# Patient Record
Sex: Female | Born: 1955 | Race: White | Hispanic: No | Marital: Single | Smoking: Never smoker
Health system: Southern US, Community
[De-identification: ages and names within clinical notes are randomized; demographics above are authoritative.]

## PROBLEM LIST (undated history)

## (undated) DIAGNOSIS — R519 Headache, unspecified: Secondary | ICD-10-CM

## (undated) DIAGNOSIS — I959 Hypotension, unspecified: Secondary | ICD-10-CM

## (undated) DIAGNOSIS — F419 Anxiety disorder, unspecified: Secondary | ICD-10-CM

## (undated) DIAGNOSIS — G8929 Other chronic pain: Secondary | ICD-10-CM

## (undated) DIAGNOSIS — K529 Noninfective gastroenteritis and colitis, unspecified: Secondary | ICD-10-CM

## (undated) DIAGNOSIS — R45851 Suicidal ideations: Secondary | ICD-10-CM

## (undated) DIAGNOSIS — R51 Headache: Secondary | ICD-10-CM

## (undated) DIAGNOSIS — G4700 Insomnia, unspecified: Secondary | ICD-10-CM

## (undated) DIAGNOSIS — M1990 Unspecified osteoarthritis, unspecified site: Secondary | ICD-10-CM

## (undated) HISTORY — DX: Noninfective gastroenteritis and colitis, unspecified: K52.9

## (undated) HISTORY — DX: Hypotension, unspecified: I95.9

## (undated) HISTORY — DX: Suicidal ideations: R45.851

## (undated) HISTORY — PX: CARPAL TUNNEL RELEASE: SHX101

## (undated) HISTORY — PX: MOUTH SURGERY: SHX715

## (undated) HISTORY — PX: LAPAROSCOPIC OOPHERECTOMY: SHX6507

## (undated) HISTORY — DX: Insomnia, unspecified: G47.00

## (undated) HISTORY — PX: BACK SURGERY: SHX140

---

## 1999-03-09 ENCOUNTER — Other Ambulatory Visit: Admission: RE | Admit: 1999-03-09 | Discharge: 1999-03-09 | Payer: Self-pay | Admitting: Gynecology

## 1999-03-25 ENCOUNTER — Other Ambulatory Visit: Admission: RE | Admit: 1999-03-25 | Discharge: 1999-03-25 | Payer: Self-pay | Admitting: Gynecology

## 1999-06-03 ENCOUNTER — Other Ambulatory Visit: Admission: RE | Admit: 1999-06-03 | Discharge: 1999-06-03 | Payer: Self-pay | Admitting: Gynecology

## 1999-07-15 ENCOUNTER — Encounter: Payer: Self-pay | Admitting: Emergency Medicine

## 1999-07-15 ENCOUNTER — Emergency Department (HOSPITAL_COMMUNITY): Admission: EM | Admit: 1999-07-15 | Discharge: 1999-07-15 | Payer: Self-pay | Admitting: Emergency Medicine

## 1999-07-16 ENCOUNTER — Encounter: Payer: Self-pay | Admitting: Emergency Medicine

## 1999-07-21 ENCOUNTER — Other Ambulatory Visit: Admission: RE | Admit: 1999-07-21 | Discharge: 1999-07-22 | Payer: Self-pay | Admitting: Gynecology

## 1999-07-22 ENCOUNTER — Encounter (INDEPENDENT_AMBULATORY_CARE_PROVIDER_SITE_OTHER): Payer: Self-pay

## 2000-05-17 ENCOUNTER — Other Ambulatory Visit: Admission: RE | Admit: 2000-05-17 | Discharge: 2000-05-17 | Payer: Self-pay | Admitting: Gynecology

## 2000-05-18 ENCOUNTER — Encounter (INDEPENDENT_AMBULATORY_CARE_PROVIDER_SITE_OTHER): Payer: Self-pay | Admitting: Specialist

## 2000-05-18 ENCOUNTER — Other Ambulatory Visit: Admission: RE | Admit: 2000-05-18 | Discharge: 2000-05-18 | Payer: Self-pay | Admitting: Gynecology

## 2001-07-31 ENCOUNTER — Encounter: Admission: RE | Admit: 2001-07-31 | Discharge: 2001-07-31 | Payer: Self-pay | Admitting: Orthopedic Surgery

## 2001-07-31 ENCOUNTER — Encounter: Payer: Self-pay | Admitting: Orthopedic Surgery

## 2002-01-18 ENCOUNTER — Encounter: Payer: Self-pay | Admitting: Neurosurgery

## 2002-01-18 ENCOUNTER — Inpatient Hospital Stay (HOSPITAL_COMMUNITY): Admission: RE | Admit: 2002-01-18 | Discharge: 2002-01-23 | Payer: Self-pay | Admitting: Neurosurgery

## 2002-01-21 ENCOUNTER — Encounter: Payer: Self-pay | Admitting: Neurosurgery

## 2002-02-25 ENCOUNTER — Encounter: Payer: Self-pay | Admitting: Neurosurgery

## 2002-02-25 ENCOUNTER — Ambulatory Visit (HOSPITAL_COMMUNITY): Admission: RE | Admit: 2002-02-25 | Discharge: 2002-02-25 | Payer: Self-pay | Admitting: Neurosurgery

## 2002-05-07 ENCOUNTER — Encounter: Payer: Self-pay | Admitting: Neurosurgery

## 2002-05-07 ENCOUNTER — Encounter: Admission: RE | Admit: 2002-05-07 | Discharge: 2002-05-07 | Payer: Self-pay | Admitting: Neurosurgery

## 2002-11-29 ENCOUNTER — Ambulatory Visit (HOSPITAL_BASED_OUTPATIENT_CLINIC_OR_DEPARTMENT_OTHER): Admission: RE | Admit: 2002-11-29 | Discharge: 2002-11-29 | Payer: Self-pay | Admitting: Orthopedic Surgery

## 2006-07-30 ENCOUNTER — Emergency Department (HOSPITAL_COMMUNITY): Admission: EM | Admit: 2006-07-30 | Discharge: 2006-07-31 | Payer: Self-pay | Admitting: Emergency Medicine

## 2006-12-31 ENCOUNTER — Emergency Department (HOSPITAL_COMMUNITY): Admission: EM | Admit: 2006-12-31 | Discharge: 2006-12-31 | Payer: Self-pay | Admitting: Family Medicine

## 2009-01-11 ENCOUNTER — Emergency Department (HOSPITAL_COMMUNITY): Admission: EM | Admit: 2009-01-11 | Discharge: 2009-01-11 | Payer: Self-pay | Admitting: Emergency Medicine

## 2009-02-11 ENCOUNTER — Ambulatory Visit (HOSPITAL_COMMUNITY): Admission: RE | Admit: 2009-02-11 | Discharge: 2009-02-11 | Payer: Self-pay | Admitting: Orthopedic Surgery

## 2009-06-10 ENCOUNTER — Ambulatory Visit (HOSPITAL_COMMUNITY): Admission: RE | Admit: 2009-06-10 | Discharge: 2009-06-10 | Payer: Self-pay | Admitting: Orthopedic Surgery

## 2011-01-16 ENCOUNTER — Encounter: Payer: Self-pay | Admitting: Rheumatology

## 2011-01-16 ENCOUNTER — Encounter: Payer: Self-pay | Admitting: Orthopedic Surgery

## 2011-01-16 ENCOUNTER — Encounter: Payer: Self-pay | Admitting: Specialist

## 2011-05-13 NOTE — Op Note (Signed)
[name], Jamie [name]                            ACCOUNT NO.:  [id number]   MEDICAL RECORD NO.:  [id number]                   PATIENT TYPE:  AMB   LOCATION:  DSC                                  FACILITY:  [facility]   PHYSICIAN:  Katy Fitch. Naaman Plummer., M.D.          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   POSTOPERATIVE DIAGNOSES:  Entrapment neuropathy median nerve and right  carpal tunnel.   OPERATION/PROCEDURE:  Release of right transverse carpal ligament.   SURGEON:  Katy Fitch. Sypher, M.D.   ASSISTANT:  Jonni Sanger, P.A.   ANESTHESIA:  General by LMA.   ANESTHESIOLOGIST:  Kaylyn Layer. Michelle Piper, M.D.   INDICATIONS:  [name] is a 55 year old woman who was referred for  evaluation and management of entrapped neuropathy of the right median nerve  at wrist level.  Clinical examination suggested carpal tunnel syndrome and  electrodiagnostic studies confirmed carpal tunnel syndrome.  After informed  consent, she was brought to the operating room at this time for release of  her right transverse carpal ligament.   DESCRIPTION OF PROCEDURE:  [name] was brought to the operating room and  placed in the supine position on the operating table.  Following induction  of general anesthesia, the right arm was prepped with Betadine soap and  solution and sterilely draped.  Following exsanguination of the limb with Esmarch bandage, arterial  tourniquet was inflated to 220 mmHg.  Procedure commenced with a short  incision in line with the ring finger and the palm.  Subcutaneous tissues  were carefully divided within the palmar fascia.  This was split  longitudinally until we reached the transverse carpal space and the median  nerve.  The branches were followed back to the median nerve proper which was  gently  isolated from the transverse carpal ligament.  The ligament was then  released with scissors along the cylinder border, extending the distal  forearm.  This widely opened the carpal canal.  No masses or other  predicaments were noted.  Bleeding points were electrocauterized along the  margins of the released ligament followed by repair of the skin with  intradermal 3-0 Prolene suture.  Compressive dressing was applied and volar  plaster splint was applied with fingers in 5 degrees of dorsiflexion.   DISPOSITION:  For after care, [name] is given a prescription for Percocet  5 mg one or two tablets q.4-6h. p.r.n. pain, 20 capsules without refill.  She will return to the office for followup in one week.  Katy Fitch Naaman Plummer., M.D.    RVS/MEDQ  D:  11/29/2002  T:  11/30/2002  Job:  [id number]

## 2011-05-13 NOTE — Discharge Summary (Signed)
[place]. [facility]  Patient:    Jamie [name], Jamie [name] Visit Number: [id number] MRN: [id number]          Service Type: Attending:  Julio Sicks, M.D. Dictated by:   Julio Sicks, M.D. Adm. Date:  01/18/02 Disc. 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  OPERATIONS AND TREATMENTS:  L5-S1 decompression and fusion surgery with instrumentation.  HISTORY OF PRESENT ILLNESS:  Mrs. Rehmann is a 55 year old female with a history of chronic back and right lower extremity pain, paresthesia and weakness, failing conservative management.  MRI scan demonstrates advanced degenerative disc disease with a chronic right sided L5-S1 calcified disc herniation.  We have discussed options for management and the patient decided to proceed with decompression and fusion surgery in hopes of alleviating some of her symptoms.  HOSPITAL COURSE:  The patient was taken to the operating room and underwent an uncomplicated L5-S1 decompression and fusion surgery was performed. Postoperatively, the patient awakened with a great deal of back pain which was to be expected.  Her lower extremity function was intact.  The patients back pain persisted.  X-rays obtained in the hospital demonstrated good position of the instrumentation and bone grafts and no evidence of complication. She was gradually mobilized with time.  Eventually the patients pain subsided to the point where she could be discharged home.  CONDITION ON DISCHARGE:  Stable.  DISCHARGE MEDICATIONS:  Percocet, OxyContin and Valium. Dictated by:   Julio Sicks, M.D. Attending:  Julio Sicks, M.D. DD:  06/12/02 TD:  06/14/02 Job: [id number]

## 2011-05-13 NOTE — Op Note (Signed)
   [name], Jamie [name]                            ACCOUNT NO.:  [id number]   MEDICAL RECORD NO.:  [id number]                   PATIENT TYPE:  AMB   LOCATION:  DSC                                  FACILITY:  [facility]   PHYSICIAN:  Katy Fitch. Naaman Plummer., M.D.          DATE OF BIRTH:  May 24, 1956   DATE OF PROCEDURE:  11/29/2002  DATE OF DISCHARGE:                                 OPERATIVE REPORT   No dictation.                                               Katy Fitch Naaman Plummer., M.D.    RVS/MEDQ  D:  11/29/2002  T:  11/30/2002  Job:  [id number]

## 2011-05-13 NOTE — Op Note (Signed)
[place]. [facility]  Patient:    Jamie [name], Jamie [name] Visit Number: [id number] MRN: [id number]          Service Type: SUR Location: 3000 [id number] Attending Physician:  Donn Pierini Dictated by:   Julio Sicks, M.D. Proc. 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  PROCEDURES:  L5-S1 laminectomy with foraminotomies, followed by bilateral L5-S1 microdiskectomies.  Posterior lumbar interbody fusion utilizing Tangent wedges and local autograft.  Posterolateral fusion utilizing pedicle screw instrumentation and local autograft.  SURGEON:  Julio Sicks, M.D.  ASSISTANT:  Reinaldo Meeker, M.D.  ANESTHESIA:  General endotracheal.  INDICATIONS:  Ms. Gregg is a 55 year old female with history of chronic back and right lower extremity pain, paresthesias and weaknesses with a right-sided S1 radiculopathy, which has failed all efforts of conservative management. MRI scan demonstrated evidence of severe degenerative disk disease at the L5-S1 level with evidence of a chronic right-sided L5-S1 disk herniation causing compression of the right-sided S1 nerve root.  We have discussed options available for management including both operative and nonoperative care.  The patient has decided to proceed with decompression and fusion surgery in hopes of alleviating some of her symptoms.  DESCRIPTION OF PROCEDURE:  The patient was taken to the operating room, placed on table in supine position.  After adequate level of anesthesia achieved, patient was positioned prone onto a Wilson frame, and appropriately padded. The patients lumbar region was prepped and draped sterilely.  A  #10-blade was used to make a linear skin incision overlying the L4, L5, and S1 levels.  This was carried down sharply in the midline.  Subperiosteal dissection was then performed bilaterally exposing the lamina and facet joints of L4, L5 and S1, as well as the transverse processes of L5 and the sacral ala bilaterally. Deep self-retaining retractor was placed.  Intraoperative fluoroscopy was used and the level was confirmed.  A decompressive laminectomy at L5 and S1 was then performed using Leksell rongeurs, Kerrison rongeurs and the high-speed drill to remove all elements of the lamina of L5, the inferior facets of L5, and partially some of the superior facets of S1.  All bone was cleaned and using later autografting.  The superior rim of the S1 lamina was also removed. The ligamentum flavum was then elevated and resected in piecemeal fashion. The underlying thecal sac and exiting L5 and S1 nerve roots were identified and widely decompressed bilaterally.  The epidural venous plexus was coagulated and cut.  Starting first on the patients right side, microdissection was used to mobilize the right-sided S1 nerve root and underlying disk herniation.  The thecal sac and the S1 nerve root were mobilized towards the midline.  Disk herniation was readily apparent.  It was then incised with a 15-blade.  A wide disk space clean-out was then achieved using pituitary rongeurs, upward-angled pituitary rongeurs and Epstein curets. The procedure was then repeated on the contralateral side.  The disk space was then sequentially dilated up to 9 mm.  With the distractor in place in the patients left side, attention was placed to the right side.  The disk space was then  reamed with a Tangent reamer and then cut with an 8 mm Tangent chisel.  An 8 x 26 mm Tangent wedge was then impacted into place and recessed approximately 2 mm in the posterior cortical margin.  Retractor and distractor were removed.  There  was no evidence of injury to thecal sac or nerve roots. The procedure was then repeated on the patients left side.  Once again with the nerve roots protected, the disk space was then reamed and then cut with an 8 mm chisel.  The interspace was then further prepared by curettage and morcellized autograft was packed into the interspace.  A second Tangent wedge was then impacted into place.  During impaction, the posterior 3 mm of the wedge fractured off.  The bone was cleaned down to a smooth surface.  The bone itself was quite solid.  It was decided to leave this bone graft in place. Final images revealed good position of the bone grafts at the proper operative level.  The pedicles were then isolated at L5 and S1 using surface landmarks and fluoroscopic guidance.  Overlying bone on top of the pedicles were removed using the high-speed drill.  Each pedicle was then probed using a pedicle awe. Each pedicle awl tract was then found to be solidly within bone using a blunt probe.  Each pedicle awl tract was then tapped with a 5.25 mm screw tap.  Each screw tap hole was found to be solidly within bone.  At L5, 6.75 x 45 mm spiral ______ screws were then used bilaterally.  At S1, 6.75 x 40 mm screw was used in the patients left side.  At S1 on the right side, attempts to place a 35 mm screw on the right side were met with screw strippage secondary to abutment to the anterior cortex of the sacrum.  A 7.75 x 30 mm screw was then redirected into the hole and securely embedded into the bone.  The transverse processes of L5 and the sacral ala were then decorticated using the high-speed drill.  Morcellized autograft was packed posterolaterally for later fusion.  A short segment of titanium rods were then contoured and placed through the screw heads at L4 and L5.  Locking caps were placed onto the screw heads.  Locking caps were secured at the S1 level.  The construct was then placed under gentle  compression.  The locking screws were then secured at the L5 level bilaterally.  Final images revealed good position of the bone grafts  and hardware at the operative level with normal alignment of the spine.  The wound was irrigated one final time with antibiotic solution.  Gelfoam was placed topically on thecal sac for hemostasis, which was found to be good.  A medium Hemovac drain was left in the epidural space.  The wound was then closed in layers with Vicryl sutures.  Steri-Strips and a sterile dressing were applied.  There were no apparent complications.  The patient tolerated the procedure well and she returned to recovery room postoperatively. Dictated by:   Julio Sicks, M.D. Attending Physician:  Donn Pierini DD:  01/18/02 TD:  01/19/02 Job: [id number]

## 2012-01-20 ENCOUNTER — Ambulatory Visit (INDEPENDENT_AMBULATORY_CARE_PROVIDER_SITE_OTHER): Payer: BC Managed Care – PPO

## 2012-01-20 DIAGNOSIS — M79609 Pain in unspecified limb: Secondary | ICD-10-CM

## 2012-01-20 DIAGNOSIS — S8000XA Contusion of unspecified knee, initial encounter: Secondary | ICD-10-CM

## 2012-01-20 DIAGNOSIS — S40019A Contusion of unspecified shoulder, initial encounter: Secondary | ICD-10-CM

## 2012-06-20 ENCOUNTER — Other Ambulatory Visit: Payer: Self-pay | Admitting: Family Medicine

## 2012-06-20 DIAGNOSIS — Z1231 Encounter for screening mammogram for malignant neoplasm of breast: Secondary | ICD-10-CM

## 2012-07-05 ENCOUNTER — Ambulatory Visit: Payer: Self-pay

## 2012-07-09 ENCOUNTER — Ambulatory Visit: Payer: Self-pay

## 2012-07-12 ENCOUNTER — Ambulatory Visit: Payer: Self-pay

## 2012-07-25 ENCOUNTER — Ambulatory Visit: Payer: Self-pay

## 2016-05-08 ENCOUNTER — Emergency Department (HOSPITAL_COMMUNITY): Payer: Medicare Other

## 2016-05-08 ENCOUNTER — Encounter (HOSPITAL_COMMUNITY): Payer: Self-pay | Admitting: Emergency Medicine

## 2016-05-08 ENCOUNTER — Ambulatory Visit (HOSPITAL_COMMUNITY)
Admission: EM | Admit: 2016-05-08 | Discharge: 2016-05-08 | Disposition: A | Discharge status: Other Acute Inpatient Hospital | Payer: Medicare Other | Source: Home / Self Care | Attending: Family Medicine | Admitting: Family Medicine

## 2016-05-08 ENCOUNTER — Inpatient Hospital Stay (HOSPITAL_COMMUNITY)
Admission: EM | Admit: 2016-05-08 | Discharge: 2016-05-10 | DRG: 871 | Disposition: A | Discharge status: Home / Self Care | Payer: Medicare Other | Attending: Internal Medicine | Admitting: Internal Medicine

## 2016-05-08 ENCOUNTER — Encounter (HOSPITAL_COMMUNITY): Payer: Self-pay | Admitting: *Deleted

## 2016-05-08 DIAGNOSIS — I959 Hypotension, unspecified: Secondary | ICD-10-CM

## 2016-05-08 DIAGNOSIS — A09 Infectious gastroenteritis and colitis, unspecified: Secondary | ICD-10-CM | POA: Diagnosis present

## 2016-05-08 DIAGNOSIS — A419 Sepsis, unspecified organism: Secondary | ICD-10-CM | POA: Diagnosis not present

## 2016-05-08 DIAGNOSIS — R1031 Right lower quadrant pain: Secondary | ICD-10-CM | POA: Diagnosis present

## 2016-05-08 DIAGNOSIS — E861 Hypovolemia: Secondary | ICD-10-CM | POA: Diagnosis present

## 2016-05-08 DIAGNOSIS — D751 Secondary polycythemia: Secondary | ICD-10-CM | POA: Diagnosis present

## 2016-05-08 DIAGNOSIS — K529 Noninfective gastroenteritis and colitis, unspecified: Secondary | ICD-10-CM

## 2016-05-08 DIAGNOSIS — R7989 Other specified abnormal findings of blood chemistry: Secondary | ICD-10-CM | POA: Diagnosis present

## 2016-05-08 DIAGNOSIS — I9589 Other hypotension: Secondary | ICD-10-CM | POA: Diagnosis present

## 2016-05-08 DIAGNOSIS — R112 Nausea with vomiting, unspecified: Secondary | ICD-10-CM | POA: Diagnosis present

## 2016-05-08 DIAGNOSIS — R002 Palpitations: Secondary | ICD-10-CM | POA: Diagnosis not present

## 2016-05-08 DIAGNOSIS — N170 Acute kidney failure with tubular necrosis: Secondary | ICD-10-CM | POA: Diagnosis present

## 2016-05-08 DIAGNOSIS — F419 Anxiety disorder, unspecified: Secondary | ICD-10-CM | POA: Diagnosis not present

## 2016-05-08 DIAGNOSIS — R197 Diarrhea, unspecified: Secondary | ICD-10-CM

## 2016-05-08 DIAGNOSIS — R1013 Epigastric pain: Secondary | ICD-10-CM

## 2016-05-08 DIAGNOSIS — E872 Acidosis: Secondary | ICD-10-CM | POA: Diagnosis present

## 2016-05-08 DIAGNOSIS — Z79899 Other long term (current) drug therapy: Secondary | ICD-10-CM

## 2016-05-08 DIAGNOSIS — Z886 Allergy status to analgesic agent status: Secondary | ICD-10-CM

## 2016-05-08 DIAGNOSIS — N179 Acute kidney failure, unspecified: Secondary | ICD-10-CM | POA: Diagnosis present

## 2016-05-08 DIAGNOSIS — Z881 Allergy status to other antibiotic agents status: Secondary | ICD-10-CM

## 2016-05-08 DIAGNOSIS — R109 Unspecified abdominal pain: Secondary | ICD-10-CM

## 2016-05-08 DIAGNOSIS — E876 Hypokalemia: Secondary | ICD-10-CM | POA: Diagnosis present

## 2016-05-08 DIAGNOSIS — G43909 Migraine, unspecified, not intractable, without status migrainosus: Secondary | ICD-10-CM | POA: Diagnosis present

## 2016-05-08 DIAGNOSIS — K8020 Calculus of gallbladder without cholecystitis without obstruction: Secondary | ICD-10-CM | POA: Diagnosis present

## 2016-05-08 HISTORY — DX: Anxiety disorder, unspecified: F41.9

## 2016-05-08 HISTORY — DX: Headache: R51

## 2016-05-08 HISTORY — DX: Unspecified osteoarthritis, unspecified site: M19.90

## 2016-05-08 HISTORY — DX: Headache, unspecified: R51.9

## 2016-05-08 LAB — CBC
HCT: 45.3 % (ref 36.0–46.0)
Hemoglobin: 15.4 g/dL — ABNORMAL HIGH (ref 12.0–15.0)
MCH: 31.9 pg (ref 26.0–34.0)
MCHC: 34 g/dL (ref 30.0–36.0)
MCV: 93.8 fL (ref 78.0–100.0)
Platelets: 231 10*3/uL (ref 150–400)
RBC: 4.83 MIL/uL (ref 3.87–5.11)
RDW: 12.8 % (ref 11.5–15.5)
WBC: 10 10*3/uL (ref 4.0–10.5)

## 2016-05-08 LAB — COMPREHENSIVE METABOLIC PANEL
ALT: 28 U/L (ref 14–54)
AST: 33 U/L (ref 15–41)
Albumin: 4.1 g/dL (ref 3.5–5.0)
Alkaline Phosphatase: 69 U/L (ref 38–126)
Anion gap: 11 (ref 5–15)
BUN: 25 mg/dL — ABNORMAL HIGH (ref 6–20)
CO2: 22 mmol/L (ref 22–32)
Calcium: 8.8 mg/dL — ABNORMAL LOW (ref 8.9–10.3)
Chloride: 108 mmol/L (ref 101–111)
Creatinine, Ser: 1.28 mg/dL — ABNORMAL HIGH (ref 0.44–1.00)
GFR calc Af Amer: 51 mL/min — ABNORMAL LOW (ref >60)
GFR calc non Af Amer: 44 mL/min — ABNORMAL LOW (ref >60)
Glucose, Bld: 107 mg/dL — ABNORMAL HIGH (ref 65–99)
Potassium: 3.5 mmol/L (ref 3.5–5.1)
Sodium: 141 mmol/L (ref 135–145)
Total Bilirubin: 1.1 mg/dL (ref 0.3–1.2)
Total Protein: 7.2 g/dL (ref 6.5–8.1)

## 2016-05-08 LAB — I-STAT CG4 LACTIC ACID, ED
Lactic Acid, Venous: 3.23 mmol/L (ref 0.5–2.0)
Lactic Acid, Venous: 1.37 mmol/L (ref 0.5–2.0)

## 2016-05-08 LAB — LIPASE, BLOOD: Lipase: 15 U/L (ref 11–51)

## 2016-05-08 MED ORDER — HYDROMORPHONE HCL 1 MG/ML IJ SOLN
1.0000 mg | INTRAMUSCULAR | Status: DC | PRN
  Administered 2016-05-09 (×3): 1 mg via INTRAVENOUS
  Filled 2016-05-08 (×3): qty 1

## 2016-05-08 MED ORDER — ENOXAPARIN SODIUM 40 MG/0.4ML ~~LOC~~ SOLN
40.0000 mg | Freq: Every day | SUBCUTANEOUS | Status: DC
  Administered 2016-05-09 (×2): 40 mg via SUBCUTANEOUS
  Filled 2016-05-08: qty 0.4

## 2016-05-08 MED ORDER — POTASSIUM CHLORIDE IN NACL 20-0.9 MEQ/L-% IV SOLN
INTRAVENOUS | Status: DC
  Administered 2016-05-09: 01:00:00 via INTRAVENOUS
  Filled 2016-05-08 (×3): qty 1000

## 2016-05-08 MED ORDER — SODIUM CHLORIDE 0.9 % IV BOLUS (SEPSIS)
1000.0000 mL | Freq: Once | INTRAVENOUS | Status: AC
  Administered 2016-05-08: 1000 mL via INTRAVENOUS

## 2016-05-08 MED ORDER — FENTANYL CITRATE (PF) 100 MCG/2ML IJ SOLN
50.0000 ug | Freq: Once | INTRAMUSCULAR | Status: AC
  Administered 2016-05-08: 50 ug via INTRAVENOUS
  Filled 2016-05-08: qty 2

## 2016-05-08 MED ORDER — ALPRAZOLAM 1 MG PO TABS
1.0000 mg | ORAL_TABLET | Freq: Four times a day (QID) | ORAL | Status: DC | PRN
  Administered 2016-05-09: 1 mg via ORAL
  Filled 2016-05-08: qty 1

## 2016-05-08 MED ORDER — ADULT MULTIVITAMIN W/MINERALS CH
1.0000 | ORAL_TABLET | Freq: Every day | ORAL | Status: DC
  Administered 2016-05-09: 1 via ORAL
  Filled 2016-05-08 (×2): qty 1

## 2016-05-08 MED ORDER — SACCHAROMYCES BOULARDII 250 MG PO CAPS
250.0000 mg | ORAL_CAPSULE | Freq: Two times a day (BID) | ORAL | Status: DC
  Administered 2016-05-09 – 2016-05-10 (×4): 250 mg via ORAL
  Filled 2016-05-08 (×4): qty 1

## 2016-05-08 MED ORDER — ACETAMINOPHEN 325 MG PO TABS
650.0000 mg | ORAL_TABLET | Freq: Four times a day (QID) | ORAL | Status: DC | PRN
  Filled 2016-05-08: qty 2

## 2016-05-08 MED ORDER — METOCLOPRAMIDE HCL 10 MG PO TABS: 10.0000 mg | ORAL_TABLET | Freq: Four times a day (QID) | ORAL | Status: DC | PRN

## 2016-05-08 MED ORDER — SODIUM CHLORIDE 0.9% FLUSH: 3.0000 mL | Freq: Two times a day (BID) | INTRAVENOUS | Status: DC

## 2016-05-08 MED ORDER — IOPAMIDOL (ISOVUE-300) INJECTION 61%
100.0000 mL | Freq: Once | INTRAVENOUS | Status: AC | PRN
  Administered 2016-05-08: 80 mL via INTRAVENOUS

## 2016-05-08 MED ORDER — ONDANSETRON HCL 4 MG/2ML IJ SOLN
4.0000 mg | Freq: Four times a day (QID) | INTRAMUSCULAR | Status: DC | PRN
  Administered 2016-05-09 – 2016-05-10 (×3): 4 mg via INTRAVENOUS
  Filled 2016-05-08 (×3): qty 2

## 2016-05-08 MED ORDER — DIPHENHYDRAMINE HCL 50 MG/ML IJ SOLN
12.5000 mg | Freq: Once | INTRAMUSCULAR | Status: AC
  Administered 2016-05-08: 12.5 mg via INTRAVENOUS
  Filled 2016-05-08: qty 1

## 2016-05-08 MED ORDER — BUTALBITAL-APAP-CAFFEINE 50-325-40 MG PO TABS
2.0000 | ORAL_TABLET | ORAL | Status: DC | PRN
  Administered 2016-05-09 – 2016-05-10 (×3): 2 via ORAL
  Filled 2016-05-08 (×4): qty 2

## 2016-05-08 MED ORDER — BUTALBITAL-APAP-CAFFEINE 50-325-40 MG PO TABS
2.0000 | ORAL_TABLET | Freq: Once | ORAL | Status: AC
  Administered 2016-05-08: 2 via ORAL
  Filled 2016-05-08: qty 2

## 2016-05-08 MED ORDER — SODIUM CHLORIDE 0.9 % IV SOLN: Freq: Once | INTRAVENOUS | Status: DC

## 2016-05-08 MED ORDER — PIPERACILLIN-TAZOBACTAM 3.375 G IVPB
3.3750 g | Freq: Once | INTRAVENOUS | Status: AC
  Administered 2016-05-08: 3.375 g via INTRAVENOUS
  Filled 2016-05-08: qty 50

## 2016-05-08 MED ORDER — ONDANSETRON HCL 4 MG/2ML IJ SOLN
4.0000 mg | Freq: Once | INTRAMUSCULAR | Status: AC
  Administered 2016-05-08: 4 mg via INTRAVENOUS
  Filled 2016-05-08: qty 2

## 2016-05-08 MED ORDER — ONDANSETRON HCL 4 MG PO TABS
4.0000 mg | ORAL_TABLET | Freq: Four times a day (QID) | ORAL | Status: DC | PRN
  Administered 2016-05-09: 4 mg via ORAL
  Filled 2016-05-08: qty 1

## 2016-05-08 MED ORDER — HYDROCODONE-ACETAMINOPHEN 5-325 MG PO TABS
1.0000 | ORAL_TABLET | ORAL | Status: DC | PRN
  Administered 2016-05-10: 1 via ORAL
  Filled 2016-05-08: qty 1

## 2016-05-08 MED ORDER — PIPERACILLIN-TAZOBACTAM 3.375 G IVPB
3.3750 g | Freq: Three times a day (TID) | INTRAVENOUS | Status: DC
  Administered 2016-05-09 (×2): 3.375 g via INTRAVENOUS
  Filled 2016-05-08 (×2): qty 50

## 2016-05-08 MED ORDER — ACETAMINOPHEN 650 MG RE SUPP: 650.0000 mg | Freq: Four times a day (QID) | RECTAL | Status: DC | PRN

## 2016-05-08 MED ORDER — METOCLOPRAMIDE HCL 5 MG/ML IJ SOLN
10.0000 mg | Freq: Once | INTRAMUSCULAR | Status: AC
  Administered 2016-05-08: 10 mg via INTRAVENOUS
  Filled 2016-05-08: qty 2

## 2016-05-08 NOTE — ED Notes (Signed)
MD at bedside. 

## 2016-05-08 NOTE — ED Notes (Signed)
Provider at bedside

## 2016-05-08 NOTE — Discharge Instructions (Signed)
Hypotension As your heart beats, it forces blood through your body. This force is called blood pressure. If you have hypotension, you have low blood pressure. When your blood pressure is too low, you may not get enough blood to your brain. You may feel weak, feel lightheaded, have a fast heartbeat, or even pass out (faint). HOME CARE  Drink enough fluids to keep your pee (urine) clear or pale yellow.  Take all medicines as told by your doctor.  Get up slowly after sitting or lying down.  Wear support stockings as told by your doctor.  Maintain a healthy diet by including foods such as fruits, vegetables, nuts, whole grains, and lean meats. GET HELP IF:  You are throwing up (vomiting) or have watery poop (diarrhea).  You have a fever for more than 2-3 days.  You feel more thirsty than usual.  You feel weak and tired. GET HELP RIGHT AWAY IF:   You pass out (faint).  You have chest pain or a fast or irregular heartbeat.  You lose feeling in part of your body.  You cannot move your arms or legs.  You have trouble speaking.  You get sweaty or feel lightheaded. MAKE SURE YOU:   Understand these instructions.  Will watch your condition.  Will get help right away if you are not doing well or get worse.   This information is not intended to replace advice given to you by your health care provider. Make sure you discuss any questions you have with your health care provider.   Document Released: 03/08/2010 Document Revised: 08/14/2013 Document Reviewed: 06/14/2013 Elsevier Interactive Patient Education 2016 [employer]  

## 2016-05-08 NOTE — ED Notes (Addendum)
Pt. Made aware for the need of urine. 

## 2016-05-08 NOTE — ED Notes (Signed)
Unable to complete EKG prior to transfer

## 2016-05-08 NOTE — ED Provider Notes (Signed)
CSN: [id number]     Arrival date & time 05/08/16  1721 History   First MD Initiated Contact with Patient 05/08/16 1735     Chief Complaint  Patient presents with  . N/V/D      The history is provided by the patient and a relative. No language interpreter was used.   Jamie [name] is a 60 y.o. female who presents to the Emergency Department complaining of nausea, vomiting, diarrhea.  Yesterday she developed nausea, vomiting, diarrhea. She reports innumerable episodes of vomiting and diarrhea with watery stools. Denies any hematochezia, hematemesis, melena. She does have associated headache today as well as upper abdominal pain. She had a temperature of 99.2 at home. She has a history of nervous stomach and does easily get vomiting and diarrhea. No recent antibiotic use. She has been having trouble with feeling as if she is going to pass out today as well as ringing in her ears and cramping of her legs. Symptoms are severe, constant, worsening.  Past Medical History  Diagnosis Date  . Arthritis   . Headache   . Anxiety    History reviewed. No pertinent past surgical history. Family History  Problem Relation Age of Onset  . Colon cancer Neg Hx    Social History  Substance Use Topics  . Smoking status: Never Smoker   . Smokeless tobacco: None  . Alcohol Use: No   OB History    No data available     Review of Systems  All other systems reviewed and are negative.     Allergies  Azithromycin and Nsaids  Home Medications   Prior to Admission medications   Medication Sig Start Date End Date Taking? Authorizing Provider  ALPRAZolam Prudy Feeler) 1 MG tablet Take 1 mg by mouth 4 (four) times daily as needed for anxiety or sleep. An additional 2mg  at bedtime for sleep   Yes Historical Provider, MD  Ascorbic Acid (VITAMIN C PO) Take 1 capsule by mouth daily.   Yes Historical Provider, MD  butalbital-acetaminophen-caffeine (FIORICET, ESGIC) 50-325-40 MG tablet Take 2 tablets by mouth  every 4 (four) hours as needed for migraine.  05/04/16  Yes Historical Provider, MD  Calcium Carb-Cholecalciferol (CALCIUM + D3 PO) Take 1 capsule by mouth daily.   Yes Historical Provider, MD  Cyanocobalamin (VITAMIN B 12 PO) Take 1 capsule by mouth daily.   Yes Historical Provider, MD  metoCLOPramide (REGLAN) 10 MG tablet Take 10 mg by mouth every 6 (six) hours as needed for nausea or vomiting.   Yes Historical Provider, MD  Multiple Vitamin (MULTIVITAMIN) tablet Take 1 tablet by mouth daily.   Yes Historical Provider, MD  saccharomyces boulardii (FLORASTOR) 250 MG capsule Take 250 mg by mouth 2 (two) times daily.   Yes Historical Provider, MD   BP 121/52 mmHg  Pulse 56  Temp(Src) 98.7 F (37.1 C) (Oral)  Resp 18  Ht 5\' 4"  (1.626 m)  Wt 141 lb (63.957 kg)  BMI 24.19 kg/m2  SpO2 98% Physical Exam  Constitutional: She is oriented to person, place, and time. She appears well-developed and well-nourished.  HENT:  Head: Normocephalic and atraumatic.  Cardiovascular: Normal rate and regular rhythm.   No murmur heard. Pulmonary/Chest: Effort normal and breath sounds normal. No respiratory distress.  Abdominal: Soft. There is no rebound and no guarding.  Mild to moderate diffuse abdominal tenderness, greatest over the left lower quadrant  Musculoskeletal: She exhibits no edema or tenderness.  Neurological: She is alert and oriented to person,  place, and time.  Skin: Skin is warm and dry.  Psychiatric: She has a normal mood and affect. Her behavior is normal.  Nursing note and vitals reviewed.   ED Course  Procedures (including critical care time) Labs Review Labs Reviewed  COMPREHENSIVE METABOLIC PANEL - Abnormal; Notable for the following:    Glucose, Bld 107 (*)    BUN 25 (*)    Creatinine, Ser 1.28 (*)    Calcium 8.8 (*)    GFR calc non Af Amer 44 (*)    GFR calc Af Amer 51 (*)    All other components within normal limits  CBC - Abnormal; Notable for the following:     Hemoglobin 15.4 (*)    All other components within normal limits  URINALYSIS, ROUTINE W REFLEX MICROSCOPIC (NOT AT [facility]) - Abnormal; Notable for the following:    Specific Gravity, Urine 1.036 (*)    Hgb urine dipstick TRACE (*)    All other components within normal limits  APTT - Abnormal; Notable for the following:    aPTT 38 (*)    All other components within normal limits  CBC WITH DIFFERENTIAL/PLATELET - Abnormal; Notable for the following:    Lymphs Abs 0.6 (*)    All other components within normal limits  I-STAT CG4 LACTIC ACID, ED - Abnormal; Notable for the following:    Lactic Acid, Venous 3.23 (*)    All other components within normal limits  CULTURE, BLOOD (ROUTINE X 2)  CULTURE, BLOOD (ROUTINE X 2)  URINE CULTURE  GASTROINTESTINAL PANEL BY PCR, STOOL (REPLACES STOOL CULTURE)  LIPASE, BLOOD  LACTIC ACID, PLASMA  PROCALCITONIN  PROTIME-INR  URINE MICROSCOPIC-ADD ON  LACTIC ACID, PLASMA  BASIC METABOLIC PANEL  I-STAT CG4 LACTIC ACID, ED    Imaging Review Ct Abdomen Pelvis W Contrast  05/08/2016  CLINICAL DATA:  60 year old female with right lower quadrant abdominal pain EXAM: CT ABDOMEN AND PELVIS WITH CONTRAST TECHNIQUE: Multidetector CT imaging of the abdomen and pelvis was performed using the standard protocol following bolus administration of intravenous contrast. CONTRAST:  73mL ISOVUE-300 IOPAMIDOL (ISOVUE-300) INJECTION 61% COMPARISON:  Abdominal radiograph dated 07/30/2006 FINDINGS: There bibasilar linear atelectasis/scarring. No intra-abdominal free air or free fluid. The hepatomegaly measuring 18 cm in midclavicular length. Mild dilatation of the intrahepatic biliary tree. Small amount of layering stone noted within the gallbladder. The gallbladder is mildly distended. No pericholecystic fluid. Ultrasound may provide better evaluation of the gallbladder if clinically indicated. The pancreas, spleen, adrenal glands, kidneys, visualized ureters, and urinary bladder  appear unremarkable. The uterus is grossly unremarkable. Linear metallic density in the right hemipelvis lateral to the uterus may be related to prior tubal occlusive device or bowel resection. Surgical metallic density noted in the left hemipelvis. There is no evidence of bowel obstruction or active inflammation. The appendix is not visualized with certainty. No inflammatory changes identified in the right lower quadrant. The abdominal aorta and IVC appear unremarkable. No portal venous gas identified. There is no adenopathy. There is mild diffuse subcutaneous soft tissue stranding. There is a small fat containing umbilical hernia. There is osteopenia with degenerative changes of the spine. L5-S1 posterior fusion hardware and laminectomy changes. No acute fracture. IMPRESSION: No evidence of bowel obstruction or active inflammation. No CT evidence to suggest acute appendicitis. Hepatomegaly. Cholelithiasis. Ultrasound may provide better evaluation of the gallbladder. Electronically Signed   By: Elgie Collard M.D.   On: 05/08/2016 19:59   Dg Chest Port 1 View  05/08/2016  CLINICAL  DATA:  Fever, nausea, vomiting, and diarrhea starting last night. Dizziness, palpitations, cramping, abdominal discomfort, and headache. Nonsmoker. EXAM: PORTABLE CHEST 1 VIEW COMPARISON:  01/11/2009 FINDINGS: Shallow inspiration with elevation the left hemidiaphragm. Linear atelectasis in the lung bases. No focal airspace disease or consolidation in the lungs. No blunting of costophrenic angles. No pneumothorax. Normal heart size and pulmonary vascularity. Tortuous aorta. IMPRESSION: Shallow inspiration with linear atelectasis in the lung bases. Electronically Signed   By: Burman Nieves M.D.   On: 05/08/2016 23:05   US Abdomen Limited Ruq  05/08/2016  CLINICAL DATA:  60 year old female with right upper quadrant abdominal pain EXAM: US ABDOMEN LIMITED - RIGHT UPPER QUADRANT COMPARISON:  CT dated 05/08/2016 FINDINGS:  Gallbladder: Multiple small stones noted within the gallbladder. There is no gallbladder wall thickening or pericholecystic fluid. Common bile duct: Diameter: 5 mm proximally up to 9 mm in the midportion of the CBD. Liver: No focal lesion identified. Within normal limits in parenchymal echogenicity. Mild intrahepatic biliary ductal dilatation. IMPRESSION: Cholelithiasis without sonographic evidence of acute cholecystitis. Electronically Signed   By: Elgie Collard M.D.   On: 05/08/2016 23:13   I have personally reviewed and evaluated these images and lab results as part of my medical decision-making.   EKG Interpretation None      MDM   Final diagnoses:  Abdominal pain    Patient here for evaluation of vomiting and diarrhea. She was significantly hypotensive with pressures of 55/35 prior to ED arrival. She has significant left lower quadrant tenderness on examination but CT scan is negative for diverticulitis. During her emergency department stay the patient did develop a fever to 101.4. She was treated for possible diverticulitis with zosyn given her lactic acidosis and hypotension. Her lactate did clear during her ED stay and her blood pressure remained improved. Admit to the medicine service for further observation for possible sepsis.    Tilden Fossa, MD 05/09/16 [phone]

## 2016-05-08 NOTE — ED Notes (Signed)
EMS here to transfer patient, per request [facility] is going to [facility]. [facility], jewellery given report.

## 2016-05-08 NOTE — H&P (Signed)
History and Physical    Jamie [name] UXL:[id number] DOB: 07-22-1956 DOA: 05/08/2016  PCP: Pamelia Hoit, MD   Patient coming from: Home  Chief Complaint: Nausea, vomiting, diarrhea  HPI: Jamie [name] is a 60 y.o. female with medical history significant for arthritis, migraines, and anxiety who presents to the ED with nausea, vomiting, and diarrhea of one-day duration. Patient reports chronic GI upset for which she takes Reglan at home, but was in her usual state of fairly good health until last night when she developed crampy central abdominal pain with nausea, nonbloody vomiting, and watery diarrhea. Patient denies any recent long distance travel or sick contacts. She denied any fevers, chills, dysuria, or flank pain. She reports lightheadedness and palpitations associated with this illness. She took Imodium yesterday with some improvement in symptoms. There is been no recent change in her medications or diet. She went to an urgent care this evening for evaluation and was found to have blood pressure 55/35. She was bolused with 1 L of normal saline and SBP rose to 70. She was transported to [facility] ED for further evaluation.  ED Course: Upon arrival to the ED, patient is found to be afebrile initially, later spiking a temperature to 38.6 C. Blood pressure was in the 90/60 range on arrival with remaining vitals stable. Lactic acid was 3.23 initially, improving to 1.37 after a 30 cc/kg normal saline bolus. CMP features a serum creatinine 1.28 with no prior for comparison. CBC is notable for a hemoglobin of 15.4. Chest x-ray demonstrates atelectasis in the bases with low inspiratory volume. CT of the abdomen was obtained and negative for SBO, inflammatory process, or acute appendicitis. Cholelithiasis was noted on the CT and further evaluated with abdominal ultrasound which does not feature any evidence of acute cholecystitis. Blood cultures were obtained and empiric treatment was  started with Zosyn. Patient developed a migraine headache while in the ED, relating that the symptoms are consistent with her chronic migraines. Symptomatic management was provided with fentanyl, Reglan, Benadryl, and Fioricet. Blood pressure improved with the fluid bolus and the patient remained hemodynamically stable in the emergency department. She'll be admitted for ongoing evaluation and management of acute infectious nausea, vomiting, and diarrhea.  Review of Systems:  All other systems reviewed and apart from HPI, are negative.  Past Medical History  Diagnosis Date  . Arthritis   . Headache   . Anxiety     History reviewed. No pertinent past surgical history.   reports that she has never smoked. She does not have any smokeless tobacco history on file. She reports that she does not drink alcohol. Her drug history is not on file.  Allergies  Allergen Reactions  . Azithromycin Nausea And Vomiting  . Nsaids Other (See Comments)    Stomach problems    Family History  Problem Relation Age of Onset  . Colon cancer Neg Hx      Prior to Admission medications   Medication Sig Start Date End Date Taking? Authorizing Provider  ALPRAZolam Prudy Feeler) 1 MG tablet Take 1 mg by mouth 4 (four) times daily as needed for anxiety or sleep. An additional 2mg  at bedtime for sleep   Yes Historical Provider, MD  Ascorbic Acid (VITAMIN C PO) Take 1 capsule by mouth daily.   Yes Historical Provider, MD  butalbital-acetaminophen-caffeine (FIORICET, ESGIC) 50-325-40 MG tablet Take 2 tablets by mouth every 4 (four) hours as needed for migraine.  05/04/16  Yes Historical Provider, MD  Calcium Carb-Cholecalciferol (CALCIUM +  D3 PO) Take 1 capsule by mouth daily.   Yes Historical Provider, MD  Cyanocobalamin (VITAMIN B 12 PO) Take 1 capsule by mouth daily.   Yes Historical Provider, MD  metoCLOPramide (REGLAN) 10 MG tablet Take 10 mg by mouth every 6 (six) hours as needed for nausea or vomiting.   Yes  Historical Provider, MD  Multiple Vitamin (MULTIVITAMIN) tablet Take 1 tablet by mouth daily.   Yes Historical Provider, MD  saccharomyces boulardii (FLORASTOR) 250 MG capsule Take 250 mg by mouth 2 (two) times daily.   Yes Historical Provider, MD    Physical Exam: Filed Vitals:   05/08/16 1739 05/08/16 1830 05/08/16 2113 05/08/16 2137  BP: 92/63 108/56 110/50   Pulse: 86 93 92   Temp: 98.3 F (36.8 C)  101.4 F (38.6 C)   TempSrc: Oral  Oral   Resp: 13 14 14    Height:    5\' 5"  (1.651 m)  Weight:    61.236 kg (135 lb)  SpO2: 98% 96% 95%       Constitutional: NAD, calm, in obvious discomfort  Eyes: PERTLA, lids and conjunctivae normal ENMT: Mucous membranes are dry. Posterior pharynx clear of any exudate or lesions.   Neck: normal, supple, no masses, no thyromegaly Respiratory: clear to auscultation bilaterally, no wheezing, no crackles. Normal respiratory effort. No accessory muscle use.  Cardiovascular: S1 & S2 heard, regular rate and rhythm, no significant murmurs / rubs / gallops. No extremity edema. 2+ pedal pulses.   Abdomen: No distension, tender in epigastrum, no masses palpated. Bowel sounds normal.  Musculoskeletal: no clubbing / cyanosis. No joint deformity upper and lower extremities. Normal muscle tone.  Skin: no significant rashes, lesions, ulcers. Warm, dry, well-perfused. Neurologic: CN 2-12 grossly intact. Sensation intact, DTR normal. Strength 5/5 in all 4 limbs.  Psychiatric: Normal judgment and insight. Alert and oriented x 3. Normal mood and affect.     Labs on Admission: I have personally reviewed following labs and imaging studies  CBC:  Recent Labs Lab 05/08/16 1743  WBC 10.0  HGB 15.4*  HCT 45.3  MCV 93.8  PLT 231   Basic Metabolic Panel:  Recent Labs Lab 05/08/16 1743  NA 141  K 3.5  CL 108  CO2 22  GLUCOSE 107*  BUN 25*  CREATININE 1.28*  CALCIUM 8.8*   GFR: Estimated Creatinine Clearance: 41.5 mL/min (by C-G formula based on  Cr of 1.28). Liver Function Tests:  Recent Labs Lab 05/08/16 1743  AST 33  ALT 28  ALKPHOS 69  BILITOT 1.1  PROT 7.2  ALBUMIN 4.1    Recent Labs Lab 05/08/16 1743  LIPASE 15   No results for input(s): AMMONIA in the last 168 hours. Coagulation Profile: No results for input(s): INR, PROTIME in the last 168 hours. Cardiac Enzymes: No results for input(s): CKTOTAL, CKMB, CKMBINDEX, TROPONINI in the last 168 hours. BNP (last 3 results) No results for input(s): PROBNP in the last 8760 hours. HbA1C: No results for input(s): HGBA1C in the last 72 hours. CBG: No results for input(s): GLUCAP in the last 168 hours. Lipid Profile: No results for input(s): CHOL, HDL, LDLCALC, TRIG, CHOLHDL, LDLDIRECT in the last 72 hours. Thyroid Function Tests: No results for input(s): TSH, T4TOTAL, FREET4, T3FREE, THYROIDAB in the last 72 hours. Anemia Panel: No results for input(s): VITAMINB12, FOLATE, FERRITIN, TIBC, IRON, RETICCTPCT in the last 72 hours. Urine analysis: No results found for: COLORURINE, APPEARANCEUR, LABSPEC, PHURINE, GLUCOSEU, HGBUR, BILIRUBINUR, KETONESUR, PROTEINUR, UROBILINOGEN, NITRITE, LEUKOCYTESUR Sepsis  Labs: (procalcitonin:4,lacticidven:4) )No results found for this or any previous visit (from the past 240 hour(s)).   Radiological Exams on Admission: Ct Abdomen Pelvis W Contrast  05/08/2016  CLINICAL DATA:  60 year old female with right lower quadrant abdominal pain EXAM: CT ABDOMEN AND PELVIS WITH CONTRAST TECHNIQUE: Multidetector CT imaging of the abdomen and pelvis was performed using the standard protocol following bolus administration of intravenous contrast. CONTRAST:  80mL ISOVUE-300 IOPAMIDOL (ISOVUE-300) INJECTION 61% COMPARISON:  Abdominal radiograph dated 07/30/2006 FINDINGS: There bibasilar linear atelectasis/scarring. No intra-abdominal free air or free fluid. The hepatomegaly measuring 18 cm in midclavicular length. Mild dilatation of the  intrahepatic biliary tree. Small amount of layering stone noted within the gallbladder. The gallbladder is mildly distended. No pericholecystic fluid. Ultrasound may provide better evaluation of the gallbladder if clinically indicated. The pancreas, spleen, adrenal glands, kidneys, visualized ureters, and urinary bladder appear unremarkable. The uterus is grossly unremarkable. Linear metallic density in the right hemipelvis lateral to the uterus may be related to prior tubal occlusive device or bowel resection. Surgical metallic density noted in the left hemipelvis. There is no evidence of bowel obstruction or active inflammation. The appendix is not visualized with certainty. No inflammatory changes identified in the right lower quadrant. The abdominal aorta and IVC appear unremarkable. No portal venous gas identified. There is no adenopathy. There is mild diffuse subcutaneous soft tissue stranding. There is a small fat containing umbilical hernia. There is osteopenia with degenerative changes of the spine. L5-S1 posterior fusion hardware and laminectomy changes. No acute fracture. IMPRESSION: No evidence of bowel obstruction or active inflammation. No CT evidence to suggest acute appendicitis. Hepatomegaly. Cholelithiasis. Ultrasound may provide better evaluation of the gallbladder. Electronically Signed   By: Elgie Collard M.D.   On: 05/08/2016 19:59   Dg Chest Port 1 View  05/08/2016  CLINICAL DATA:  Fever, nausea, vomiting, and diarrhea starting last night. Dizziness, palpitations, cramping, abdominal discomfort, and headache. Nonsmoker. EXAM: PORTABLE CHEST 1 VIEW COMPARISON:  01/11/2009 FINDINGS: Shallow inspiration with elevation the left hemidiaphragm. Linear atelectasis in the lung bases. No focal airspace disease or consolidation in the lungs. No blunting of costophrenic angles. No pneumothorax. Normal heart size and pulmonary vascularity. Tortuous aorta. IMPRESSION: Shallow inspiration with linear  atelectasis in the lung bases. Electronically Signed   By: Burman Nieves M.D.   On: 05/08/2016 23:05    EKG:  Not performed, will obtain as appropriate  Assessment/Plan  1. Nausea, vomiting, & diarrhea, suspected infectious  - Etiology remains uncertain despite evaluation with CT and [place]  - Fever and elevated procalcitonin are suggestive of infectious process  - Empiric treatment with Zosyn initiated in ED, will continue for now given elevated procalcitonin - Blood cultures incubating  - Check GI pathogen panel  - Infection-control with enteric precautions  - Replacing fluid and electrolyte losses  - Symptom-management with Reglan, Zofran, Compazine prn   2. Kidney disease of uncertain chronicity  - SCr is 1.28 on admission, BUN 25, no prior for comparison  - Prerenal azotemia or ATN certainly possible given hypotension prior to IVF  - Anticipate improvement with fluid resuscitation   - Avoid nephrotoxins where possible  - Repeat chem panel in am  - No structural abnormality on CT  3. Hypotension  - BP 55/35 in urgent care just prior to arrival  - Secondary to hypovolemia in setting of vomiting and diarrhea; infection contributing  - Improved with 30 cc/kg NS bolus in ED and has remained stable  4. Anxiety  - Stable  - Continue home-dose Xanax prn    5. Migraine - Developed one in ED; reports sxs as typical of her migraine in quality and severity - No focal neurologic deficit on exam  - Treat with home-dose Fioricet  6. Polycythemia - Hgb 15.4 on admission, likely d/t hemoconcentration  - Anticipate normalization with fluid resuscitation    DVT prophylaxis: sq Lovenox  Code Status: Full  Family Communication: Daughter at bedside  Disposition Plan: Observe on telemetry   Consults called: None  Admission status: Observation    Briscoe Deutscher, MD [facility] Pager [phone]  If 7PM-7AM, please contact night-coverage [email or web address] Password  [facility]  05/08/2016, 11:09 PM

## 2016-05-08 NOTE — ED Notes (Signed)
Patient reports nausea, vomiting, and diarrhea that started last night accompanied with dizziness, palpitations, cramping, abdominal discomfort and headache. On arrival patient very weak with decreased vision. Daughter states on the way to UC patient could tell it was light outside but could not tell her what color the car was in front of her. Patient BP per machine in 50s, manual BP reading 70. MD Eniola notified of patients complaints and BP. IV established.

## 2016-05-08 NOTE — ED Notes (Signed)
Per EMS-was picked up at [facility] for low BP-states N/V/D since yesterday-states she took Imodium and it helped-last time she vomited was 0700

## 2016-05-08 NOTE — ED Notes (Signed)
Patient to be transferred to [facility] ED, patient and family verbalized understanding

## 2016-05-08 NOTE — ED Notes (Signed)
PT REFUSED IN & OUT CATH FOR URINE COLLECTION. DR. Madilyn Hook MADE AWARE.

## 2016-05-08 NOTE — ED Provider Notes (Signed)
CSN: [id number]     Arrival date & time 05/08/16  1600 History   First MD Initiated Contact with Patient 05/08/16 1633     Chief Complaint  Patient presents with  . Emesis  . Diarrhea  . Dizziness  . Headache   (Consider location/radiation/quality/duration/timing/severity/associated sxs/prior Treatment) Patient is a 60 y.o. female presenting with dizziness and diarrhea. The history is provided by the patient. No language interpreter was used.  Dizziness Quality:  Lightheadedness and imbalance Severity:  Moderate Timing:  Intermittent Progression:  Unchanged Chronicity:  New Context: standing up   Context: not when bending over, not with bowel movement, not with eye movement, not with inactivity and not with loss of consciousness   Associated symptoms: diarrhea, headaches, nausea, palpitations and vomiting   Associated symptoms: no blood in stool, no chest pain and no shortness of breath   Diarrhea Quality:  Watery Severity:  Moderate Duration:  24 hours Timing:  Constant Progression:  Worsening (She has had 6 watery bowel movement since yesterday. Last BM this morning) Relieved by:  Nothing Worsened by:  Nothing tried Associated symptoms: abdominal pain, fever, headaches and vomiting   Associated symptoms: no diaphoresis   Associated symptoms comment:  She started vomiting yesterday and she stated she vomited all night long. She is more lethargic in the last few hours Risk factors: no recent antibiotic use, no sick contacts, no suspicious food intake and no travel to endemic areas     Past Medical History  Diagnosis Date  . Arthritis    History reviewed. No pertinent past surgical history. History reviewed. No pertinent family history. Social History  Substance Use Topics  . Smoking status: Never Smoker   . Smokeless tobacco: None  . Alcohol Use: None   OB History    No data available     Review of Systems  Constitutional: Positive for fever. Negative for  diaphoresis.  Respiratory: Negative.  Negative for shortness of breath.   Cardiovascular: Positive for palpitations. Negative for chest pain.  Gastrointestinal: Positive for nausea, vomiting, abdominal pain and diarrhea. Negative for blood in stool.  Genitourinary: Negative.   Neurological: Positive for dizziness and headaches.  All other systems reviewed and are negative.   Allergies  Review of patient's allergies indicates no known allergies.  Home Medications   Prior to Admission medications   Not on File   Meds Ordered and Administered this Visit   Medications  sodium chloride 0.9 % bolus 1,000 mL (1,000 mLs Intravenous Given 05/08/16 1627)    BP 70/30 mmHg  Pulse 75  Temp(Src) 98.9 F (37.2 C) (Oral)  Resp 12  SpO2 96% No data found.   Physical Exam  Constitutional: She is oriented to person, place, and time. She appears well-developed.  Moderately dehydrated  Cardiovascular: Normal rate, regular rhythm and normal heart sounds.   No murmur heard. Pulmonary/Chest: Effort normal and breath sounds normal. No respiratory distress. She has no wheezes.  Abdominal: Soft. Bowel sounds are normal. She exhibits no distension and no mass. There is tenderness. There is no rebound and no guarding.  ++ left lower quadrant tenderness  Neurological: She is oriented to person, place, and time.  Lethargic but arouseble.  Skin: She is not diaphoretic. No erythema. There is pallor.  Nursing note and vitals reviewed.   ED Course  Procedures (including critical care time)  Labs Review Labs Reviewed - No data to display  Imaging Review No results found.   Visual Acuity Review  Right Eye Distance:  Left Eye Distance:   Bilateral Distance:    Right Eye Near:   Left Eye Near:    Bilateral Near:      Filed Vitals:   05/08/16 1618 05/08/16 1628 05/08/16 1644  BP: 55/35 70/30 64/37   Pulse: 75    Temp: 98.9 F (37.2 C)    TempSrc: Oral    Resp: 12    SpO2: 96%         MDM  No diagnosis found. Hypotension, unspecified hypotension type  Gastroenteritis  Palpitation   Hypotension likely due to dehydration from diarrhea and vomiting.  1 L bolus normal saline given and BP was rechecked about 30 min later with no improvement.  Patient endorsed palpitation with no chest pain. Cardiac exam normal. Likely intermittent palpitation. EKG ordered but was unable to get it done before EMS arrived. She will get this done at the ED. As discussed with patient, I will recommended hospital observation and management. Patient agreed to go to the ED for further management.     , MD 05/08/16 [phone]

## 2016-05-09 DIAGNOSIS — R7989 Other specified abnormal findings of blood chemistry: Secondary | ICD-10-CM | POA: Diagnosis present

## 2016-05-09 DIAGNOSIS — F419 Anxiety disorder, unspecified: Secondary | ICD-10-CM | POA: Diagnosis present

## 2016-05-09 DIAGNOSIS — N179 Acute kidney failure, unspecified: Secondary | ICD-10-CM

## 2016-05-09 DIAGNOSIS — R197 Diarrhea, unspecified: Secondary | ICD-10-CM

## 2016-05-09 DIAGNOSIS — E861 Hypovolemia: Secondary | ICD-10-CM | POA: Diagnosis present

## 2016-05-09 DIAGNOSIS — Z886 Allergy status to analgesic agent status: Secondary | ICD-10-CM | POA: Diagnosis not present

## 2016-05-09 DIAGNOSIS — I9589 Other hypotension: Secondary | ICD-10-CM | POA: Diagnosis present

## 2016-05-09 DIAGNOSIS — A419 Sepsis, unspecified organism: Principal | ICD-10-CM

## 2016-05-09 DIAGNOSIS — R1031 Right lower quadrant pain: Secondary | ICD-10-CM | POA: Diagnosis present

## 2016-05-09 DIAGNOSIS — G43001 Migraine without aura, not intractable, with status migrainosus: Secondary | ICD-10-CM | POA: Diagnosis not present

## 2016-05-09 DIAGNOSIS — N170 Acute kidney failure with tubular necrosis: Secondary | ICD-10-CM | POA: Diagnosis present

## 2016-05-09 DIAGNOSIS — Z79899 Other long term (current) drug therapy: Secondary | ICD-10-CM | POA: Diagnosis not present

## 2016-05-09 DIAGNOSIS — K8020 Calculus of gallbladder without cholecystitis without obstruction: Secondary | ICD-10-CM | POA: Diagnosis present

## 2016-05-09 DIAGNOSIS — R112 Nausea with vomiting, unspecified: Secondary | ICD-10-CM

## 2016-05-09 DIAGNOSIS — E876 Hypokalemia: Secondary | ICD-10-CM | POA: Diagnosis present

## 2016-05-09 DIAGNOSIS — D751 Secondary polycythemia: Secondary | ICD-10-CM

## 2016-05-09 DIAGNOSIS — R109 Unspecified abdominal pain: Secondary | ICD-10-CM | POA: Insufficient documentation

## 2016-05-09 DIAGNOSIS — A09 Infectious gastroenteritis and colitis, unspecified: Secondary | ICD-10-CM | POA: Diagnosis present

## 2016-05-09 DIAGNOSIS — Z881 Allergy status to other antibiotic agents status: Secondary | ICD-10-CM | POA: Diagnosis not present

## 2016-05-09 DIAGNOSIS — E872 Acidosis: Secondary | ICD-10-CM | POA: Diagnosis present

## 2016-05-09 DIAGNOSIS — G43909 Migraine, unspecified, not intractable, without status migrainosus: Secondary | ICD-10-CM | POA: Diagnosis present

## 2016-05-09 LAB — LACTIC ACID, PLASMA
Lactic Acid, Venous: 1.6 mmol/L (ref 0.5–2.0)
Lactic Acid, Venous: 1.6 mmol/L (ref 0.5–2.0)

## 2016-05-09 LAB — GASTROINTESTINAL PANEL BY PCR, STOOL (REPLACES STOOL CULTURE)
Adenovirus F40/41: NOT DETECTED
Astrovirus: NOT DETECTED
Campylobacter species: NOT DETECTED
Cryptosporidium: NOT DETECTED
Cyclospora cayetanensis: NOT DETECTED
E. coli O157: NOT DETECTED
Entamoeba histolytica: NOT DETECTED
Enteroaggregative E coli (EAEC): NOT DETECTED
Enteropathogenic E coli (EPEC): NOT DETECTED
Enterotoxigenic E coli (ETEC): NOT DETECTED
Giardia lamblia: NOT DETECTED
Norovirus GI/GII: DETECTED — AB
Plesimonas shigelloides: NOT DETECTED
Rotavirus A: NOT DETECTED
Salmonella species: NOT DETECTED
Sapovirus (I, II, IV, and V): NOT DETECTED
Shiga like toxin producing E coli (STEC): NOT DETECTED
Shigella/Enteroinvasive E coli (EIEC): NOT DETECTED
Vibrio cholerae: NOT DETECTED
Vibrio species: NOT DETECTED
Yersinia enterocolitica: NOT DETECTED

## 2016-05-09 LAB — PROTIME-INR
INR: 1.07 (ref 0.00–1.49)
Prothrombin Time: 14.1 seconds (ref 11.6–15.2)

## 2016-05-09 LAB — BASIC METABOLIC PANEL
Anion gap: 8 (ref 5–15)
BUN: 20 mg/dL (ref 6–20)
CO2: 23 mmol/L (ref 22–32)
Calcium: 8.4 mg/dL — ABNORMAL LOW (ref 8.9–10.3)
Chloride: 111 mmol/L (ref 101–111)
Creatinine, Ser: 0.79 mg/dL (ref 0.44–1.00)
GFR calc Af Amer: >60 mL/min (ref >60)
GFR calc non Af Amer: >60 mL/min (ref >60)
Glucose, Bld: 96 mg/dL (ref 65–99)
Potassium: 3.1 mmol/L — ABNORMAL LOW (ref 3.5–5.1)
Sodium: 142 mmol/L (ref 135–145)

## 2016-05-09 LAB — URINALYSIS, ROUTINE W REFLEX MICROSCOPIC
Bilirubin Urine: NEGATIVE
Glucose, UA: NEGATIVE mg/dL
Ketones, ur: NEGATIVE mg/dL
Leukocytes, UA: NEGATIVE
Nitrite: NEGATIVE
Protein, ur: NEGATIVE mg/dL
Specific Gravity, Urine: 1.036 — ABNORMAL HIGH (ref 1.005–1.030)
pH: 5 (ref 5.0–8.0)

## 2016-05-09 LAB — CBC WITH DIFFERENTIAL/PLATELET
Basophils Absolute: 0 10*3/uL (ref 0.0–0.1)
Basophils Relative: 0 %
Eosinophils Absolute: 0 10*3/uL (ref 0.0–0.7)
Eosinophils Relative: 0 %
HCT: 40 % (ref 36.0–46.0)
Hemoglobin: 13.6 g/dL (ref 12.0–15.0)
Lymphocytes Relative: 7 %
Lymphs Abs: 0.6 10*3/uL — ABNORMAL LOW (ref 0.7–4.0)
MCH: 31.5 pg (ref 26.0–34.0)
MCHC: 34 g/dL (ref 30.0–36.0)
MCV: 92.6 fL (ref 78.0–100.0)
Monocytes Absolute: 0.4 10*3/uL (ref 0.1–1.0)
Monocytes Relative: 5 %
Neutro Abs: 7.7 10*3/uL (ref 1.7–7.7)
Neutrophils Relative %: 88 %
Platelets: 200 10*3/uL (ref 150–400)
RBC: 4.32 MIL/uL (ref 3.87–5.11)
RDW: 12.8 % (ref 11.5–15.5)
WBC: 8.7 10*3/uL (ref 4.0–10.5)

## 2016-05-09 LAB — GLUCOSE, CAPILLARY
Glucose-Capillary: 94 mg/dL (ref 65–99)
Glucose-Capillary: 100 mg/dL — ABNORMAL HIGH (ref 65–99)

## 2016-05-09 LAB — URINE MICROSCOPIC-ADD ON
Bacteria, UA: NONE SEEN
Squamous Epithelial / LPF: NONE SEEN
WBC, UA: NONE SEEN WBC/hpf (ref 0–5)

## 2016-05-09 LAB — PROCALCITONIN: Procalcitonin: 1.21 ng/mL

## 2016-05-09 LAB — APTT: aPTT: 38 seconds — ABNORMAL HIGH (ref 24–37)

## 2016-05-09 MED ORDER — POTASSIUM CHLORIDE CRYS ER 20 MEQ PO TBCR
60.0000 meq | EXTENDED_RELEASE_TABLET | Freq: Once | ORAL | Status: AC
  Administered 2016-05-09: 60 meq via ORAL
  Filled 2016-05-09: qty 3

## 2016-05-09 MED ORDER — SODIUM CHLORIDE 0.9 % IV BOLUS (SEPSIS)
1000.0000 mL | Freq: Once | INTRAVENOUS | Status: AC
  Administered 2016-05-09: 1000 mL via INTRAVENOUS

## 2016-05-09 MED ORDER — POTASSIUM CHLORIDE IN NACL 20-0.9 MEQ/L-% IV SOLN
INTRAVENOUS | Status: DC
  Administered 2016-05-09: 125 mL via INTRAVENOUS
  Administered 2016-05-09: 100 mL via INTRAVENOUS
  Administered 2016-05-10: 05:00:00 via INTRAVENOUS
  Filled 2016-05-09 (×4): qty 1000

## 2016-05-09 MED ORDER — POTASSIUM CHLORIDE IN NACL 20-0.9 MEQ/L-% IV SOLN: INTRAVENOUS | Status: DC

## 2016-05-09 MED ORDER — HYDROCORTISONE 2.5 % RE CREA
TOPICAL_CREAM | Freq: Three times a day (TID) | RECTAL | Status: DC
  Filled 2016-05-09: qty 28.35

## 2016-05-09 NOTE — Progress Notes (Signed)
Patient c/o abdominal cramping and diarrhea, which is reason for admission. Pt asked if she would be able to take imodium. Education provided regarding current treatment plan and effect imodium would have. Pt made aware results of GI panel have not been confirmed and therefore would not be able to give imodium at this time. Pt feels that current antibiotic course may have attributed to symptom diarrhea and refused medication(zosyn) to be administered. Pt made aware that she has already received a dose, she is not agreeable to current poc. Pt request to await GI panel result before continuing Zosyn. Will update provider.

## 2016-05-09 NOTE — Care Management Obs Status (Signed)
MEDICARE OBSERVATION STATUS NOTIFICATION   Patient Details  Name: Jamie [name] MRN: [id number] Date of Birth: 04-05-1956   Medicare Observation Status Notification Given:  Yes Pt in Isolation, form explained and copy given to pt.     Geni Bers, RN 05/09/2016, 4:27 PM

## 2016-05-09 NOTE — Progress Notes (Signed)
Patient BP this morning is 80/44, 80/50 if manually checked.  N.P informed and ordered NS 1L bolus. Will continue to monitor.

## 2016-05-09 NOTE — Progress Notes (Signed)
PROGRESS NOTE  Jamie [name]  PQZ:[id number] DOB: May 19, 1956 DOA: 05/08/2016 PCP: Pamelia Hoit, MD Outpatient Specialists:  Subjective: Complaining about nausea and now started to have the diarrhea again. GI panel pending  Brief Narrative:  60 year old female with history of arthritis and chronic GI issues, came in with nausea, vomiting and watery diarrhea.  Assessment & Plan:   Principal Problem:   Sepsis, unspecified organism (HCC) Active Problems:   Nausea vomiting and diarrhea   Migraine   AKI (acute kidney injury) (HCC)   Relative polycythemia   Anxiety   Sepsis (HCC)   Abdominal pain   Nausea, vomiting, & diarrhea, suspected infectious  - Etiology remains uncertain despite evaluation with CT and [place]  - Fever and elevated procalcitonin are suggestive of infectious process  - Empiric treatment with Zosyn initiated in ED, will continue for now given elevated procalcitonin - Blood cultures incubating  - Check GI pathogen panel  - Infection-control with enteric precautions  - Replacing fluid and electrolyte losses  - Symptom-management with Reglan, Zofran, Compazine prn   Kidney disease of uncertain chronicity  - SCr is 1.28 on admission, BUN 25, no prior for comparison  - Prerenal azotemia or ATN certainly possible given hypotension prior to IVF  - This is resolved overnight after IV fluid hydration, continue IV fluid.  Hypotension  - BP 55/35 in urgent care just prior to arrival  - Secondary to hypovolemia in setting of vomiting and diarrhea; infection contributing  - Aggressively hydrated with IV fluids.  Anxiety  - Stable  - Continue home-dose Xanax prn   Migraine - Developed one in ED; reports sxs as typical of her migraine in quality and severity - No focal neurologic deficit on exam  - Treat with home-dose Fioricet  Polycythemia - Hgb 15.4 on admission, likely d/t hemoconcentration  - Anticipate normalization with fluid  resuscitation   Hypokalemia - Replaced with oral and parenteral supplement, check BMP in a.m.   DVT prophylaxis: None Code Status: Full Code Family Communication: Plan discussed with the patient Disposition Plan: Observation Diet: Diet regular Room service appropriate?: Yes; Fluid consistency:: Thin  Consultants:   None  Procedures:   None  Antimicrobials:   None   Objective: Filed Vitals:   05/09/16 0531 05/09/16 0532 05/09/16 0700 05/09/16 0841  BP: 80/43 80/50 87/65  96/58  Pulse: 50     Temp: 98.1 F (36.7 C)     TempSrc: Oral     Resp: 16     Height:      Weight: 63.957 kg (141 lb)     SpO2: 95%       Intake/Output Summary (Last 24 hours) at 05/09/16 1127 Last data filed at 05/09/16 1019  Gross per 24 hour  Intake 698.33 ml  Output      1 ml  Net 697.33 ml   Filed Weights   05/08/16 2137 05/09/16 0012 05/09/16 0531  Weight: 61.236 kg (135 lb) 63.957 kg (141 lb) 63.957 kg (141 lb)    Examination: General exam: Appears calm and comfortable  Respiratory system: Clear to auscultation. Respiratory effort normal. Cardiovascular system: S1 & S2 heard, RRR. No JVD, murmurs, rubs, gallops or clicks. No pedal edema. Gastrointestinal system: Abdomen is nondistended, soft and nontender. No organomegaly or masses felt. Normal bowel sounds heard. Central nervous system: Alert and oriented. No focal neurological deficits. Extremities: Symmetric 5 x 5 power. Skin: No rashes, lesions or ulcers Psychiatry: Judgement and insight appear normal. Mood & affect appropriate.   Data  Reviewed: I have personally reviewed following labs and imaging studies  CBC:  Recent Labs Lab 05/08/16 1743 05/09/16 0203  WBC 10.0 8.7  NEUTROABS  --  7.7  HGB 15.4* 13.6  HCT 45.3 40.0  MCV 93.8 92.6  PLT 231 200   Basic Metabolic Panel:  Recent Labs Lab 05/08/16 1743 05/09/16 0203  NA 141 142  K 3.5 3.1*  CL 108 111  CO2 22 23  GLUCOSE 107* 96  BUN 25* 20  CREATININE  1.28* 0.79  CALCIUM 8.8* 8.4*   GFR: Estimated Creatinine Clearance: 63.8 mL/min (by C-G formula based on Cr of 0.79). Liver Function Tests:  Recent Labs Lab 05/08/16 1743  AST 33  ALT 28  ALKPHOS 69  BILITOT 1.1  PROT 7.2  ALBUMIN 4.1    Recent Labs Lab 05/08/16 1743  LIPASE 15   No results for input(s): AMMONIA in the last 168 hours. Coagulation Profile:  Recent Labs Lab 05/08/16 2353  INR 1.07   Cardiac Enzymes: No results for input(s): CKTOTAL, CKMB, CKMBINDEX, TROPONINI in the last 168 hours. BNP (last 3 results) No results for input(s): PROBNP in the last 8760 hours. HbA1C: No results for input(s): HGBA1C in the last 72 hours. CBG:  Recent Labs Lab 05/09/16 0818  GLUCAP 94   Lipid Profile: No results for input(s): CHOL, HDL, LDLCALC, TRIG, CHOLHDL, LDLDIRECT in the last 72 hours. Thyroid Function Tests: No results for input(s): TSH, T4TOTAL, FREET4, T3FREE, THYROIDAB in the last 72 hours. Anemia Panel: No results for input(s): VITAMINB12, FOLATE, FERRITIN, TIBC, IRON, RETICCTPCT in the last 72 hours. Urine analysis:    Component Value Date/Time   COLORURINE YELLOW 05/09/2016 0047   APPEARANCEUR CLEAR 05/09/2016 0047   LABSPEC 1.036* 05/09/2016 0047   PHURINE 5.0 05/09/2016 0047   GLUCOSEU NEGATIVE 05/09/2016 0047   HGBUR TRACE* 05/09/2016 0047   BILIRUBINUR NEGATIVE 05/09/2016 0047   KETONESUR NEGATIVE 05/09/2016 0047   PROTEINUR NEGATIVE 05/09/2016 0047   NITRITE NEGATIVE 05/09/2016 0047   LEUKOCYTESUR NEGATIVE 05/09/2016 0047   Sepsis Labs: @LABRCNTIP (procalcitonin:4,lacticidven:4)  )No results found for this or any previous visit (from the past 240 hour(s)).   Invalid input(s): PROCALCITONIN, LACTICACIDVEN   Radiology Studies: Ct Abdomen Pelvis W Contrast  05/08/2016  CLINICAL DATA:  61 year old female with right lower quadrant abdominal pain EXAM: CT ABDOMEN AND PELVIS WITH CONTRAST TECHNIQUE: Multidetector CT imaging of the abdomen  and pelvis was performed using the standard protocol following bolus administration of intravenous contrast. CONTRAST:  59mL ISOVUE-300 IOPAMIDOL (ISOVUE-300) INJECTION 61% COMPARISON:  Abdominal radiograph dated 07/30/2006 FINDINGS: There bibasilar linear atelectasis/scarring. No intra-abdominal free air or free fluid. The hepatomegaly measuring 18 cm in midclavicular length. Mild dilatation of the intrahepatic biliary tree. Small amount of layering stone noted within the gallbladder. The gallbladder is mildly distended. No pericholecystic fluid. Ultrasound may provide better evaluation of the gallbladder if clinically indicated. The pancreas, spleen, adrenal glands, kidneys, visualized ureters, and urinary bladder appear unremarkable. The uterus is grossly unremarkable. Linear metallic density in the right hemipelvis lateral to the uterus may be related to prior tubal occlusive device or bowel resection. Surgical metallic density noted in the left hemipelvis. There is no evidence of bowel obstruction or active inflammation. The appendix is not visualized with certainty. No inflammatory changes identified in the right lower quadrant. The abdominal aorta and IVC appear unremarkable. No portal venous gas identified. There is no adenopathy. There is mild diffuse subcutaneous soft tissue stranding. There is a small fat containing umbilical  hernia. There is osteopenia with degenerative changes of the spine. L5-S1 posterior fusion hardware and laminectomy changes. No acute fracture. IMPRESSION: No evidence of bowel obstruction or active inflammation. No CT evidence to suggest acute appendicitis. Hepatomegaly. Cholelithiasis. Ultrasound may provide better evaluation of the gallbladder. Electronically Signed   By: Elgie Collard M.D.   On: 05/08/2016 19:59   Dg Chest Port 1 View  05/08/2016  CLINICAL DATA:  Fever, nausea, vomiting, and diarrhea starting last night. Dizziness, palpitations, cramping, abdominal  discomfort, and headache. Nonsmoker. EXAM: PORTABLE CHEST 1 VIEW COMPARISON:  01/11/2009 FINDINGS: Shallow inspiration with elevation the left hemidiaphragm. Linear atelectasis in the lung bases. No focal airspace disease or consolidation in the lungs. No blunting of costophrenic angles. No pneumothorax. Normal heart size and pulmonary vascularity. Tortuous aorta. IMPRESSION: Shallow inspiration with linear atelectasis in the lung bases. Electronically Signed   By: Burman Nieves M.D.   On: 05/08/2016 23:05   US Abdomen Limited Ruq  05/08/2016  CLINICAL DATA:  60 year old female with right upper quadrant abdominal pain EXAM: US ABDOMEN LIMITED - RIGHT UPPER QUADRANT COMPARISON:  CT dated 05/08/2016 FINDINGS: Gallbladder: Multiple small stones noted within the gallbladder. There is no gallbladder wall thickening or pericholecystic fluid. Common bile duct: Diameter: 5 mm proximally up to 9 mm in the midportion of the CBD. Liver: No focal lesion identified. Within normal limits in parenchymal echogenicity. Mild intrahepatic biliary ductal dilatation. IMPRESSION: Cholelithiasis without sonographic evidence of acute cholecystitis. Electronically Signed   By: Elgie Collard M.D.   On: 05/08/2016 23:13        Scheduled Meds: . enoxaparin (LOVENOX) injection  40 mg Subcutaneous QHS  . multivitamin with minerals  1 tablet Oral Daily  . piperacillin-tazobactam (ZOSYN)  IV  3.375 g Intravenous Q8H  . saccharomyces boulardii  250 mg Oral BID  . sodium chloride flush  3 mL Intravenous Q12H   Continuous Infusions: . 0.9 % NaCl with KCl 20 mEq / L 125 mL (05/09/16 0835)        Time spent: 35 minutes    Jamie [name] A, MD [facility] Pager [phone]  If 7PM-7AM, please contact night-coverage [email or web address] Password [facility] 05/09/2016, 11:27 AM

## 2016-05-09 NOTE — Progress Notes (Signed)
Pharmacy Antibiotic Note  Jamie [name] is a 59 y.o. female admitted on 05/08/2016 with NVD and possible intra-abdominal infection.  Pharmacy has been consulted for zosyn dosing.  Plan: Zosyn 3.375g IV q8h (4 hour infusion).  Height: 5\' 4"  (162.6 cm) Weight: 141 lb (63.957 kg) IBW/kg (Calculated) : 54.7  Temp (24hrs), Avg:99.4 F (37.4 C), Min:98.3 F (36.8 C), Max:101.4 F (38.6 C)   Recent Labs Lab 05/08/16 1743 05/08/16 1753 05/08/16 2124 05/08/16 2352 05/09/16 0203  WBC 10.0  --   --   --  8.7  CREATININE 1.28*  --   --   --  0.79  LATICACIDVEN  --  3.23* 1.37 1.6 1.6    Estimated Creatinine Clearance: 63.8 mL/min (by C-G formula based on Cr of 0.79).    Allergies  Allergen Reactions  . Azithromycin Nausea And Vomiting  . Nsaids Other (See Comments)    Stomach problems    Antimicrobials this admission: Zosyn 5/14 >>    Dose adjustments this admission: -  Microbiology results: Pending  Thank you for allowing pharmacy to be a part of this patient's care.  6/14 [place] 05/09/2016 4:38 AM

## 2016-05-09 NOTE — Plan of Care (Signed)
Problem: Education: Goal: Knowledge of [facility] Education information/materials will improve Outcome: Not Progressing Patient was provided education on suspected diagnosis and plan of care. Patient is not agreeable to plan at this time. Provider made aware

## 2016-05-10 LAB — GLUCOSE, CAPILLARY: Glucose-Capillary: 110 mg/dL — ABNORMAL HIGH (ref 65–99)

## 2016-05-10 LAB — BASIC METABOLIC PANEL
Anion gap: 3 — ABNORMAL LOW (ref 5–15)
BUN: 10 mg/dL (ref 6–20)
CO2: 21 mmol/L — ABNORMAL LOW (ref 22–32)
Calcium: 8.8 mg/dL — ABNORMAL LOW (ref 8.9–10.3)
Chloride: 115 mmol/L — ABNORMAL HIGH (ref 101–111)
Creatinine, Ser: 0.42 mg/dL — ABNORMAL LOW (ref 0.44–1.00)
GFR calc Af Amer: >60 mL/min (ref >60)
GFR calc non Af Amer: >60 mL/min (ref >60)
Glucose, Bld: 127 mg/dL — ABNORMAL HIGH (ref 65–99)
Potassium: 4.1 mmol/L (ref 3.5–5.1)
Sodium: 139 mmol/L (ref 135–145)

## 2016-05-10 LAB — CBC
HCT: 33.9 % — ABNORMAL LOW (ref 36.0–46.0)
Hemoglobin: 11.9 g/dL — ABNORMAL LOW (ref 12.0–15.0)
MCH: 31.9 pg (ref 26.0–34.0)
MCHC: 35.1 g/dL (ref 30.0–36.0)
MCV: 90.9 fL (ref 78.0–100.0)
Platelets: 160 10*3/uL (ref 150–400)
RBC: 3.73 MIL/uL — ABNORMAL LOW (ref 3.87–5.11)
RDW: 12.8 % (ref 11.5–15.5)
WBC: 5.6 10*3/uL (ref 4.0–10.5)

## 2016-05-10 LAB — URINE CULTURE: Culture: NO GROWTH

## 2016-05-10 MED ORDER — IBUPROFEN 200 MG PO TABS: 600.0000 mg | ORAL_TABLET | Freq: Once | ORAL | Status: DC

## 2016-05-10 NOTE — Discharge Summary (Signed)
Physician Discharge Summary  Jamie [name] UKG:[id number] DOB: Nov 21, 1956 DOA: 05/08/2016  PCP: Pamelia Hoit, MD  Admit date: 05/08/2016 Discharge date: 05/10/2016  Time spent: 40 minutes  Recommendations for Outpatient Follow-up:  1. Follow up with PCP in 1 week   Discharge Diagnoses:  Principal Problem:   Sepsis, unspecified organism [facility]) Active Problems:   Nausea vomiting and diarrhea   Migraine   AKI (acute kidney injury) (HCC)   Relative polycythemia   Anxiety   Sepsis (HCC)   Abdominal pain   Discharge Condition: Stable  Diet recommendation: Heart healthy  Filed Weights   05/09/16 0012 05/09/16 0531 05/10/16 0520  Weight: 63.957 kg (141 lb) 63.957 kg (141 lb) 64.275 kg (141 lb 11.2 oz)    History of present illness:  Jamie [name] is a 60 y.o. female with medical history significant for arthritis, migraines, and anxiety who presents to the ED with nausea, vomiting, and diarrhea of one-day duration. Patient reports chronic GI upset for which she takes Reglan at home, but was in her usual state of fairly good health until last night when she developed crampy central abdominal pain with nausea, nonbloody vomiting, and watery diarrhea. Patient denies any recent long distance travel or sick contacts. She denied any fevers, chills, dysuria, or flank pain. She reports lightheadedness and palpitations associated with this illness. She took Imodium yesterday with some improvement in symptoms. There is been no recent change in her medications or diet. She went to an urgent care this evening for evaluation and was found to have blood pressure 55/35. She was bolused with 1 L of normal saline and SBP rose to 70. She was transported [facility] for further evaluation.  ED Course: Upon arrival to the ED, patient is found to be afebrile initially, later spiking a temperature to 38.6 C. Blood pressure was in the 90/60 range on arrival with remaining vitals stable. Lactic  acid was 3.23 initially, improving to 1.37 after a 30 cc/kg normal saline bolus. CMP features a serum creatinine 1.28 with no prior for comparison. CBC is notable for a hemoglobin of 15.4. Chest x-ray demonstrates atelectasis in the bases with low inspiratory volume. CT of the abdomen was obtained and negative for SBO, inflammatory process, or acute appendicitis. Cholelithiasis was noted on the CT and further evaluated with abdominal ultrasound which does not feature any evidence of acute cholecystitis. Blood cultures were obtained and empiric treatment was started with Zosyn. Patient developed a migraine headache while in the ED, relating that the symptoms are consistent with her chronic migraines. Symptomatic management was provided with fentanyl, Reglan, Benadryl, and Fioricet. Blood pressure improved with the fluid bolus and the patient remained hemodynamically stable in the emergency department. She'll be admitted for ongoing evaluation and management of acute infectious nausea, vomiting, and diarrhea.  Hospital Course:    Nausea, vomiting, & diarrhea, secondary to no oral virus gastroenteritis  - Etiology remains uncertain despite evaluation with CT and [place]  - Fever and elevated procalcitonin are suggestive of infectious process  - Initially started on Zosyn, GI panel came back positive for virus. - On discharge recommended OTC probiotic, nausea and vomiting resolved.  Acute kidney injury - SCr is 1.28 on admission, BUN 25, no prior for comparison  - Prerenal azotemia or ATN certainly possible given hypotension prior to IVF  - This is resolved overnight after IV fluid hydration, continue IV fluid.  Hypotension  - BP 55/35 in urgent care just prior to arrival  -  Secondary to hypovolemia in setting of vomiting and diarrhea; infection contributing  - This is resolved with aggressive hydration with IV fluids..  Anxiety  - Stable  - Continue home-dose Xanax prn   Migraine -  Developed one in ED; reports sxs as typical of her migraine in quality and severity - No focal neurologic deficit on exam  - Treat with home-dose Fioricet  Polycythemia - Hgb 15.4 on admission, likely d/t hemoconcentration  -Normalized with IV fluids resuscitation.  Hypokalemia - Replaced with oral and parenteral supplement, check BMP in a.m.  Procedures:  None  Consultations:  None  Discharge Exam: Filed Vitals:   05/09/16 2058 05/10/16 0520  BP: 164/78 125/73  Pulse: 51 38  Temp: 99.3 F (37.4 C) 97.6 F (36.4 C)  Resp: 12 16  General: Alert and awake, oriented x3, not in any acute distress. HEENT: anicteric sclera, pupils reactive to light and accommodation, EOMI CVS: S1-S2 clear, no murmur rubs or gallops Chest: clear to auscultation bilaterally, no wheezing, rales or rhonchi Abdomen: soft nontender, nondistended, normal bowel sounds, no organomegaly Extremities: no cyanosis, clubbing or edema noted bilaterally Neuro: Cranial nerves II-XII intact, no focal neurological deficits  Discharge Instructions   Discharge Instructions    Diet - low sodium heart healthy    Complete by:  As directed      Increase activity slowly    Complete by:  As directed           Current Discharge Medication List    CONTINUE these medications which have NOT CHANGED   Details  ALPRAZolam (XANAX) 1 MG tablet Take 1 mg by mouth 4 (four) times daily as needed for anxiety or sleep. An additional 2mg  at bedtime for sleep    Ascorbic Acid (VITAMIN C PO) Take 1 capsule by mouth daily.    butalbital-acetaminophen-caffeine (FIORICET, ESGIC) 50-325-40 MG tablet Take 2 tablets by mouth every 4 (four) hours as needed for migraine.     Calcium Carb-Cholecalciferol (CALCIUM + D3 PO) Take 1 capsule by mouth daily.    Cyanocobalamin (VITAMIN B 12 PO) Take 1 capsule by mouth daily.    metoCLOPramide (REGLAN) 10 MG tablet Take 10 mg by mouth every 6 (six) hours as needed for nausea or  vomiting.    Multiple Vitamin (MULTIVITAMIN) tablet Take 1 tablet by mouth daily.    saccharomyces boulardii (FLORASTOR) 250 MG capsule Take 250 mg by mouth 2 (two) times daily.       Allergies  Allergen Reactions  . Azithromycin Nausea And Vomiting  . Nsaids Other (See Comments)    Stomach problems   Follow-up Information    Follow up with , MD In 1 week.   Specialty:  Family Medicine   Contact information:   [address] [place] [phone]        The results of significant diagnostics from this hospitalization (including imaging, microbiology, ancillary and laboratory) are listed below for reference.    Significant Diagnostic Studies: Ct Abdomen Pelvis W Contrast  05/08/2016  CLINICAL DATA:  60 year old female with right lower quadrant abdominal pain EXAM: CT ABDOMEN AND PELVIS WITH CONTRAST TECHNIQUE: Multidetector CT imaging of the abdomen and pelvis was performed using the standard protocol following bolus administration of intravenous contrast. CONTRAST:  15mL ISOVUE-300 IOPAMIDOL (ISOVUE-300) INJECTION 61% COMPARISON:  Abdominal radiograph dated 07/30/2006 FINDINGS: There bibasilar linear atelectasis/scarring. No intra-abdominal free air or free fluid. The hepatomegaly measuring 18 cm in midclavicular length. Mild dilatation of the intrahepatic  biliary tree. Small amount of layering stone noted within the gallbladder. The gallbladder is mildly distended. No pericholecystic fluid. Ultrasound may provide better evaluation of the gallbladder if clinically indicated. The pancreas, spleen, adrenal glands, kidneys, visualized ureters, and urinary bladder appear unremarkable. The uterus is grossly unremarkable. Linear metallic density in the right hemipelvis lateral to the uterus may be related to prior tubal occlusive device or bowel resection. Surgical metallic density noted in the left hemipelvis. There is no evidence of bowel obstruction or  active inflammation. The appendix is not visualized with certainty. No inflammatory changes identified in the right lower quadrant. The abdominal aorta and IVC appear unremarkable. No portal venous gas identified. There is no adenopathy. There is mild diffuse subcutaneous soft tissue stranding. There is a small fat containing umbilical hernia. There is osteopenia with degenerative changes of the spine. L5-S1 posterior fusion hardware and laminectomy changes. No acute fracture. IMPRESSION: No evidence of bowel obstruction or active inflammation. No CT evidence to suggest acute appendicitis. Hepatomegaly. Cholelithiasis. Ultrasound may provide better evaluation of the gallbladder. Electronically Signed   By: Elgie Collard M.D.   On: 05/08/2016 19:59   Dg Chest Port 1 View  05/08/2016  CLINICAL DATA:  Fever, nausea, vomiting, and diarrhea starting last night. Dizziness, palpitations, cramping, abdominal discomfort, and headache. Nonsmoker. EXAM: PORTABLE CHEST 1 VIEW COMPARISON:  01/11/2009 FINDINGS: Shallow inspiration with elevation the left hemidiaphragm. Linear atelectasis in the lung bases. No focal airspace disease or consolidation in the lungs. No blunting of costophrenic angles. No pneumothorax. Normal heart size and pulmonary vascularity. Tortuous aorta. IMPRESSION: Shallow inspiration with linear atelectasis in the lung bases. Electronically Signed   By: Burman Nieves M.D.   On: 05/08/2016 23:05   US Abdomen Limited Ruq  05/08/2016  CLINICAL DATA:  60 year old female with right upper quadrant abdominal pain EXAM: US ABDOMEN LIMITED - RIGHT UPPER QUADRANT COMPARISON:  CT dated 05/08/2016 FINDINGS: Gallbladder: Multiple small stones noted within the gallbladder. There is no gallbladder wall thickening or pericholecystic fluid. Common bile duct: Diameter: 5 mm proximally up to 9 mm in the midportion of the CBD. Liver: No focal lesion identified. Within normal limits in parenchymal echogenicity. Mild  intrahepatic biliary ductal dilatation. IMPRESSION: Cholelithiasis without sonographic evidence of acute cholecystitis. Electronically Signed   By: Elgie Collard M.D.   On: 05/08/2016 23:13    Microbiology: Recent Results (from the past 240 hour(s))  Urine culture     Status: None   Collection Time: 05/09/16 12:47 AM  Result Value Ref Range Status   Specimen Description URINE, CLEAN CATCH  Final   Special Requests NONE  Final   Culture NO GROWTH Performed at [facility]   Final   Report Status 05/10/2016 FINAL  Final  Gastrointestinal Panel by PCR , Stool     Status: Abnormal   Collection Time: 05/09/16 10:19 AM  Result Value Ref Range Status   Campylobacter species NOT DETECTED NOT DETECTED Final   Plesimonas shigelloides NOT DETECTED NOT DETECTED Final   Salmonella species NOT DETECTED NOT DETECTED Final   Yersinia enterocolitica NOT DETECTED NOT DETECTED Final   Vibrio species NOT DETECTED NOT DETECTED Final   Vibrio cholerae NOT DETECTED NOT DETECTED Final   Enteroaggregative E coli (EAEC) NOT DETECTED NOT DETECTED Final   Enteropathogenic E coli (EPEC) NOT DETECTED NOT DETECTED Final   Enterotoxigenic E coli (ETEC) NOT DETECTED NOT DETECTED Final   Shiga like toxin producing E coli (STEC) NOT DETECTED NOT DETECTED Final  E. coli O157 NOT DETECTED NOT DETECTED Final   Shigella/Enteroinvasive E coli (EIEC) NOT DETECTED NOT DETECTED Final   Cryptosporidium NOT DETECTED NOT DETECTED Final   Cyclospora cayetanensis NOT DETECTED NOT DETECTED Final   Entamoeba histolytica NOT DETECTED NOT DETECTED Final   Giardia lamblia NOT DETECTED NOT DETECTED Final   Adenovirus F40/41 NOT DETECTED NOT DETECTED Final   Astrovirus NOT DETECTED NOT DETECTED Final   Norovirus GI/GII DETECTED (A) NOT DETECTED Final    Comment: CRITICAL RESULT CALLED TO, READ BACK BY AND VERIFIED WITH: NATASHA BROOKS ([facility]) AT 1718 05/09/16 KL[facility]. CRITICAL RESULT CALLED TO, READ BACK BY AND VERIFIED  WITH: A MELTON AT 1725 ON 05.15.2017 BY NBROOKS    Rotavirus A NOT DETECTED NOT DETECTED Final   Sapovirus (I, II, IV, and V) NOT DETECTED NOT DETECTED Final     Labs: Basic Metabolic Panel:  Recent Labs Lab 05/08/16 1743 05/09/16 0203 05/10/16 0438  NA 141 142 139  K 3.5 3.1* 4.1  CL 108 111 115*  CO2 22 23 21*  GLUCOSE 107* 96 127*  BUN 25* 20 10  CREATININE 1.28* 0.79 0.42*  CALCIUM 8.8* 8.4* 8.8*   Liver Function Tests:  Recent Labs Lab 05/08/16 1743  AST 33  ALT 28  ALKPHOS 69  BILITOT 1.1  PROT 7.2  ALBUMIN 4.1    Recent Labs Lab 05/08/16 1743  LIPASE 15   No results for input(s): AMMONIA in the last 168 hours. CBC:  Recent Labs Lab 05/08/16 1743 05/09/16 0203 05/10/16 0438  WBC 10.0 8.7 5.6  NEUTROABS  --  7.7  --   HGB 15.4* 13.6 11.9*  HCT 45.3 40.0 33.9*  MCV 93.8 92.6 90.9  PLT 231 200 160   Cardiac Enzymes: No results for input(s): CKTOTAL, CKMB, CKMBINDEX, TROPONINI in the last 168 hours. BNP: BNP (last 3 results) No results for input(s): BNP in the last 8760 hours.  ProBNP (last 3 results) No results for input(s): PROBNP in the last 8760 hours.  CBG:  Recent Labs Lab 05/09/16 0818 05/09/16 2053 05/10/16 0724  GLUCAP 94 100* 110*       Signed:  Mazelle Huebert A MD.  [facility] 05/10/2016, 10:36 AM

## 2016-05-14 LAB — CULTURE, BLOOD (ROUTINE X 2)
Culture: NO GROWTH
Culture: NO GROWTH

## 2016-08-03 ENCOUNTER — Encounter: Payer: Self-pay | Admitting: Internal Medicine

## 2016-09-20 ENCOUNTER — Emergency Department (HOSPITAL_COMMUNITY)
Admission: EM | Admit: 2016-09-20 | Discharge: 2016-09-21 | Disposition: A | Discharge status: Other Acute Inpatient Hospital | Payer: No Typology Code available for payment source | Attending: Emergency Medicine | Admitting: Emergency Medicine

## 2016-09-20 ENCOUNTER — Encounter (HOSPITAL_COMMUNITY): Payer: Self-pay

## 2016-09-20 DIAGNOSIS — Z79891 Long term (current) use of opiate analgesic: Secondary | ICD-10-CM | POA: Diagnosis not present

## 2016-09-20 DIAGNOSIS — G4700 Insomnia, unspecified: Secondary | ICD-10-CM | POA: Insufficient documentation

## 2016-09-20 DIAGNOSIS — F411 Generalized anxiety disorder: Secondary | ICD-10-CM | POA: Insufficient documentation

## 2016-09-20 DIAGNOSIS — F4323 Adjustment disorder with mixed anxiety and depressed mood: Secondary | ICD-10-CM | POA: Diagnosis not present

## 2016-09-20 DIAGNOSIS — Z046 Encounter for general psychiatric examination, requested by authority: Secondary | ICD-10-CM | POA: Diagnosis present

## 2016-09-20 DIAGNOSIS — Z79899 Other long term (current) drug therapy: Secondary | ICD-10-CM | POA: Insufficient documentation

## 2016-09-20 DIAGNOSIS — R45851 Suicidal ideations: Secondary | ICD-10-CM

## 2016-09-20 NOTE — ED Notes (Signed)
Patient requesting to use telephone to call daughter. [employer] states that patient and daughter became verbally aggressive on the phone earlier. At this time, I do not feel it is appropriate for patient to contact daughter. Explained this to patient and patient states understanding.
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
## 2016-09-20 NOTE — ED Triage Notes (Signed)
Patient via [facility] with IVC papers taken out by patients daughter. Daughter feels patient is a harm to herself. Patient states "she accidentally wrote a letter and sent it to daughter"  Per [facility] letter appeared to be a farewell letter. Patient states she has a lot of external stressors and appears anxious at this time.

## 2016-09-20 NOTE — ED Notes (Signed)
Pt's daughter is requesting MD to contact her regarding additional information about pt Jamie [name] [phone]

## 2016-09-20 NOTE — ED Notes (Signed)
Patient provided a copy of our [facility] Guidelines outlining our phone call policy, and visitor guideline

## 2016-09-21 LAB — CBC WITH DIFFERENTIAL/PLATELET
Basophils Absolute: 0.1 10*3/uL (ref 0.0–0.1)
Basophils Relative: 1 %
Eosinophils Absolute: 0.2 10*3/uL (ref 0.0–0.7)
Eosinophils Relative: 2 %
HCT: 42.5 % (ref 36.0–46.0)
Hemoglobin: 14.7 g/dL (ref 12.0–15.0)
Lymphocytes Relative: 29 %
Lymphs Abs: 3.2 10*3/uL (ref 0.7–4.0)
MCH: 32.5 pg (ref 26.0–34.0)
MCHC: 34.6 g/dL (ref 30.0–36.0)
MCV: 93.8 fL (ref 78.0–100.0)
Monocytes Absolute: 1.1 10*3/uL — ABNORMAL HIGH (ref 0.1–1.0)
Monocytes Relative: 9 %
Neutro Abs: 6.5 10*3/uL (ref 1.7–7.7)
Neutrophils Relative %: 59 %
Platelets: 402 10*3/uL — ABNORMAL HIGH (ref 150–400)
RBC: 4.53 MIL/uL (ref 3.87–5.11)
RDW: 12.9 % (ref 11.5–15.5)
WBC: 11.1 10*3/uL — ABNORMAL HIGH (ref 4.0–10.5)

## 2016-09-21 LAB — COMPREHENSIVE METABOLIC PANEL
ALT: 20 U/L (ref 14–54)
AST: 22 U/L (ref 15–41)
Albumin: 4.9 g/dL (ref 3.5–5.0)
Alkaline Phosphatase: 90 U/L (ref 38–126)
Anion gap: 11 (ref 5–15)
BUN: 5 mg/dL — ABNORMAL LOW (ref 6–20)
CO2: 23 mmol/L (ref 22–32)
Calcium: 10.3 mg/dL (ref 8.9–10.3)
Chloride: 101 mmol/L (ref 101–111)
Creatinine, Ser: 0.58 mg/dL (ref 0.44–1.00)
GFR calc Af Amer: >60 mL/min (ref >60)
GFR calc non Af Amer: >60 mL/min (ref >60)
Glucose, Bld: 118 mg/dL — ABNORMAL HIGH (ref 65–99)
Potassium: 3.4 mmol/L — ABNORMAL LOW (ref 3.5–5.1)
Sodium: 135 mmol/L (ref 135–145)
Total Bilirubin: 0.6 mg/dL (ref 0.3–1.2)
Total Protein: 7.8 g/dL (ref 6.5–8.1)

## 2016-09-21 LAB — URINALYSIS, ROUTINE W REFLEX MICROSCOPIC
Bilirubin Urine: NEGATIVE
Glucose, UA: NEGATIVE mg/dL
Hgb urine dipstick: NEGATIVE
Ketones, ur: 15 mg/dL — AB
Leukocytes, UA: NEGATIVE
Nitrite: NEGATIVE
Protein, ur: NEGATIVE mg/dL
Specific Gravity, Urine: 1.01 (ref 1.005–1.030)
pH: 6 (ref 5.0–8.0)

## 2016-09-21 LAB — RAPID URINE DRUG SCREEN, HOSP PERFORMED
Amphetamines: NOT DETECTED
Barbiturates: POSITIVE — AB
Benzodiazepines: POSITIVE — AB
Cocaine: NOT DETECTED
Opiates: POSITIVE — AB
Tetrahydrocannabinol: POSITIVE — AB

## 2016-09-21 LAB — SALICYLATE LEVEL: Salicylate Lvl: <4 mg/dL (ref 2.8–30.0)

## 2016-09-21 LAB — ACETAMINOPHEN LEVEL: Acetaminophen (Tylenol), Serum: <10 ug/mL — ABNORMAL LOW (ref 10–30)

## 2016-09-21 LAB — ETHANOL: Alcohol, Ethyl (B): <5 mg/dL (ref <5)

## 2016-09-21 MED ORDER — ALPRAZOLAM 0.5 MG PO TABS: 1.0000 mg | ORAL_TABLET | Freq: Three times a day (TID) | ORAL | Status: DC | PRN

## 2016-09-21 MED ORDER — LORAZEPAM 1 MG PO TABS: 1.0000 mg | ORAL_TABLET | Freq: Three times a day (TID) | ORAL | Status: DC | PRN

## 2016-09-21 MED ORDER — ATENOLOL 25 MG PO TABS
50.0000 mg | ORAL_TABLET | Freq: Every day | ORAL | Status: DC
  Filled 2016-09-21: qty 2

## 2016-09-21 MED ORDER — ALPRAZOLAM 0.5 MG PO TABS: 2.0000 mg | ORAL_TABLET | Freq: Every evening | ORAL | Status: DC | PRN

## 2016-09-21 MED ORDER — ALUM & MAG HYDROXIDE-SIMETH 200-200-20 MG/5ML PO SUSP: 30.0000 mL | ORAL | Status: DC | PRN

## 2016-09-21 MED ORDER — ZOLPIDEM TARTRATE 5 MG PO TABS: 10.0000 mg | ORAL_TABLET | Freq: Every evening | ORAL | Status: DC | PRN

## 2016-09-21 MED ORDER — ACETAMINOPHEN 325 MG PO TABS: 650.0000 mg | ORAL_TABLET | ORAL | Status: DC | PRN

## 2016-09-21 MED ORDER — NICOTINE 21 MG/24HR TD PT24
21.0000 mg | MEDICATED_PATCH | Freq: Every day | TRANSDERMAL | Status: DC
  Filled 2016-09-21: qty 1

## 2016-09-21 MED ORDER — ONDANSETRON HCL 4 MG PO TABS: 4.0000 mg | ORAL_TABLET | Freq: Three times a day (TID) | ORAL | Status: DC | PRN

## 2016-09-21 NOTE — ED Notes (Signed)
Patient ambulatory to restroom with cane to collect urine sample.

## 2016-09-21 NOTE — ED Notes (Signed)
Sealed valuables bag placed into pt belonging bag and given to transporting officer.

## 2016-09-21 NOTE — ED Notes (Signed)
TTS at this time. 

## 2016-09-21 NOTE — ED Notes (Signed)
Patient sitting up on side of bed at this time. Offered to reposition patient to a more comfortable position. Patient declined, warm blanket given and socks given. Patient declined to use them [employer] at bedside and sitter at bedside

## 2016-09-21 NOTE — ED Notes (Signed)
Pt cooperative at this time. °

## 2016-09-21 NOTE — Progress Notes (Signed)
Spoke with Misty Stanley at [facility]- pt accepted for admission by Dr. Shawnie Dapper Report #: [phone]. Can arrive anytime per [facility].  Ilean Skill, MSW, LCSW Clinical Social Work, Disposition  09/21/2016 [phone]

## 2016-09-21 NOTE — ED Notes (Signed)
[employer] examiner arrived to transport pt and determined it would need to be county.  They notified appropriate agency.  Awaiting new transport.

## 2016-09-21 NOTE — ED Notes (Signed)
Report given to [facility] at [facility].

## 2016-09-21 NOTE — ED Notes (Signed)
Patient continues to refuse to lay back in bed in a position of comfort. Patient sitting on side of bed

## 2016-09-21 NOTE — ED Notes (Signed)
Notified by Chelsea Aus RN at [facility] that pt has been accepted.  [facility] notified.

## 2016-09-21 NOTE — ED Notes (Signed)
Pt's valuables obtained from security and left in sealed envelope.
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
## 2016-09-21 NOTE — ED Notes (Addendum)
error 

## 2016-09-21 NOTE — ED Notes (Signed)
Patient states she gets her medications filled at [facility]. Patient unable to provide proper verification of medication. Contacted [facility] to verify medication list:  Paroxetine 20mg  po daily Letrozol 2.5mg  po daily Resedronate Sodium 35mg  1poq7days  EDP notified of patients medications

## 2016-10-18 ENCOUNTER — Ambulatory Visit: Payer: Medicare Other | Admitting: Internal Medicine

## 2017-01-03 ENCOUNTER — Ambulatory Visit: Payer: Medicare Other | Admitting: Internal Medicine

## 2019-12-13 ENCOUNTER — Encounter: Payer: Self-pay | Admitting: Family Medicine

## 2020-07-04 ENCOUNTER — Other Ambulatory Visit: Payer: Self-pay

## 2020-07-04 ENCOUNTER — Emergency Department (HOSPITAL_COMMUNITY): Payer: Medicare HMO

## 2020-07-04 ENCOUNTER — Emergency Department (HOSPITAL_COMMUNITY)
Admission: EM | Admit: 2020-07-04 | Discharge: 2020-07-04 | Disposition: A | Discharge status: Home / Self Care | Payer: Medicare HMO | Attending: Emergency Medicine | Admitting: Emergency Medicine

## 2020-07-04 DIAGNOSIS — Y999 Unspecified external cause status: Secondary | ICD-10-CM | POA: Insufficient documentation

## 2020-07-04 DIAGNOSIS — S5012XA Contusion of left forearm, initial encounter: Secondary | ICD-10-CM | POA: Diagnosis not present

## 2020-07-04 DIAGNOSIS — T40694A Poisoning by other narcotics, undetermined, initial encounter: Secondary | ICD-10-CM | POA: Diagnosis not present

## 2020-07-04 DIAGNOSIS — Z79899 Other long term (current) drug therapy: Secondary | ICD-10-CM | POA: Insufficient documentation

## 2020-07-04 DIAGNOSIS — Y939 Activity, unspecified: Secondary | ICD-10-CM | POA: Diagnosis not present

## 2020-07-04 DIAGNOSIS — G478 Other sleep disorders: Secondary | ICD-10-CM | POA: Diagnosis not present

## 2020-07-04 DIAGNOSIS — W1839XA Other fall on same level, initial encounter: Secondary | ICD-10-CM | POA: Diagnosis not present

## 2020-07-04 DIAGNOSIS — R110 Nausea: Secondary | ICD-10-CM | POA: Insufficient documentation

## 2020-07-04 DIAGNOSIS — R238 Other skin changes: Secondary | ICD-10-CM | POA: Insufficient documentation

## 2020-07-04 DIAGNOSIS — S62102A Fracture of unspecified carpal bone, left wrist, initial encounter for closed fracture: Secondary | ICD-10-CM

## 2020-07-04 DIAGNOSIS — S59912A Unspecified injury of left forearm, initial encounter: Secondary | ICD-10-CM | POA: Diagnosis present

## 2020-07-04 DIAGNOSIS — T50904A Poisoning by unspecified drugs, medicaments and biological substances, undetermined, initial encounter: Secondary | ICD-10-CM

## 2020-07-04 DIAGNOSIS — S52612A Displaced fracture of left ulna styloid process, initial encounter for closed fracture: Secondary | ICD-10-CM | POA: Insufficient documentation

## 2020-07-04 DIAGNOSIS — Y929 Unspecified place or not applicable: Secondary | ICD-10-CM | POA: Insufficient documentation

## 2020-07-04 LAB — RAPID URINE DRUG SCREEN, HOSP PERFORMED
Amphetamines: NOT DETECTED
Barbiturates: POSITIVE — AB
Benzodiazepines: POSITIVE — AB
Cocaine: NOT DETECTED
Opiates: NOT DETECTED
Tetrahydrocannabinol: NOT DETECTED

## 2020-07-04 LAB — CBC WITH DIFFERENTIAL/PLATELET
Abs Immature Granulocytes: 0.04 10*3/uL (ref 0.00–0.07)
Basophils Absolute: 0 10*3/uL (ref 0.0–0.1)
Basophils Relative: 1 %
Eosinophils Absolute: 0.3 10*3/uL (ref 0.0–0.5)
Eosinophils Relative: 5 %
HCT: 31 % — ABNORMAL LOW (ref 36.0–46.0)
Hemoglobin: 10.3 g/dL — ABNORMAL LOW (ref 12.0–15.0)
Immature Granulocytes: 1 %
Lymphocytes Relative: 23 %
Lymphs Abs: 1.5 10*3/uL (ref 0.7–4.0)
MCH: 32.5 pg (ref 26.0–34.0)
MCHC: 33.2 g/dL (ref 30.0–36.0)
MCV: 97.8 fL (ref 80.0–100.0)
Monocytes Absolute: 0.5 10*3/uL (ref 0.1–1.0)
Monocytes Relative: 7 %
Neutro Abs: 4.2 10*3/uL (ref 1.7–7.7)
Neutrophils Relative %: 63 %
Platelets: 318 10*3/uL (ref 150–400)
RBC: 3.17 MIL/uL — ABNORMAL LOW (ref 3.87–5.11)
RDW: 12.9 % (ref 11.5–15.5)
WBC: 6.6 10*3/uL (ref 4.0–10.5)
nRBC: 0 % (ref 0.0–0.2)

## 2020-07-04 LAB — COMPREHENSIVE METABOLIC PANEL
ALT: 11 U/L (ref 0–44)
AST: 18 U/L (ref 15–41)
Albumin: 3 g/dL — ABNORMAL LOW (ref 3.5–5.0)
Alkaline Phosphatase: 62 U/L (ref 38–126)
Anion gap: 9 (ref 5–15)
BUN: 11 mg/dL (ref 8–23)
CO2: 26 mmol/L (ref 22–32)
Calcium: 8.9 mg/dL (ref 8.9–10.3)
Chloride: 97 mmol/L — ABNORMAL LOW (ref 98–111)
Creatinine, Ser: 0.61 mg/dL (ref 0.44–1.00)
GFR calc Af Amer: >60 mL/min (ref >60)
GFR calc non Af Amer: >60 mL/min (ref >60)
Glucose, Bld: 94 mg/dL (ref 70–99)
Potassium: 3.3 mmol/L — ABNORMAL LOW (ref 3.5–5.1)
Sodium: 132 mmol/L — ABNORMAL LOW (ref 135–145)
Total Bilirubin: 0.4 mg/dL (ref 0.3–1.2)
Total Protein: 5.6 g/dL — ABNORMAL LOW (ref 6.5–8.1)

## 2020-07-04 LAB — URINALYSIS, ROUTINE W REFLEX MICROSCOPIC
Bilirubin Urine: NEGATIVE
Glucose, UA: NEGATIVE mg/dL
Hgb urine dipstick: NEGATIVE
Ketones, ur: NEGATIVE mg/dL
Leukocytes,Ua: NEGATIVE
Nitrite: NEGATIVE
Protein, ur: NEGATIVE mg/dL
Specific Gravity, Urine: 1.011 (ref 1.005–1.030)
pH: 5 (ref 5.0–8.0)

## 2020-07-04 LAB — ETHANOL: Alcohol, Ethyl (B): <10 mg/dL (ref <10)

## 2020-07-04 MED ORDER — HYDROCODONE-ACETAMINOPHEN 10-325 MG PO TABS: 1.0000 | ORAL_TABLET | ORAL | 0 refills | Status: DC | PRN

## 2020-07-04 MED ORDER — IOHEXOL 9 MG/ML PO SOLN
ORAL | Status: AC
  Filled 2020-07-04: qty 1000

## 2020-07-04 MED ORDER — HYDROCODONE-ACETAMINOPHEN 10-325 MG PO TABS: 1.0000 | ORAL_TABLET | ORAL | 0 refills | Status: AC | PRN

## 2020-07-04 MED ORDER — FENTANYL CITRATE (PF) 100 MCG/2ML IJ SOLN
12.5000 ug | Freq: Once | INTRAMUSCULAR | Status: AC
  Administered 2020-07-04: 12.5 ug via INTRAVENOUS
  Filled 2020-07-04: qty 2

## 2020-07-04 NOTE — Progress Notes (Signed)
Orthopedic Tech Progress Note Patient Details:  Jamie [name] 1956/02/14 [id number]  Ortho Devices Type of Ortho Device: Velcro wrist forearm splint Ortho Device/Splint Location: left Ortho Device/Splint Interventions: Application   Post Interventions Patient Tolerated: Well Instructions Provided: Care of device   Saul Fordyce 07/04/2020, 6:30 PM

## 2020-07-04 NOTE — Discharge Instructions (Addendum)
As discussed, it is very portly follow-up with your physician within the coming days to discuss your medication regimen.  Specifically discuss your sedatives, pain medication and sleep aids.  In addition, please follow-up with your orthopedic physician for ongoing management of your ulnar styloid or wrist fracture.  Please stay well-hydrated, monitor your condition carefully, and do not hesitate to return here if you develop new, or concerning changes in your condition.
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
## 2020-07-04 NOTE — ED Notes (Signed)
Labeled urine specimen and culture sent to lab. ENMiles 

## 2020-07-04 NOTE — ED Notes (Signed)
EDP Lockwood at bedside 

## 2020-07-04 NOTE — ED Notes (Signed)
Pt visualized walking in hallway with staggered gait, walking towards ambulance bay doors.  Pt instructed to return to room.  Pt began demanding that she leave.  Pt convinced to return to room so that she can be evaluated.  Pt continued to insist that she wants to leave, she only "took the sleep aids because I havent slept in a week."  EDP Jeraldine Loots made aware.

## 2020-07-05 ENCOUNTER — Encounter (HOSPITAL_COMMUNITY): Payer: Self-pay | Admitting: Emergency Medicine

## 2020-07-16 ENCOUNTER — Ambulatory Visit: Payer: Medicare HMO | Admitting: Family Medicine

## 2020-07-30 ENCOUNTER — Ambulatory Visit: Payer: Medicare HMO | Admitting: Family Medicine

## 2020-08-06 ENCOUNTER — Ambulatory Visit: Payer: Medicare HMO | Admitting: Family Medicine

## 2020-10-02 ENCOUNTER — Encounter (HOSPITAL_BASED_OUTPATIENT_CLINIC_OR_DEPARTMENT_OTHER): Payer: Medicare HMO | Attending: Internal Medicine | Admitting: Internal Medicine

## 2020-10-02 DIAGNOSIS — W19XXXD Unspecified fall, subsequent encounter: Secondary | ICD-10-CM | POA: Insufficient documentation

## 2020-10-02 DIAGNOSIS — L97211 Non-pressure chronic ulcer of right calf limited to breakdown of skin: Secondary | ICD-10-CM | POA: Diagnosis not present

## 2020-10-02 DIAGNOSIS — S80811D Abrasion, right lower leg, subsequent encounter: Secondary | ICD-10-CM | POA: Insufficient documentation

## 2020-10-06 NOTE — Progress Notes (Signed)
KERINGTON, HILDEBRANT ([id number]) Visit Report for 10/02/2020 Allergy List Details Patient Name: Date of Service: Jamie [name], Jamie [name] 10/02/2020 1:15 PM Medical Record Number: [id number] Patient Account Number: [id number] Date of Birth/Sex: Treating RN: Dec 15, 1956 (64 y.o. Female) Yevonne Pax Primary Care Larrie Fraizer: Barbie Banner Other Clinician: Referring Rosalita Carey: Treating Alisha Bacus/Extender: Worthy Rancher Weeks in Treatment: 0 Allergies Active Allergies latex Allergy Notes Electronic Signature(s) Signed: 10/06/2020 5:49:00 PM By: Yevonne Pax RN Entered By: Yevonne Pax on 10/02/2020 13:52:22 -------------------------------------------------------------------------------- Arrival Information Details Patient Name: Date of Service: Jamie [name] 10/02/2020 1:15 PM Medical Record Number: [id number] Patient Account Number: [id number] Date of Birth/Sex: Treating RN: 1956-08-04 (64 y.o. Female) Yevonne Pax Primary Care Rakwon Letourneau: Barbie Banner Other Clinician: Referring Bodi Palmeri: Treating Dajanique Robley/Extender: Sharlene Dory in Treatment: 0 Visit Information Patient Arrived: Danella Maiers Time: 13:41 Accompanied By: self Transfer Assistance: None Patient Identification Verified: Yes Secondary Verification Process Completed: Yes Patient Requires Transmission-Based Precautions: No Patient Has Alerts: No Electronic Signature(s) Signed: 10/06/2020 5:49:00 PM By: Yevonne Pax RN Entered By: Yevonne Pax on 10/02/2020 13:47:47 -------------------------------------------------------------------------------- Clinic Level of Care Assessment Details Patient Name: Date of Service: Jamie [name], Jamie [name] 10/02/2020 1:15 PM Medical Record Number: [id number] Patient Account Number: [id number] Date of Birth/Sex: Treating RN: 1956/08/20 (64 y.o. Female) Cherylin Mylar Primary Care Amaury Kuzel: Barbie Banner Other Clinician: Referring Napolean Sia: Treating  Von Inscoe/Extender: Worthy Rancher Weeks in Treatment: 0 Clinic Level of Care Assessment Items TOOL 2 Quantity Score X- 1 0 Use when only an EandM is performed on the INITIAL visit ASSESSMENTS - Nursing Assessment / Reassessment X- 1 20 General Physical Exam (combine w/ comprehensive assessment (listed just below) when performed on new pt. evals) X- 1 25 Comprehensive Assessment (HX, ROS, Risk Assessments, Wounds Hx, etc.) ASSESSMENTS - Wound and Skin A ssessment / Reassessment X - Simple Wound Assessment / Reassessment - one wound 1 5  - 0 Complex Wound Assessment / Reassessment - multiple wounds  - 0 Dermatologic / Skin Assessment (not related to wound area) ASSESSMENTS - Ostomy and/or Continence Assessment and Care  - 0 Incontinence Assessment and Management  - 0 Ostomy Care Assessment and Management (repouching, etc.) PROCESS - Coordination of Care X - Simple Patient / Family Education for ongoing care 1 15  - 0 Complex (extensive) Patient / Family Education for ongoing care X- 1 10 Staff obtains Chiropractor, Records, T Results / Process Orders est  - 0 Staff telephones HHA, Nursing Homes / Clarify orders / etc  - 0 Routine Transfer to another Facility (non-emergent condition)  - 0 Routine Hospital Admission (non-emergent condition)  - 0 New Admissions / Manufacturing engineer / Ordering NPWT Apligraf, etc. ,  - 0 Emergency Hospital Admission (emergent condition) X- 1 10 Simple Discharge Coordination  - 0 Complex (extensive) Discharge Coordination PROCESS - Special Needs  - 0 Pediatric / Minor Patient Management  - 0 Isolation Patient Management  - 0 Hearing / Language / Visual special needs  - 0 Assessment of Community assistance (transportation, D/C planning, etc.)  - 0 Additional assistance / Altered mentation  - 0 Support Surface(s) Assessment (bed, cushion, seat, etc.) INTERVENTIONS - Wound Cleansing /  Measurement X- 1 5 Wound Imaging (photographs - any number of wounds)  - 0 Wound Tracing (instead of photographs) X- 1 5 Simple Wound Measurement - one wound  - 0 Complex Wound Measurement - multiple wounds X- 1 5 Simple Wound Cleansing -  one wound []  - 0 Complex Wound Cleansing - multiple wounds INTERVENTIONS - Wound Dressings X - Small Wound Dressing one or multiple wounds 1 10 []  - 0 Medium Wound Dressing one or multiple wounds []  - 0 Large Wound Dressing one or multiple wounds []  - 0 Application of Medications - injection INTERVENTIONS - Miscellaneous []  - 0 External ear exam []  - 0 Specimen Collection (cultures, biopsies, blood, body fluids, etc.) []  - 0 Specimen(s) / Culture(s) sent or taken to Lab for analysis []  - 0 Patient Transfer (multiple staff / Lift / Similar devices) []  - 0 Simple Staple / Suture removal (25 or less) []  - 0 Complex Staple / Suture removal (26 or more) []  - 0 Hypo / Hyperglycemic Management (close monitor of Blood Glucose) X- 1 15 Ankle / Brachial Index (ABI) - do not check if billed separately Has the patient been seen at the hospital within the last three years: Yes Total Score: 125 Level Of Care: New/Established - Level 4 Electronic Signature(s) Signed: 10/02/2020 5:35:06 PM By: Entered By: on 10/02/2020 15:01:36 -------------------------------------------------------------------------------- Encounter Discharge Information Details Patient Name: Date of Service: . 10/02/2020 1:15 PM Medical Record Number: Patient Account Number: Michiel Sites Date of Birth/Sex: Treating RN: 11/23/1956 (64 y.o. Female) Primary Care Jeniyah Menor: 12/02/2020 Other Clinician: Referring Marinda Tyer: Treating Shammond Arave/Extender: Cherylin Mylar in Treatment: 0 Encounter Discharge Information Items Discharge Condition: Stable Ambulatory Status:  Cane Discharge Destination: Home Transportation: Private Auto Accompanied By: self Schedule Follow-up Appointment: Yes Clinical Summary of Care: Electronic Signature(s) Signed: 10/02/2020 5:50:34 PM By: 12/02/2020 Entered By: Jamie [name] on 10/02/2020 15:41:54 -------------------------------------------------------------------------------- Lower Extremity Assessment Details Patient Name: Date of Service: Jamie [name], Jamie [name] 10/02/2020 1:15 PM Medical Record Number: 09/11/1956 Patient Account Number: [id number] Date of Birth/Sex: Treating RN: 07/10/1956 (64 y.o. Female) Sharlene Dory Primary Care Dayshon Roback: 12/02/2020 Other Clinician: Referring Braeden Dolinski: Treating Laqueta Bonaventura/Extender: Shawn Stall Weeks in Treatment: 0 Edema Assessment Assessed: [Left: No] [Right: No] E[Left: dema] [Right: :] Calf Left: Right: Point of Measurement: 38 cm From Medial Instep 32 cm Ankle Left: Right: Point of Measurement: 11 cm From Medial Instep 22 cm Vascular Assessment Blood Pressure: Brachial: [Right:112] Ankle: [Right:Dorsalis Pedis: 118 1.05] Electronic Signature(s) Signed: 10/06/2020 5:49:00 PM By: 12/02/2020 RN Entered By: Jamie [name] on 10/02/2020 14:07:06 -------------------------------------------------------------------------------- Multi Wound Chart Details Patient Name: Date of Service: [id number]. 10/02/2020 1:15 PM Medical Record Number: 09/11/1956 Patient Account Number: [id number] Date of Birth/Sex: Treating RN: [birth year] (64 y.o. Female) Worthy Rancher Primary Care Peter Daquila: 12/06/2020 Other Clinician: Referring Shali Vesey: Treating Doyel Mulkern/Extender: Yevonne Pax Weeks in Treatment: 0 Vital Signs Height(in): 63 Pulse(bpm): 78 Weight(lbs): 115 Blood Pressure(mmHg): 112/80 Body Mass Index(BMI): 20 Temperature(F): 97.5 Respiratory Rate(breaths/min): 16 Photos: [1:No Photos Right Lower Leg] [N/A:N/A N/A] Wound  Location: [1:Trauma] [N/A:N/A] Wounding Event: [1:Trauma, Other] [N/A:N/A] Primary Etiology: [1:Raynauds, Rheumatoid Arthritis,] [N/A:N/A] Comorbid History: [1:Osteoarthritis 08/25/2020] [N/A:N/A] Date Acquired: [1:0] [N/A:N/A] Weeks of Treatment: [1:Open] [N/A:N/A] Wound Status: [1:3.5x1x0.1] [N/A:N/A] Measurements L x W x D (cm) [1:2.749] [N/A:N/A] A (cm) : rea [1:0.275] [N/A:N/A] Volume (cm) : [1:Full Thickness Without Exposed] [N/A:N/A] Classification: [1:Support Structures Medium] [N/A:N/A] Exudate Amount: [1:Serosanguineous] [N/A:N/A] Exudate Type: [1:red, brown] [N/A:N/A] Exudate Color: [1:Medium (34-66%)] [N/A:N/A] Granulation Amount: [1:Pink, Pale] [N/A:N/A] Granulation Quality: [1:Medium (34-66%)] [N/A:N/A] Necrotic Amount: [1:Fat Layer (Subcutaneous Tissue): Yes N/A] Exposed Structures: [1:Fascia: No Tendon: No  Muscle: No Joint: No Bone: No None] [N/A:N/A] Epithelialization: Treatment Notes Electronic Signature(s) Signed: 10/02/2020 5:35:06 PM By: Cherylin Mylar Signed: 10/05/2020 5:03:49 PM By: Baltazar Najjar MD Entered By: Baltazar Najjar on 10/02/2020 15:14:54 -------------------------------------------------------------------------------- Multi-Disciplinary Care Plan Details Patient Name: Date of Service: Jamie [name] 10/02/2020 1:15 PM Medical Record Number: [id number] Patient Account Number: [id number] Date of Birth/Sex: Treating RN: Nov 17, 1956 (64 y.o. Female) Cherylin Mylar Primary Care Jasline Buskirk: Barbie Banner Other Clinician: Referring Yasmina Chico: Treating Ezella Kell/Extender: Worthy Rancher Weeks in Treatment: 0 Active Inactive Orientation to the Wound Care Program Nursing Diagnoses: Knowledge deficit related to the wound healing center program Goals: Patient/caregiver will verbalize understanding of the Wound Healing Center Program Date Initiated: 10/02/2020 Target Resolution Date: 10/30/2020 Goal Status:  Active Interventions: Provide education on orientation to the wound center Notes: Wound/Skin Impairment Nursing Diagnoses: Impaired tissue integrity Goals: Ulcer/skin breakdown will have a volume reduction of 30% by week 4 Date Initiated: 10/02/2020 Target Resolution Date: 10/30/2020 Goal Status: Active Interventions: Provide education on ulcer and skin care Notes: Electronic Signature(s) Signed: 10/02/2020 5:35:06 PM By: Cherylin Mylar Entered By: Cherylin Mylar on 10/02/2020 14:54:01 -------------------------------------------------------------------------------- Pain Assessment Details Patient Name: Date of Service: Jamie [name], Jamie [name] 10/02/2020 1:15 PM Medical Record Number: [id number] Patient Account Number: [id number] Date of Birth/Sex: Treating RN: 01-Sep-1956 (64 y.o. Female) Yevonne Pax Primary Care Lindey Renzulli: Barbie Banner Other Clinician: Referring Jaquavion Mccannon: Treating Mykiah Schmuck/Extender: Worthy Rancher Weeks in Treatment: 0 Active Problems Location of Pain Severity and Description of Pain Patient Has Paino No Site Locations Pain Management and Medication Current Pain Management: Electronic Signature(s) Signed: 10/06/2020 5:49:00 PM By: Yevonne Pax RN Entered By: Yevonne Pax on 10/02/2020 14:21:50 -------------------------------------------------------------------------------- Patient/Caregiver Education Details Patient Name: Date of Service: Jamie [name] 10/8/2021andnbsp1:15 PM Medical Record Number: [id number] Patient Account Number: [id number] Date of Birth/Gender: Treating RN: Jul 21, 1956 (64 y.o. Female) Cherylin Mylar Primary Care Physician: Barbie Banner Other Clinician: Referring Physician: Treating Physician/Extender: Sharlene Dory in Treatment: 0 Education Assessment Education Provided To: Patient Education Topics Provided Welcome [facility]: o Handouts: Welcome T [facility] o Methods: Explain/Verbal Responses: State content correctly Wound/Skin Impairment: Handouts: Caring for Your Ulcer Methods: Explain/Verbal Responses: State content correctly Electronic Signature(s) Signed: 10/02/2020 5:35:06 PM By: Cherylin Mylar Entered By: Cherylin Mylar on 10/02/2020 14:55:06 -------------------------------------------------------------------------------- Wound Assessment Details Patient Name: Date of Service: Jamie [name] 10/02/2020 1:15 PM Medical Record Number: [id number] Patient Account Number: [id number] Date of Birth/Sex: Treating RN: 01/31/1956 (64 y.o. Female) Epps, Carrie Primary Care Kaytelyn Glore: Barbie Banner Other Clinician: Referring Birdie Fetty: Treating Lane Eland/Extender: Worthy Rancher Weeks in Treatment: 0 Wound Status Wound Number: 1 Primary Etiology: Trauma, Other Wound Location: Right Lower Leg Wound Status: Open Wounding Event: Trauma Comorbid History: Raynauds, Rheumatoid Arthritis, Osteoarthritis Date Acquired: 08/25/2020 Weeks Of Treatment: 0 Clustered Wound: No Photos Photo Uploaded By: Benjaman Kindler on 10/05/2020 11:25:10 Wound Measurements Length: (cm) 3.5 Width: (cm) 1 Depth: (cm) 0.1 Area: (cm) 2.749 Volume: (cm) 0.275 % Reduction in Area: % Reduction in Volume: Epithelialization: None Tunneling: No Undermining: No Wound Description Classification: Full Thickness Without Exposed Support Structu Exudate Amount: Medium Exudate Type: Serosanguineous Exudate Color: red, brown res Foul Odor After Cleansing: No Slough/Fibrino Yes Wound Bed Granulation Amount: Medium (34-66%) Exposed Structure Granulation Quality: Pink, Pale Fascia Exposed: No Necrotic Amount: Medium (34-66%) Fat Layer (Subcutaneous Tissue) Exposed: Yes Necrotic Quality: Adherent  Slough Tendon Exposed: No Muscle Exposed: No Joint Exposed: No Bone Exposed: No Treatment Notes Wound #1 (Right Lower Leg) 1. Cleanse  With Wound Cleanser 2. Periwound Care Skin Prep 3. Primary Dressing Applied Collegen AG Hydrogel or K-Y Jelly 4. Secondary Dressing Foam Border Dressing 5. Secured With Self Adhesive Bandage Notes explained the dressings, frequency of change, and when to return to wound center. patient in agreement. Electronic Signature(s) Signed: 10/06/2020 5:49:00 PM By: Yevonne Pax RN Entered By: Yevonne Pax on 10/02/2020 14:21:36 -------------------------------------------------------------------------------- Vitals Details Patient Name: Date of Service: Jamie [name]. 10/02/2020 1:15 PM Medical Record Number: [id number] Patient Account Number: [id number] Date of Birth/Sex: Treating RN: September 16, 1956 (64 y.o. Female) Epps, Carrie Primary Care Lillyrose Reitan: Barbie Banner Other Clinician: Referring Robertt Buda: Treating Orlandis Sanden/Extender: Worthy Rancher Weeks in Treatment: 0 Vital Signs Time Taken: 13:47 Temperature (F): 97.5 Height (in): 63 Pulse (bpm): 78 Source: Stated Respiratory Rate (breaths/min): 16 Weight (lbs): 115 Blood Pressure (mmHg): 112/80 Source: Stated Reference Range: 80 - 120 mg / dl Body Mass Index (BMI): 20.4 Electronic Signature(s) Signed: 10/06/2020 5:49:00 PM By: Yevonne Pax RN Entered By: Yevonne Pax on 10/02/2020 13:48:21

## 2020-10-06 NOTE — Progress Notes (Signed)
Jamie, [name] ([id number]) Visit Report for 10/02/2020 Chief Complaint Document Details Patient Name: Date of Service: Jamie, [name] 10/02/2020 1:15 PM Medical Record Number: [id number] Patient Account Number: [id number] Date of Birth/Sex: Treating RN: 22-Mar-1956 (64 y.o. Female) Cherylin Mylar Primary Care Provider: Barbie Banner Other Clinician: Referring Provider: Treating Provider/Extender: Worthy Rancher Weeks in Treatment: 0 Information Obtained from: Patient Chief Complaint 10/02/2020; patient comes in with an abrasion injury on the right lower lateral calf Electronic Signature(s) Signed: 10/05/2020 5:03:49 PM By: Baltazar Najjar MD Entered By: Baltazar Najjar on 10/02/2020 15:15:23 -------------------------------------------------------------------------------- HPI Details Patient Name: Date of Service: Jamie [name]. 10/02/2020 1:15 PM Medical Record Number: [id number] Patient Account Number: [id number] Date of Birth/Sex: Treating RN: 08/19/1956 (64 y.o. Female) Cherylin Mylar Primary Care Provider: Barbie Banner Other Clinician: Referring Provider: Treating Provider/Extender: Worthy Rancher Weeks in Treatment: 0 History of Present Illness HPI Description: ADMISSION 10/02/2020 This is a patient who suffered a fall about a month ago with a sizable what sounds like skin tear on the right lateral lower calf. Initially she was using Neosporin. She eventually saw her primary physician who gave her a course of Bactrim and she has been using Betadine on the wound. She actually has had a fair amount of good healing in the open area here looks as though it is gone down by perhaps 75%. Past medical history irritable bowel syndrome depression fibromyalgia arthritis with Raynaud's phenomenon. ABI in our clinic was 1.05 on the right Electronic Signature(s) Signed: 10/05/2020 5:03:49 PM By: Baltazar Najjar MD Entered By: Baltazar Najjar on  10/02/2020 15:18:28 -------------------------------------------------------------------------------- Physical Exam Details Patient Name: Date of Service: Jamie [name]. 10/02/2020 1:15 PM Medical Record Number: [id number] Patient Account Number: [id number] Date of Birth/Sex: Treating RN: [birth year] (64 y.o. Female) Cherylin Mylar Primary Care Provider: Barbie Banner Other Clinician: Referring Provider: Treating Provider/Extender: Worthy Rancher Weeks in Treatment: 0 Constitutional Sitting or standing Blood Pressure is within target range for patient.. Pulse regular and within target range for patient.Marland Kitchen Respirations regular, non-labored and within target range.. Temperature is normal and within the target range for the patient.Marland Kitchen Appears in no distress. Respiratory work of breathing is normal. Cardiovascular Pedal pulses are palpable. Psychiatric appears at normal baseline. Notes Wound exam; right lateral calf what is left of this is an oval-shaped wound with more length and width. Even under illumination the surface of it looks healthy no debridement is required. There is no surrounding cellulitis. She does have some edema in the lower leg but I do not think this was severe enough to require compression. Electronic Signature(s) Signed: 10/05/2020 5:03:49 PM By: Baltazar Najjar MD Entered By: Baltazar Najjar on 10/02/2020 15:19:40 -------------------------------------------------------------------------------- Physician Orders Details Patient Name: Date of Service: Jamie [name]. 10/02/2020 1:15 PM Medical Record Number: [id number] Patient Account Number: [id number] Date of Birth/Sex: Treating RN: November 15, 1956 (64 y.o. Female) Cherylin Mylar Primary Care Provider: Barbie Banner Other Clinician: Referring Provider: Treating Provider/Extender: Worthy Rancher Weeks in Treatment: 0 Verbal / Phone Orders: No Diagnosis Coding Follow-up  Appointments Return Appointment in 1 week. Dressing Change Frequency Wound #1 Right Lower Leg Change Dressing every other day. Wound Cleansing May shower and wash wound with soap and water. - when dressing is changed. DO NOT GET DRESSING WET Primary Wound Dressing Wound #1 Right Lower Leg Silver Collagen - moisten with hydrogel Secondary Dressing Wound #1 Right Lower  Leg Foam Border Electronic Signature(s) Signed: 10/02/2020 5:35:06 PM By: Cherylin Mylar Signed: 10/05/2020 5:03:49 PM By: Baltazar Najjar MD Entered By: Cherylin Mylar on 10/02/2020 14:57:58 -------------------------------------------------------------------------------- Problem List Details Patient Name: Date of Service: Jamie [name] 10/02/2020 1:15 PM Medical Record Number: [id number] Patient Account Number: [id number] Date of Birth/Sex: Treating RN: April 15, 1956 (64 y.o. Female) Cherylin Mylar Primary Care Provider: Barbie Banner Other Clinician: Referring Provider: Treating Provider/Extender: Worthy Rancher Weeks in Treatment: 0 Active Problems ICD-10 Encounter Code Description Active Date MDM Diagnosis S80.811D Abrasion, right lower leg, subsequent encounter 10/02/2020 No Yes L97.211 Non-pressure chronic ulcer of right calf limited to breakdown of skin 10/02/2020 No Yes Inactive Problems Resolved Problems Electronic Signature(s) Signed: 10/05/2020 5:03:49 PM By: Baltazar Najjar MD Entered By: Baltazar Najjar on 10/02/2020 15:14:46 -------------------------------------------------------------------------------- Progress Note Details Patient Name: Date of Service: Jamie [name]. 10/02/2020 1:15 PM Medical Record Number: [id number] Patient Account Number: [id number] Date of Birth/Sex: Treating RN: [birth year] (64 y.o. Female) Cherylin Mylar Primary Care Provider: Barbie Banner Other Clinician: Referring Provider: Treating Provider/Extender: Worthy Rancher Weeks in Treatment: 0 Subjective Chief Complaint Information obtained from Patient 10/02/2020; patient comes in with an abrasion injury on the right lower lateral calf History of Present Illness (HPI) ADMISSION 10/02/2020 This is a patient who suffered a fall about a month ago with a sizable what sounds like skin tear on the right lateral lower calf. Initially she was using Neosporin. She eventually saw her primary physician who gave her a course of Bactrim and she has been using Betadine on the wound. She actually has had a fair amount of good healing in the open area here looks as though it is gone down by perhaps 75%. Past medical history irritable bowel syndrome depression fibromyalgia arthritis with Raynaud's phenomenon. ABI in our clinic was 1.05 on the right Patient History Information obtained from Patient. Allergies latex Family History Diabetes - Maternal Grandparents, Heart Disease - Mother, Stroke - Maternal Grandparents, Thyroid Problems - Maternal Grandparents,Mother, No family history of Cancer, Hereditary Spherocytosis, Hypertension, Kidney Disease, Lung Disease, Seizures, Tuberculosis. Social History Former smoker, Marital Status - Divorced, Alcohol Use - Never, Drug Use - No History, Caffeine Use - Rarely. Medical History Eyes Denies history of Cataracts, Glaucoma, Optic Neuritis Ear/Nose/Mouth/Throat Denies history of Chronic sinus problems/congestion, Middle ear problems Hematologic/Lymphatic Denies history of Anemia, Hemophilia, Human Immunodeficiency Virus, Lymphedema, Sickle Cell Disease Respiratory Denies history of Aspiration, Asthma, Chronic Obstructive Pulmonary Disease (COPD), Pneumothorax, Sleep Apnea, Tuberculosis Cardiovascular Denies history of Angina, Arrhythmia, Congestive Heart Failure, Coronary Artery Disease, Deep Vein Thrombosis, Hypertension, Hypotension, Myocardial Infarction, Peripheral Arterial Disease, Peripheral Venous Disease, Phlebitis,  Vasculitis Gastrointestinal Denies history of Cirrhosis , Colitis, Crohnoos, Hepatitis A, Hepatitis B, Hepatitis C Endocrine Denies history of Type I Diabetes, Type II Diabetes Genitourinary Denies history of End Stage Renal Disease Immunological Patient has history of Raynaudoos Denies history of Lupus Erythematosus, Scleroderma Integumentary (Skin) Denies history of History of Burn Musculoskeletal Patient has history of Rheumatoid Arthritis, Osteoarthritis Denies history of Gout Neurologic Denies history of Dementia, Neuropathy, Quadriplegia, Paraplegia, Seizure Disorder Oncologic Denies history of Received Chemotherapy, Received Radiation Psychiatric Denies history of Anorexia/bulimia, Confinement Anxiety Review of Systems (ROS) Constitutional Symptoms (General Health) Denies complaints or symptoms of Fatigue, Fever, Chills, Marked Weight Change. Eyes Denies complaints or symptoms of Dry Eyes, Vision Changes, Glasses / Contacts. Ear/Nose/Mouth/Throat Denies complaints or symptoms of Chronic sinus problems or rhinitis. Respiratory Denies complaints or symptoms  of Chronic or frequent coughs, Shortness of Breath. Cardiovascular Denies complaints or symptoms of Chest pain. Gastrointestinal Denies complaints or symptoms of Frequent diarrhea, Nausea, Vomiting. Endocrine Denies complaints or symptoms of Heat/cold intolerance. Genitourinary Denies complaints or symptoms of Frequent urination. Integumentary (Skin) Complains or has symptoms of Wounds. Musculoskeletal Denies complaints or symptoms of Muscle Pain, Muscle Weakness. Neurologic Denies complaints or symptoms of Numbness/parasthesias. Psychiatric Complains or has symptoms of Claustrophobia. Denies complaints or symptoms of Suicidal. Objective Constitutional Sitting or standing Blood Pressure is within target range for patient.. Pulse regular and within target range for patient.Marland Kitchen Respirations regular, non-labored  and within target range.. Temperature is normal and within the target range for the patient.Marland Kitchen Appears in no distress. Vitals Time Taken: 1:47 PM, Height: 63 in, Source: Stated, Weight: 115 lbs, Source: Stated, BMI: 20.4, Temperature: 97.5 F, Pulse: 78 bpm, Respiratory Rate: 16 breaths/min, Blood Pressure: 112/80 mmHg. Respiratory work of breathing is normal. Cardiovascular Pedal pulses are palpable. Psychiatric appears at normal baseline. General Notes: Wound exam; right lateral calf what is left of this is an oval-shaped wound with more length and width. Even under illumination the surface of it looks healthy no debridement is required. There is no surrounding cellulitis. She does have some edema in the lower leg but I do not think this was severe enough to require compression. Integumentary (Hair, Skin) Wound #1 status is Open. Original cause of wound was Trauma. The wound is located on the Right Lower Leg. The wound measures 3.5cm length x 1cm width x 0.1cm depth; 2.749cm^2 area and 0.275cm^3 volume. There is Fat Layer (Subcutaneous Tissue) exposed. There is no tunneling or undermining noted. There is a medium amount of serosanguineous drainage noted. There is medium (34-66%) pink, pale granulation within the wound bed. There is a medium (34-66%) amount of necrotic tissue within the wound bed including Adherent Slough. Assessment Active Problems ICD-10 Abrasion, right lower leg, subsequent encounter Non-pressure chronic ulcer of right calf limited to breakdown of skin Plan Follow-up Appointments: Return Appointment in 1 week. Dressing Change Frequency: Wound #1 Right Lower Leg: Change Dressing every other day. Wound Cleansing: May shower and wash wound with soap and water. - when dressing is changed. DO NOT GET DRESSING WET Primary Wound Dressing: Wound #1 Right Lower Leg: Silver Collagen - moisten with hydrogel Secondary Dressing: Wound #1 Right Lower Leg: Foam Border 1. We  will use silver collagen moistened with saline or K-Y jelly. 2. Border foam dressing. 3. Assess for improvement next week may need to consider compression if this does not progress towards healing Electronic Signature(s) Signed: 10/05/2020 5:03:49 PM By: Baltazar Najjar MD Entered By: Baltazar Najjar on 10/02/2020 15:20:22 -------------------------------------------------------------------------------- HxROS Details Patient Name: Date of Service: Jamie [name]. 10/02/2020 1:15 PM Medical Record Number: [id number] Patient Account Number: [id number] Date of Birth/Sex: Treating RN: [birth year] (64 y.o. Female) Yevonne Pax Primary Care Provider: Barbie Banner Other Clinician: Referring Provider: Treating Provider/Extender: Worthy Rancher Weeks in Treatment: 0 Information Obtained From Patient Constitutional Symptoms (General Health) Complaints and Symptoms: Negative for: Fatigue; Fever; Chills; Marked Weight Change Eyes Complaints and Symptoms: Negative for: Dry Eyes; Vision Changes; Glasses / Contacts Medical History: Negative for: Cataracts; Glaucoma; Optic Neuritis Ear/Nose/Mouth/Throat Complaints and Symptoms: Negative for: Chronic sinus problems or rhinitis Medical History: Negative for: Chronic sinus problems/congestion; Middle ear problems Respiratory Complaints and Symptoms: Negative for: Chronic or frequent coughs; Shortness of Breath Medical History: Negative for: Aspiration; Asthma; Chronic Obstructive Pulmonary Disease (COPD); Pneumothorax;  Sleep Apnea; Tuberculosis Cardiovascular Complaints and Symptoms: Negative for: Chest pain Medical History: Negative for: Angina; Arrhythmia; Congestive Heart Failure; Coronary Artery Disease; Deep Vein Thrombosis; Hypertension; Hypotension; Myocardial Infarction; Peripheral Arterial Disease; Peripheral Venous Disease; Phlebitis; Vasculitis Gastrointestinal Complaints and Symptoms: Negative for: Frequent  diarrhea; Nausea; Vomiting Medical History: Negative for: Cirrhosis ; Colitis; Crohns; Hepatitis A; Hepatitis B; Hepatitis C Endocrine Complaints and Symptoms: Negative for: Heat/cold intolerance Medical History: Negative for: Type I Diabetes; Type II Diabetes Genitourinary Complaints and Symptoms: Negative for: Frequent urination Medical History: Negative for: End Stage Renal Disease Integumentary (Skin) Complaints and Symptoms: Positive for: Wounds Medical History: Negative for: History of Burn Musculoskeletal Complaints and Symptoms: Negative for: Muscle Pain; Muscle Weakness Medical History: Positive for: Rheumatoid Arthritis; Osteoarthritis Negative for: Gout Neurologic Complaints and Symptoms: Negative for: Numbness/parasthesias Medical History: Negative for: Dementia; Neuropathy; Quadriplegia; Paraplegia; Seizure Disorder Psychiatric Complaints and Symptoms: Positive for: Claustrophobia Negative for: Suicidal Medical History: Negative for: Anorexia/bulimia; Confinement Anxiety Hematologic/Lymphatic Medical History: Negative for: Anemia; Hemophilia; Human Immunodeficiency Virus; Lymphedema; Sickle Cell Disease Immunological Medical History: Positive for: Raynauds Negative for: Lupus Erythematosus; Scleroderma Oncologic Medical History: Negative for: Received Chemotherapy; Received Radiation Immunizations Pneumococcal Vaccine: Received Pneumococcal Vaccination: No Implantable Devices No devices added Family and Social History Cancer: No; Diabetes: Yes - Maternal Grandparents; Heart Disease: Yes - Mother; Hereditary Spherocytosis: No; Hypertension: No; Kidney Disease: No; Lung Disease: No; Seizures: No; Stroke: Yes - Maternal Grandparents; Thyroid Problems: Yes - Maternal Grandparents,Mother; Tuberculosis: No; Former smoker; Marital Status - Divorced; Alcohol Use: Never; Drug Use: No History; Caffeine Use: Rarely; Financial Concerns: No; Food, Clothing or  Shelter Needs: No; Support System Lacking: No; Transportation Concerns: No Electronic Signature(s) Signed: 10/05/2020 5:03:49 PM By: Baltazar Najjar MD Signed: 10/06/2020 5:49:00 PM By: Yevonne Pax RN Entered By: Yevonne Pax on 10/02/2020 13:58:14 -------------------------------------------------------------------------------- SuperBill Details Patient Name: Date of Service: Jamie [name] 10/02/2020 Medical Record Number: [id number] Patient Account Number: [id number] Date of Birth/Sex: Treating RN: 08-25-1956 (64 y.o. Female) Cherylin Mylar Primary Care Provider: Barbie Banner Other Clinician: Referring Provider: Treating Provider/Extender: Worthy Rancher Weeks in Treatment: 0 Diagnosis Coding ICD-10 Codes Code Description [phone] Abrasion, right lower leg, subsequent encounter L97.211 Non-pressure chronic ulcer of right calf limited to breakdown of skin Facility Procedures Physician Procedures : CPT4 Code Description Modifier [id number] [phone] - WC PHYS LEVEL 2 - NEW PT ICD-10 Diagnosis Description S80.811D Abrasion, right lower leg, subsequent encounter L97.211 Non-pressure chronic ulcer of right calf limited to breakdown of skin Quantity: 1 Electronic Signature(s) Signed: 10/05/2020 5:03:49 PM By: Baltazar Najjar MD Entered By: Baltazar Najjar on 10/02/2020 15:20:44

## 2020-10-06 NOTE — Progress Notes (Signed)
Jamie, [name] ([id number]) Visit Report for 10/02/2020 Abuse/Suicide Risk Screen Details Patient Name: Date of Service: Jamie [name], Jamie [name] 10/02/2020 1:15 PM Medical Record Number: [id number] Patient Account Number: [id number] Date of Birth/Sex: Treating RN: 1956/08/10 (64 y.o. Female) Yevonne Pax Primary Care Langford Carias: Barbie Banner Other Clinician: Referring Treylan Mcclintock: Treating Sereen Schaff/Extender: Worthy Rancher Weeks in Treatment: 0 Abuse/Suicide Risk Screen Items Answer ABUSE RISK SCREEN: Has anyone close to you tried to hurt or harm you recentlyo No Do you feel uncomfortable with anyone in your familyo No Has anyone forced you do things that you didnt want to doo No Electronic Signature(s) Signed: 10/06/2020 5:49:00 PM By: Yevonne Pax RN Entered By: Yevonne Pax on 10/02/2020 13:58:31 -------------------------------------------------------------------------------- Activities of Daily Living Details Patient Name: Date of Service: Jamie, [name] 10/02/2020 1:15 PM Medical Record Number: [id number] Patient Account Number: [id number] Date of Birth/Sex: Treating RN: 1956-07-03 (64 y.o. Female) Yevonne Pax Primary Care Reniyah Gootee: Barbie Banner Other Clinician: Referring Iyonnah Ferrante: Treating Kashara Blocher/Extender: Worthy Rancher Weeks in Treatment: 0 Activities of Daily Living Items Answer Activities of Daily Living (Please select one for each item) Drive Automobile Completely Able T Medications ake Completely Able Use T elephone Completely Able Care for Appearance Completely Able Use T oilet Completely Able Bath / Shower Completely Able Dress Self Completely Able Feed Self Completely Able Walk Completely Able Get In / Out Bed Completely Able Housework Completely Able Prepare Meals Completely Able Handle Money Completely Able Shop for Self Completely Able Electronic Signature(s) Signed: 10/06/2020 5:49:00 PM By: Yevonne Pax RN Entered By:  Yevonne Pax on 10/02/2020 13:58:59 -------------------------------------------------------------------------------- Education Screening Details Patient Name: Date of Service: Jamie [name]. 10/02/2020 1:15 PM Medical Record Number: [id number] Patient Account Number: [id number] Date of Birth/Sex: Treating RN: 1956/01/10 (64 y.o. Female) Yevonne Pax Primary Care Alegra Rost: Barbie Banner Other Clinician: Referring Johnny Gorter: Treating Hadleigh Felber/Extender: Sharlene Dory in Treatment: 0 Primary Learner Assessed: Patient Learning Preferences/Education Level/Primary Language Learning Preference: Explanation Highest Education Level: College or Above Preferred Language: English Cognitive Barrier Language Barrier: No Translator Needed: No Memory Deficit: No Emotional Barrier: No Cultural/Religious Beliefs Affecting Medical Care: No Physical Barrier Impaired Vision: No Impaired Hearing: No Decreased Hand dexterity: No Knowledge/Comprehension Knowledge Level: Medium Comprehension Level: High Ability to understand written instructions: High Ability to understand verbal instructions: High Motivation Anxiety Level: Anxious Cooperation: Cooperative Education Importance: Acknowledges Need Interest in Health Problems: Asks Questions Perception: Coherent Willingness to Engage in Self-Management High Activities: Readiness to Engage in Self-Management High Activities: Electronic Signature(s) Signed: 10/06/2020 5:49:00 PM By: Yevonne Pax RN Entered By: Yevonne Pax on 10/02/2020 13:59:22 -------------------------------------------------------------------------------- Fall Risk Assessment Details Patient Name: Date of Service: Jamie [name]. 10/02/2020 1:15 PM Medical Record Number: [id number] Patient Account Number: [id number] Date of Birth/Sex: Treating RN: [birth year] (64 y.o. Female) Epps, Carrie Primary Care Kemal Amores: Barbie Banner Other  Clinician: Referring Jadynn Epping: Treating Raven Harmes/Extender: Worthy Rancher Weeks in Treatment: 0 Fall Risk Assessment Items Have you had 2 or more falls in the last 12 monthso 0 Yes Have you had any fall that resulted in injury in the last 12 monthso 0 Yes FALLS RISK SCREEN History of falling - immediate or within 3 months 25 Yes Secondary diagnosis (Do you have 2 or more medical diagnoseso) 0 No Ambulatory aid None/bed rest/wheelchair/nurse 0 No Crutches/cane/walker 0 No Furniture 0 No Intravenous therapy Access/Saline/Heparin Lock 0 No Gait/Transferring Normal/ bed rest/  wheelchair 0 No Weak (short steps with or without shuffle, stooped but able to lift head while walking, may seek 0 No support from furniture) Impaired (short steps with shuffle, may have difficulty arising from chair, head down, impaired 0 No balance) Mental Status Oriented to own ability 0 No Electronic Signature(s) Signed: 10/06/2020 5:49:00 PM By: Yevonne Pax RN Entered By: Yevonne Pax on 10/02/2020 13:59:34 -------------------------------------------------------------------------------- Foot Assessment Details Patient Name: Date of Service: Jamie [name] 10/02/2020 1:15 PM Medical Record Number: [id number] Patient Account Number: [id number] Date of Birth/Sex: Treating RN: [birth year] (64 y.o. Female) Yevonne Pax Primary Care Tanasha Menees: Barbie Banner Other Clinician: Referring Zeyad Delaguila: Treating Patina Spanier/Extender: Worthy Rancher Weeks in Treatment: 0 Foot Assessment Items Site Locations + = Sensation present, - = Sensation absent, C = Callus, U = Ulcer R = Redness, W = Warmth, M = Maceration, PU = Pre-ulcerative lesion F = Fissure, S = Swelling, D = Dryness Assessment Right: Left: Other Deformity: No No Prior Foot Ulcer: No No Prior Amputation: No No Charcot Joint: No No Ambulatory Status: Ambulatory Without Help Gait: Steady Electronic Signature(s) Signed:  10/06/2020 5:49:00 PM By: Yevonne Pax RN Entered By: Yevonne Pax on 10/02/2020 14:01:39 -------------------------------------------------------------------------------- Nutrition Risk Screening Details Patient Name: Date of Service: Jamie, [name] 10/02/2020 1:15 PM Medical Record Number: [id number] Patient Account Number: [id number] Date of Birth/Sex: Treating RN: 11/16/1956 (64 y.o. Female) Epps, Carrie Primary Care Miyana Mordecai: Barbie Banner Other Clinician: Referring Rudolf Blizard: Treating Stonewall Doss/Extender: Worthy Rancher Weeks in Treatment: 0 Height (in): 63 Weight (lbs): 115 Body Mass Index (BMI): 20.4 Nutrition Risk Screening Items Score Screening NUTRITION RISK SCREEN: I have an illness or condition that made me change the kind and/or amount of food I eat 0 No I eat fewer than two meals per day 0 No I eat few fruits and vegetables, or milk products 0 No I have three or more drinks of beer, liquor or wine almost every day 0 No I have tooth or mouth problems that make it hard for me to eat 0 No I don't always have enough money to buy the food I need 0 No I eat alone most of the time 0 No I take three or more different prescribed or over-the-counter drugs a day 1 Yes Without wanting to, I have lost or gained 10 pounds in the last six months 0 No I am not always physically able to shop, cook and/or feed myself 2 Yes Nutrition Protocols Good Risk Protocol Moderate Risk Protocol High Risk Proctocol Risk Level: Moderate Risk Score: 3 Electronic Signature(s) Signed: 10/06/2020 5:49:00 PM By: Yevonne Pax RN Entered By: Yevonne Pax on 10/02/2020 14:00:07

## 2020-10-09 ENCOUNTER — Encounter (HOSPITAL_BASED_OUTPATIENT_CLINIC_OR_DEPARTMENT_OTHER): Payer: Medicare HMO | Admitting: Internal Medicine

## 2020-10-13 ENCOUNTER — Other Ambulatory Visit: Payer: Self-pay | Admitting: Family Medicine

## 2020-10-13 DIAGNOSIS — M8580 Other specified disorders of bone density and structure, unspecified site: Secondary | ICD-10-CM

## 2020-10-20 ENCOUNTER — Encounter (HOSPITAL_BASED_OUTPATIENT_CLINIC_OR_DEPARTMENT_OTHER): Payer: Medicare HMO | Admitting: Internal Medicine

## 2020-10-27 ENCOUNTER — Encounter (HOSPITAL_BASED_OUTPATIENT_CLINIC_OR_DEPARTMENT_OTHER): Payer: Medicare HMO | Admitting: Internal Medicine

## 2021-01-28 ENCOUNTER — Other Ambulatory Visit: Payer: Medicare HMO

## 2021-05-02 ENCOUNTER — Other Ambulatory Visit: Payer: Self-pay

## 2021-05-02 ENCOUNTER — Encounter (HOSPITAL_BASED_OUTPATIENT_CLINIC_OR_DEPARTMENT_OTHER): Payer: Self-pay

## 2021-05-02 ENCOUNTER — Emergency Department (HOSPITAL_BASED_OUTPATIENT_CLINIC_OR_DEPARTMENT_OTHER): Payer: Medicare HMO | Admitting: Radiology

## 2021-05-02 ENCOUNTER — Emergency Department (HOSPITAL_BASED_OUTPATIENT_CLINIC_OR_DEPARTMENT_OTHER)
Admission: EM | Admit: 2021-05-02 | Discharge: 2021-05-02 | Disposition: A | Discharge status: Home / Self Care | Payer: Medicare HMO | Attending: Emergency Medicine | Admitting: Emergency Medicine

## 2021-05-02 ENCOUNTER — Emergency Department (HOSPITAL_BASED_OUTPATIENT_CLINIC_OR_DEPARTMENT_OTHER): Payer: Medicare HMO

## 2021-05-02 DIAGNOSIS — M25562 Pain in left knee: Secondary | ICD-10-CM | POA: Insufficient documentation

## 2021-05-02 DIAGNOSIS — F1010 Alcohol abuse, uncomplicated: Secondary | ICD-10-CM | POA: Diagnosis not present

## 2021-05-02 DIAGNOSIS — R531 Weakness: Secondary | ICD-10-CM | POA: Insufficient documentation

## 2021-05-02 DIAGNOSIS — W19XXXA Unspecified fall, initial encounter: Secondary | ICD-10-CM | POA: Diagnosis not present

## 2021-05-02 DIAGNOSIS — M25561 Pain in right knee: Secondary | ICD-10-CM | POA: Diagnosis not present

## 2021-05-02 DIAGNOSIS — S59911A Unspecified injury of right forearm, initial encounter: Secondary | ICD-10-CM | POA: Diagnosis present

## 2021-05-02 DIAGNOSIS — Z79899 Other long term (current) drug therapy: Secondary | ICD-10-CM | POA: Insufficient documentation

## 2021-05-02 DIAGNOSIS — S51811A Laceration without foreign body of right forearm, initial encounter: Secondary | ICD-10-CM | POA: Diagnosis not present

## 2021-05-02 DIAGNOSIS — S0083XA Contusion of other part of head, initial encounter: Secondary | ICD-10-CM | POA: Diagnosis not present

## 2021-05-02 DIAGNOSIS — Y905 Blood alcohol level of 100-119 mg/100 ml: Secondary | ICD-10-CM | POA: Diagnosis not present

## 2021-05-02 DIAGNOSIS — F1320 Sedative, hypnotic or anxiolytic dependence, uncomplicated: Secondary | ICD-10-CM | POA: Insufficient documentation

## 2021-05-02 HISTORY — DX: Other chronic pain: G89.29

## 2021-05-02 LAB — CBC WITH DIFFERENTIAL/PLATELET
Abs Immature Granulocytes: 0.04 10*3/uL (ref 0.00–0.07)
Basophils Absolute: 0.1 10*3/uL (ref 0.0–0.1)
Basophils Relative: 1 %
Eosinophils Absolute: 0.1 10*3/uL (ref 0.0–0.5)
Eosinophils Relative: 1 %
HCT: 40.6 % (ref 36.0–46.0)
Hemoglobin: 13.2 g/dL (ref 12.0–15.0)
Immature Granulocytes: 0 %
Lymphocytes Relative: 24 %
Lymphs Abs: 2.2 10*3/uL (ref 0.7–4.0)
MCH: 30.5 pg (ref 26.0–34.0)
MCHC: 32.5 g/dL (ref 30.0–36.0)
MCV: 93.8 fL (ref 80.0–100.0)
Monocytes Absolute: 0.8 10*3/uL (ref 0.1–1.0)
Monocytes Relative: 8 %
Neutro Abs: 6.1 10*3/uL (ref 1.7–7.7)
Neutrophils Relative %: 66 %
Platelets: 395 10*3/uL (ref 150–400)
RBC: 4.33 MIL/uL (ref 3.87–5.11)
RDW: 14.1 % (ref 11.5–15.5)
WBC: 9.2 10*3/uL (ref 4.0–10.5)
nRBC: 0 % (ref 0.0–0.2)

## 2021-05-02 LAB — URINALYSIS, ROUTINE W REFLEX MICROSCOPIC
Bilirubin Urine: NEGATIVE
Glucose, UA: NEGATIVE mg/dL
Ketones, ur: 15 mg/dL — AB
Leukocytes,Ua: NEGATIVE
Nitrite: NEGATIVE
Specific Gravity, Urine: 1.012 (ref 1.005–1.030)
pH: 5 (ref 5.0–8.0)

## 2021-05-02 LAB — SALICYLATE LEVEL: Salicylate Lvl: <7 mg/dL — ABNORMAL LOW (ref 7.0–30.0)

## 2021-05-02 LAB — COMPREHENSIVE METABOLIC PANEL
ALT: 39 U/L (ref 0–44)
AST: 117 U/L — ABNORMAL HIGH (ref 15–41)
Albumin: 3.9 g/dL (ref 3.5–5.0)
Alkaline Phosphatase: 97 U/L (ref 38–126)
Anion gap: 16 — ABNORMAL HIGH (ref 5–15)
BUN: 9 mg/dL (ref 8–23)
CO2: 22 mmol/L (ref 22–32)
Calcium: 9.4 mg/dL (ref 8.9–10.3)
Chloride: 101 mmol/L (ref 98–111)
Creatinine, Ser: 0.72 mg/dL (ref 0.44–1.00)
GFR, Estimated: >60 mL/min (ref >60)
Glucose, Bld: 59 mg/dL — ABNORMAL LOW (ref 70–99)
Potassium: 3.9 mmol/L (ref 3.5–5.1)
Sodium: 139 mmol/L (ref 135–145)
Total Bilirubin: 0.3 mg/dL (ref 0.3–1.2)
Total Protein: 7.1 g/dL (ref 6.5–8.1)

## 2021-05-02 LAB — RAPID URINE DRUG SCREEN, HOSP PERFORMED
Amphetamines: NOT DETECTED
Barbiturates: POSITIVE — AB
Benzodiazepines: POSITIVE — AB
Cocaine: NOT DETECTED
Opiates: NOT DETECTED
Tetrahydrocannabinol: NOT DETECTED

## 2021-05-02 LAB — TROPONIN I (HIGH SENSITIVITY)
Troponin I (High Sensitivity): 23 ng/L — ABNORMAL HIGH (ref <18)
Troponin I (High Sensitivity): 17 ng/L (ref <18)

## 2021-05-02 LAB — CBG MONITORING, ED
Glucose-Capillary: 57 mg/dL — ABNORMAL LOW (ref 70–99)
Glucose-Capillary: 99 mg/dL (ref 70–99)

## 2021-05-02 LAB — MAGNESIUM: Magnesium: 1.6 mg/dL — ABNORMAL LOW (ref 1.7–2.4)

## 2021-05-02 LAB — ETHANOL: Alcohol, Ethyl (B): 108 mg/dL — ABNORMAL HIGH (ref <10)

## 2021-05-02 LAB — ACETAMINOPHEN LEVEL: Acetaminophen (Tylenol), Serum: 11 ug/mL (ref 10–30)

## 2021-05-02 MED ORDER — DEXTROSE 50 % IV SOLN: 1.0000 | Freq: Once | INTRAVENOUS | Status: DC

## 2021-05-02 MED ORDER — TETANUS-DIPHTH-ACELL PERTUSSIS 5-2.5-18.5 LF-MCG/0.5 IM SUSY
0.5000 mL | PREFILLED_SYRINGE | Freq: Once | INTRAMUSCULAR | Status: DC
  Filled 2021-05-02: qty 0.5

## 2021-05-02 MED ORDER — SODIUM CHLORIDE 0.9 % IV BOLUS
500.0000 mL | Freq: Once | INTRAVENOUS | Status: AC
  Administered 2021-05-02: 500 mL via INTRAVENOUS

## 2021-05-02 MED ORDER — DEXTROSE 50 % IV SOLN
INTRAVENOUS | Status: AC
  Filled 2021-05-02: qty 50

## 2021-05-02 NOTE — ED Provider Notes (Signed)
Care of the patient assumed at the change of shift. Patient with history of alcohol and benzo use has had several falls recently. She was brought to the ED today by daughter due to her poor personal hygiene and living conditions as well as for evaluation of recent falls.   Physical Exam  BP (!) 146/73   Pulse 70   Temp 97.8 F (36.6 C) (Oral)   Resp 17   Ht 5\' 6"  (1.676 m)   Wt 56.2 kg   SpO2 100%   BMI 20.01 kg/m   Physical Exam  ED Course/Procedures   Clinical Course as of 05/02/21 1800  Sun May 02, 2021  1609 Patient's labs reviewed. CBC is unremarkable. CMP with mild hypoglycemia, will give D50 and feed her. Initial Trop is mildly elevated of unclear significance. EtOH is elevated, ASA/APAP are neg. Magnesium is borderline low. UA neg for infection. UDS is positive for benzos and barbs.  [CS]  1610 CT head, C-spine and face are neg for acute injury.  [CS]  1611 Xrays are neg for signs of acute injury.  [CS]  1713 Repeat CBG hasn't crossed into Epic but it is improved to 99.  [CS]  1756 Repeat Trop is neg. Patient denies any chest pain or other ACS symptoms. Reviewed labs and imaging results with the patient and daughter at bedside. Patient is adamant she is ready to go home. Does not want any other help, offered outpatient substance abuse resources but she has indicated she has not intention of availing her self of those resources. She was encouraged to discuss her alcohol use with her pain management doctors at [facility].  [CS]    Clinical Course User Index [CS] [facility], MD    Procedures  MDM         Pollyann Savoy, MD 05/02/21 1800

## 2021-05-02 NOTE — ED Notes (Signed)
Repeat cbg at 1653 was 99.   pts has skin tears to right posterior forearm, cleaned and covered with mepilex. Reviewed skin care with patient and her daughter.

## 2021-05-02 NOTE — ED Provider Notes (Signed)
[facility] EMERGENCY DEPT Provider Note   CSN: [id number] Arrival date & time: 05/02/21  1332     History Chief Complaint  Patient presents with  . Fall    Jamie [name] is a 65 y.o. female.  Jamie [name] presents for ED evaluation of frequent and recurrent falls.  She is accompanied by her daughter who also provides the history.  Today's ED visit was prompted by the daughter finding her mother naked aside from a bathrobe in the house.  Patient was on the floor.  She has fallen multiple times over the past few weeks and even months.  Yesterday, police were called because the patient had a verbal altercation with her mother.  This was over the phone, and the patient's mother states that the patient was suicidal.  Therefore, police were called.  Patient denies that she was in fact suicidal, and she was very upset that the police were there because she has been involuntarily committed in the past.  She did not answer the door, and they had to kick the garage door in.  At this point, the patient's daughter was called, and the patient's daughter arrived to investigate the situation.  The patient went back home to [place], and she came back today.  She found her mother not doing well, and her mother had not had anything to eat in the past 2 days.  She was concerned about possible alcohol use because the patient smelled as if she had been drinking.  The patient is very resistant to obtaining any help with her medical problems and often refuses to seek medical treatment after falling or other injuries.  She states that she does not want anybody to come into the home.  While she has a walker, she also cannot use it because her home is so full of clutter.  Again, she denies any help with this issue despite family members being willing to help her clear her home.  The history is provided by the patient.  Fall This is a chronic problem. Episode onset: daily for weeks. The problem occurs  daily. The problem has been gradually worsening. Pertinent negatives include no chest pain, no abdominal pain, no headaches and no shortness of breath. The symptoms are aggravated by walking. Nothing relieves the symptoms. Treatments tried: cane. The treatment provided no relief.       Past Medical History:  Diagnosis Date  . Anxiety   . Arthritis   . Chronic pain   . Gastroenteritis   . Headache   . Hypotension   . Insomnia   . Suicidal ideation     Patient Active Problem List   Diagnosis Date Noted  . Abdominal pain   . Nausea vomiting and diarrhea 05/08/2016  . Sepsis, unspecified organism (HCC) 05/08/2016  . Migraine 05/08/2016  . AKI (acute kidney injury) (HCC) 05/08/2016  . 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 Anxiety 05/08/2016  . Sepsis (HCC) 05/08/2016    Past Surgical History:  Procedure Laterality Date  . BACK SURGERY    . CARPAL TUNNEL RELEASE    . LAPAROSCOPIC OOPHERECTOMY    . MOUTH SURGERY       OB History   No obstetric history on file.     Family History  Problem Relation Age of Onset  . Colon cancer Neg Hx     Social History   Tobacco Use  . Smoking status: Never Smoker  . Smokeless tobacco: Never Used  Vaping Use  . Vaping Use:  Never used  Substance Use Topics  . Alcohol use: Not Currently  . Drug use: Never    Home Medications Prior to Admission medications   Medication Sig Start Date End Date Taking? Authorizing Provider  ALPRAZolam Prudy Feeler) 1 MG tablet Take 1-2 mg by mouth 4 (four) times daily as needed for anxiety or sleep. 1mg  4 times daily as needed for anxiety and an additional 2mg  at bedtime for sleep.    [provider]  Ascorbic Acid (VITAMIN C PO) Take 1 capsule by mouth daily.    [provider]  b complex vitamins tablet Take 5 tablets by mouth daily.    [provider]  butalbital-acetaminophen-caffeine (FIORICET, ESGIC) 50-325-40 MG tablet Take 2 tablets by mouth every 4 (four) hours as needed  for migraine.  05/04/16   [provider]  Calcium Carb-Cholecalciferol (CALCIUM + D3 PO) Take 1 capsule by mouth daily.    [provider]  cetirizine (ZYRTEC) 10 MG tablet Take 10 mg by mouth daily.    [provider]  metoCLOPramide (REGLAN) 10 MG tablet Take 10 mg by mouth every 6 (six) hours as needed for nausea or vomiting.    [provider]  Multiple Vitamin (MULTIVITAMIN) tablet Take 1 tablet by mouth daily.    [provider]  oxyCODONE (OXYCONTIN) 20 mg 12 hr tablet Take 20 mg by mouth every 12 (twelve) hours as needed (pain).     [provider]  oxymetazoline (AFRIN) 0.05 % nasal spray Place 2 sprays into both nostrils daily as needed for congestion.    [provider]  polyvinyl alcohol (LIQUIFILM TEARS) 1.4 % ophthalmic solution Place 1-2 drops into both eyes as needed for dry eyes.    [provider]  Probiotic Product (PROBIOTIC-10 PO) Take 1 tablet by mouth daily.    [provider]  tizanidine (ZANAFLEX) 2 MG capsule Take 2.5 mg by mouth 3 (three) times daily.    [provider]  zaleplon (SONATA) 10 MG capsule 1 capsule at bedtime. 06/30/16   [provider]    Allergies    Amoxicillin-pot clavulanate, Azithromycin, Bupropion, Nsaids, Prednisone, and Vilazodone  Review of Systems   Review of Systems  Constitutional: Positive for unexpected weight change. Negative for chills and fever.       Weight loss  HENT: Negative for ear pain and sore throat.   Eyes: Negative for pain and visual disturbance.  Respiratory: Negative for cough and shortness of breath.   Cardiovascular: Negative for chest pain and palpitations.  Gastrointestinal: Negative for abdominal pain, nausea and vomiting.  Genitourinary: Negative for dysuria and hematuria.  Musculoskeletal: Negative for arthralgias and back pain.  Skin: Negative for color change and rash.  Neurological: Negative for seizures, syncope  and headaches.  Psychiatric/Behavioral: Negative for suicidal ideas.  All other systems reviewed and are negative.   Physical Exam Updated Vital Signs BP (!) 166/86 (BP Location: Right Arm)   Pulse 68   Temp 97.8 F (36.6 C) (Oral)   Resp 20   Ht 5\' 6"  (1.676 m)   Wt 56.2 kg   SpO2 100%   BMI 20.01 kg/m   Physical Exam Constitutional:      Appearance: She is ill-appearing.     Comments: listless  HENT:     Head:     Comments: Bruising at the tip of the chin    Mouth/Throat:     Mouth: Mucous membranes are dry.     Comments: Petechiae at  the posterior aspect of the pharynx Poor dentition Musculoskeletal:     Cervical back: No tenderness.     Comments: The patient has multiple musculoskeletal injuries.  She has bruising on the dorsum of the right forearm associated with some shallow skin tears.  She has extensive bruising at the lateral thigh on the right side.  She also has diffuse tenderness to palpation about both knees and some limited range of motion.  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  Spine is normal in alignment, and there is no vertebral tenderness.  Skin:    Findings: Bruising present.     Comments: Tented, dry skin  Neurological:     General: No focal deficit present.     Cranial Nerves: No cranial nerve deficit.     Sensory: No sensory deficit.     Motor: Weakness (diffuse; requires support to walk) present.  Psychiatric:     Comments: The patient does not appear to be intoxicated.  She is alert and oriented.  She appears to understand her circumstances, but she is not interested in help.  She lacks insight into her condition.  However, I do think she has the capacity to make her medical decisions.     ED Results / Procedures / Treatments   Labs (all labs ordered are listed, but only abnormal results are displayed) Labs Reviewed  COMPREHENSIVE METABOLIC PANEL - Abnormal; Notable for the following components:       Result Value   Glucose, Bld 59 (*)    AST 117 (*)    Anion gap 16 (*)    All other components within normal limits  ETHANOL - Abnormal; Notable for the following components:   Alcohol, Ethyl (B) 108 (*)    All other components within normal limits  SALICYLATE LEVEL - Abnormal; Notable for the following components:   Salicylate Lvl <7.0 (*)    All other components within normal limits  MAGNESIUM - Abnormal; Notable for the following components:   Magnesium 1.6 (*)    All other components within normal limits  TROPONIN I (HIGH SENSITIVITY) - Abnormal; Notable for the following components:   Troponin I (High Sensitivity) 23 (*)    All other components within normal limits  ACETAMINOPHEN LEVEL  CBC WITH DIFFERENTIAL/PLATELET  URINALYSIS, ROUTINE W REFLEX MICROSCOPIC  RAPID URINE DRUG SCREEN, HOSP PERFORMED    EKG None  Radiology No results found.  Procedures Procedures   Medications Ordered in ED Medications  sodium chloride 0.9 % bolus 500 mL (has no administration in time range)  Tdap (BOOSTRIX) injection 0.5 mL (has no administration in time range)    ED Course  I have reviewed the triage vital signs and the nursing notes.  Pertinent labs & imaging results that were available during my care of the patient were reviewed by me and considered in my medical decision making (see chart for details).    MDM Rules/Calculators/A&P                          MIANI ALBACH presents with her daughter.  She has had frequent falls, and she appears to be displaying a lack of self-care that is potentially affecting her health.  The patient and her daughter and I talked.  I told him that we would evaluate for acute abnormality such as trauma from her frequent falls, metabolic abnormalities, evidence of infection, and/or evidence of intoxication or toxicologic problem.  I did tell the patient that if we were happy to offer services such as home health referrals and transition of care at  discharge.  However, the patient was equivocal about accepting such help and support.  She did understand that if she goes back home into the same environment without any changes she is at risk for worsened injury or even death.  I also told her that she should be transparent with her prescribing physician that she also uses alcohol in addition to taking her benzodiazepines.  Certainly this combination is putting her at risk for falling.  However, it does seem that she has the capacity to make her health decisions, and unfortunately she is very resistant to making lifestyle changes that would keep her safer. Final Clinical Impression(s) / ED Diagnoses Final diagnoses:  Alcohol abuse  Benzodiazepine dependence (HCC)  Fall, initial encounter    Rx / DC Orders ED Discharge Orders    None       Koleen Distance, MD 05/02/21 1539

## 2021-05-02 NOTE — ED Notes (Signed)
Patient transported to CT 

## 2021-05-02 NOTE — ED Notes (Signed)
Pt dc home. Pt refused tdap.

## 2021-05-02 NOTE — ED Triage Notes (Signed)
Pt BIB family with reports of weakness, frequent falls, and ETOH use. Family states pt has not been taking care of her self. Pt is A/Ox4 on arrival.

## 2021-05-19 ENCOUNTER — Other Ambulatory Visit: Payer: Self-pay

## 2021-05-19 ENCOUNTER — Emergency Department (HOSPITAL_COMMUNITY): Payer: Medicare HMO

## 2021-05-19 ENCOUNTER — Emergency Department (HOSPITAL_COMMUNITY)
Admission: EM | Admit: 2021-05-19 | Discharge: 2021-05-19 | Discharge status: Against Medical Advice | Payer: Medicare HMO | Attending: Emergency Medicine | Admitting: Emergency Medicine

## 2021-05-19 ENCOUNTER — Encounter (HOSPITAL_COMMUNITY): Payer: Self-pay | Admitting: Emergency Medicine

## 2021-05-19 DIAGNOSIS — R531 Weakness: Secondary | ICD-10-CM | POA: Diagnosis present

## 2021-05-19 DIAGNOSIS — Z20822 Contact with and (suspected) exposure to covid-19: Secondary | ICD-10-CM | POA: Insufficient documentation

## 2021-05-19 DIAGNOSIS — R4182 Altered mental status, unspecified: Secondary | ICD-10-CM | POA: Diagnosis not present

## 2021-05-19 DIAGNOSIS — E876 Hypokalemia: Secondary | ICD-10-CM | POA: Insufficient documentation

## 2021-05-19 DIAGNOSIS — W1839XA Other fall on same level, initial encounter: Secondary | ICD-10-CM | POA: Insufficient documentation

## 2021-05-19 DIAGNOSIS — Z5321 Procedure and treatment not carried out due to patient leaving prior to being seen by health care provider: Secondary | ICD-10-CM | POA: Diagnosis not present

## 2021-05-19 LAB — CBC
HCT: 46.6 % — ABNORMAL HIGH (ref 36.0–46.0)
Hemoglobin: 14.7 g/dL (ref 12.0–15.0)
MCH: 31.4 pg (ref 26.0–34.0)
MCHC: 31.5 g/dL (ref 30.0–36.0)
MCV: 99.6 fL (ref 80.0–100.0)
Platelets: 257 10*3/uL (ref 150–400)
RBC: 4.68 MIL/uL (ref 3.87–5.11)
RDW: 14.7 % (ref 11.5–15.5)
WBC: 6.8 10*3/uL (ref 4.0–10.5)
nRBC: 0 % (ref 0.0–0.2)

## 2021-05-19 LAB — CK: Total CK: 50 U/L (ref 38–234)

## 2021-05-19 LAB — ETHANOL: Alcohol, Ethyl (B): <10 mg/dL (ref <10)

## 2021-05-19 LAB — BASIC METABOLIC PANEL
Anion gap: 9 (ref 5–15)
BUN: 13 mg/dL (ref 8–23)
CO2: 25 mmol/L (ref 22–32)
Calcium: 8.8 mg/dL — ABNORMAL LOW (ref 8.9–10.3)
Chloride: 108 mmol/L (ref 98–111)
Creatinine, Ser: 0.74 mg/dL (ref 0.44–1.00)
GFR, Estimated: >60 mL/min (ref >60)
Glucose, Bld: 90 mg/dL (ref 70–99)
Potassium: 2.7 mmol/L — CL (ref 3.5–5.1)
Sodium: 142 mmol/L (ref 135–145)

## 2021-05-19 LAB — HEPATIC FUNCTION PANEL
ALT: 12 U/L (ref 0–44)
AST: 16 U/L (ref 15–41)
Albumin: 3.3 g/dL — ABNORMAL LOW (ref 3.5–5.0)
Alkaline Phosphatase: 77 U/L (ref 38–126)
Bilirubin, Direct: 0.1 mg/dL (ref 0.0–0.2)
Indirect Bilirubin: 0.3 mg/dL (ref 0.3–0.9)
Total Bilirubin: 0.4 mg/dL (ref 0.3–1.2)
Total Protein: 6.3 g/dL — ABNORMAL LOW (ref 6.5–8.1)

## 2021-05-19 LAB — CBG MONITORING, ED: Glucose-Capillary: 79 mg/dL (ref 70–99)

## 2021-05-19 LAB — RESP PANEL BY RT-PCR (FLU A&B, COVID) ARPGX2
Influenza A by PCR: NEGATIVE
Influenza B by PCR: NEGATIVE
SARS Coronavirus 2 by RT PCR: NEGATIVE

## 2021-05-19 LAB — AMMONIA: Ammonia: 10 umol/L (ref 9–35)

## 2021-05-19 MED ORDER — SODIUM CHLORIDE 0.9 % IV SOLN
1000.0000 mL | INTRAVENOUS | Status: DC
  Administered 2021-05-19: 1000 mL via INTRAVENOUS

## 2021-05-19 MED ORDER — POTASSIUM CHLORIDE ER 10 MEQ PO TBCR: 10.0000 meq | EXTENDED_RELEASE_TABLET | Freq: Two times a day (BID) | ORAL | 0 refills | Status: DC

## 2021-05-19 MED ORDER — SODIUM CHLORIDE 0.9 % IV BOLUS (SEPSIS)
1000.0000 mL | Freq: Once | INTRAVENOUS | Status: AC
  Administered 2021-05-19: 1000 mL via INTRAVENOUS

## 2021-05-19 MED ORDER — POTASSIUM CHLORIDE 10 MEQ/100ML IV SOLN: 10.0000 meq | INTRAVENOUS | Status: DC

## 2021-05-19 NOTE — ED Notes (Signed)
Helped NT JW clean pt had stool on cloths and bed

## 2021-05-19 NOTE — ED Notes (Signed)
Patient transported to CT 

## 2021-05-19 NOTE — Discharge Instructions (Addendum)
Please follow-up with your doctor to be rechecked.  Return to the ED if you change your mind about being evaluated further

## 2021-05-19 NOTE — ED Triage Notes (Signed)
Per EMS-pt from home, lives alone, chronic ETOH, SA with opioids and xanax. Pt found on couch by daughter today, weak, alert and oriented, sleepy. Pt reports quick drinking x 2 weeks ago cold [place]. Last spoke with daughter x 1 week ago per EMS.

## 2021-05-19 NOTE — ED Notes (Signed)
Pt wheeled out to car.  

## 2021-05-19 NOTE — ED Provider Notes (Signed)
[facility] EMERGENCY DEPARTMENT Provider Note   CSN: [id number] Arrival date & time: 05/19/21  1403     History Chief Complaint  Patient presents with  . Weakness    Jamie [name] is a 65 y.o. female.  HPI   Pt was found by family this morning on the floor.  She had fallen and had been lying in her own feces on the sofa.  She had a similar episode recently and was found to be using alcohol and benzodiazepines.  Pt is on chronic benzodiazepines and pain medications.  Family has contacted social services in the past but was deemed competent.  Patient also had an inpatient treatment in the past but that was not very helpful.  Pt  had an episode where she got lost drove to another town and was pulled over by police for reckless driving.  The patient was allowed to stay overnight in hotel and the next morning she was back to normal.  Patient told her family that she stopped drinking 2 weeks ago.  This morning however she was extremely restless.  Noted previously she was lying in her feces.  She was too weak to get up.  She has been very somnolent here in the ED.  Patient is sleeping and snoring and will start to wake up when I ask her questions but will not back off.  She is able to tell me that she is in the hospital.  She does not know the year.  She is not really able to provide any other details  Past Medical History:  Diagnosis Date  . Anxiety   . Arthritis   . Chronic pain   . Gastroenteritis   . Headache   . Hypotension   . Insomnia   . Suicidal ideation     Patient Active Problem List   Diagnosis Date Noted  . Abdominal pain   . Nausea vomiting and diarrhea 05/08/2016  . Sepsis, unspecified organism (HCC) 05/08/2016  . Migraine 05/08/2016  . AKI (acute kidney injury) (HCC) 05/08/2016  . 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 Anxiety 05/08/2016  . Sepsis (HCC) 05/08/2016    Past Surgical History:  Procedure Laterality Date  . BACK SURGERY    . CARPAL TUNNEL RELEASE    .  LAPAROSCOPIC OOPHERECTOMY    . MOUTH SURGERY       OB History   No obstetric history on file.     Family History  Problem Relation Age of Onset  . Colon cancer Neg Hx     Social History   Tobacco Use  . Smoking status: Never Smoker  . Smokeless tobacco: Never Used  Vaping Use  . Vaping Use: Never used  Substance Use Topics  . Alcohol use: Not Currently  . Drug use: Never    Home Medications Prior to Admission medications   Medication Sig Start Date End Date Taking? Authorizing Provider  ALPRAZolam Prudy Feeler) 1 MG tablet Take 1-2 mg by mouth 4 (four) times daily as needed for anxiety or sleep.  4 times daily as needed for anxiety and an additional  at bedtime for sleep.   Yes [provider]  Ascorbic Acid (VITAMIN C PO) Take 1 capsule by mouth daily.   Yes [provider]  butalbital-acetaminophen-caffeine (FIORICET, ESGIC) 50-325-40 MG tablet Take 2 tablets by mouth every 4 (four) hours as needed for migraine.  05/04/16  Yes [provider]  Calcium Carb-Cholecalciferol (CALCIUM + D3 PO) Take 1 capsule by mouth daily.  Yes [provider]  cetirizine (ZYRTEC) 10 MG tablet Take 10 mg by mouth daily.   Yes [provider]  dicyclomine (BENTYL) 20 MG tablet Take 20 mg by mouth 4 (four) times daily as needed. 04/15/21  Yes [provider]  metoCLOPramide (REGLAN) 10 MG tablet Take 10 mg by mouth every 6 (six) hours as needed for nausea or vomiting.   Yes [provider]  oxyCODONE (OXYCONTIN) 20 mg 12 hr tablet Take 20 mg by mouth every 12 (twelve) hours as needed (pain).   Yes [provider]  oxymetazoline (AFRIN) 0.05 % nasal spray Place 2 sprays into both nostrils daily as needed for congestion.   Yes [provider]  polyvinyl alcohol (LIQUIFILM TEARS) 1.4 % ophthalmic solution Place 1-2 drops into both eyes as needed for dry eyes.   Yes [provider]  potassium chloride (KLOR-CON)  10 MEQ tablet Take 1 tablet (10 mEq total) by mouth 2 (two) times daily for 7 days. 05/19/21 05/26/21 Yes Linwood Dibbles, MD  tizanidine (ZANAFLEX) 6 MG capsule Take 6 mg by mouth 3 (three) times daily. 05/14/21  Yes [provider]  b complex vitamins tablet Take 5 tablets by mouth daily. Patient not taking: Reported on 05/19/2021    [provider]  Multiple Vitamin (MULTIVITAMIN) tablet Take 1 tablet by mouth daily. Patient not taking: Reported on 05/19/2021    [provider]  nortriptyline (PAMELOR) 10 MG capsule TAKE 1 CAPSULE BY MOUTH EVERYDAY AT BEDTIME Patient not taking: Reported on 05/19/2021 04/10/21   [provider]  Probiotic Product (PROBIOTIC-10 PO) Take 1 tablet by mouth daily.    [provider]  tizanidine (ZANAFLEX) 2 MG capsule Take 2.5 mg by mouth 3 (three) times daily. Patient not taking: Reported on 05/19/2021    [provider]  zaleplon (SONATA) 10 MG capsule 1 capsule at bedtime. Patient not taking: Reported on 05/19/2021 06/30/16   [provider]    Allergies    Amoxicillin-pot clavulanate, Azithromycin, Bupropion, Nsaids, Prednisone, and Vilazodone  Review of Systems   Review of Systems  Unable to perform ROS: Mental status change  All other systems reviewed and are negative.   Physical Exam Updated Vital Signs BP (!) 162/78   Pulse 76   Temp 97.8 F (36.6 C)   Resp 18   Ht 1.651 m (5\' 5" )   Wt 54.4 kg   SpO2 99%   BMI 19.97 kg/m   Physical Exam Vitals and nursing note reviewed.  Constitutional:      General: She is not in acute distress.    Appearance: She is well-developed.  HENT:     Head: Normocephalic and atraumatic.     Right Ear: External ear normal.     Left Ear: External ear normal.  Eyes:     General: No scleral icterus.       Right eye: No discharge.        Left eye: No discharge.     Conjunctiva/sclera: Conjunctivae normal.  Neck:     Trachea: No tracheal deviation.   Cardiovascular:     Rate and Rhythm: Normal rate and regular rhythm.  Pulmonary:     Effort: Pulmonary effort is normal. No respiratory distress.     Breath sounds: Normal breath sounds. No stridor. No wheezing or rales.  Abdominal:     General: Bowel sounds are normal. There is no distension.     Palpations: Abdomen is soft.     Tenderness:  There is no abdominal tenderness. There is no guarding or rebound.  Musculoskeletal:        General: No tenderness.     Cervical back: Neck supple.  Skin:    General: Skin is warm and dry.     Findings: No rash.  Neurological:     Mental Status: She is alert.     GCS: GCS eye subscore is 3. GCS verbal subscore is 4. GCS motor subscore is 6.     Cranial Nerves: No cranial nerve deficit (no facial droop, extraocular movements intact, no slurred speech).     Sensory: No sensory deficit.     Motor: Weakness (generalized) present. No abnormal muscle tone or seizure activity.     ED Results / Procedures / Treatments   Labs (all labs ordered are listed, but only abnormal results are displayed) Labs Reviewed  BASIC METABOLIC PANEL - Abnormal; Notable for the following components:      Result Value   Potassium 2.7 (*)    Calcium 8.8 (*)    All other components within normal limits  CBC - Abnormal; Notable for the following components:   HCT 46.6 (*)    All other components within normal limits  HEPATIC FUNCTION PANEL - Abnormal; Notable for the following components:   Total Protein 6.3 (*)    Albumin 3.3 (*)    All other components within normal limits  RESP PANEL BY RT-PCR (FLU A&B, COVID) ARPGX2  AMMONIA  ETHANOL  CK  URINALYSIS, ROUTINE W REFLEX MICROSCOPIC  RAPID URINE DRUG SCREEN, HOSP PERFORMED  MAGNESIUM  CBG MONITORING, ED    EKG EKG Interpretation  Date/Time:  Wednesday May 19 2021 14:56:24 EDT Ventricular Rate:  68 PR Interval:  166 QRS Duration: 71 QT Interval:  465 QTC Calculation: 495 R Axis:   47 Text  Interpretation: Sinus rhythm Anterior infarct, old Confirmed by Norman Clay ([id number]) on 05/19/2021 3:04:02 PM   Radiology CT Head Wo Contrast  Result Date: 05/19/2021 CLINICAL DATA:  Altered mental status. EXAM: CT HEAD WITHOUT CONTRAST TECHNIQUE: Contiguous axial images were obtained from the base of the skull through the vertex without intravenous contrast. COMPARISON:  May 02, 2021. FINDINGS: Brain: No evidence of acute infarction, hemorrhage, hydrocephalus, extra-axial collection or mass lesion/mass effect. Vascular: No hyperdense vessel or unexpected calcification. Skull: Normal. Negative for fracture or focal lesion. Sinuses/Orbits: No acute finding. Other: None. IMPRESSION: No acute intracranial abnormality seen. Electronically Signed   By: Lupita Raider M.D.   On: 05/19/2021 16:18   CT Cervical Spine Wo Contrast  Result Date: 05/19/2021 CLINICAL DATA:  Neck trauma. EXAM: CT CERVICAL SPINE WITHOUT CONTRAST TECHNIQUE: Multidetector CT imaging of the cervical spine was performed without intravenous contrast. Multiplanar CT image reconstructions were also generated. COMPARISON:  May 02, 2021. FINDINGS: Alignment: Mild grade 1 anterolisthesis of C3-4 is noted secondary to posterior facet joint hypertrophy. Minimal grade 1 retrolisthesis of C5-6 is noted secondary to moderate degenerative disc disease. Skull base and vertebrae: No acute fracture. No primary bone lesion or focal pathologic process. Soft tissues and spinal canal: No prevertebral fluid or swelling. No visible canal hematoma. Disc levels: Severe degenerative disc disease is noted at C3-4. Moderate degenerative disc disease is noted at C5-6 and C6-7. Upper chest: Negative. Other: Degenerative changes are seen involving the posterior facet joints bilaterally. IMPRESSION: Multilevel degenerative disc disease. No acute abnormality seen in the cervical spine. Electronically Signed   By: Lupita Raider M.D.   On: 05/19/2021 16:41  DG Chest  Portable 1 View  Result Date: 05/19/2021 CLINICAL DATA:  Weakness. EXAM: PORTABLE CHEST 1 VIEW COMPARISON:  May 08, 2016. FINDINGS: The heart size and mediastinal contours are within normal limits. No pneumothorax or pleural effusion is noted. Stable left lingular subsegmental atelectasis or scarring is noted. Right lung is clear. The visualized skeletal structures are unremarkable. IMPRESSION: Stable left lingular subsegmental atelectasis or scarring. Electronically Signed   By: Lupita Raider M.D.   On: 05/19/2021 16:00    Procedures Procedures   Medications Ordered in ED Medications  sodium chloride 0.9 % bolus 1,000 mL (0 mLs Intravenous Stopped 05/19/21 1736)    Followed by  0.9 %  sodium chloride infusion (0 mLs Intravenous Stopped 05/19/21 1934)  potassium chloride 10 mEq in 100 mL IVPB (10 mEq Intravenous Not Given 05/19/21 1945)    ED Course  I have reviewed the triage vital signs and the nursing notes.  Pertinent labs & imaging results that were available during my care of the patient were reviewed by me and considered in my medical decision making (see chart for details).  Clinical Course as of 05/19/21 1955  Wed May 19, 2021  1634 Chest x-ray without acute findings [JK]  1635 Head CT without acute findings [JK]  1749 C-spine CT without acute findings [JK]  1936 Pt is now walking around the E [JK]  1953 Patient is now awake and alert.  She was able to walk to the bathroom.  She is adamant about leaving. [JK]    Clinical Course User Index [JK] Linwood Dibbles, MD   MDM Rules/Calculators/A&P                          Patient was brought to the emergency room after she was found lying in stool by her daughter.  Patient does have history of chronic benzodiazepine use.  She also has a history of alcohol use.  Patient was initially very somnolent.  Her ED work-up overall is reassuring however.  Head CT and C-spine CT did not show any acute findings.  Her laboratory test do not show  any signs of anemia or dehydration.  Patient does have hypokalemia but this would not account for her mental status change earlier.  Patient is much more awake and alert now.  She is adamant about leaving.  She has been able to walk.  I suspect that the patient's symptoms could be related to her medications.  She does get prescribed benzodiazepines.  She was given 150, 1 mg alprazolam tablets on May 20.  Patient is here with her daughter.  She denies take any extra medications.  I suggested the daughter might want to check the pill bottle to make sure.  I recommended further evaluation in the ED but the patient is adamant about leaving.  Daughter understands and will take her home.  Have stressed importance of following up with her primary care doctor to be rechecked and to use caution with her medications. Final Clinical Impression(s) / ED Diagnoses Final diagnoses:  Altered mental status, unspecified altered mental status type  Hypokalemia    Rx / DC Orders ED Discharge Orders         Ordered    potassium chloride (KLOR-CON) 10 MEQ tablet  2 times daily        05/19/21 1952           Linwood Dibbles, MD 05/19/21 [phone]

## 2021-05-19 NOTE — ED Notes (Signed)
Pt dressed in disposable scrubs. IV removed per pt request . Pt wanting to leave. Pt refuses to use her cane at home and instead uses furniture.

## 2022-03-26 ENCOUNTER — Emergency Department (HOSPITAL_BASED_OUTPATIENT_CLINIC_OR_DEPARTMENT_OTHER): Payer: Medicare HMO

## 2022-03-26 ENCOUNTER — Other Ambulatory Visit: Payer: Self-pay

## 2022-03-26 ENCOUNTER — Emergency Department (HOSPITAL_BASED_OUTPATIENT_CLINIC_OR_DEPARTMENT_OTHER)
Admission: EM | Admit: 2022-03-26 | Discharge: 2022-03-26 | Disposition: A | Discharge status: Home / Self Care | Payer: Medicare HMO | Attending: Emergency Medicine | Admitting: Emergency Medicine

## 2022-03-26 ENCOUNTER — Encounter (HOSPITAL_BASED_OUTPATIENT_CLINIC_OR_DEPARTMENT_OTHER): Payer: Self-pay | Admitting: *Deleted

## 2022-03-26 DIAGNOSIS — W19XXXA Unspecified fall, initial encounter: Secondary | ICD-10-CM | POA: Diagnosis not present

## 2022-03-26 DIAGNOSIS — S59911A Unspecified injury of right forearm, initial encounter: Secondary | ICD-10-CM | POA: Diagnosis present

## 2022-03-26 DIAGNOSIS — S52501A Unspecified fracture of the lower end of right radius, initial encounter for closed fracture: Secondary | ICD-10-CM | POA: Insufficient documentation

## 2022-03-26 DIAGNOSIS — R519 Headache, unspecified: Secondary | ICD-10-CM | POA: Insufficient documentation

## 2022-03-26 LAB — CBC WITH DIFFERENTIAL/PLATELET
Abs Immature Granulocytes: 0.02 10*3/uL (ref 0.00–0.07)
Basophils Absolute: 0.1 10*3/uL (ref 0.0–0.1)
Basophils Relative: 2 %
Eosinophils Absolute: 0.2 10*3/uL (ref 0.0–0.5)
Eosinophils Relative: 3 %
HCT: 40.3 % (ref 36.0–46.0)
Hemoglobin: 12.7 g/dL (ref 12.0–15.0)
Immature Granulocytes: 0 %
Lymphocytes Relative: 23 %
Lymphs Abs: 1.5 10*3/uL (ref 0.7–4.0)
MCH: 29.3 pg (ref 26.0–34.0)
MCHC: 31.5 g/dL (ref 30.0–36.0)
MCV: 93.1 fL (ref 80.0–100.0)
Monocytes Absolute: 0.3 10*3/uL (ref 0.1–1.0)
Monocytes Relative: 5 %
Neutro Abs: 4.3 10*3/uL (ref 1.7–7.7)
Neutrophils Relative %: 67 %
Platelets: 512 10*3/uL — ABNORMAL HIGH (ref 150–400)
RBC: 4.33 MIL/uL (ref 3.87–5.11)
RDW: 14 % (ref 11.5–15.5)
WBC: 6.4 10*3/uL (ref 4.0–10.5)
nRBC: 0 % (ref 0.0–0.2)

## 2022-03-26 LAB — URINALYSIS, ROUTINE W REFLEX MICROSCOPIC
Cellular Cast, UA: 1
Glucose, UA: NEGATIVE mg/dL
Hgb urine dipstick: NEGATIVE
Ketones, ur: >80 mg/dL — AB
Nitrite: NEGATIVE
Protein, ur: 30 mg/dL — AB
Specific Gravity, Urine: 1.021 (ref 1.005–1.030)
pH: 5.5 (ref 5.0–8.0)

## 2022-03-26 LAB — BASIC METABOLIC PANEL
Anion gap: 26 — ABNORMAL HIGH (ref 5–15)
BUN: 15 mg/dL (ref 8–23)
CO2: 13 mmol/L — ABNORMAL LOW (ref 22–32)
Calcium: 10.4 mg/dL — ABNORMAL HIGH (ref 8.9–10.3)
Chloride: 99 mmol/L (ref 98–111)
Creatinine, Ser: 0.97 mg/dL (ref 0.44–1.00)
GFR, Estimated: >60 mL/min (ref >60)
Glucose, Bld: 54 mg/dL — ABNORMAL LOW (ref 70–99)
Potassium: 4.3 mmol/L (ref 3.5–5.1)
Sodium: 138 mmol/L (ref 135–145)

## 2022-03-26 LAB — MAGNESIUM: Magnesium: 1.9 mg/dL (ref 1.7–2.4)

## 2022-03-26 MED ORDER — OXYCODONE HCL ER 20 MG PO T12A: 20.0000 mg | EXTENDED_RELEASE_TABLET | Freq: Two times a day (BID) | ORAL | Status: DC

## 2022-03-26 MED ORDER — OXYCODONE HCL ER 20 MG PO T12A: 20.0000 mg | EXTENDED_RELEASE_TABLET | Freq: Once | ORAL | Status: DC

## 2022-03-26 MED ORDER — MORPHINE SULFATE (PF) 4 MG/ML IV SOLN
4.0000 mg | Freq: Once | INTRAVENOUS | Status: AC
  Administered 2022-03-26: 4 mg via INTRAVENOUS
  Filled 2022-03-26: qty 1

## 2022-03-26 MED ORDER — LIDOCAINE HCL (PF) 1 % IJ SOLN
10.0000 mL | Freq: Once | INTRAMUSCULAR | Status: AC
  Administered 2022-03-26: 10 mL
  Filled 2022-03-26: qty 10

## 2022-03-26 MED ORDER — MECLIZINE HCL 25 MG PO TABS
25.0000 mg | ORAL_TABLET | Freq: Once | ORAL | Status: AC
  Administered 2022-03-26: 25 mg via ORAL
  Filled 2022-03-26: qty 1

## 2022-03-26 MED ORDER — ONDANSETRON HCL 4 MG/2ML IJ SOLN
4.0000 mg | Freq: Once | INTRAMUSCULAR | Status: AC
  Administered 2022-03-26: 4 mg via INTRAVENOUS
  Filled 2022-03-26: qty 2

## 2022-03-26 NOTE — ED Provider Notes (Signed)
?[facility] EMERGENCY DEPT ?Provider Note ? ? ?CSN: [id number] ?Arrival date & time: 03/26/22  1728 ? ?  ? ?History ? ?Chief Complaint  ?Patient presents with  ? Fall  ? ? ?MALEE GRAYS is a 66 y.o. female. ? ?Patient presents ER complaint of falls.  She is fallen several times in the last 10 days.  She uses a cane and a walker at times but intermittently.  Complaining of right forearm pain and headache.  Denies any vomiting or diarrhea.  There is have a history of chronic alcohol use.  Daughter states that they have tried to call social services and try to have the patient go to an assisted living facility but has been refusing additional health thus far. ? ? ?  ? ?Home Medications ?Prior to Admission medications   ?Medication Sig Start Date End Date Taking? Authorizing Provider  ?ALPRAZolam (XANAX) 1 MG tablet Take 1-2 mg by mouth 4 (four) times daily as needed for anxiety or sleep.  4 times daily as needed for anxiety and an additional  at bedtime for sleep.    [provider]  ?Ascorbic Acid (VITAMIN C PO) Take 1 capsule by mouth daily.    [provider]  ?b complex vitamins tablet Take 5 tablets by mouth daily. ?Patient not taking: Reported on 05/19/2021    [provider]  ?butalbital-acetaminophen-caffeine (FIORICET, ESGIC) 50-325-40 MG tablet Take 2 tablets by mouth every 4 (four) hours as needed for migraine.  05/04/16   [provider]  ?Calcium Carb-Cholecalciferol (CALCIUM + D3 PO) Take 1 capsule by mouth daily.    [provider]  ?cetirizine (ZYRTEC) 10 MG tablet Take 10 mg by mouth daily.    [provider]  ?dicyclomine (BENTYL) 20 MG tablet Take 20 mg by mouth 4 (four) times daily as needed. 04/15/21   [provider]  ?metoCLOPramide (REGLAN) 10 MG tablet Take 10 mg by mouth every 6 (six) hours as needed for nausea or vomiting.    [provider]  ?Multiple Vitamin (MULTIVITAMIN) tablet Take 1 tablet by mouth  daily. ?Patient not taking: Reported on 05/19/2021    [provider]  ?nortriptyline (PAMELOR) 10 MG capsule TAKE 1 CAPSULE BY MOUTH EVERYDAY AT BEDTIME ?Patient not taking: Reported on 05/19/2021 04/10/21   [provider]  ?oxyCODONE (OXYCONTIN) 20 mg 12 hr tablet Take 20 mg by mouth every 12 (twelve) hours as needed (pain).    [provider]  ?oxymetazoline (AFRIN) 0.05 % nasal spray Place 2 sprays into both nostrils daily as needed for congestion.    [provider]  ?polyvinyl alcohol (LIQUIFILM TEARS) 1.4 % ophthalmic solution Place 1-2 drops into both eyes as needed for dry eyes.    [provider]  ?potassium chloride (KLOR-CON) 10 MEQ tablet Take 1 tablet (10 mEq total) by mouth 2 (two) times daily for 7 days. 05/19/21 05/26/21  Linwood Dibbles, MD  ?Probiotic Product (PROBIOTIC-10 PO) Take 1 tablet by mouth daily.    [provider]  ?tizanidine (ZANAFLEX) 2 MG capsule Take 2.5 mg by mouth 3 (three) times daily. ?Patient not taking: Reported on 05/19/2021    [provider]  ?tizanidine (ZANAFLEX) 6 MG capsule Take 6 mg by mouth 3 (three) times daily. 05/14/21   [provider]  ?zaleplon (SONATA) 10 MG capsule 1 capsule at bedtime. ?Patient not taking: Reported on 05/19/2021 06/30/16   [provider]  ?   ? ?Allergies    ?Amoxicillin-pot clavulanate,  Azithromycin, Bupropion, Nsaids, Prednisone, and Vilazodone   ? ?Review of Systems   ?Review of Systems  ?Constitutional:  Negative for fever.  ?HENT:  Negative for ear pain.   ?Eyes:  Negative for pain.  ?Respiratory:  Negative for cough.   ?Cardiovascular:  Negative for chest pain.  ?Gastrointestinal:  Negative for abdominal pain.  ?Genitourinary:  Negative for flank pain.  ?Musculoskeletal:  Negative for back pain.  ?Skin:  Negative for rash.  ?Neurological:  Negative for headaches.  ? ?Physical Exam ?Updated Vital Signs ?BP (!) 162/93 (BP Location: Right Arm)   Pulse 77   Temp 98.7 ?F  (37.1 ?C) (Oral)   Resp 16   Wt 54.4 kg   SpO2 100%   BMI 19.97 kg/m?  ?Physical Exam ?Constitutional:   ?   General: She is not in acute distress. ?   Appearance: Normal appearance.  ?HENT:  ?   Head: Normocephalic.  ?   Nose: Nose normal.  ?Eyes:  ?   Extraocular Movements: Extraocular movements intact.  ?Cardiovascular:  ?   Rate and Rhythm: Normal rate.  ?Pulmonary:  ?   Effort: Pulmonary effort is normal.  ?Musculoskeletal:  ?   Cervical back: Normal range of motion.  ?   Comments: Deformity noted of the right wrist.  Otherwise ranging bilateral hips and ankles and knees without significant pain no deformity in the lower extremities noted.  ?Neurological:  ?   General: No focal deficit present.  ?   Mental Status: She is alert and oriented to person, place, and time. Mental status is at baseline.  ?   Cranial Nerves: No cranial nerve deficit.  ?   Motor: No weakness.  ? ? ?ED Results / Procedures / Treatments   ?Labs ?(all labs ordered are listed, but only abnormal results are displayed) ?Labs Reviewed  ?CBC WITH DIFFERENTIAL/PLATELET - Abnormal; Notable for the following components:  ?    Result Value  ? Platelets 512 (*)   ? All other components within normal limits  ?BASIC METABOLIC PANEL - Abnormal; Notable for the following components:  ? CO2 13 (*)   ? Glucose, Bld 54 (*)   ? Calcium 10.4 (*)   ? Anion gap 26 (*)   ? All other components within normal limits  ?URINALYSIS, ROUTINE W REFLEX MICROSCOPIC - Abnormal; Notable for the following components:  ? Bilirubin Urine SMALL (*)   ? Ketones, ur >80 (*)   ? Protein, ur 30 (*)   ? Leukocytes,Ua TRACE (*)   ? All other components within normal limits  ?MAGNESIUM  ? ? ?EKG ?None ? ?Radiology ?DG Forearm Right ? ?Result Date: 03/26/2022 ?CLINICAL DATA:  Pain after a fall last Thursday. EXAM: RIGHT FOREARM - 2 VIEW COMPARISON:  05/02/2021 FINDINGS: Acute comminuted fractures of the distal right radius with extension to the articular surface. Fractures involve  the radiocarpal and radioulnar joints. Dorsal angulation and impaction of the distal fracture fragments. Mildly displaced fracture of the ulnar styloid process. Calcification in the triangular fibrocartilage. Degenerative changes in the carpus. Diffuse soft tissue swelling about the wrist. IMPRESSION: Acute fractures of the distal right radius and ulna with dorsal angulation of the distal fracture fragments. Soft tissue swelling. Electronically Signed   By: Burman Nieves M.D.   On: 03/26/2022 19:07  ? ?DG Wrist 2 Views Right ? ?Result Date: 03/26/2022 ?CLINICAL DATA:  Post reduction. EXAM: RIGHT WRIST - 2 VIEW COMPARISON:  Right forearm x-ray 03/26/2022. FINDINGS: New overlying cast obscures fine  bony detail. Distal radius fracture is in anatomic alignment. No dislocation. There is soft tissue swelling surrounding the wrist. IMPRESSION: 1. Distal radius fracture is in anatomic alignment. Electronically Signed   By: Darliss CheneyAmy  Guttmann M.D.   On: 03/26/2022 21:12  ? ?DG Ankle Complete Right ? ?Result Date: 03/26/2022 ?CLINICAL DATA:  Fall EXAM: RIGHT ANKLE - COMPLETE 3+ VIEW COMPARISON:  None. FINDINGS: No acute bony abnormality. Specifically, no fracture, subluxation, or dislocation. Soft tissues unremarkable. IMPRESSION: No acute bony abnormality. Electronically Signed   By: Charlett NoseKevin  Dover M.D.   On: 03/26/2022 19:17  ? ?CT Head Wo Contrast ? ?Result Date: 03/26/2022 ?CLINICAL DATA:  Unsteady gait. Frequent falls over the last 1.5 weeks. Injuries last Thursday. EXAM: CT HEAD WITHOUT CONTRAST TECHNIQUE: Contiguous axial images were obtained from the base of the skull through the vertex without intravenous contrast. RADIATION DOSE REDUCTION: This exam was performed according to the departmental dose-optimization program which includes automated exposure control, adjustment of the mA and/or kV according to patient size and/or use of iterative reconstruction technique. COMPARISON:  05/19/2021 FINDINGS: Brain: Diffuse cerebral  atrophy. Ventricular dilatation consistent with central atrophy. Low-attenuation changes in the deep white matter consistent with small vessel ischemia. No abnormal extra-axial fluid collections. No mass effect o

## 2022-03-26 NOTE — ED Triage Notes (Addendum)
Pt has been having multiple falls since last Thursday (9 days ago).  Pt states that she has had vertigo causing her to loose her balance. Pt has been eating and drinking very little. ?

## 2022-03-26 NOTE — ED Notes (Signed)
Patient transported to X-ray 

## 2022-03-26 NOTE — ED Notes (Signed)
Patient transported to CT 

## 2022-03-26 NOTE — Discharge Instructions (Signed)
Follow-up with orthopedics by calling the number provided on Monday morning. ? ?You also need to follow-up with your primary care doctor this week.  You may need additional help and resources due to your frequency of falls. ?

## 2022-04-06 ENCOUNTER — Emergency Department (HOSPITAL_COMMUNITY)
Admission: EM | Admit: 2022-04-06 | Discharge: 2022-04-11 | Disposition: A | Discharge status: Home / Self Care | Payer: Medicare HMO | Attending: Emergency Medicine | Admitting: Emergency Medicine

## 2022-04-06 ENCOUNTER — Emergency Department (HOSPITAL_COMMUNITY): Payer: Medicare HMO

## 2022-04-06 ENCOUNTER — Encounter (HOSPITAL_COMMUNITY): Payer: Self-pay | Admitting: Emergency Medicine

## 2022-04-06 ENCOUNTER — Other Ambulatory Visit: Payer: Self-pay

## 2022-04-06 DIAGNOSIS — R531 Weakness: Secondary | ICD-10-CM | POA: Insufficient documentation

## 2022-04-06 DIAGNOSIS — F1120 Opioid dependence, uncomplicated: Secondary | ICD-10-CM | POA: Diagnosis present

## 2022-04-06 DIAGNOSIS — Z79899 Other long term (current) drug therapy: Secondary | ICD-10-CM | POA: Insufficient documentation

## 2022-04-06 DIAGNOSIS — F19180 Other psychoactive substance abuse with psychoactive substance-induced anxiety disorder: Secondary | ICD-10-CM | POA: Insufficient documentation

## 2022-04-06 DIAGNOSIS — R296 Repeated falls: Secondary | ICD-10-CM | POA: Insufficient documentation

## 2022-04-06 DIAGNOSIS — F1010 Alcohol abuse, uncomplicated: Secondary | ICD-10-CM

## 2022-04-06 DIAGNOSIS — M6281 Muscle weakness (generalized): Secondary | ICD-10-CM | POA: Diagnosis not present

## 2022-04-06 DIAGNOSIS — E876 Hypokalemia: Secondary | ICD-10-CM | POA: Insufficient documentation

## 2022-04-06 DIAGNOSIS — F0390 Unspecified dementia without behavioral disturbance: Secondary | ICD-10-CM | POA: Insufficient documentation

## 2022-04-06 DIAGNOSIS — F10120 Alcohol abuse with intoxication, uncomplicated: Secondary | ICD-10-CM | POA: Insufficient documentation

## 2022-04-06 DIAGNOSIS — F10920 Alcohol use, unspecified with intoxication, uncomplicated: Secondary | ICD-10-CM | POA: Insufficient documentation

## 2022-04-06 DIAGNOSIS — F1320 Sedative, hypnotic or anxiolytic dependence, uncomplicated: Secondary | ICD-10-CM | POA: Diagnosis present

## 2022-04-06 LAB — URINALYSIS, ROUTINE W REFLEX MICROSCOPIC
Bacteria, UA: NONE SEEN
Glucose, UA: NEGATIVE mg/dL
Hgb urine dipstick: NEGATIVE
Ketones, ur: 20 mg/dL — AB
Leukocytes,Ua: NEGATIVE
Nitrite: NEGATIVE
Protein, ur: 100 mg/dL — AB
Specific Gravity, Urine: 1.03 (ref 1.005–1.030)
pH: 5 (ref 5.0–8.0)

## 2022-04-06 LAB — COMPREHENSIVE METABOLIC PANEL
ALT: 18 U/L (ref 0–44)
AST: 55 U/L — ABNORMAL HIGH (ref 15–41)
Albumin: 3.3 g/dL — ABNORMAL LOW (ref 3.5–5.0)
Alkaline Phosphatase: 427 U/L — ABNORMAL HIGH (ref 38–126)
Anion gap: 11 (ref 5–15)
BUN: 15 mg/dL (ref 8–23)
CO2: 26 mmol/L (ref 22–32)
Calcium: 9.4 mg/dL (ref 8.9–10.3)
Chloride: 94 mmol/L — ABNORMAL LOW (ref 98–111)
Creatinine, Ser: 0.68 mg/dL (ref 0.44–1.00)
GFR, Estimated: >60 mL/min (ref >60)
Glucose, Bld: 93 mg/dL (ref 70–99)
Potassium: 2.5 mmol/L — CL (ref 3.5–5.1)
Sodium: 131 mmol/L — ABNORMAL LOW (ref 135–145)
Total Bilirubin: 1 mg/dL (ref 0.3–1.2)
Total Protein: 6.5 g/dL (ref 6.5–8.1)

## 2022-04-06 LAB — CBC
HCT: 41.7 % (ref 36.0–46.0)
Hemoglobin: 13.9 g/dL (ref 12.0–15.0)
MCH: 29.9 pg (ref 26.0–34.0)
MCHC: 33.3 g/dL (ref 30.0–36.0)
MCV: 89.7 fL (ref 80.0–100.0)
Platelets: 382 10*3/uL (ref 150–400)
RBC: 4.65 MIL/uL (ref 3.87–5.11)
RDW: 13.2 % (ref 11.5–15.5)
WBC: 8.4 10*3/uL (ref 4.0–10.5)
nRBC: 0 % (ref 0.0–0.2)

## 2022-04-06 LAB — CK: Total CK: 19 U/L — ABNORMAL LOW (ref 38–234)

## 2022-04-06 LAB — RAPID URINE DRUG SCREEN, HOSP PERFORMED
Amphetamines: POSITIVE — AB
Barbiturates: NOT DETECTED
Benzodiazepines: NOT DETECTED
Cocaine: NOT DETECTED
Opiates: NOT DETECTED
Tetrahydrocannabinol: NOT DETECTED

## 2022-04-06 LAB — MAGNESIUM: Magnesium: 1.8 mg/dL (ref 1.7–2.4)

## 2022-04-06 MED ORDER — ALUM & MAG HYDROXIDE-SIMETH 200-200-20 MG/5ML PO SUSP
30.0000 mL | Freq: Once | ORAL | Status: AC
Start: 2022-04-06 — End: 2022-04-07
  Administered 2022-04-07: 30 mL via ORAL
  Filled 2022-04-06: qty 30

## 2022-04-06 MED ORDER — SODIUM CHLORIDE 0.9 % IV BOLUS
1000.0000 mL | Freq: Once | INTRAVENOUS | Status: AC
  Administered 2022-04-06: 1000 mL via INTRAVENOUS

## 2022-04-06 MED ORDER — POTASSIUM CHLORIDE 10 MEQ/100ML IV SOLN
10.0000 meq | INTRAVENOUS | Status: AC
  Administered 2022-04-06 – 2022-04-07 (×4): 10 meq via INTRAVENOUS
  Filled 2022-04-06 (×3): qty 100

## 2022-04-06 MED ORDER — POTASSIUM CHLORIDE CRYS ER 10 MEQ PO TBCR
10.0000 meq | EXTENDED_RELEASE_TABLET | Freq: Two times a day (BID) | ORAL | Status: DC
  Administered 2022-04-06: 10 meq via ORAL
  Filled 2022-04-06: qty 1

## 2022-04-06 MED ORDER — TRAZODONE HCL 50 MG PO TABS
100.0000 mg | ORAL_TABLET | Freq: Every day | ORAL | Status: DC
  Administered 2022-04-06 – 2022-04-10 (×5): 100 mg via ORAL
  Filled 2022-04-06 (×5): qty 2

## 2022-04-06 MED ORDER — ACETAMINOPHEN 325 MG PO TABS
650.0000 mg | ORAL_TABLET | Freq: Four times a day (QID) | ORAL | Status: DC | PRN
  Administered 2022-04-08 – 2022-04-09 (×2): 650 mg via ORAL
  Filled 2022-04-06 (×3): qty 2

## 2022-04-06 MED ORDER — POTASSIUM CHLORIDE 10 MEQ/100ML IV SOLN
10.0000 meq | Freq: Once | INTRAVENOUS | Status: AC
  Administered 2022-04-06: 10 meq via INTRAVENOUS
  Filled 2022-04-06: qty 100

## 2022-04-06 MED ORDER — OXYCODONE HCL ER 10 MG PO T12A
10.0000 mg | EXTENDED_RELEASE_TABLET | Freq: Four times a day (QID) | ORAL | Status: DC | PRN
  Administered 2022-04-06 – 2022-04-08 (×6): 10 mg via ORAL
  Filled 2022-04-06 (×6): qty 1

## 2022-04-06 MED ORDER — PANTOPRAZOLE SODIUM 40 MG PO TBEC
40.0000 mg | DELAYED_RELEASE_TABLET | Freq: Every day | ORAL | Status: DC
Start: 2022-04-06 — End: 2022-04-12
  Administered 2022-04-06 – 2022-04-11 (×6): 40 mg via ORAL
  Filled 2022-04-06 (×6): qty 1

## 2022-04-06 MED ORDER — POTASSIUM CHLORIDE CRYS ER 20 MEQ PO TBCR
40.0000 meq | EXTENDED_RELEASE_TABLET | ORAL | Status: AC
  Administered 2022-04-06 (×2): 40 meq via ORAL
  Filled 2022-04-06 (×2): qty 2

## 2022-04-06 MED ORDER — OXYCODONE-ACETAMINOPHEN 5-325 MG PO TABS
1.0000 | ORAL_TABLET | Freq: Once | ORAL | Status: AC
  Administered 2022-04-06: 1 via ORAL
  Filled 2022-04-06: qty 1

## 2022-04-06 MED ORDER — POTASSIUM CHLORIDE IN NACL 40-0.9 MEQ/L-% IV SOLN
INTRAVENOUS | Status: AC
  Filled 2022-04-06: qty 1000

## 2022-04-06 MED ORDER — TRAZODONE HCL 50 MG PO TABS: 50.0000 mg | ORAL_TABLET | Freq: Every day | ORAL | Status: DC

## 2022-04-06 NOTE — ED Triage Notes (Signed)
IVC'd by daughter. Cathren Harsh [phone].  ? ?

## 2022-04-06 NOTE — Consult Note (Signed)
?                                                                                           ? ? ? ? Patient Demographics:  ? ? ?Jamie [name], is a 66 y.o. female  MRN: [id number]   DOB - 04-Nov-1956 ? ?Admit Date - 04/06/2022 ? ?Outpatient Primary MD for the patient is Jamie Sacramento, MD ? ? Assessment & Plan:  ? ?Assessment and Plan: ? ?1)Recurrent Falls/Generalized weakness--- patient has been reluctant to accept therapy including ALF placement ?-Daughter worried about getting pt back home from ED due to steps into the House ?-Patient ambulated with assist of 1 in the ED here today ?-Await official PT/OT eval ?-May need to go home with home health versus placement to ALF or SNF ?--Her house is clustered with excrement , furniture and other objects around the house making it difficult for her to get around  with a walker ?-CK is not elevated ?--CT head without acute findings ?-Chest x-ray without acute findings ?-Right forearm x-ray with unchanged recent distal radius fracture no changes from x-ray on 03/26/2022 ?-X-rays of the knee and ankle from 03/26/2022 without acute findings ? ?2)Alcoholic hepatitis--- -AST 55 ALT 18 albumin is 3.3 alk phos is 427 and T. bili is 1.0 ?AST to ALT ratio is consistent with alcoholic hepatitis ?-Unfortunately patient has been a long-term drinker ?-She is Not interested in rehab ? ?3)Polysubstance abuse/anxiety disorder /alcohol abuse ---  ?--Denies suicidal or homicidal ideation or plan ?-Patient admits that she has been making "bad choices" ?-UDS with amphetamine patient denies amphetamine use (UDS negative for benzos and opiates --patient admits that she ran out of on benzos and opiates because he used more than prescribed ?-Patient is not interested in drug rehab programs or alcohol rehab ? ?4) hypokalemia/Hyponatremia/Hypochloremia---Potassium is 2.5 sodium is 131, chloride 94--denies vomiting or diarrhea  however admits to poor oral intake ?-BUN/creatinine 15 and 0.68, bicarb is 26 and anion gap is 11 ?-Replace electrolytes check serum magnesium ? ?5) social/ethics-Patient presents today under IVC because she refused to come to the ED for evaluation.  Daughter called Education officer, museum and got IVC- ?-In ED patient is coherent, appropriate , AAO x 4, cooperative and polite ?- ?--Denies suicidal or homicidal ideation or plan ?-Patient admits that she has been making "bad choices" ?--- Frankly IVC can be discontinued-patient appears not to meet further criteria for IVC ?-Defer to EDP ? ?6)Distal right radius fracture----Right forearm x-ray with unchanged recent distal radius fracture no changes from x-ray on 03/26/2022 ?-Keep splint in place and follow-up with orthopedics as outpatient ? ?Disposition--- possible discharge home from ED versus placement to ALF/SNF pending PT OT eval ?-Daughter worried about getting pt back home from ED due to steps into the House ? ?Dispo: The patient is from: Home ?           ? ? ?With History of - ?Reviewed by me ? ?Past Medical History:  ?Diagnosis Date  ? Anxiety   ? Arthritis   ? Chronic pain   ? Gastroenteritis   ? Headache   ? Hypotension   ?  Insomnia   ? Suicidal ideation   ?   ? ?Past Surgical History:  ?Procedure Laterality Date  ? BACK SURGERY    ? CARPAL TUNNEL RELEASE    ? LAPAROSCOPIC OOPHERECTOMY    ? MOUTH SURGERY    ? ? ?Chief Complaint  ?Patient presents with  ? Medical Clearance  ?  ? ? HPI:  ? ? Jamie [name]  is a 66 y.o. female with past medical history relevant for arthritis, migraine headaches, chronic pain syndrome with prior back surgery, chronically on opiates, anxiety and insomnia chronically on benzodiazepine, alcohol abuse history of noncompliance presents to the ED brought in by daughter [name] on IVC ?-Patient was evaluated at [facility] on 03/26/2022 and found to have distal radius fracture of the right upper extremity as well as ankle  sprain- ?-As per daughter patient has been falling at home, concerns for ongoing alcohol use, patient used up to 1 month supply of opiates and benzos in less than 2 weeks--- patient admits to taking excessive amount of medications and running out prior to refill date ?- ?-Additional history obtained from patient's daughter [name] at bedside ?-Unfortunately patient's pattern of behavior is not new--- on November 22, 2016 patient's daughter got patient admitted to behavioral health unit on IVC status due to patient taking more medications than prescribed and threatening possible suicidal ideation at the time ?-On May 02, 2021 patient was evaluated in the ED due to recurrent falls not taking care of herself--- at that time patient refused further help including placement of ALF ?-Patient was again evaluated on May 19, 2021--she again refused further help ?-Patient's daughter was able to get social worker involved--however pt continues to refuse Help and Refused placement to ALF ?-Patient presents today under IVC because she refused to come to the ED for evaluation.  Daughter called Education officer, museum and got IVC- ?-In ED patient is coherent, appropriate , AAO x 4, cooperative and polite ?-Denies suicidal or homicidal ideation or plan ?-Patient admits that she has been making "bad choices" ?-Her house is clustered with excrement , furniture and other objects around the house making it difficult for her to get around  with a walker ?- ?In the ED patient ambulated with assist of 1 ?-UA not suggestive of UTI ?-CT head without acute findings ?-Chest x-ray without acute findings ?-Right forearm x-ray with unchanged recent distal radius fracture no changes from x-ray on 03/26/2022 ?-CBC WNL ?-CK is not elevated ?-UDS with amphetamine patient denies amphetamine use (UDS negative for benzos and opiates --patient admits that she ran out of on benzos and opiates because he used more than prescribed ?-Potassium is 2.5 sodium  is 131, chloride 94--denies vomiting or diarrhea however admits to poor oral intake ?-BUN/creatinine 15 and 0.68, bicarb is 26 and anion gap is 11 ?-AST 55 ALT 18 albumin is 3.3 alk phos is 427 and T. bili is 1.0 ? ? Review of systems:  ?  ?In addition to the HPI above,  ? ?A full Review of  Systems was done, all other systems reviewed are negative except as noted above in HPI , . ? ? ? Social History:  ?Reviewed by me ? ?  ?Social History  ? ?Tobacco Use  ? Smoking status: Never  ? Smokeless tobacco: Never  ?Substance Use Topics  ? Alcohol use: Not Currently  ? ? ? Family History :  ?Reviewed by me ?  ?Family History  ?Problem Relation Age of Onset  ?  Colon cancer Neg Hx   ? ? Home Medications:  ? ?Prior to Admission medications   ?Medication Sig Start Date End Date Taking? Authorizing Provider  ?ALPRAZolam (XANAX) 1 MG tablet Take 1-2 mg by mouth 4 (four) times daily as needed for anxiety or sleep. $RemoveB'1mg'WEvGNUdL$  4 times daily as needed for anxiety and an additional $RemoveBefor'2mg'gDWbphEAfJbn$  at bedtime for sleep.    [provider]  ?Ascorbic Acid (VITAMIN C PO) Take 1 capsule by mouth daily.    [provider]  ?b complex vitamins tablet Take 5 tablets by mouth daily. ?Patient not taking: Reported on 05/19/2021    [provider]  ?butalbital-acetaminophen-caffeine (FIORICET, ESGIC) 50-325-40 MG tablet Take 2 tablets by mouth every 4 (four) hours as needed for migraine.  05/04/16   [provider]  ?Calcium Carb-Cholecalciferol (CALCIUM + D3 PO) Take 1 capsule by mouth daily.    [provider]  ?cetirizine (ZYRTEC) 10 MG tablet Take 10 mg by mouth daily.    [provider]  ?dicyclomine (BENTYL) 20 MG tablet Take 20 mg by mouth 4 (four) times daily as needed. 04/15/21   [provider]  ?metoCLOPramide (REGLAN) 10 MG tablet Take 10 mg by mouth every 6 (six) hours as needed for nausea or vomiting.    [provider]  ?Multiple Vitamin (MULTIVITAMIN) tablet Take 1 tablet by  mouth daily. ?Patient not taking: Reported on 05/19/2021    [provider]  ?nortriptyline (PAMELOR) 10 MG capsule TAKE 1 CAPSULE BY MOUTH EVERYDAY AT BEDTIME ?Patient not taking: Reported on 05/19/2021 4/16/2

## 2022-04-06 NOTE — ED Notes (Signed)
Patient given 2 pillows and 2 warm blankets at this time.  ?

## 2022-04-06 NOTE — ED Notes (Signed)
Patient transported to CT 

## 2022-04-06 NOTE — ED Notes (Signed)
Per IVC paperwork, patient has recently been falling a lot and hurt her wrist last week and patient has not been able to stand on her own and has been refusing to eat. Patient incontinent of urine on arrival and daughter is concerned for patient safety due to patient living in a hoarder situation. Hx of anxiety & depression ?

## 2022-04-06 NOTE — ED Provider Notes (Signed)
?[facility] ?Provider Note ? ? ?CSN: [id number] ?Arrival date & time: 04/06/22  1439 ? ?  ? ?History ? ?Chief Complaint  ?Patient presents with  ? Medical Clearance  ? ? ?Jamie [name] is a 66 y.o. female. ? ?Patient presented to the emergency room under IVC by daughter because she has not been taking care of herself.  She is being falling at home and broke her wrist and sprained her ankle a number days ago and still has the cast on.  She has not followed up with orthopedics.  She was found living in her own feces.  Patient is oriented to person place and time ? ? ?Weakness ?Severity:  Moderate ?Onset quality:  Gradual ?Timing:  Constant ?Progression:  Waxing and waning ?Chronicity:  Chronic ?Context: alcohol use   ?Relieved by:  Nothing ?Worsened by:  Nothing ?Ineffective treatments:  None tried ?Associated symptoms: no abdominal pain, no chest pain, no cough, no diarrhea, no frequency, no headaches and no seizures   ? ?  ? ?Home Medications ?Prior to Admission medications   ?Medication Sig Start Date End Date Taking? Authorizing Provider  ?ALPRAZolam (XANAX) 1 MG tablet Take 1-2 mg by mouth 4 (four) times daily as needed for anxiety or sleep. 1mg  4 times daily as needed for anxiety and an additional 2mg  at bedtime for sleep.    [provider]  ?Ascorbic Acid (VITAMIN C PO) Take 1 capsule by mouth daily.    [provider]  ?b complex vitamins tablet Take 5 tablets by mouth daily. ?Patient not taking: Reported on 05/19/2021    [provider]  ?butalbital-acetaminophen-caffeine (FIORICET, ESGIC) 50-325-40 MG tablet Take 2 tablets by mouth every 4 (four) hours as needed for migraine.  05/04/16   [provider]  ?Calcium Carb-Cholecalciferol (CALCIUM + D3 PO) Take 1 capsule by mouth daily.    [provider]  ?cetirizine (ZYRTEC) 10 MG tablet Take 10 mg by mouth daily.    [provider]  ?dicyclomine (BENTYL) 20 MG tablet Take 20 mg by mouth 4  (four) times daily as needed. 04/15/21   [provider]  ?metoCLOPramide (REGLAN) 10 MG tablet Take 10 mg by mouth every 6 (six) hours as needed for nausea or vomiting.    [provider]  ?Multiple Vitamin (MULTIVITAMIN) tablet Take 1 tablet by mouth daily. ?Patient not taking: Reported on 05/19/2021    [provider]  ?nortriptyline (PAMELOR) 10 MG capsule TAKE 1 CAPSULE BY MOUTH EVERYDAY AT BEDTIME ?Patient not taking: Reported on 05/19/2021 04/10/21   [provider]  ?oxyCODONE (OXYCONTIN) 20 mg 12 hr tablet Take 20 mg by mouth every 12 (twelve) hours as needed (pain).    [provider]  ?oxymetazoline (AFRIN) 0.05 % nasal spray Place 2 sprays into both nostrils daily as needed for congestion.    [provider]  ?polyvinyl alcohol (LIQUIFILM TEARS) 1.4 % ophthalmic solution Place 1-2 drops into both eyes as needed for dry eyes.    [provider]  ?potassium chloride (KLOR-CON) 10 MEQ tablet Take 1 tablet (10 mEq total) by mouth 2 (two) times daily for 7 days. 05/19/21 05/26/21  Dorie Rank, MD  ?Probiotic Product (PROBIOTIC-10 PO) Take 1 tablet by mouth daily.    [provider]  ?tizanidine (ZANAFLEX) 2 MG capsule Take 2.5 mg by mouth 3 (three) times daily. ?Patient not taking: Reported on 05/19/2021    [provider]  ?tizanidine (ZANAFLEX) 6 MG capsule Take 6  mg by mouth 3 (three) times daily. 05/14/21   [provider]  ?zaleplon (SONATA) 10 MG capsule 1 capsule at bedtime. ?Patient not taking: Reported on 05/19/2021 06/30/16   [provider]  ?   ? ?Allergies    ?Amoxicillin-pot clavulanate, Azithromycin, Bupropion, Nsaids, Prednisone, and Vilazodone   ? ?Review of Systems   ?Review of Systems  ?Constitutional:  Negative for appetite change and fatigue.  ?HENT:  Negative for congestion, ear discharge and sinus pressure.   ?Eyes:  Negative for discharge.  ?Respiratory:  Negative for cough.   ?Cardiovascular:   Negative for chest pain.  ?Gastrointestinal:  Negative for abdominal pain and diarrhea.  ?Genitourinary:  Negative for frequency and hematuria.  ?Musculoskeletal:  Negative for back pain.  ?Skin:  Negative for rash.  ?Neurological:  Positive for weakness. Negative for seizures and headaches.  ?Psychiatric/Behavioral:  Negative for hallucinations.   ? ?Physical Exam ?Updated Vital Signs ?BP (!) 149/62   Pulse 79   Temp 98.7 ?F (37.1 ?C) (Oral)   Resp 20   Ht 5\' 5"  (1.651 m)   Wt 63.5 kg   SpO2 96%   BMI 23.30 kg/m?  ?Physical Exam ?Constitutional:   ?   Appearance: She is well-developed.  ?HENT:  ?   Head: Normocephalic.  ?   Nose: Nose normal.  ?Eyes:  ?   General: No scleral icterus. ?   Conjunctiva/sclera: Conjunctivae normal.  ?Neck:  ?   Thyroid: No thyromegaly.  ?Cardiovascular:  ?   Rate and Rhythm: Normal rate and regular rhythm.  ?   Heart sounds: No murmur heard. ?  No friction rub. No gallop.  ?Pulmonary:  ?   Breath sounds: No stridor. No wheezing or rales.  ?Chest:  ?   Chest wall: No tenderness.  ?Abdominal:  ?   General: There is no distension.  ?   Tenderness: There is no abdominal tenderness. There is no rebound.  ?Musculoskeletal:     ?   General: Normal range of motion.  ?   Cervical back: Neck supple.  ?   Comments: Mild unsteady while walking  ?Lymphadenopathy:  ?   Cervical: No cervical adenopathy.  ?Skin: ?   Findings: No erythema or rash.  ?Neurological:  ?   Mental Status: She is alert and oriented to person, place, and time.  ?   Motor: No abnormal muscle tone.  ?   Coordination: Coordination normal.  ?Psychiatric:     ?   Behavior: Behavior normal.  ? ? ?ED Results / Procedures / Treatments   ?Labs ?(all labs ordered are listed, but only abnormal results are displayed) ?Labs Reviewed  ?COMPREHENSIVE METABOLIC PANEL - Abnormal; Notable for the following components:  ?    Result Value  ? Sodium 131 (*)   ? Potassium 2.5 (*)   ? Chloride 94 (*)   ? Albumin 3.3 (*)   ? AST 55 (*)   ?  Alkaline Phosphatase 427 (*)   ? All other components within normal limits  ?RAPID URINE DRUG SCREEN, HOSP PERFORMED - Abnormal; Notable for the following components:  ? Amphetamines POSITIVE (*)   ? All other components within normal limits  ?URINALYSIS, ROUTINE W REFLEX MICROSCOPIC - Abnormal; Notable for the following components:  ? Color, Urine AMBER (*)   ? Bilirubin Urine SMALL (*)   ? Ketones, ur 20 (*)   ? Protein, ur 100 (*)   ? All other components within normal limits  ?CK - Abnormal; Notable  for the following components:  ? Total CK 19 (*)   ? All other components within normal limits  ?CBC  ?MAGNESIUM  ? ? ?EKG ?None ? ?Radiology ?DG Chest 1 View ? ?Result Date: 04/06/2022 ?CLINICAL DATA:  Failure to thrive. EXAM: CHEST  1 VIEW COMPARISON:  Chest x-ray dated May 19, 2021. FINDINGS: The heart size and mediastinal contours are within normal limits. Normal pulmonary vascularity. Unchanged mild scarring in both lower lobes. No focal consolidation, pleural effusion, or pneumothorax. No acute osseous abnormality. IMPRESSION: 1. No active disease. Electronically Signed   By: Titus Dubin M.D.   On: 04/06/2022 16:04  ? ?DG Forearm Right ? ?Result Date: 04/06/2022 ?CLINICAL DATA:  Recent fall. EXAM: RIGHT FOREARM - 2 VIEW COMPARISON:  Right wrist and forearm x-rays dated March 26, 2022. FINDINGS: Acute mildly impacted distal radial metaphyseal fracture with minimal dorsal angulation, similar to prior study. No new fracture. No dislocation. 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 The elbow is grossly unremarkable. Osteopenia. IMPRESSION: 1. Unchanged recent distal radius fracture. Electronically Signed   By: Titus Dubin M.D.   On: 04/06/2022 16:02  ? ?CT Head Wo Contrast ? ?Result Date: 04/06/2022 ?CLINICAL DATA:  Dizziness EXAM: CT HEAD WITHOUT CONTRAST TECHNIQUE: Contiguous axial images were obtained from the base of the skull through the vertex without intravenous  contrast. RADIATION DOSE REDUCTION: This exam was performed according to the departmental dose-optimization program which includes automated exposure control, adjustment of the mA and/or kV according to patient size and/or

## 2022-04-07 LAB — BASIC METABOLIC PANEL
Anion gap: 5 (ref 5–15)
BUN: 10 mg/dL (ref 8–23)
CO2: 24 mmol/L (ref 22–32)
Calcium: 9 mg/dL (ref 8.9–10.3)
Chloride: 105 mmol/L (ref 98–111)
Creatinine, Ser: 0.55 mg/dL (ref 0.44–1.00)
GFR, Estimated: >60 mL/min (ref >60)
Glucose, Bld: 94 mg/dL (ref 70–99)
Potassium: 5.1 mmol/L (ref 3.5–5.1)
Sodium: 134 mmol/L — ABNORMAL LOW (ref 135–145)

## 2022-04-07 MED ORDER — POTASSIUM CHLORIDE 10 MEQ/100ML IV SOLN
INTRAVENOUS | Status: AC
Start: 2022-04-07 — End: 2022-04-07
  Administered 2022-04-07: 10 meq via INTRAVENOUS
  Filled 2022-04-07: qty 100

## 2022-04-07 MED ORDER — BUTALBITAL-APAP-CAFFEINE 50-325-40 MG PO TABS
1.0000 | ORAL_TABLET | ORAL | Status: DC | PRN
  Administered 2022-04-07 (×2): 1 via ORAL
  Administered 2022-04-08 – 2022-04-09 (×5): 2 via ORAL
  Administered 2022-04-10: 1 via ORAL
  Administered 2022-04-10 (×2): 2 via ORAL
  Administered 2022-04-10: 1 via ORAL
  Administered 2022-04-11: 2 via ORAL
  Filled 2022-04-07: qty 2
  Filled 2022-04-07: qty 1
  Filled 2022-04-07 (×2): qty 2
  Filled 2022-04-07: qty 1
  Filled 2022-04-07: qty 2
  Filled 2022-04-07: qty 1
  Filled 2022-04-07 (×3): qty 2
  Filled 2022-04-07: qty 1
  Filled 2022-04-07: qty 2

## 2022-04-07 MED ORDER — BUTALBITAL-APAP-CAFFEINE 50-325-40 MG PO TABS
1.0000 | ORAL_TABLET | ORAL | Status: DC | PRN
  Administered 2022-04-07: 1 via ORAL
  Filled 2022-04-07: qty 1

## 2022-04-07 NOTE — Evaluation (Signed)
Physical Therapy Evaluation ?Patient Details ?Name: Jamie [name] ?MRN: [id number] ?DOB: [birth year] ?Today's Date: 04/07/2022 ? ?History of Present Illness ? Patient presents ER complaint of falls.  She is fallen several times in the last 10 days.  She uses a cane and a walker at times but intermittently.  Complaining of right forearm pain and headache.  Denies any vomiting or diarrhea.  There is have a history of chronic alcohol use.  Daughter states that they have tried to call social services and try to have the patient go to an assisted living facility but has been refusing additional health thus far. (per MD) ?  ?Clinical Impression ? Patient demonstrates slow labored movement for sitting up at bedside and unable to use LUE due to casted and increased pain with movement, able to stand at bedside gripping RW with left hand and limited to a few steps forward/backwards, side stepping due to c/o fatigue and generalized weakness.  Patient put back to bed with Mod assist to reposition.  Patient will benefit from continued skilled physical therapy in hospital and recommended venue below to increase strength, balance, endurance for safe ADLs and gait.  ?   ?   ? ?Recommendations for follow up therapy are one component of a multi-disciplinary discharge planning process, led by the attending physician.  Recommendations may be updated based on patient status, additional functional criteria and insurance authorization. ? ?Follow Up Recommendations Skilled nursing-short term rehab (<3 hours/day) ? ?  ?Assistance Recommended at Discharge Intermittent Supervision/Assistance  ?Patient can return home with the following ? A lot of help with walking and/or transfers;A lot of help with bathing/dressing/bathroom;Assistance with cooking/housework;Help with stairs or ramp for entrance ? ?  ?Equipment Recommendations None recommended by PT  ?Recommendations for Other Services ?    ?  ?Functional Status Assessment Patient has had a recent  decline in their functional status and demonstrates the ability to make significant improvements in function in a reasonable and predictable amount of time.  ? ?  ?Precautions / Restrictions Precautions ?Precautions: Fall ?Precaution Comments: R wrist to elbow splint ?Restrictions ?Weight Bearing Restrictions: No  ? ?  ? ?Mobility ? Bed Mobility ?Overal bed mobility: Needs Assistance ?Bed Mobility: Supine to Sit, Sit to Supine ?  ?  ?Supine to sit: Min assist ?Sit to supine: Min assist ?  ?General bed mobility comments: as per OT notes ?  ? ?Transfers ?Overall transfer level: Needs assistance ?Equipment used: Rolling walker (2 wheels) (RW used to side only with L UE.) ?Transfers: Sit to/from Stand, Bed to chair/wheelchair/BSC ?Sit to Stand: Mod assist ?  ?Step pivot transfers: Mod assist ?  ?  ?  ?General transfer comment: as per OT notes ?  ? ?Ambulation/Gait ?Ambulation/Gait assistance: Mod assist, Max assist ?Gait Distance (Feet): 8 Feet ?Assistive device: Rolling walker (2 wheels), 1 person hand held assist ?Gait Pattern/deviations: Decreased step length - right, Decreased step length - left, Decreased stride length ?Gait velocity: slow ?  ?  ?General Gait Details: limited to a few slow labored side steps and steps forward/backwards using left hand to hold onto RW due to fatigue and poor standing balance ? ?Stairs ?  ?  ?  ?  ?  ? ?Wheelchair Mobility ?  ? ?Modified Rankin (Stroke Patients Only) ?  ? ?  ? ?Balance Overall balance assessment: Needs assistance ?Sitting-balance support: Feet supported, No upper extremity supported ?Sitting balance-Leahy Scale: Fair ?Sitting balance - Comments: seated at EOB ?  ?Standing balance support: During  functional activity, Single extremity supported ?Standing balance-Leahy Scale: Poor ?Standing balance comment: fair/poor holding onto RW with left hand ?  ?  ?  ?  ?  ?  ?  ?  ?  ?  ?  ?   ? ? ? ?Pertinent Vitals/Pain Pain Assessment ?Pain Assessment: Faces ?Faces Pain Scale:  Hurts even more ?Pain Location: R arm, back, R leg, head ?Pain Descriptors / Indicators: Grimacing, Guarding ?Pain Intervention(s): Limited activity within patient's tolerance, Monitored during session, Repositioned  ? ? ?Home Living Family/patient expects to be discharged to:: Private residence ?Living Arrangements: Alone ?Available Help at Discharge: Family;Available PRN/intermittently ?Type of Home: House ?Home Access: Stairs to enter ?Entrance Stairs-Rails: None ?Entrance Stairs-Number of Steps: 4 ?  ?Home Layout: One level ?Home Equipment: Conservation officer, nature (2 wheels);Cane - quad;Grab bars - tub/shower;Grab bars - toilet ?   ?  ?Prior Function Prior Level of Function : Needs assist ?  ?  ?  ?Physical Assist : Mobility (physical);ADLs (physical) ?Mobility (physical): Transfers;Gait ?ADLs (physical): Dressing;Bathing;Toileting;IADLs ?Mobility Comments: Pt reports household ambulation with quad cane but recently pt has been in bed much of the time. ?ADLs Comments: Assisted by daughter for bathing, dressing and toileting. Pt reports more set up assist but also later reported need for physical assist to don socks. Assisted by family for IADL's. ?  ? ? ?Hand Dominance  ? Dominant Hand: Right ? ?  ?Extremity/Trunk Assessment  ? Upper Extremity Assessment ?Upper Extremity Assessment: Defer to OT evaluation ?RUE Deficits / Details: Pt reporting pain in R arm limiting shoulder motion to trace amounts. Cast/splint on R wrist to elbow. ?RUE Coordination: decreased fine motor;decreased gross motor ?LUE Deficits / Details: 3+/5 grossly; generally weak ?LUE Coordination: WNL ?  ? ?Lower Extremity Assessment ?Lower Extremity Assessment: Generalized weakness ?  ? ?Cervical / Trunk Assessment ?Cervical / Trunk Assessment: Normal  ?Communication  ? Communication: No difficulties  ?Cognition Arousal/Alertness: Awake/alert ?Behavior During Therapy: [facility] for tasks assessed/performed ?Overall Cognitive Status: Within Functional Limits  for tasks assessed ?  ?  ?  ?  ?  ?  ?  ?  ?  ?  ?  ?  ?  ?  ?  ?  ?  ?  ?  ? ?  ?General Comments   ? ?  ?Exercises    ? ?Assessment/Plan  ?  ?PT Assessment Patient needs continued PT services  ?PT Problem List Decreased strength;Decreased activity tolerance;Decreased balance;Decreased range of motion;Decreased mobility ? ?   ?  ?PT Treatment Interventions DME instruction;Gait training;Stair training;Functional mobility training;Therapeutic activities;Therapeutic exercise;Balance training;Patient/family education   ? ?PT Goals (Current goals can be found in the Care Plan section)  ?Acute Rehab PT Goals ?Patient Stated Goal: return home after rehab ?PT Goal Formulation: With patient ?Time For Goal Achievement: 04/21/22 ?Potential to Achieve Goals: Good ? ?  ?Frequency Min 2X/week ?  ? ? ?Co-evaluation PT/OT/SLP Co-Evaluation/Treatment: Yes ?Reason for Co-Treatment: To address functional/ADL transfers ?  ?OT goals addressed during session: ADL's and self-care ?  ? ? ?  ?AM-PAC PT "6 Clicks" Mobility  ?Outcome Measure Help needed turning from your back to your side while in a flat bed without using bedrails?: A Little ?Help needed moving from lying on your back to sitting on the side of a flat bed without using bedrails?: A Lot ?Help needed moving to and from a bed to a chair (including a wheelchair)?: A Lot ?Help needed standing up from a chair using your arms (e.g., wheelchair  or bedside chair)?: A Lot ?Help needed to walk in hospital room?: A Lot ?Help needed climbing 3-5 steps with a railing? : A Lot ?6 Click Score: 13 ? ?  ?End of Session   ?Activity Tolerance: Patient tolerated treatment well;Patient limited by fatigue ?Patient left: in bed;with call bell/phone within reach ?Nurse Communication: Mobility status ?PT Visit Diagnosis: Unsteadiness on feet (R26.81);Other abnormalities of gait and mobility (R26.89);Muscle weakness (generalized) (M62.81) ?  ? ?Time: [id number] ?PT Time Calculation (min) (ACUTE ONLY):  28 min ? ? ?Charges:   PT Evaluation ?$PT Eval Moderate Complexity: 1 Mod ?PT Treatments ?$Therapeutic Activity: 23-37 mins ?  ?   ? ? ?12:04 PM, 04/07/22 ?Lonell Grandchild, MPT ?Physical Therapist with [facility]

## 2022-04-07 NOTE — Plan of Care (Signed)
?  Problem: Acute Rehab OT Goals (only OT should resolve) ?Goal: Pt. Will Perform Grooming ?Flowsheets (Taken 04/07/2022 4503) ?Pt Will Perform Grooming: ? with min guard assist ? standing ? with adaptive equipment ?Goal: Pt. Will Perform Lower Body Bathing ?Flowsheets (Taken 04/07/2022 8882) ?Pt Will Perform Lower Body Bathing: ? with min assist ? with min guard assist ? sitting/lateral leans ? with adaptive equipment ?Goal: Pt. Will Perform Upper Body Dressing ?Flowsheets (Taken 04/07/2022 8003) ?Pt Will Perform Upper Body Dressing: ? with supervision ? sitting ?Goal: Pt. Will Perform Lower Body Dressing ?Flowsheets (Taken 04/07/2022 4917) ?Pt Will Perform Lower Body Dressing: ? with min guard assist ? with min assist ? sitting/lateral leans ? with adaptive equipment ?Goal: Pt. Will Transfer To Toilet ?Flowsheets (Taken 04/07/2022 9150) ?Pt Will Transfer to Toilet: ? with supervision ? stand pivot transfer ?Goal: Pt/Caregiver Will Perform Home Exercise Program ?Flowsheets (Taken 04/07/2022 [phone]) ?Pt/caregiver will Perform Home Exercise Program: ? Increased strength ? Left upper extremity ? Increased ROM ? Right Upper extremity ? With minimal assist ? Chattie Greeson OT, MOT ? ?

## 2022-04-07 NOTE — ED Notes (Signed)
Pt working with physical therapy at this time.

## 2022-04-07 NOTE — ED Provider Notes (Signed)
Emergency Medicine Observation Re-evaluation Note ? ?Jamie [name] is a 66 y.o. female, seen on rounds today.  Pt initially presented to the ED for complaints of Medical Clearance ?Currently, the patient is awaiting PT/OT and TOC.  She notes that she is feeling better today and not as weak.. ? ?Physical Exam  ?BP (!) 149/62   Pulse 79   Temp 98.7 ?F (37.1 ?C) (Oral)   Resp 20   Ht 5\' 5"  (1.651 m)   Wt 63.5 kg   SpO2 96%   BMI 23.30 kg/m?  ?Physical Exam ?General: No acute distress, was resting comfortably ?Cardiac: Regular rate and rhythm ?Lungs: Normal effort ?Psych: No psychosis ? ?ED Course / MDM  ?EKG:  ? ?I have reviewed the labs performed to date as well as medications administered while in observation.  Recent changes in the last 24 hours include home meds and potassium being ordered. ? ?Plan  ?Current plan is for PT/OT eval and TOC eval.  We will recheck her BMP this morning. ? Jamie [name] is not under involuntary commitment. ? ? ?  ?Pricilla Loveless, MD ?04/07/22 [phone] ? ?

## 2022-04-07 NOTE — Progress Notes (Signed)
Transition of Care [facility]) - Emergency Department Mini Assessment ? ? ?Patient Details  ?Name: Jamie [name] ?MRN: [id number] ?Date of Birth: 24-May-1956 ? ?Transition of Care (TOC) CM/SW Contact:    ?Lavenia Atlas, RN ?Phone Number: ?04/07/2022, 12:23 PM ? ? ?Clinical Narrative: ?RNCM received TOC consult for SNF placement. Patient presented to [facility] ED for multiple falls over a 9 day timeframe. Patient was IVC'd by daughter.     ? ?RNCM attempted to call patient x2 in the room, within no response. RNCM notified bedside RN of need to speak with patient. ?Inpt TOC SW assisted with speaking with the patient. This RNCM spoke with patient who agrees to SNF placement however request this RNCM speak with her daughter for additional assistance.  ? ?RNCM spoke with patient's daughter Robbie Lis. [phone] regarding SNF placement. Patient's daughter request SNF placement in [place] (preference is [place]) & [place]. Daughter lives in [place] and works in [employer]. RNCM advised the insurance authorization may take a couple of days however the patient will remain in the ED and will not be admitted.  ? ?TOC will continue monitor. ? ?ED Mini Assessment: ?What brought you to the Emergency Department? : IVC , not caring for herself. ? ?Barriers to Discharge: Continued Medical Work up ? ? ?Interventions which prevented an admission or readmission: SNF Placement ? ? ? ?Patient Contact and Communications ?  ? Cathren Harsh [phone] ?  ?  ?  ?Admission diagnosis:  IVC ?Patient Active Problem List  ? Diagnosis Date Noted  ? Hypokalemia 04/06/2022  ? Falls frequently 04/06/2022  ? Alcohol abuse 04/06/2022  ? Opiate dependence --Chronic pain 04/06/2022  ? Benzodiazepine dependence /Chronic anxiety 04/06/2022  ? Abdominal pain   ? Nausea vomiting and diarrhea 05/08/2016  ? Sepsis, unspecified organism (HCC) 05/08/2016  ? Migraine 05/08/2016  ? AKI (acute kidney injury) (HCC) 05/08/2016  ? 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 Anxiety 05/08/2016  ? Sepsis (HCC) 05/08/2016  ? ?PCP:  Barbie Banner, MD ?Pharmacy:   ?[id number] - [place], [place] - [address] ?[address] ?[place] [address] ?Phone: [phone] Fax: [phone] ?  ?

## 2022-04-07 NOTE — Evaluation (Signed)
Occupational Therapy Evaluation ?Patient Details ?Name: Jamie [name] ?MRN: [id number] ?DOB: December 10, 1956 ?Today's Date: 04/07/2022 ? ? ?History of Present Illness Patient presents ER complaint of falls.  She is fallen several times in the last 10 days.  She uses a cane and a walker at times but intermittently.  Complaining of right forearm pain and headache.  Denies any vomiting or diarrhea.  There is have a history of chronic alcohol use.  Daughter states that they have tried to call social services and try to have the patient go to an assisted living facility but has been refusing additional health thus far. (per MD)  ? ?Clinical Impression ?  ?Pt agreeable to OT and PT co-evaluation. Pt reportedly assisted minimally at baseline but with increased difficulty as of late. Pt reported staying in bed most of the time recently. This date pt presents with R wrist to elbow splint with pain limiting R UE mobility. Pt demonstrates need for assist for bed mobility and transfers. L UE support needed for short ambulation in room with increase in pain. Pt appears to be very weak and demonstrates slow labored movements. Pt was left in bed with call bell within reach. Pt will benefit from continued OT in the hospital and recommended venue below to increase strength, balance, and endurance for safe ADL's.  ?   ? ?Recommendations for follow up therapy are one component of a multi-disciplinary discharge planning process, led by the attending physician.  Recommendations may be updated based on patient status, additional functional criteria and insurance authorization.  ? ?Follow Up Recommendations ? Skilled nursing-short term rehab (<3 hours/day)  ?  ?Assistance Recommended at Discharge Intermittent Supervision/Assistance  ?Patient can return home with the following A little help with walking and/or transfers;A lot of help with bathing/dressing/bathroom;Assistance with cooking/housework;Assistance with feeding;Help with stairs or ramp  for entrance;Assist for transportation ? ?  ?Functional Status Assessment ? Patient has had a recent decline in their functional status and demonstrates the ability to make significant improvements in function in a reasonable and predictable amount of time.  ?Equipment Recommendations ? None recommended by OT  ?  ?Recommendations for Other Services   ? ? ?  ?Precautions / Restrictions Precautions ?Precautions: Fall ?Precaution Comments: R wrist to elbow splint ?Restrictions ?Weight Bearing Restrictions: No  ? ?  ? ?Mobility Bed Mobility ?Overal bed mobility: Needs Assistance ?Bed Mobility: Supine to Sit, Sit to Supine ?  ?  ?Supine to sit: Min assist ?Sit to supine: Min assist ?  ?General bed mobility comments: slow labored movement ?  ? ?Transfers ?Overall transfer level: Needs assistance ?Equipment used: Rolling walker (2 wheels) (RW used to side only with L UE.) ?Transfers: Sit to/from Stand, Bed to chair/wheelchair/BSC ?Sit to Stand: Mod assist ?  ?  ?Step pivot transfers: Mod assist ?  ?  ?General transfer comment: Slow labored movement with increase in pain. ?  ? ?  ?Balance Overall balance assessment: Needs assistance ?Sitting-balance support: Feet supported, Single extremity supported ?Sitting balance-Leahy Scale: Fair ?Sitting balance - Comments: at EOB ?  ?Standing balance support: Single extremity supported, During functional activity, Reliant on assistive device for balance ?Standing balance-Leahy Scale: Poor ?Standing balance comment: poor to fair with RW to L side due to inability to use R UE with RW. ?  ?  ?  ?  ?  ?  ?  ?  ?  ?  ?  ?   ? ?ADL either performed or assessed with clinical judgement  ? ?  ADL Overall ADL's : Needs assistance/impaired ?Eating/Feeding: Set up;Sitting ?  ?Grooming: Standing;Minimal assistance ?  ?  ?  ?Lower Body Bathing: Moderate assistance;Maximal assistance;Sitting/lateral leans ?  ?Upper Body Dressing : Minimal assistance;Sitting ?  ?Lower Body Dressing: Maximal  assistance;Bed level ?Lower Body Dressing Details (indicate cue type and reason): Pt reports assist at baseline. ?Toilet Transfer: Moderate assistance;Rolling walker (2 wheels);Stand-pivot ?Toilet Transfer Details (indicate cue type and reason): Partially simulated via sit to stand and steps forward, backward, and to the side with holding RW with L UE. ?Toileting- Clothing Manipulation and Hygiene: Moderate assistance;Sit to/from stand;Minimal assistance ?  ?  ?  ?Functional mobility during ADLs: Moderate assistance;Cane ?General ADL Comments: Limited by pain and weakness. Unsteady in standing.  ? ? ? ?Vision Baseline Vision/History: 1 Wears glasses ?Ability to See in Adequate Light: 1 Impaired ?Patient Visual Report: No change from baseline ?Vision Assessment?: No apparent visual deficits  ?   ?   ?  ?   ?  ? ?Pertinent Vitals/Pain Pain Assessment ?Pain Assessment: Faces ?Faces Pain Scale: Hurts even more ?Pain Location: R arm, back, R leg, head ?Pain Descriptors / Indicators: Grimacing, Guarding ?Pain Intervention(s): Limited activity within patient's tolerance, Monitored during session, Repositioned  ? ? ? ?Hand Dominance Right ?  ?Extremity/Trunk Assessment Upper Extremity Assessment ?Upper Extremity Assessment: RUE deficits/detail;LUE deficits/detail ?RUE Deficits / Details: Pt reporting pain in R arm limiting shoulder motion to trace amounts. Cast/splint on R wrist to elbow. ?RUE Coordination: decreased fine motor;decreased gross motor ?LUE Deficits / Details: 3+/5 grossly; generally weak ?LUE Coordination: WNL ?  ?Lower Extremity Assessment ?Lower Extremity Assessment: Defer to PT evaluation ?  ?Cervical / Trunk Assessment ?Cervical / Trunk Assessment: Normal ?  ?Communication Communication ?Communication: No difficulties ?  ?Cognition Arousal/Alertness: Awake/alert ?Behavior During Therapy: [facility] for tasks assessed/performed ?Overall Cognitive Status: Within Functional Limits for tasks assessed ?  ?  ?  ?  ?   ?  ?  ?  ?  ?  ?  ?  ?  ?  ?  ?  ?  ?  ?  ?   ? ?  ?   ?  ?    ? ? ?Home Living Family/patient expects to be discharged to:: Private residence ?Living Arrangements: Alone ?Available Help at Discharge: Family;Available PRN/intermittently ?Type of Home: House ?Home Access: Stairs to enter ?Entrance Stairs-Number of Steps: 4 ?Entrance Stairs-Rails: None ?Home Layout: One level ?  ?  ?Bathroom Shower/Tub: Tub/shower unit ?  ?Bathroom Toilet: Handicapped height ?Bathroom Accessibility: Yes ?How Accessible: Accessible via walker ?Home Equipment: Conservation officer, nature (2 wheels);Cane - quad;Grab bars - tub/shower;Grab bars - toilet ?  ?  ?  ? ?  ?Prior Functioning/Environment Prior Level of Function : Needs assist ?  ?  ?  ?Physical Assist : Mobility (physical);ADLs (physical) ?Mobility (physical): Transfers;Gait ?ADLs (physical): Dressing;Bathing;Toileting;IADLs ?Mobility Comments: Pt reports household ambulation with quad cane but recently pt has been in bed much of the time. ?ADLs Comments: Assisted by daughter for bathing, dressing and toileting. Pt reports more set up assist but also later reported need for physical assist to don socks. Assisted by family for IADL's. ?  ? ?  ?  ?OT Problem List: Decreased strength;Decreased range of motion;Decreased activity tolerance;Impaired balance (sitting and/or standing);Impaired UE functional use;Pain ?  ?   ?OT Treatment/Interventions: Self-care/ADL training;Therapeutic exercise;DME and/or AE instruction;Therapeutic activities;Patient/family education;Balance training  ?  ?OT Goals(Current goals can be found in the care plan section) Acute Rehab OT Goals ?Patient  Stated Goal: return home ?OT Goal Formulation: With patient ?Time For Goal Achievement: 04/21/22 ?Potential to Achieve Goals: Fair  ?OT Frequency: Min 2X/week ?  ? ?Co-evaluation PT/OT/SLP Co-Evaluation/Treatment: Yes ?Reason for Co-Treatment: To address functional/ADL transfers ?  ?OT goals addressed during session: ADL's  and self-care ?  ? ?  ?AM-PAC OT "6 Clicks" Daily Activity     ?Outcome Measure Help from another person eating meals?: A Little ?Help from another person taking care of personal grooming?: A Little ?Help from ano

## 2022-04-07 NOTE — Plan of Care (Signed)
?  Problem: Acute Rehab PT Goals(only PT should resolve) ?Goal: Pt Will Go Supine/Side To Sit ?Outcome: Progressing ?Flowsheets (Taken 04/07/2022 1205) ?Pt will go Supine/Side to Sit: ? with min guard assist ? with minimal assist ?Goal: Patient Will Transfer Sit To/From Stand ?Outcome: Progressing ?Flowsheets (Taken 04/07/2022 1205) ?Patient will transfer sit to/from stand: ? with min guard assist ? with minimal assist ?Goal: Pt Will Transfer Bed To Chair/Chair To Bed ?Outcome: Progressing ?Flowsheets (Taken 04/07/2022 1205) ?Pt will Transfer Bed to Chair/Chair to Bed: with min assist ?Goal: Pt Will Ambulate ?Outcome: Progressing ?Flowsheets (Taken 04/07/2022 1205) ?Pt will Ambulate: ? 25 feet ? with minimal assist ? with other equipment (comment) ?Note: Quad-cane ?  ?12:06 PM, 04/07/22 ?Ocie Bob, MPT ?Physical Therapist with [place] ?[facility] ?[phone] office ?[phone] mobile phone ? ?

## 2022-04-07 NOTE — [place] FL2 (Signed)
?[place] MEDICAID FL2 LEVEL OF CARE SCREENING TOOL  ?  ? ?IDENTIFICATION  ?Patient Name: ?Jamie [name] Birthdate: 08/05/1956 Sex: female Admission Date (Current Location): ?04/06/2022  ?[place] Number: ? [place] ?  Facility and Address:  ?[facility],  [address] [place] [address] ?     Provider Number: ?[id number]  ?Attending Physician Name and Address:  ?Default, Provider, MD ? Relative Name and Phone Number:  ?Thrasher,Chelsea (Daughter)   [phone] ?   ?Current Level of Care: ?Hospital Recommended Level of Care: ?Skilled Nursing Facility Prior Approval Number: ?  ? ?Date Approved/Denied: ?  PASRR Number: ?[id number] A ? ?Discharge Plan: ?SNF ?  ? ?Current Diagnoses: ?Patient Active Problem List  ? Diagnosis Date Noted  ? Hypokalemia 04/06/2022  ? Falls frequently 04/06/2022  ? Alcohol abuse 04/06/2022  ? Opiate dependence --Chronic pain 04/06/2022  ? Benzodiazepine dependence /Chronic anxiety 04/06/2022  ? Abdominal pain   ? Nausea vomiting and diarrhea 05/08/2016  ? Sepsis, unspecified organism (HCC) 05/08/2016  ? Migraine 05/08/2016  ? AKI (acute kidney injury) (HCC) 05/08/2016  ? 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 Anxiety 05/08/2016  ? Sepsis (HCC) 05/08/2016  ? ? ?Orientation RESPIRATION BLADDER Height & Weight   ?  ?Self, Time, Situation, Place ? Normal Continent Weight: 140 lb (63.5 kg) ?Height:  5\' 5"  (165.1 cm)  ?BEHAVIORAL SYMPTOMS/MOOD NEUROLOGICAL BOWEL NUTRITION STATUS  ?    Continent Diet (regaular)  ?AMBULATORY STATUS COMMUNICATION OF NEEDS Skin   ?Limited Assist Verbally Normal ?  ?  ?  ?    ?     ?     ? ? ?Personal Care Assistance Level of Assistance  ?Bathing, Feeding, Dressing Bathing Assistance: Limited assistance ?Feeding assistance: Independent ?Dressing Assistance: Limited assistance ?   ? ?Functional Limitations Info  ?Sight, Hearing, Speech Sight Info: Adequate ?Hearing Info: Adequate ?Speech Info: Adequate  ? ? ?SPECIAL CARE FACTORS FREQUENCY  ?    ?   ?  ?  ?  ?  ?  ?   ? ? ?Contractures Contractures Info: Not present  ? ? ?Additional Factors Info  ?Code Status, Allergies Code Status Info: full ?Allergies Info: Amoxicillin-pot Clavulanate,Azithromycin,Bupropion,Nsaids,Prednisone,Vilazodone ?  ?  ?  ?   ? ?Current Medications (04/07/2022):  This is the current hospital active medication list ?Current Facility-Administered Medications  ?Medication Dose Route Frequency Provider Last Rate Last Admin  ? acetaminophen (TYLENOL) tablet 650 mg  650 mg Oral Q6H PRN Emokpae, Courage, MD      ? butalbital-acetaminophen-caffeine (FIORICET) 50-325-40 MG per tablet 1 tablet  1 tablet Oral Q4H PRN Pricilla Loveless, MD   1 tablet at 04/07/22 1418  ? oxyCODONE (OXYCONTIN) 12 hr tablet 10 mg  10 mg Oral QID PRN Bethann Berkshire, MD   10 mg at 04/07/22 1013  ? pantoprazole (PROTONIX) EC tablet 40 mg  40 mg Oral Daily Bethann Berkshire, MD   40 mg at 04/07/22 1013  ? traZODone (DESYREL) tablet 100 mg  100 mg Oral Marvis Moeller, MD   100 mg at 04/06/22 2134  ? ?Current Outpatient Medications  ?Medication Sig Dispense Refill  ? oxyCODONE (OXYCONTIN) 20 mg 12 hr tablet Take 10 mg by mouth 4 (four) times daily as needed (pain).    ? pantoprazole (PROTONIX) 40 MG tablet Take 40 mg by mouth daily.    ? traZODone (DESYREL) 100 MG tablet Take 100 mg by mouth at bedtime.    ? potassium chloride (KLOR-CON) 10 MEQ tablet Take  1 tablet (10 mEq total) by mouth 2 (two) times daily for 7 days. (Patient not taking: Reported on 04/06/2022) 14 tablet 0  ? ? ? ?Discharge Medications: ?Please see discharge summary for a list of discharge medications. ? ?Relevant Imaging Results: ? ?Relevant Lab Results: ? ? ?Additional Information ?SSN:[id number] ? ?Glenis Musolf M Chrystel Barefield, LCSW ? ? ? ? ?

## 2022-04-07 NOTE — ED Provider Notes (Signed)
Potassium has normalized. ?  ?Jamie Loveless, MD ?04/07/22 0913 ? ?

## 2022-04-07 NOTE — Progress Notes (Signed)
Transition of Care [facility](TOC) - Emergency Department Mini Assessment ? ? ?Patient Details  ?Name: Jamie [name] ?MRN: [id number] ?Date of Birth: Aug 01, 1956 ? ?Transition of Care (TOC) CM/SW Contact:    ?Jamie Shaggyhristina M Fleet Higham, LCSW ?Phone Number: ?04/07/2022, 10:30 AM ? ? ?Clinical Narrative: ? ?CSW received a consult due to possible SNF placement. CSW received a call from pt's APS worker [name] [phone] ex [phone])  she stated she has spoken with pt's [name], Jamie [name] if SNF is recommended, [facility]. Pt's APS worker stated she has been working with the family for a week. pt's APS worker reported pt has refused placement in the past. She reported pt's home is not safe to return to as pt has Hoarded a lot of items in the home. Jamie the APS worker pt's [name] is requesting short-term placement so she can clean the home. CSW explained pt is oriented and has the capacity to make her own decision. CSW informed pt's APS worker this Clinical research associatewriter will try to convince pt to go to a facility if recommended but she will d/c home if she refuses placement. TOC to follow. PT eval pending.  ? ? ?ED Mini Assessment: ?What brought you to the Emergency Department? : IVC , not caring for herself. ? ?Barriers to Discharge: Continued Medical Work up ? ?  ? ?  ? ?Interventions which prevented an admission or readmission: SNF Placement ? ? ? ?Patient Contact and Communications ?  ?  ?  ? ,     ?  ?  ? ?  ?  ?  ? ?Admission diagnosis:  IVC ?Patient Active Problem List  ? Diagnosis Date Noted  ? Hypokalemia 04/06/2022  ? Falls frequently 04/06/2022  ? Alcohol abuse 04/06/2022  ? Opiate dependence --Chronic pain 04/06/2022  ? Benzodiazepine dependence /Chronic anxiety 04/06/2022  ? Abdominal pain   ? Nausea vomiting and diarrhea 05/08/2016  ? Sepsis, unspecified organism (HCC) 05/08/2016  ? Migraine 05/08/2016  ? AKI (acute kidney injury) (HCC) 05/08/2016  ? 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 Anxiety  05/08/2016  ? Sepsis (HCC) 05/08/2016  ? ?PCP:  Barbie BannerWilson, Fred H, MD ?Pharmacy:   ?[id number] - [place], [place] - [address] ?[address] ?[place] [id number] ?Phone: [phone] Fax: [phone] ?  ?

## 2022-04-07 NOTE — ED Notes (Signed)
Pt given meal tray with setup at bedside. ?

## 2022-04-08 MED ORDER — OXYCODONE HCL 5 MG PO TABS
10.0000 mg | ORAL_TABLET | Freq: Four times a day (QID) | ORAL | Status: DC | PRN
  Administered 2022-04-08 – 2022-04-11 (×11): 10 mg via ORAL
  Filled 2022-04-08 (×11): qty 2

## 2022-04-08 NOTE — ED Notes (Signed)
Resting quietly with eyes closed. Vitals stable. Side rails up. Call bell in reach.  ?

## 2022-04-08 NOTE — ED Notes (Signed)
Called [facility] to verify oxycodone regimen that patient is taking and patient is taking Oxycodone 10mg  IR q6h PRN pain NOT oxycotin 10mg  ER 4 times a day.  ?

## 2022-04-08 NOTE — ED Notes (Signed)
Provided patient with meal tray, requesting pain medication advised I can give it in about 65mins ?

## 2022-04-08 NOTE — Progress Notes (Signed)
Physical Therapy Treatment ?Patient Details ?Name: Jamie [name] ?MRN: [id number] ?DOB: [birth year] ?Today's Date: 04/08/2022 ? ? ?History of Present Illness Patient presents ER complaint of falls.  She is fallen several times in the last 10 days.  She uses a cane and a walker at times but intermittently.  Complaining of right forearm pain and headache.  Denies any vomiting or diarrhea.  There is have a history of chronic alcohol use.  Daughter states that they have tried to call social services and try to have the patient go to an assisted living facility but has been refusing additional health thus far. (per MD) ? ?  ?PT Comments  ? ? Patient is finishing up breakfast on therapist arrival.  She states 9/10 pain Right wrist today and she also has a migraine.  Therapist guides patient in supine lower extremity therapeutic exercises to include ankle pumps, hip Abduction and heels slides x 10 repetitions each.  She needs min A for her legs and extra time to come to the EOB.  She is able to sit with the support of her Left UE only; feet dangling.  She refuses to stand today secondary to not feeling well.  Returns to supine with min A for legs only and extra time.  Nurse call bell and phone within reach; nursing notified of mobility status. Patient will benefit from continued skilled therapy services during her stay here at the hospital and at the next recommended venue to address deficits and promote safe functional mobility.  ? ?  ?Recommendations for follow up therapy are one component of a multi-disciplinary discharge planning process, led by the attending physician.  Recommendations may be updated based on patient status, additional functional criteria and insurance authorization. ? ?Follow Up Recommendations ? Skilled nursing-short term rehab (<3 hours/day) ?  ?  ?Assistance Recommended at Discharge Intermittent Supervision/Assistance  ?Patient can return home with the following A lot of help with walking and/or  transfers;A lot of help with bathing/dressing/bathroom;Assistance with cooking/housework;Help with stairs or ramp for entrance ?  ?Equipment Recommendations ? None recommended by PT  ?  ?Recommendations for Other Services   ? ? ?  ?Precautions / Restrictions Precautions ?Precautions: Fall ?Precaution Comments: R wrist to elbow splint ?Restrictions ?Weight Bearing Restrictions: No  ?  ? ?Mobility ? Bed Mobility ?Overal bed mobility: Needs Assistance ?Bed Mobility: Supine to Sit, Sit to Supine ?  ?  ?Supine to sit: Min assist ?Sit to supine: Min assist ?  ?General bed mobility comments: takes extra time; min assist for legs ?  ? ?Transfers ?  ?  ?  ?  ?  ?  ?  ?  ?  ?General transfer comment: declined sit to stand today; " I have a migraine" ?  ? ?Ambulation/Gait ?  ?  ?  ?  ?  ?  ?  ?  ? ? ?Stairs ?  ?  ?  ?  ?  ? ? ?Wheelchair Mobility ?  ? ?Modified Rankin (Stroke Patients Only) ?  ? ? ?  ?Balance Overall balance assessment: Needs assistance ?Sitting-balance support: Single extremity supported, Feet unsupported ?Sitting balance-Leahy Scale: Fair ?Sitting balance - Comments: seated at EOB ?  ?  ?  ?  ?  ?  ?  ?  ?  ?  ?  ?  ?  ?  ?  ?  ? ?  ?Cognition Arousal/Alertness: Awake/alert ?Behavior During Therapy: [facility] for tasks assessed/performed ?Overall Cognitive Status: Within Functional Limits for tasks assessed ?  ?  ?  ?  ?  ?  ?  ?  ?  ?  ?  ?  ?  ?  ?  ?  ?  ?  ?  ? ?  ?  Exercises   ? ?  ?General Comments   ?  ?  ? ?Pertinent Vitals/Pain Pain Assessment ?Pain Assessment: 0-10 ?Pain Score: 9  ?Pain Location: R arm, back, R leg, head ?Pain Intervention(s): Limited activity within patient's tolerance  ? ? ?Home Living   ?  ?  ?  ?  ?  ?  ?  ?  ?  ?   ?  ?Prior Function    ?  ?  ?   ? ?PT Goals (current goals can now be found in the care plan section) Acute Rehab PT Goals ?Patient Stated Goal: return home after rehab ?PT Goal Formulation: With patient ?Time For Goal Achievement: 04/21/22 ?Potential to Achieve Goals:  Good ? ?  ?Frequency ? ? ? Min 2X/week ? ? ? ?  ?PT Plan    ? ? ?Co-evaluation   ?  ?  ?  ?  ? ?  ?AM-PAC PT "6 Clicks" Mobility   ?Outcome Measure ? Help needed turning from your back to your side while in a flat bed without using bedrails?: A Little ?Help needed moving from lying on your back to sitting on the side of a flat bed without using bedrails?: A Lot ?Help needed moving to and from a bed to a chair (including a wheelchair)?: A Lot ?Help needed standing up from a chair using your arms (e.g., wheelchair or bedside chair)?: A Lot ?Help needed to walk in hospital room?: A Lot ?Help needed climbing 3-5 steps with a railing? : A Lot ?6 Click Score: 13 ? ?  ?End of Session   ?Activity Tolerance: Patient tolerated treatment well;Patient limited by pain ?Patient left: in bed;with call bell/phone within reach ?Nurse Communication: Mobility status ?PT Visit Diagnosis: Unsteadiness on feet (R26.81);Other abnormalities of gait and mobility (R26.89);Muscle weakness (generalized) (M62.81) ?  ? ? ?Time: 1610-9604 ?PT Time Calculation (min) (ACUTE ONLY): 23 min ? ?Charges:  $Therapeutic Exercise: 23-37 mins          ?          ? ?9:49 AM, 04/08/22 ?Raynell Scott Small Jeriko Kowalke MPT ?[facility] ?Welcome [phone] ?Ph:[phone] ? ? ?

## 2022-04-08 NOTE — ED Notes (Signed)
Pt moved to rm 9, pt A&Ox4, NAD noted. Pt c/o pain, informed pt she can have pain med when allowed. Pt given water, vaseline, and cleaned per request. Pt cleaned, gown and bed linen replaced, purewick replaced. Sacral barrier applied, pt has non-blanching redness on sacrum, red, raised bumps noted on sacrum. Pt placed on left side.   ?

## 2022-04-08 NOTE — TOC Progression Note (Signed)
Transition of Care (TOC) - Progression Note  ? ? ?Patient Details  ?Name: Jamie [name] ?MRN: [id number] ?Date of Birth: [birth year] ? ?Transition of Care (TOC) CM/SW Contact  ?[place], RN ?Phone Number: ?04/08/2022, 2:39 PM ? ?Clinical Narrative:    ?Spoke to [place], Daughter who was requesting update on SNF transfer. Reports patient called her and was getting inpatient. Updated that [facility] had made offer and insurance authorization was still needed. Anticipating patient will discharge over the weekend or Monday morning depending on insurance and facility availability.  ? ? ?  ?Barriers to Discharge: Continued Medical Work up ? ?Expected Discharge Plan and Services ?  ?  ?  ?  ?  ?                ?  ?  ?  ?  ?  ?  ?  ?  ?  ?  ? ? ?Social Determinants of Health (SDOH) Interventions ?  ? ?Readmission Risk Interventions ?   ? View : No data to display.  ?  ?  ?  ? ? ?

## 2022-04-08 NOTE — ED Notes (Signed)
Meal tray provided and set up for patient.  

## 2022-04-08 NOTE — ED Notes (Signed)
Resting quietly. Eyes closed. No signs of distress. Side rails up. Call bell in reach.  ?

## 2022-04-08 NOTE — Progress Notes (Signed)
SNF beds pending.  ? ?Jamie [name] Jamie [name], MSW, LCSWA ?Jamie [name]  Transitions of Care ?Clinical Social Worker I ?Direct Dial: [phone]  Fax: [phone] ?Manly [email or web address]  ?

## 2022-04-08 NOTE — ED Notes (Signed)
Repositioned for comfort. A&O x 4. No signs of distress.  ?

## 2022-04-09 NOTE — ED Provider Notes (Signed)
Emergency Medicine Observation Re-evaluation Note ? ?Jamie [name] is a 66 y.o. female, seen on rounds today.  Pt initially presented to the ED for complaints of Medical Clearance ?Currently, the patient is resting comfortably. ? ?Physical Exam  ?BP 106/61   Pulse 73   Temp 97.9 ?F (36.6 ?C) (Oral)   Resp 18   Ht 5\' 5"  (1.651 m)   Wt 63.5 kg   SpO2 95%   BMI 23.30 kg/m?  ?Physical Exam ?General: asleep ?Cardiac: rrr ?Lungs: cta ?Psych: asleep ? ?ED Course / MDM  ?EKG:EKG Interpretation ? ?Date/Time:  Wednesday April 06 2022 19:21:59 EDT ?Ventricular Rate:  91 ?PR Interval:  60 ?QRS Duration: 71 ?QT Interval:  383 ?QTC Calculation: 472 ?R Axis:   20 ?Text Interpretation: Sinus rhythm Short PR interval Confirmed by Octaviano Glow [phone]) on 04/07/2022 9:40:16 AM ? ?I have reviewed the labs performed to date as well as medications administered while in observation.  Recent changes in the last 24 hours include PT eval.  They recommend SNF.  SW and CM working on placement. ? ?Plan  ?Current plan is for SNF placement. ? Jamie MELOTT is not under involuntary commitment. ? ? ?  ?Jamie Pence, MD ?04/09/22 [phone] ? ?

## 2022-04-10 NOTE — ED Notes (Signed)
Pt given meal tray.

## 2022-04-10 NOTE — ED Notes (Signed)
CSW left VM for [facility] requesting return call for update on auth status.  ?

## 2022-04-10 NOTE — ED Notes (Signed)
Pt woke up to request cup of water at this time. Pt resting comfortably. NAD noted.  ?

## 2022-04-10 NOTE — ED Notes (Signed)
Pt using call-bell to request more pain medication and medication for her headache. Pt informed she can only have her medication every 8 hrs and verbally understands maximum daily doses.  ?

## 2022-04-10 NOTE — ED Notes (Signed)
Pt eating meal

## 2022-04-10 NOTE — ED Notes (Signed)
Pt req ice water with her meal ?

## 2022-04-11 MED ORDER — LOPERAMIDE HCL 2 MG PO CAPS
2.0000 mg | ORAL_CAPSULE | Freq: Once | ORAL | Status: DC
Start: 2022-04-11 — End: 2022-04-12

## 2022-04-11 MED ORDER — OXYCODONE HCL 5 MG PO TABS: 10.0000 mg | ORAL_TABLET | Freq: Four times a day (QID) | ORAL | 0 refills | Status: DC | PRN

## 2022-04-11 NOTE — ED Provider Notes (Signed)
Emergency Medicine Observation Re-evaluation Note ? ?Jamie [name] is a 66 y.o. female, seen on rounds today.  Pt initially presented to the ED for complaints of Medical Clearance ?Currently, the patient is resting comfortably. ? ?Physical Exam  ?BP (!) 117/56   Pulse (!) 51   Temp 98.2 ?F (36.8 ?C) (Oral)   Resp (!) 25   Ht 5\' 5"  (1.651 m)   Wt 63.5 kg   SpO2 99%   BMI 23.30 kg/m?  ?Physical Exam ?She is in no respiratory distress ? ?ED Course / MDM  ?EKG:EKG Interpretation ? ?Date/Time:  Wednesday April 06 2022 19:21:59 EDT ?Ventricular Rate:  91 ?PR Interval:  60 ?QRS Duration: 71 ?QT Interval:  383 ?QTC Calculation: 472 ?R Axis:   20 ?Text Interpretation: Sinus rhythm Short PR interval Confirmed by Octaviano Glow [phone]) on 04/07/2022 9:40:16 AM ? ?I have reviewed the labs performed to date as well as medications administered while in observation.  Recent changes in the last 24 hours include : No acute changes. ? ?Plan  ?Current plan is for patient to be placed.  She is both medically and psych cleared ? Jamie [name] is not under involuntary commitment. ? ? ?  ?Varney Biles, MD ?04/11/22 1550 ? ?

## 2022-04-11 NOTE — ED Notes (Signed)
Insurance auth pending at this time. TOC to follow.  ?

## 2022-04-11 NOTE — ED Notes (Signed)
Pts insurance Jamie [name] has been approved and pt can dc to [facility] today. CSW updated RN that pt will go to room A3 bed 1 and the number for report is [phone]. CSW to send AVS to admissions at facility via email once it has been completed. CSW updated pts daughter that pt would dc today. TOC signing off.  ?

## 2022-04-11 NOTE — ED Notes (Signed)
Lunch meal tray given to pt  

## 2022-04-11 NOTE — Progress Notes (Signed)
Physical Therapy Treatment ?Patient Details ?Name: Jamie [name] ?MRN: [id number] ?DOB: [birth year] ?Today's Date: 04/11/2022 ? ? ?History of Present Illness Patient presents ER complaint of falls.  She is fallen several times in the last 10 days.  She uses a cane and a walker at times but intermittently.  Complaining of right forearm pain and headache.  Denies any vomiting or diarrhea.  There is have a history of chronic alcohol use.  Daughter states that they have tried to call social services and try to have the patient go to an assisted living facility but has been refusing additional health thus far. (per MD) ? ?  ?PT Comments  ? ? Patient requires min assist for bed mobility and initially requires assist for seated balance but is able to maintain independently. She completes seated exercises at EOB demonstrating fair sitting tolerance and sitting balance. Patient notes fatigue at end of session. Patient will benefit from continued skilled physical therapy in hospital and recommended venue below to increase strength, balance, endurance for safe ADLs and gait. ?   ?Recommendations for follow up therapy are one component of a multi-disciplinary discharge planning process, led by the attending physician.  Recommendations may be updated based on patient status, additional functional criteria and insurance authorization. ? ?Follow Up Recommendations ? Skilled nursing-short term rehab (<3 hours/day) ?  ?  ?Assistance Recommended at Discharge Intermittent Supervision/Assistance  ?Patient can return home with the following A lot of help with walking and/or transfers;A lot of help with bathing/dressing/bathroom;Assistance with cooking/housework;Help with stairs or ramp for entrance ?  ?Equipment Recommendations ? None recommended by PT  ?  ?Recommendations for Other Services   ? ? ?  ?Precautions / Restrictions Precautions ?Precautions: Fall ?Precaution Comments: R wrist to elbow splint ?Restrictions ?Weight Bearing  Restrictions: No  ?  ? ?Mobility ? Bed Mobility ?Overal bed mobility: Needs Assistance ?Bed Mobility: Supine to Sit, Sit to Supine ?  ?  ?Supine to sit: Min assist ?Sit to supine: Min assist ?  ?General bed mobility comments: takes extra time; min assist for legs ?  ? ?Transfers ?  ?  ?  ?  ?  ?  ?  ?  ?  ?  ?  ? ?Ambulation/Gait ?  ?  ?  ?  ?  ?  ?  ?  ? ? ?Stairs ?  ?  ?  ?  ?  ? ? ?Wheelchair Mobility ?  ? ?Modified Rankin (Stroke Patients Only) ?  ? ? ?  ?Balance Overall balance assessment: Needs assistance ?Sitting-balance support: Single extremity supported, Feet unsupported ?Sitting balance-Leahy Scale: Fair ?Sitting balance - Comments: seated at EOB ?  ?  ?  ?  ?  ?  ?  ?  ?  ?  ?  ?  ?  ?  ?  ?  ? ?  ?Cognition Arousal/Alertness: Awake/alert ?Behavior During Therapy: [facility] for tasks assessed/performed ?Overall Cognitive Status: Within Functional Limits for tasks assessed ?  ?  ?  ?  ?  ?  ?  ?  ?  ?  ?  ?  ?  ?  ?  ?  ?  ?  ?  ? ?  ?Exercises General Exercises - Lower Extremity ?Ankle Circles/Pumps: AROM, Left, 15 reps, Seated ?Long Arc Quad: AROM, Both, 15 reps, Seated ?Hip Flexion/Marching: AROM, Both, 15 reps, Seated ? ?  ?General Comments   ?  ?  ? ?Pertinent Vitals/Pain Pain Assessment ?Pain Score: 9  ?Pain Location:  all over ?Pain Descriptors / Indicators: Grimacing, Guarding ?Pain Intervention(s): Limited activity within patient's tolerance, Monitored during session  ? ? ?Home Living   ?  ?  ?  ?  ?  ?  ?  ?  ?  ?   ?  ?Prior Function    ?  ?  ?   ? ?PT Goals (current goals can now be found in the care plan section) Acute Rehab PT Goals ?Patient Stated Goal: return home after rehab ?PT Goal Formulation: With patient ?Time For Goal Achievement: 04/21/22 ?Potential to Achieve Goals: Good ? ?  ?Frequency ? ? ? Min 2X/week ? ? ? ?  ?PT Plan    ? ? ?Co-evaluation   ?  ?  ?  ?  ? ?  ?AM-PAC PT "6 Clicks" Mobility   ?Outcome Measure ? Help needed turning from your back to your side while in a flat bed without  using bedrails?: A Little ?Help needed moving from lying on your back to sitting on the side of a flat bed without using bedrails?: A Lot ?Help needed moving to and from a bed to a chair (including a wheelchair)?: A Lot ?Help needed standing up from a chair using your arms (e.g., wheelchair or bedside chair)?: A Lot ?Help needed to walk in hospital room?: A Lot ?Help needed climbing 3-5 steps with a railing? : A Lot ?6 Click Score: 13 ? ?  ?End of Session   ?Activity Tolerance: Patient tolerated treatment well;Patient limited by pain ?Patient left: in bed;with call bell/phone within reach ?Nurse Communication: Mobility status ?PT Visit Diagnosis: Unsteadiness on feet (R26.81);Other abnormalities of gait and mobility (R26.89);Muscle weakness (generalized) (M62.81) ?  ? ? ?Time: 1044-1100 ?PT Time Calculation (min) (ACUTE ONLY): 16 min ? ?Charges:  $Therapeutic Exercise: 8-22 mins          ?          ? ?11:14 AM, 04/11/22 ?Wyman Songster PT, DPT ?Physical Therapist at [facility] ?[facility] ? ? ?

## 2022-04-11 NOTE — ED Notes (Signed)
ED TO INPATIENT HANDOFF REPORT ? ?ED Nurse Name and Phone #:  ? ?S ?Name/Age/Gender ?Jamie [name] ?66 y.o. ?female ?Room/Bed: APAH5/APAH5 ? ?Code Status ?  Code Status: Prior ? ?Home/SNF/Other ?Nursing Home ?Patient oriented to: self, place, time, and situation ?Is this baseline? Yes  ? ?Triage Complete: Triage complete  ?Chief Complaint ?IVC ? ?Triage Note ?IVC'd by daughter. Cathren Harsh [phone].  ?  ? ?Allergies ?Allergies  ?Allergen Reactions  ? Amoxicillin-Pot Clavulanate Nausea And Vomiting  ?  Can tolerate plain Amoxicillin  ? Azithromycin Nausea And Vomiting  ?  Other reaction(s): Other (See Comments) ?unknown  ? Bupropion Nausea And Vomiting  ? Nsaids Other (See Comments)  ?  Stomach problems  ? Prednisone Diarrhea  ? Vilazodone Diarrhea  ? ? ?Level of Care/Admitting Diagnosis ?ED Disposition   ? ? ED Disposition  ?Discharge  ? Condition  ?--  ? Comment  ?The patient appears reasonably screened and/or stabilized for discharge and I doubt any other medical condition or other [facility] requiring further screening, evaluation, or treatment in the ED exists or is present at this time prior to discharge. ?  ?  ? ?  ? ? ?B ?Medical/Surgery History ?Past Medical History:  ?Diagnosis Date  ? Anxiety   ? Arthritis   ? Chronic pain   ? Gastroenteritis   ? Headache   ? Hypotension   ? Insomnia   ? Suicidal ideation   ? ?Past Surgical History:  ?Procedure Laterality Date  ? BACK SURGERY    ? CARPAL TUNNEL RELEASE    ? LAPAROSCOPIC OOPHERECTOMY    ? MOUTH SURGERY    ?  ? ?A ?IV Location/Drains/Wounds ?Patient Lines/Drains/Airways Status   ? ? Active Line/Drains/Airways   ? ? Name Placement date Placement time Site Days  ? Peripheral IV 04/06/22 22 G Anterior;Left Forearm 04/06/22  1605  Forearm  5  ? External Urinary Catheter 05/19/21  1733  --  327  ? Pressure Injury 05/19/21 Sacrum Stage 1 -  Intact skin with non-blanchable redness of a localized area usually over a bony prominence. blanchable redness 05/19/21   1400  -- 327  ? ?  ?  ? ?  ? ? ?Intake/Output Last 24 hours ?No intake or output data in the 24 hours ending 04/11/22 1554 ? ?Labs/Imaging ?No results found for this or any previous visit (from the past 48 hour(s)). ?No results found. ? ?Pending Labs ?Unresulted Labs (From admission, onward)  ? ? None  ? ?  ? ? ?Vitals/Pain ?Today's Vitals  ? 04/11/22 1300 04/11/22 1330 04/11/22 1400 04/11/22 1409  ?BP:  131/64 127/63   ?Pulse:   62   ?Resp:  ?Temp:      ?TempSrc:      ?SpO2:      ?Weight:      ?Height:      ?PainSc: 10-Worst pain ever     ? ? ?Isolation Precautions ?No active isolations ? ?Medications ?Medications  ?0.9 % NaCl with KCl 40 mEq / L  infusion (0 mL/hr Intravenous Stopped 04/07/22 0836)  ?acetaminophen (TYLENOL) tablet 650 mg (650 mg Oral Given 04/09/22 0232)  ?pantoprazole (PROTONIX) EC tablet 40 mg (40 mg Oral Given 04/11/22 1014)  ?traZODone (DESYREL) tablet 100 mg (100 mg Oral Given 04/10/22 2227)  ?butalbital-acetaminophen-caffeine (FIORICET) 50-325-40 MG per tablet 1-2 tablet (2 tablets Oral Given 04/11/22 1259)  ?oxyCODONE (Oxy IR/ROXICODONE) immediate release tablet 10 mg (10 mg Oral Given 04/11/22 1013)  ?sodium chloride  0.9 % bolus 1,000 mL (0 mLs Intravenous Stopped 04/06/22 1825)  ?potassium chloride 10 mEq in 100 mL IVPB (0 mEq Intravenous Stopped 04/06/22 2004)  ?oxyCODONE-acetaminophen (PERCOCET/ROXICET) 5-325 MG per tablet 1 tablet (1 tablet Oral Given 04/06/22 1725)  ?potassium chloride SA (KLOR-CON M) CR tablet 40 mEq (40 mEq Oral Given 04/06/22 2134)  ?potassium chloride 10 mEq in 100 mL IVPB (0 mEq Intravenous Stopped 04/07/22 0359)  ?alum & mag hydroxide-simeth (MAALOX/MYLANTA) 200-200-20 MG/5ML suspension 30 mL (30 mLs Oral Given 04/07/22 0007)  ? ? ?Mobility ?walks with device ?High fall risk  ? ?Focused Assessments ? ? ? ?R ?Recommendations: See Admitting Provider Note ? ?Report given to:  ? ?Additional Notes:  ?  ?

## 2022-05-04 ENCOUNTER — Other Ambulatory Visit: Payer: Self-pay

## 2022-05-04 ENCOUNTER — Emergency Department (HOSPITAL_COMMUNITY): Payer: Medicare HMO

## 2022-05-04 ENCOUNTER — Encounter (HOSPITAL_COMMUNITY): Payer: Self-pay

## 2022-05-04 ENCOUNTER — Emergency Department (HOSPITAL_COMMUNITY)
Admission: EM | Admit: 2022-05-04 | Discharge: 2022-05-09 | Disposition: A | Discharge status: Home / Self Care | Payer: Medicare HMO | Attending: Emergency Medicine | Admitting: Emergency Medicine

## 2022-05-04 DIAGNOSIS — M25562 Pain in left knee: Secondary | ICD-10-CM | POA: Diagnosis not present

## 2022-05-04 DIAGNOSIS — R531 Weakness: Secondary | ICD-10-CM

## 2022-05-04 DIAGNOSIS — F419 Anxiety disorder, unspecified: Secondary | ICD-10-CM | POA: Diagnosis present

## 2022-05-04 DIAGNOSIS — G43909 Migraine, unspecified, not intractable, without status migrainosus: Secondary | ICD-10-CM | POA: Diagnosis present

## 2022-05-04 DIAGNOSIS — R627 Adult failure to thrive: Secondary | ICD-10-CM | POA: Insufficient documentation

## 2022-05-04 DIAGNOSIS — Z79899 Other long term (current) drug therapy: Secondary | ICD-10-CM | POA: Insufficient documentation

## 2022-05-04 DIAGNOSIS — W19XXXA Unspecified fall, initial encounter: Secondary | ICD-10-CM | POA: Insufficient documentation

## 2022-05-04 DIAGNOSIS — E162 Hypoglycemia, unspecified: Secondary | ICD-10-CM | POA: Diagnosis present

## 2022-05-04 DIAGNOSIS — Z20822 Contact with and (suspected) exposure to covid-19: Secondary | ICD-10-CM | POA: Insufficient documentation

## 2022-05-04 DIAGNOSIS — M25561 Pain in right knee: Secondary | ICD-10-CM | POA: Insufficient documentation

## 2022-05-04 DIAGNOSIS — E876 Hypokalemia: Secondary | ICD-10-CM | POA: Diagnosis not present

## 2022-05-04 LAB — CBC WITH DIFFERENTIAL/PLATELET
Abs Immature Granulocytes: 0.05 10*3/uL (ref 0.00–0.07)
Basophils Absolute: 0.1 10*3/uL (ref 0.0–0.1)
Basophils Relative: 1 %
Eosinophils Absolute: 0.1 10*3/uL (ref 0.0–0.5)
Eosinophils Relative: 2 %
HCT: 42.3 % (ref 36.0–46.0)
Hemoglobin: 13.6 g/dL (ref 12.0–15.0)
Immature Granulocytes: 1 %
Lymphocytes Relative: 22 %
Lymphs Abs: 1.6 10*3/uL (ref 0.7–4.0)
MCH: 30.2 pg (ref 26.0–34.0)
MCHC: 32.2 g/dL (ref 30.0–36.0)
MCV: 94 fL (ref 80.0–100.0)
Monocytes Absolute: 0.3 10*3/uL (ref 0.1–1.0)
Monocytes Relative: 4 %
Neutro Abs: 5.1 10*3/uL (ref 1.7–7.7)
Neutrophils Relative %: 70 %
Platelets: 337 10*3/uL (ref 150–400)
RBC: 4.5 MIL/uL (ref 3.87–5.11)
RDW: 13.4 % (ref 11.5–15.5)
WBC: 7.2 10*3/uL (ref 4.0–10.5)
nRBC: 0 % (ref 0.0–0.2)

## 2022-05-04 LAB — URINALYSIS, ROUTINE W REFLEX MICROSCOPIC
Bilirubin Urine: NEGATIVE
Glucose, UA: NEGATIVE mg/dL
Ketones, ur: 20 mg/dL — AB
Nitrite: NEGATIVE
Protein, ur: NEGATIVE mg/dL
Specific Gravity, Urine: 1.016 (ref 1.005–1.030)
pH: 5 (ref 5.0–8.0)

## 2022-05-04 LAB — CBG MONITORING, ED: Glucose-Capillary: 64 mg/dL — ABNORMAL LOW (ref 70–99)

## 2022-05-04 LAB — COMPREHENSIVE METABOLIC PANEL
ALT: 20 U/L (ref 0–44)
AST: 19 U/L (ref 15–41)
Albumin: 3.4 g/dL — ABNORMAL LOW (ref 3.5–5.0)
Alkaline Phosphatase: 184 U/L — ABNORMAL HIGH (ref 38–126)
Anion gap: 15 (ref 5–15)
BUN: 10 mg/dL (ref 8–23)
CO2: 18 mmol/L — ABNORMAL LOW (ref 22–32)
Calcium: 9.8 mg/dL (ref 8.9–10.3)
Chloride: 104 mmol/L (ref 98–111)
Creatinine, Ser: 0.65 mg/dL (ref 0.44–1.00)
GFR, Estimated: >60 mL/min (ref >60)
Glucose, Bld: 64 mg/dL — ABNORMAL LOW (ref 70–99)
Potassium: 3 mmol/L — ABNORMAL LOW (ref 3.5–5.1)
Sodium: 137 mmol/L (ref 135–145)
Total Bilirubin: 0.8 mg/dL (ref 0.3–1.2)
Total Protein: 6.7 g/dL (ref 6.5–8.1)

## 2022-05-04 LAB — ETHANOL: Alcohol, Ethyl (B): <10 mg/dL (ref <10)

## 2022-05-04 LAB — LIPASE, BLOOD: Lipase: 28 U/L (ref 11–51)

## 2022-05-04 LAB — RESP PANEL BY RT-PCR (FLU A&B, COVID) ARPGX2
Influenza A by PCR: NEGATIVE
Influenza B by PCR: NEGATIVE
SARS Coronavirus 2 by RT PCR: NEGATIVE

## 2022-05-04 LAB — RAPID URINE DRUG SCREEN, HOSP PERFORMED
Amphetamines: NOT DETECTED
Barbiturates: POSITIVE — AB
Benzodiazepines: POSITIVE — AB
Cocaine: NOT DETECTED
Opiates: NOT DETECTED
Tetrahydrocannabinol: NOT DETECTED

## 2022-05-04 LAB — MAGNESIUM: Magnesium: 1.7 mg/dL (ref 1.7–2.4)

## 2022-05-04 MED ORDER — TRAZODONE HCL 50 MG PO TABS
100.0000 mg | ORAL_TABLET | Freq: Every day | ORAL | Status: DC
Start: 2022-05-04 — End: 2022-05-09
  Administered 2022-05-04 – 2022-05-09 (×5): 100 mg via ORAL
  Filled 2022-05-04 (×6): qty 2

## 2022-05-04 MED ORDER — BUTALBITAL-APAP-CAFFEINE 50-325-40 MG PO TABS
ORAL_TABLET | ORAL | Status: AC
Start: 2022-05-04 — End: 2022-05-05
  Filled 2022-05-04: qty 2

## 2022-05-04 MED ORDER — SODIUM CHLORIDE 0.45 % IV BOLUS
500.0000 mL | Freq: Once | INTRAVENOUS | Status: AC
  Administered 2022-05-04: 500 mL via INTRAVENOUS

## 2022-05-04 MED ORDER — MAGNESIUM SULFATE 2 GM/50ML IV SOLN
2.0000 g | Freq: Once | INTRAVENOUS | Status: AC
  Administered 2022-05-04: 2 g via INTRAVENOUS
  Filled 2022-05-04: qty 50

## 2022-05-04 MED ORDER — OXYCODONE HCL 5 MG PO TABS
ORAL_TABLET | ORAL | Status: AC
  Filled 2022-05-04: qty 1

## 2022-05-04 MED ORDER — OXYCODONE HCL 5 MG PO TABS
5.0000 mg | ORAL_TABLET | Freq: Once | ORAL | Status: AC
  Administered 2022-05-04: 5 mg via ORAL

## 2022-05-04 MED ORDER — ALPRAZOLAM 0.5 MG PO TABS
ORAL_TABLET | ORAL | Status: AC
  Filled 2022-05-04: qty 2

## 2022-05-04 MED ORDER — POTASSIUM CHLORIDE CRYS ER 20 MEQ PO TBCR
40.0000 meq | EXTENDED_RELEASE_TABLET | Freq: Once | ORAL | Status: AC
  Administered 2022-05-04: 40 meq via ORAL
  Filled 2022-05-04: qty 2

## 2022-05-04 MED ORDER — SODIUM CHLORIDE 0.9 % IV BOLUS
500.0000 mL | Freq: Once | INTRAVENOUS | Status: AC
  Administered 2022-05-04: 500 mL via INTRAVENOUS

## 2022-05-04 MED ORDER — BUTALBITAL-APAP-CAFFEINE 50-325-40 MG PO TABS
1.0000 | ORAL_TABLET | Freq: Two times a day (BID) | ORAL | Status: DC | PRN
  Administered 2022-05-04 – 2022-05-06 (×4): 2 via ORAL
  Administered 2022-05-06 – 2022-05-07 (×3): 1 via ORAL
  Administered 2022-05-07: 2 via ORAL
  Administered 2022-05-08: 1 via ORAL
  Administered 2022-05-08 – 2022-05-09 (×2): 2 via ORAL
  Filled 2022-05-04 (×4): qty 2
  Filled 2022-05-04 (×5): qty 1
  Filled 2022-05-04: qty 2
  Filled 2022-05-04: qty 1
  Filled 2022-05-04: qty 2

## 2022-05-04 MED ORDER — ALPRAZOLAM 0.5 MG PO TABS
1.0000 mg | ORAL_TABLET | Freq: Three times a day (TID) | ORAL | Status: DC | PRN
  Administered 2022-05-04 – 2022-05-09 (×13): 1 mg via ORAL
  Filled 2022-05-04 (×12): qty 2

## 2022-05-04 MED ORDER — OXYCODONE HCL 5 MG PO TABS
10.0000 mg | ORAL_TABLET | Freq: Four times a day (QID) | ORAL | Status: DC | PRN
  Administered 2022-05-04 – 2022-05-09 (×16): 10 mg via ORAL
  Filled 2022-05-04 (×16): qty 2

## 2022-05-04 NOTE — ED Notes (Signed)
Update provided to Pt's daughter.

## 2022-05-04 NOTE — ED Notes (Signed)
Pt reports she was able to get around at rehab w/ a special walker, but she was not sent home w/ the same equipment.  Sts home PT was supposed to come to her home today for an assessment.  Sts her goal is to go home and live independently.   ?

## 2022-05-04 NOTE — ED Notes (Signed)
Pt reports she was in rehab until Friday and she was sent home.  Pt lives alone.  Sts she is unable to ambulate around her home.  Sts a home health RN came to her home x2 days ago, but has not come back since.   ? ?EMS reported to ED that Pt was "covered in urine and stool."   ?

## 2022-05-04 NOTE — ED Notes (Signed)
Xray and phlebotomy at bedside 

## 2022-05-04 NOTE — ED Notes (Signed)
Pt provided cranberry juice and warm blankets.  ?

## 2022-05-04 NOTE — ED Provider Notes (Signed)
?[facility] ?Provider Note ? ? ?CSN: [id number] ?Arrival date & time: 05/04/22  1050 ? ?  ? ?History ? ?Chief Complaint  ?Patient presents with  ? Weakness  ? ? ?MAISHA KAPPUS is a 66 y.o. female.  She has a history of alcohol abuse.  She is here by ambulance from home with complaint of feeling generally weak, having not eaten or drank anything for the last few days.  She said she has not been able to eat or drink because she cannot ambulate secondary to bilateral knee pain.  She said she was in rehabilitation and was minimally ambulatory on discharge.  Has been on the couch since returning home, covered in stool and urine.  Denies any chest pain cough shortness of breath abdominal pain vomiting or urinary symptoms.  No fevers or chills.  Is hungry and asking for something to eat.  Denies any recent alcohol use or drug use. ? ?The history is provided by the patient and the EMS personnel.  ?Weakness ?Severity:  Severe ?Onset quality:  Gradual ?Duration:  5 days ?Timing:  Constant ?Progression:  Unchanged ?Chronicity:  New ?Relieved by:  Nothing ?Worsened by:  Activity ?Ineffective treatments:  None tried ?Associated symptoms: difficulty walking and falls   ?Associated symptoms: no abdominal pain, no chest pain, no cough, no dysuria, no fever, no foul-smelling urine, no headaches, no nausea, no shortness of breath and no vomiting   ? ?  ? ?Home Medications ?Prior to Admission medications   ?Medication Sig Start Date End Date Taking? Authorizing Provider  ?butalbital-acetaminophen-caffeine (FIORICET) 50-325-40 MG tablet Take 1-2 tablets by mouth 2 (two) times daily as needed for headache.    [provider]  ?oxyCODONE (OXY IR/ROXICODONE) 5 MG immediate release tablet Take 10 mg by mouth every 6 (six) hours as needed for severe pain.    [provider]  ?oxyCODONE (ROXICODONE) 5 MG immediate release tablet Take 2 tablets (10 mg total) by mouth every 6 (six) hours as needed for  severe pain. 04/11/22   Vanetta Mulders, MD  ?pantoprazole (PROTONIX) 40 MG tablet Take 40 mg by mouth daily. 03/17/22   [provider]  ?potassium chloride (KLOR-CON) 10 MEQ tablet Take 1 tablet (10 mEq total) by mouth 2 (two) times daily for 7 days. ?Patient not taking: Reported on 04/06/2022 05/19/21 04/06/22  Linwood Dibbles, MD  ?traZODone (DESYREL) 100 MG tablet Take 100 mg by mouth at bedtime. 03/17/22   [provider]  ?   ? ?Allergies    ?Amoxicillin-pot clavulanate, Azithromycin, Bupropion, Nsaids, Prednisone, and Vilazodone   ? ?Review of Systems   ?Review of Systems  ?Constitutional:  Negative for fever.  ?HENT:  Negative for sore throat.   ?Respiratory:  Negative for cough and shortness of breath.   ?Cardiovascular:  Negative for chest pain.  ?Gastrointestinal:  Negative for abdominal pain, nausea and vomiting.  ?Genitourinary:  Negative for dysuria.  ?Musculoskeletal:  Positive for falls and gait problem.  ?Skin:  Negative for rash.  ?Neurological:  Positive for weakness. Negative for headaches.  ? ?Physical Exam ?Updated Vital Signs ?BP 138/85 (BP Location: Left Arm)   Pulse 77   Temp 97.6 ?F (36.4 ?C) (Oral)   Resp 16   Ht 5\' 5"  (1.651 m)   Wt 54.4 kg   SpO2 100%   BMI 19.97 kg/m?  ?Physical Exam ?Vitals and nursing note reviewed.  ?Constitutional:   ?   General: She is not in acute distress. ?  Appearance: Normal appearance. She is well-developed.  ?HENT:  ?   Head: Normocephalic and atraumatic.  ?Eyes:  ?   Conjunctiva/sclera: Conjunctivae normal.  ?Cardiovascular:  ?   Rate and Rhythm: Normal rate and regular rhythm.  ?   Heart sounds: No murmur heard. ?Pulmonary:  ?   Effort: Pulmonary effort is normal. No respiratory distress.  ?   Breath sounds: Normal breath sounds.  ?Abdominal:  ?   Palpations: Abdomen is soft.  ?   Tenderness: There is no abdominal tenderness.  ?Musculoskeletal:     ?   General: No swelling. Normal range of motion.  ?   Cervical back: Neck supple.  ?    Right lower leg: No edema.  ?   Left lower leg: No edema.  ?Skin: ?   General: Skin is warm and dry.  ?   Capillary Refill: Capillary refill takes less than 2 seconds.  ?Neurological:  ?   General: No focal deficit present.  ?   Mental Status: She is alert.  ? ? ?ED Results / Procedures / Treatments   ?Labs ?(all labs ordered are listed, but only abnormal results are displayed) ?Labs Reviewed  ?COMPREHENSIVE METABOLIC PANEL - Abnormal; Notable for the following components:  ?    Result Value  ? Potassium 3.0 (*)   ? CO2 18 (*)   ? Glucose, Bld 64 (*)   ? Albumin 3.4 (*)   ? Alkaline Phosphatase 184 (*)   ? All other components within normal limits  ?URINALYSIS, ROUTINE W REFLEX MICROSCOPIC - Abnormal; Notable for the following components:  ? Hgb urine dipstick SMALL (*)   ? Ketones, ur 20 (*)   ? Leukocytes,Ua TRACE (*)   ? Bacteria, UA RARE (*)   ? All other components within normal limits  ?RAPID URINE DRUG SCREEN, HOSP PERFORMED - Abnormal; Notable for the following components:  ? Benzodiazepines POSITIVE (*)   ? Barbiturates POSITIVE (*)   ? All other components within normal limits  ?CBG MONITORING, ED - Abnormal; Notable for the following components:  ? Glucose-Capillary 64 (*)   ? All other components within normal limits  ?RESP PANEL BY RT-PCR (FLU A&B, COVID) [id number]  ?CBC WITH DIFFERENTIAL/PLATELET  ?LIPASE, BLOOD  ?ETHANOL  ?MAGNESIUM  ? ? ?EKG ?EKG Interpretation ? ?Date/Time:  Wednesday May 04 2022 11:07:07 EDT ?Ventricular Rate:  77 ?PR Interval:  168 ?QRS Duration: 72 ?QT Interval:  403 ?QTC Calculation: 457 ?R Axis:   11 ?Text Interpretation: Sinus rhythm Abnormal R-wave progression, early transition No significant change since prior 4/23 Confirmed by Aletta Edouard [phone]) on 05/04/2022 11:18:01 AM ? ?Radiology ?DG Chest 1 View ? ?Result Date: 05/04/2022 ?CLINICAL DATA:  Weakness and fall. EXAM: CHEST  1 VIEW COMPARISON:  Chest radiograph 04/06/2022 and 05/19/2021 FINDINGS: Patchy linear densities in  the left lower lobe are suggestive for areas of atelectasis or scarring. No large areas of airspace disease. Negative for pneumothorax. Heart size is normal. Linear density overlying the right lower chest is probably related to overlying structure and external to the patient. IMPRESSION: 1. No acute cardiopulmonary disease. 2. Patchy densities at the left lung base are suggestive for scarring and/or atelectasis. Electronically Signed   By: Markus Daft M.D.   On: 05/04/2022 12:14  ? ?DG Knee Complete 4 Views Left ? ?Result Date: 05/04/2022 ?CLINICAL DATA:  Weakness and fell on right knee. EXAM: LEFT KNEE - COMPLETE 4+ VIEW COMPARISON:  Right knee 05/02/2021 FINDINGS: Again noted is extensive osteoarthritis  in the left knee with moderate to severe joint space narrowing in the lateral knee compartment with subchondral sclerosis. Extensive osteophytosis in the knee compartments. Negative for an acute fracture or dislocation. Lateral view is limited. Cannot exclude small suprapatellar joint effusion. IMPRESSION: Extensive osteoarthritis in left knee without acute bone abnormality. Electronically Signed   By: Markus Daft M.D.   On: 05/04/2022 12:17  ? ?DG Knee Complete 4 Views Right ? ?Result Date: 05/04/2022 ?CLINICAL DATA:  Weakness and fell on right knee.  Knee pain. EXAM: RIGHT KNEE - COMPLETE 4+ VIEW COMPARISON:  Right knee 03/26/2022 FINDINGS: Irregularity along the lateral right tibial plateau is similar to the prior examination. Negative for an acute fracture or dislocation. Stable osteophytosis in the knee compartments. No large joint effusion. Lateral view is limited. IMPRESSION: 1. Osteoarthritis in the right knee without acute bone abnormality. Electronically Signed   By: Markus Daft M.D.   On: 05/04/2022 12:20   ? ?Procedures ?Procedures  ? ? ?Medications Ordered in ED ?Medications  ?ALPRAZolam Duanne Moron) tablet 1 mg ( Oral Not Given 05/04/22 1501)  ?butalbital-acetaminophen-caffeine (FIORICET) 50-325-40 MG per tablet  1-2 tablet ( Oral Not Given 05/04/22 1502)  ?traZODone (DESYREL) tablet 100 mg (has no administration in time range)  ?potassium chloride SA (KLOR-CON M) CR tablet 40 mEq (40 mEq Oral Given 05/04/22 1316)  ?sodium chl

## 2022-05-04 NOTE — ED Notes (Signed)
CSW met with pt in room to speak about PT recommendation for SNF at D/C. Pt is hesitant but agreeable to go back to SNF. Pt states she wants to be able to go home but understands she needs to get stronger first. CSW explained that referral could be sent to [facility]. Pt is agreeable to this and would like to return there for SNF if possible. Referral has been sent. Insurance Josem Kaufmann will be started once all clinicals are completed in chart. TOC to follow.  ?

## 2022-05-04 NOTE — ED Triage Notes (Signed)
Patient via EMS for weakness. She was unable to get up from the couch this morning and has not been able to eat since Friday.  ?

## 2022-05-04 NOTE — Evaluation (Signed)
Physical Therapy Evaluation ?Patient Details ?Name: Jamie DenseLinda G [name] ?MRN: [id number] ?DOB: May 03, 1956 ?Today's Date: 05/04/2022 ? ?History of Present Illness ? Patient via EMS for weakness. She was unable to get up from the couch this morning and has not been able to eat since Friday ?  ?Clinical Impression ? Patient demonstrates need for min/mod assist with bed mobility in which patient has more difficulty with sit to supine due to generalized UE/LE weakness and poor trunk control during task. Patient is mod assist with sit to stand transfer with assistance of RW, in which patient is limited by fatigue, decreased LE/UE strength and poor standing balance and coordination needed to function independently during task. Patient is able to side step and forward/backward step during gait assessment but does so with labored movement and moderate verbal cueing resulting in minimal gait distance. Patient will benefit from continued skilled physical therapy in hospital and recommended venue below to increase strength, balance, endurance for safe ADLs and gait.  ?   ? ?Recommendations for follow up therapy are one component of a multi-disciplinary discharge planning process, led by the attending physician.  Recommendations may be updated based on patient status, additional functional criteria and insurance authorization. ? ?Follow Up Recommendations Skilled nursing-short term rehab (<3 hours/day) ? ?  ?Assistance Recommended at Discharge Intermittent Supervision/Assistance  ?Patient can return home with the following ? A lot of help with walking and/or transfers;A lot of help with bathing/dressing/bathroom;Assistance with cooking/housework;Help with stairs or ramp for entrance ? ?  ?Equipment Recommendations None recommended by PT  ?Recommendations for Other Services ?    ?  ?Functional Status Assessment Patient has had a recent decline in their functional status and demonstrates the ability to make significant improvements in  function in a reasonable and predictable amount of time.  ? ?  ?Precautions / Restrictions Precautions ?Precautions: Fall ?Precaution Comments: R wrist to elbow splint ?Restrictions ?Weight Bearing Restrictions: No  ? ?  ? ?Mobility ? Bed Mobility ?Overal bed mobility: Needs Assistance ?Bed Mobility: Supine to Sit, Sit to Supine ?  ?  ?Supine to sit: Min assist ?Sit to supine: Mod assist ?  ?General bed mobility comments: Patient demonstrates labored movement during bed mobility with min assist needed for supine to sit. Mod assist needed for sit to supine due to generalized UE/LE weakness with ability to move backwards into center of bed independently along with demonstrating decreased core strength with inability to shift weight in supine. ?  ? ?Transfers ?Overall transfer level: Needs assistance ?Equipment used: Rolling walker (2 wheels) ?Transfers: Sit to/from Stand ?Sit to Stand: Mod assist, Min assist ?  ?  ?  ?  ?  ?General transfer comment: Patient needs mod assist with sit to stand due to poor standing balance and generalized LE weakness in which patient struggles to maintain weight in upright position. Patient not able to fully extend LE's and is fatigued with UE assist with RW. ?  ? ?Ambulation/Gait ?Ambulation/Gait assistance: Mod assist ?Gait Distance (Feet): 5 Feet ?Assistive device: Rolling walker (2 wheels) ?Gait Pattern/deviations: Decreased step length - right, Decreased step length - left, Decreased stride length ?Gait velocity: decreased ?  ?  ?General Gait Details: limited to a few slow labored side steps and steps forward/backwards using left hand to hold onto RW due to fatigue and poor standing balance. Patient also demonstrates generalized LE weakness with decreased ability to weight shift and maintain single leg stance. ? ?Stairs ?  ?  ?  ?  ?  ? ?  Wheelchair Mobility ?  ? ?Modified Rankin (Stroke Patients Only) ?  ? ?  ? ?Balance Overall balance assessment: Needs assistance ?Sitting-balance  support: Single extremity supported, Feet supported ?Sitting balance-Leahy Scale: Fair ?Sitting balance - Comments: Patient able to sit EOB but demonstrates decreased trunk control and struggles to maintain upright posture ?  ?Standing balance support: During functional activity, Reliant on assistive device for balance, Single extremity supported ?Standing balance-Leahy Scale: Poor ?Standing balance comment: Patient struggles using UE to hold on to RW and rely on this for balance. Patient demonstrates moderate difficulty with weight shifting indicating poor balance and control in standing. ?  ?  ?  ?  ?  ?  ?  ?  ?  ?  ?  ?   ? ? ? ?Pertinent Vitals/Pain Pain Assessment ?Pain Assessment: Faces ?Faces Pain Scale: Hurts even more ?Pain Location: all over ?Pain Descriptors / Indicators: Grimacing, Guarding ?Pain Intervention(s): Limited activity within patient's tolerance, Monitored during session  ? ? ?Home Living Family/patient expects to be discharged to:: Private residence ?Living Arrangements: Alone ?Available Help at Discharge: Family;Available PRN/intermittently ?Type of Home: House ?Home Access: Stairs to enter ?Entrance Stairs-Rails: None ?Entrance Stairs-Number of Steps: 4 ?  ?Home Layout: One level ?Home Equipment: Agricultural consultant (2 wheels);Cane - quad;Grab bars - tub/shower;Grab bars - toilet ?   ?  ?Prior Function Prior Level of Function : Needs assist ?  ?  ?  ?Physical Assist : Mobility (physical);ADLs (physical) ?Mobility (physical): Transfers;Gait ?ADLs (physical): Dressing;Bathing;Toileting;IADLs ?Mobility Comments: Pt reports household ambulation with quad cane but recently pt has been in bed much of the time. ?ADLs Comments: Assisted by daughter for bathing, dressing and toileting. Pt reports more set up assist but also later reported need for physical assist to don socks. Assisted by family for IADL's. ?  ? ? ?Hand Dominance  ? Dominant Hand: Right ? ?  ?Extremity/Trunk Assessment  ? Upper  Extremity Assessment ?Upper Extremity Assessment: Generalized weakness ?RUE Deficits / Details: Pt reporting pain in R arm limiting shoulder motion to trace amounts. Cast/splint on R wrist to elbow. ?  ? ?Lower Extremity Assessment ?Lower Extremity Assessment: Generalized weakness ?  ? ?Cervical / Trunk Assessment ?Cervical / Trunk Assessment: Normal  ?Communication  ? Communication: No difficulties  ?Cognition Arousal/Alertness: Awake/alert ?Behavior During Therapy: [facility] for tasks assessed/performed ?Overall Cognitive Status: Within Functional Limits for tasks assessed ?  ?  ?  ?  ?  ?  ?  ?  ?  ?  ?  ?  ?  ?  ?  ?  ?  ?  ?  ? ?  ?General Comments   ? ?  ?Exercises    ? ?Assessment/Plan  ?  ?PT Assessment Patient needs continued PT services  ?PT Problem List Decreased strength;Decreased activity tolerance;Decreased balance;Decreased range of motion;Decreased mobility ? ?   ?  ?PT Treatment Interventions DME instruction;Gait training;Stair training;Functional mobility training;Therapeutic activities;Therapeutic exercise;Balance training;Patient/family education   ? ?PT Goals (Current goals can be found in the Care Plan section)  ?Acute Rehab PT Goals ?Patient Stated Goal: return home after rehab ?PT Goal Formulation: With patient ?Time For Goal Achievement: 05/18/22 ?Potential to Achieve Goals: Fair ? ?  ?Frequency Min 2X/week ?  ? ? ?Co-evaluation   ?  ?  ?  ?  ? ? ?  ?AM-PAC PT "6 Clicks" Mobility  ?Outcome Measure Help needed turning from your back to your side while in a flat bed without using bedrails?: A Little ?  Help needed moving from lying on your back to sitting on the side of a flat bed without using bedrails?: A Lot ?Help needed moving to and from a bed to a chair (including a wheelchair)?: A Lot ?Help needed standing up from a chair using your arms (e.g., wheelchair or bedside chair)?: A Lot ?Help needed to walk in hospital room?: A Lot ?Help needed climbing 3-5 steps with a railing? : A Lot ?6 Click  Score: 13 ? ?  ?End of Session   ?Activity Tolerance: Patient limited by pain;Patient limited by fatigue;Patient tolerated treatment well ?Patient left: in bed;with call bell/phone within reach ?Nurse Communication: Mo

## 2022-05-04 NOTE — ED Notes (Signed)
Pt continually asking for pain medication.  Will notify EDP. ? ?Pt provided graham crackers per request.  ?

## 2022-05-04 NOTE — [place] FL2 (Signed)
? MEDICAID FL2 LEVEL OF CARE SCREENING TOOL  ?  ? ?IDENTIFICATION  ?Patient Name: ?Jamie DenseLinda G Degracia Birthdate: 28-Feb-1956 Sex: female Admission Date (Current Location): ?05/04/2022  ?[place] ?  Facility and Address:  ?[facility],  [address], [place] [id number] ?     Provider Number: ?[id number]  ?Attending Physician Name and Address:  ?Default, Provider, MD ? Relative Name and Phone Number:  ?Thrasher,Chelsea (Daughter) [phone] ?   ?Current Level of Care: ?Hospital Recommended Level of Care: ?Skilled Nursing Facility Prior Approval Number: ?  ? ?Date Approved/Denied: ?  PASRR Number: ?[id number] A ? ?Discharge Plan: ?SNF ?  ? ?Current Diagnoses: ?Patient Active Problem List  ? Diagnosis Date Noted  ? Hypokalemia 04/06/2022  ? Falls frequently 04/06/2022  ? Alcohol abuse 04/06/2022  ? Opiate dependence --Chronic pain 04/06/2022  ? Benzodiazepine dependence /Chronic anxiety 04/06/2022  ? Abdominal pain   ? Nausea vomiting and diarrhea 05/08/2016  ? Sepsis, unspecified organism (HCC) 05/08/2016  ? Migraine 05/08/2016  ? AKI (acute kidney injury) (HCC) 05/08/2016  ? 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 Anxiety 05/08/2016  ? Sepsis (HCC) 05/08/2016  ? ? ?Orientation RESPIRATION BLADDER Height & Weight   ?  ?Self, Time, Situation, Place ? Normal Continent Weight: 120 lb (54.4 kg) ?Height:  5\' 5"  (165.1 cm)  ?BEHAVIORAL SYMPTOMS/MOOD NEUROLOGICAL BOWEL NUTRITION STATUS  ?    Continent Diet (regular)  ?AMBULATORY STATUS COMMUNICATION OF NEEDS Skin   ?Extensive Assist Verbally Normal ?  ?  ?  ?    ?     ?     ? ? ?Personal Care Assistance Level of Assistance  ?Bathing, Feeding, Dressing Bathing Assistance: Limited assistance ?Feeding assistance: Independent ?Dressing Assistance: Limited assistance ?   ? ?Functional Limitations Info  ?Sight, Hearing, Speech Sight Info: Adequate ?Hearing Info: Adequate ?Speech Info: Adequate  ? ? ?SPECIAL CARE FACTORS FREQUENCY  ?PT (By  licensed PT), OT (By licensed OT)   ?  ?PT Frequency: 5 times weekly ?OT Frequency: 5 times weekly ?  ?  ?  ?   ? ? ?Contractures Contractures Info: Not present  ? ? ?Additional Factors Info  ?Code Status, Allergies Code Status Info: FULL ?Allergies Info: Amoxicillin-pot Clavulanate,Azithromycin,Bupropion,Nsaids,Prednisone,Vilazodone ?  ?  ?  ?   ? ?Current Medications (05/04/2022):  This is the current hospital active medication list ?Current Facility-Administered Medications  ?Medication Dose Route Frequency Provider Last Rate Last Admin  ? ALPRAZolam Prudy Feeler(XANAX) tablet 1 mg  1 mg Oral TID PRN Terrilee FilesButler, [name] C, MD   1 mg at 05/04/22 1419  ? butalbital-acetaminophen-caffeine (FIORICET) 50-325-40 MG per tablet 1-2 tablet  1-2 tablet Oral BID PRN Terrilee FilesButler, [name] C, MD   2 tablet at 05/04/22 1419  ? traZODone (DESYREL) tablet 100 mg  100 mg Oral QHS Terrilee FilesButler, [name] C, MD      ? ?Current Outpatient Medications  ?Medication Sig Dispense Refill  ? ALPRAZolam (XANAX) 1 MG tablet Take 1 mg by mouth 3 (three) times daily as needed for anxiety or sleep.    ? butalbital-acetaminophen-caffeine (FIORICET) 50-325-40 MG tablet Take 1-2 tablets by mouth 2 (two) times daily as needed for headache.    ? oxyCODONE (ROXICODONE) 5 MG immediate release tablet Take 2 tablets (10 mg total) by mouth every 6 (six) hours as needed for severe pain. 20 tablet 0  ? traZODone (DESYREL) 100 MG tablet Take 100 mg by mouth at bedtime.    ? potassium chloride (KLOR-CON) 10 MEQ  tablet Take 1 tablet (10 mEq total) by mouth 2 (two) times daily for 7 days. (Patient not taking: Reported on 04/06/2022) 14 tablet 0  ? ? ? ?Discharge Medications: ?Please see discharge summary for a list of discharge medications. ? ?Relevant Imaging Results: ? ?Relevant Lab Results: ? ? ?Additional Information ?SSN: [id number] ? ?Villa Herb, LCSWA ? ? ? ? ?

## 2022-05-04 NOTE — ED Notes (Signed)
Pyxis was in critical override when medications were pulled.  Unable to link overridden medications to 1419 administration.    ?

## 2022-05-04 NOTE — ED Notes (Addendum)
Per Pt's daughter, Pt chose to leave rehab facility early and is unable to care for herself.  Home Health RN assessed Pt on Monday but "is unable to take her on due to care required."  APS has been involved.     ? ?Daughter reports Pt has addiction issues and has run out of her pain medication.   ?

## 2022-05-04 NOTE — Consult Note (Signed)
Initial Consultation Note ? ? ?Patient: Jamie [name] [id number] DOB: 12/23/1956 PCP: Christain Sacramento, MD ?DOA: 05/04/2022 ?DOS: the patient was seen and examined on 05/04/2022 ?Primary service: Default, Provider, MD ? ?Referring physician: Dr. Melina Copa, [place] ?Reason for consult: Generalized weakness, inability to walk ? ?Assessment/Plan: ?Assessment and Plan: ? ?Generalized weakness ?-Suspect related to deconditioning ?-Seen by PT with recommendations for skilled nursing facility placement ?-TOC following for placement needs ? ?Hypokalemia/hypomagnesemia ?-Likely related to decreased p.o. intake over the past few days ?-No vomiting or diarrhea ?-This is being replaced ? ?Hypoglycemia ?-Likely related to decreased p.o. intake since she was unable to get up to eat food ?-She has tolerated diet here in the hospital ?-Repeat CBG has been ordered ?-Clinically she does not appear to be symptomatic at this time ? ?Anxiety ?-Continue on home dose of Xanax ? ?Bilateral knee osteoarthritis/chronic pain ?-Continue home dose of oxycodone ? ?History of migraines ?-Continue on home dose of Fioricet ? ? ?Based on patient's current presentation and lab findings, does not appear that she would meet criteria for inpatient admission.  Certainly, if there is any change in her condition, the hospitalist service can be reconsulted to reevaluate patient for admission.  At this point, it appears that her main need is around skilled nursing facility placement. ? ?HPI: Jamie [name] is a 66 y.o. female with past medical history of anxiety, chronic pain from arthritis, migraine headaches, who was recently released from skilled nursing facility, was at home and reports that she was unable to ambulate.  She was subsequently not eating and drinking since she was not able to get up and get anything to [place].  She became increasingly weak.  She was brought to the hospital for evaluation where she was noted to have mild hypokalemia.  Urinalysis  without any signs of infection.  Imaging of chest and bilateral knees also without acute findings.  Since she was unable to ambulate, hospital service was requested to evaluate for possible admission. ? ?Review of Systems: As mentioned in the history of present illness. All other systems reviewed and are negative. ?Past Medical History:  ?Diagnosis Date  ? Anxiety   ? Arthritis   ? Chronic pain   ? Gastroenteritis   ? Headache   ? Hypotension   ? Insomnia   ? Suicidal ideation   ? ?Past Surgical History:  ?Procedure Laterality Date  ? BACK SURGERY    ? CARPAL TUNNEL RELEASE    ? LAPAROSCOPIC OOPHERECTOMY    ? MOUTH SURGERY    ? ?Social History:  reports that she has never smoked. She has never used smokeless tobacco. She reports that she does not currently use alcohol. She reports that she does not use drugs. ? ?Allergies  ?Allergen Reactions  ? Amoxicillin-Pot Clavulanate Nausea And Vomiting  ?  Can tolerate plain Amoxicillin  ? Azithromycin Nausea And Vomiting  ?  Other reaction(s): Other (See Comments) ?unknown  ? Bupropion Nausea And Vomiting  ? Nsaids Other (See Comments)  ?  Stomach problems  ? Prednisone Diarrhea  ? Vilazodone Diarrhea  ? ? ?Family History  ?Problem Relation Age of Onset  ? Colon cancer Neg Hx   ? ? ?Prior to Admission medications   ?Medication Sig Start Date End Date Taking? Authorizing Provider  ?ALPRAZolam (XANAX) 1 MG tablet Take 1 mg by mouth 3 (three) times daily as needed for anxiety or sleep. 04/29/22  Yes [provider]  ?butalbital-acetaminophen-caffeine (FIORICET) 50-325-40 MG tablet Take 1-2 tablets  by mouth 2 (two) times daily as needed for headache.   Yes [provider]  ?oxyCODONE (ROXICODONE) 5 MG immediate release tablet Take 2 tablets (10 mg total) by mouth every 6 (six) hours as needed for severe pain. 04/11/22  Yes Fredia Sorrow, MD  ?traZODone (DESYREL) 100 MG tablet Take 100 mg by mouth at bedtime. 03/17/22  Yes [provider]  ?potassium  chloride (KLOR-CON) 10 MEQ tablet Take 1 tablet (10 mEq total) by mouth 2 (two) times daily for 7 days. ?Patient not taking: Reported on 04/06/2022 05/19/21 04/06/22  Dorie Rank, MD  ? ? ?Physical Exam: ?Vitals:  ? 05/04/22 1400 05/04/22 1510 05/04/22 1600 05/04/22 1700  ?BP: (!) 126/94 131/86 139/70 133/80  ?Pulse: 81 87 76 69  ?Resp: 13 13 15  (!) 22  ?Temp:      ?TempSrc:      ?SpO2: 99% 100% 100% 98%  ?Weight:      ?Height:      ? ?General exam: Alert, awake, oriented x 3 ?Respiratory system: Clear to auscultation. Respiratory effort normal. ?Cardiovascular system:RRR. No murmurs, rubs, gallops. ?Gastrointestinal system: Abdomen is nondistended, soft and nontender. No organomegaly or masses felt. Normal bowel sounds heard. ?Central nervous system: Alert and oriented. No focal neurological deficits. ?Extremities: No C/C/E, +pedal pulses ?Skin: No rashes, lesions or ulcers ?Psychiatry: Judgement and insight appear normal. Mood & affect appropriate.  ? ?Data Reviewed:  ? ?Urine tox inappropriately positive for benzodiazepine and barbiturates.  Urinalysis without any signs of infection.  Chest x-ray and bilateral x-rays of knees without acute findings.  Potassium 3.0.  Glucose 64.   ? ? ?Family Communication: No family present ? ?Thank you very much for involving [place] in the care of your patient. ? ?Author: ?Kathie Dike, MD ?05/04/2022 7:00 PM ? ?For on call review [email or web address].  ?

## 2022-05-04 NOTE — Plan of Care (Signed)
?  Problem: Acute Rehab PT Goals(only PT should resolve) ?Goal: Pt Will Go Supine/Side To Sit ?Outcome: Progressing ?Flowsheets (Taken 05/04/2022 1544) ?Pt will go Supine/Side to Sit: ? with minimal assist ? with min guard assist ?Goal: Pt Will Go Sit To Supine/Side ?Outcome: Progressing ?Flowsheets (Taken 05/04/2022 1544) ?Pt will go Sit to Supine/Side: ? with min guard assist ? with minimal assist ?Goal: Patient Will Transfer Sit To/From Stand ?Outcome: Progressing ?Flowsheets (Taken 05/04/2022 1544) ?Patient will transfer sit to/from stand: ? with min guard assist ? with minimal assist ?Goal: Pt Will Transfer Bed To Chair/Chair To Bed ?Outcome: Progressing ?Flowsheets (Taken 05/04/2022 1544) ?Pt will Transfer Bed to Chair/Chair to Bed: ? with min assist ? with mod assist ?Goal: Pt Will Perform Standing Balance Or Pre-Gait ?Outcome: Progressing ?Flowsheets (Taken 05/04/2022 1544) ?Pt will perform standing balance or pre-gait: ? with minimal assist ? with min guard assist ?Goal: Pt Will Ambulate ?Outcome: Progressing ?Flowsheets (Taken 05/04/2022 1544) ?Pt will Ambulate: ? 25 feet ? with rolling walker ? with minimal assist ? with moderate assist ? ?3:45 PM, 05/04/22 ?Arther Abbott, S/PT  ?  ?

## 2022-05-05 LAB — URINALYSIS, ROUTINE W REFLEX MICROSCOPIC
Bilirubin Urine: NEGATIVE
Bilirubin Urine: NEGATIVE
Glucose, UA: NEGATIVE mg/dL
Glucose, UA: NEGATIVE mg/dL
Ketones, ur: NEGATIVE mg/dL
Ketones, ur: NEGATIVE mg/dL
Leukocytes,Ua: NEGATIVE
Leukocytes,Ua: NEGATIVE
Nitrite: NEGATIVE
Nitrite: NEGATIVE
Protein, ur: NEGATIVE mg/dL
Protein, ur: NEGATIVE mg/dL
Specific Gravity, Urine: 1.011 (ref 1.005–1.030)
Specific Gravity, Urine: 1.011 (ref 1.005–1.030)
pH: 5 (ref 5.0–8.0)
pH: 5 (ref 5.0–8.0)

## 2022-05-05 LAB — CBG MONITORING, ED: Glucose-Capillary: 110 mg/dL — ABNORMAL HIGH (ref 70–99)

## 2022-05-05 NOTE — ED Notes (Signed)
Meal given to pt. Nurse notified ?

## 2022-05-05 NOTE — ED Notes (Signed)
Changed pts purewick canister. Canister had in it. New canister placed. Nurse notified ?

## 2022-05-05 NOTE — ED Notes (Signed)
CSW updated that pts insurance Josem Kaufmann has been approved at this time. CSW spoke to [place] with [facility] who states they do not have a bed today but can accept pt tomorrow for admission. TOC to follow up and get bed number in the morning. TOC to follow.  ?

## 2022-05-06 NOTE — ED Notes (Signed)
Pt given meal tray. Nurse notified ?

## 2022-05-06 NOTE — ED Notes (Signed)
Pt requesting to get back in bed from the recliner. PT had assisted pt to recliner earlier and then she ate breakfast. Pt used walker and stand-by nursing assist to transfer to bed. Pt c/o bilateral knee pain during transfer, but was able to successfully transfer with only stand-by assist.  ?

## 2022-05-06 NOTE — ED Notes (Addendum)
CSW spoke to [place] with [facility] who states that she will reach back out to update if they have a bed open later this afternoon. TOC to follow.  ? ?Addendum 3pm: CSW spoke with Debbie who states that they should be able to take pt for admission tomorrow. CSW to leave note for weekend Child psychotherapist.  ?

## 2022-05-06 NOTE — ED Notes (Signed)
Mepilex placed on pt's buttocks.  ?

## 2022-05-06 NOTE — Progress Notes (Signed)
Physical Therapy Treatment ?Patient Details ?Name: Jamie [name] ?MRN: [id number] ?DOB: 05-16-1956 ?Today's Date: 05/06/2022 ? ? ?History of Present Illness Patient via EMS for weakness. She was unable to get up from the couch this morning and has not been able to eat since Friday ? ?  ?PT Comments  ? ? Patient has demonstrated improved ability to perform bed mobility with modified independence with requiring increased time and supervision to perform task compared to initial evaluation. Patient was able to successfully perform bed to chair transfer with RW, but still demonstrates strength and balance deficits requiring min/mod assist from PT. Patient able to tolerate and perform pre-gait exercises in chair with fatigue and bilateral knee pain limiting participation. Patient will benefit from continued skilled physical therapy in hospital and recommended venue below to increase strength, balance, endurance for safe ADLs and gait.  ?  ?Recommendations for follow up therapy are one component of a multi-disciplinary discharge planning process, led by the attending physician.  Recommendations may be updated based on patient status, additional functional criteria and insurance authorization. ? ?Follow Up Recommendations ? Skilled nursing-short term rehab (<3 hours/day) ?  ?  ?Assistance Recommended at Discharge Intermittent Supervision/Assistance  ?Patient can return home with the following A lot of help with walking and/or transfers;A lot of help with bathing/dressing/bathroom;Assistance with cooking/housework;Help with stairs or ramp for entrance ?  ?Equipment Recommendations ? None recommended by PT  ?  ?Recommendations for Other Services   ? ? ?  ?Precautions / Restrictions Precautions ?Precautions: Fall ?Precaution Comments: R wrist to elbow splint ?Restrictions ?Weight Bearing Restrictions: No  ?  ? ?Mobility ? Bed Mobility ?Overal bed mobility: Modified Independent ?Bed Mobility: Supine to Sit ?  ?  ?Supine to sit:  Supervision, Modified independent (Device/Increase time) ?  ?  ?General bed mobility comments: Patient demonstrates improvement with bed mobility with requiring extended time to complete task but is able to do so without physical assist from PT. Patient still demonstrates generalized weakness and limitations with current splint/cast. ?  ? ?Transfers ?Overall transfer level: Needs assistance ?Equipment used: Rolling walker (2 wheels) ?Transfers: Sit to/from Stand, Bed to chair/wheelchair/BSC ?Sit to Stand: Min assist, Mod assist ?  ?Step pivot transfers: Mod assist, Min assist ?  ?  ?  ?General transfer comment: Patient has improved ability to place body in upright position during transfer but still demonstrates generalized weakness preventing patient from locking out arms/legs to fully extend and support herself. Patient is still min/mod assist with transfers but was able to particpate with bed to chair transfer during this session. ?  ? ?Ambulation/Gait ?Ambulation/Gait assistance: Mod assist ?Gait Distance (Feet): 5 Feet ?Assistive device: Rolling walker (2 wheels) ?Gait Pattern/deviations: Decreased step length - right, Decreased step length - left, Decreased stride length ?Gait velocity: decreased ?  ?  ?General Gait Details: Patient continues to be limited with amount of steps primarily due to fatigue, poor standing balance and limitations of R UE in cast/splint. ? ? ?Stairs ?  ?  ?  ?  ?  ? ? ?Wheelchair Mobility ?  ? ?Modified Rankin (Stroke Patients Only) ?  ? ? ?  ?Balance Overall balance assessment: Needs assistance ?Sitting-balance support: Single extremity supported, Feet supported ?Sitting balance-Leahy Scale: Fair ?Sitting balance - Comments: Patient able to sit EOB but demonstrates decreased trunk control and struggles to maintain upright posture ?  ?Standing balance support: During functional activity, Reliant on assistive device for balance, Single extremity supported ?Standing balance-Leahy Scale:  Poor ?Standing  balance comment: Patient continues to rely on RW for UE support during standing balance but struggles to do so with R UE cast/splint. Patient demonstrated better ability to weight shift but still lacks control in standing. ?  ?  ?  ?  ?  ?  ?  ?  ?  ?  ?  ?  ? ?  ?Cognition Arousal/Alertness: Awake/alert ?Behavior During Therapy: [facility] for tasks assessed/performed ?Overall Cognitive Status: Within Functional Limits for tasks assessed ?  ?  ?  ?  ?  ?  ?  ?  ?  ?  ?  ?  ?  ?  ?  ?  ?  ?  ?  ? ?  ?Exercises   ? ?  ?General Comments   ?  ?  ? ?Pertinent Vitals/Pain Pain Assessment ?Pain Assessment: Faces ?Faces Pain Scale: Hurts a little bit ?Pain Location: all over  ? ? ?Home Living   ?  ?  ?  ?  ?  ?  ?  ?  ?  ?   ?  ?Prior Function    ?  ?  ?   ? ?PT Goals (current goals can now be found in the care plan section) Acute Rehab PT Goals ?PT Goal Formulation: With patient ?Time For Goal Achievement: 05/18/22 ?Potential to Achieve Goals: Fair ?Progress towards PT goals: Progressing toward goals ? ?  ?Frequency ? ? ? Min 2X/week ? ? ? ?  ?PT Plan    ? ? ?Co-evaluation   ?  ?  ?  ?  ? ?  ?AM-PAC PT "6 Clicks" Mobility   ?Outcome Measure ? Help needed turning from your back to your side while in a flat bed without using bedrails?: A Little ?Help needed moving from lying on your back to sitting on the side of a flat bed without using bedrails?: A Lot ?Help needed moving to and from a bed to a chair (including a wheelchair)?: A Lot ?Help needed standing up from a chair using your arms (e.g., wheelchair or bedside chair)?: A Lot ?Help needed to walk in hospital room?: A Lot ?Help needed climbing 3-5 steps with a railing? : A Lot ?6 Click Score: 13 ? ?  ?End of Session   ?  ?  ?  ?PT Visit Diagnosis: Unsteadiness on feet (R26.81);Other abnormalities of gait and mobility (R26.89);Muscle weakness (generalized) (M62.81) ?  ? ? ?Time: [id number] ?PT Time Calculation (min) (ACUTE ONLY): 20 min ? ?Charges:  $Therapeutic  Exercise: 8-22 mins ?$Therapeutic Activity: 8-22 mins          ?          ? ?10:48 AM, 05/06/22 ?Arther AbbottNathaniel Zakrzewski, S/PT  ? ?

## 2022-05-07 NOTE — ED Notes (Signed)
Got pt up at meal time via walker helped her get in recliner for meal  ?

## 2022-05-07 NOTE — ED Notes (Signed)
Pt ate 100% of dinner tray

## 2022-05-07 NOTE — ED Notes (Signed)
Pt will not be be placed at SNF until Monday or, possibly, later.  ?

## 2022-05-07 NOTE — ED Notes (Signed)
Pt informed it is not time for pain meds yet  ?

## 2022-05-07 NOTE — Progress Notes (Addendum)
Debbie with [facility] indicates that due to renovations, she will not have a bed for patient until Monday. Patient and ED nursing secretary advised.  ? ?Hines Kloss, Juleen China, LCSW  ?

## 2022-05-07 NOTE — ED Provider Notes (Signed)
Emergency Medicine Observation Re-evaluation Note ? ?Jamie [name] is a 66 y.o. female, seen on rounds today.  Pt initially presented to the ED for complaints of Weakness ?Currently, the patient is waiting for placement. ? ?Physical Exam  ?BP 136/80   Pulse 71   Temp 97.6 ?F (36.4 ?C) (Oral)   Resp 12   Ht 1.651 m (5\' 5" )   Wt 54.4 kg   SpO2 95%   BMI 19.97 kg/m?  ?Physical Exam ?General: Resting ?Cardiac: Regular rate ? ?ED Course / MDM  ?EKG:EKG Interpretation ? ?Date/Time:  Wednesday May 04 2022 11:07:07 EDT ?Ventricular Rate:  77 ?PR Interval:  168 ?QRS Duration: 72 ?QT Interval:  403 ?QTC Calculation: 457 ?R Axis:   11 ?Text Interpretation: Sinus rhythm Abnormal R-wave progression, early transition No significant change since prior 4/23 Confirmed by Aletta Edouard [phone]) on 05/04/2022 11:18:01 AM ? ?I have reviewed the labs performed to date as well as medications administered while in observation.  Recent changes in the last 24 hours include no new studies or lab evaluation. ? ?Plan  ?Current plan is for skilled nursing placement. ? Jamie [name] is not under involuntary commitment. ? ? ?  ?Dorie Rank, MD ?05/07/22 [phone] ? ?

## 2022-05-08 NOTE — ED Notes (Addendum)
Pt rolled and cleaned brief and purwick replaced. Barrier cream applied to red and sore area on sacrum  ?

## 2022-05-08 NOTE — ED Notes (Signed)
Pt complaining of headache as well as pain all over her body and anxiety. Pt given PRN pain and anxiety medications  ?

## 2022-05-08 NOTE — ED Notes (Signed)
Pt asked that I cut up her pancakes. ?

## 2022-05-08 NOTE — ED Notes (Signed)
Pt complaining of increased pain and anxiety, pt given PRN medications. Pt provided lunch tray and assisted to a seat position to eat  ?

## 2022-05-09 NOTE — ED Notes (Signed)
CSW spoke with pt who states that she would like to be discharged home. CSW spoke with pt about PT recommending SNF still after seeing her this morning. Pt states that she feels better and that she can manage safely in the home. CSW informed pt that CSW can try to get [facility] services. Pt is agreeable to this and states that she again does not want to go to SNF and would like to return home. Pt states she will need EMS for transport. CSW updated MD and RN.  ? ?CSW spoke to [place] with [facility] who states that they can accept [facility] referral for [facility] PT/OT/Aide services. CSW requested that MD place [facility] orders. TOC signing off.  ?

## 2022-05-09 NOTE — ED Notes (Signed)
EMS called to transfer patient home for discharge  ?

## 2022-05-09 NOTE — Progress Notes (Signed)
Physical Therapy Treatment ?Patient Details ?Name: Jamie [name] ?MRN: [id number] ?DOB: 28-Jan-1956 ?Today's Date: 05/09/2022 ? ? ?History of Present Illness Patient via EMS for weakness. She was unable to get up from the couch this morning and has not been able to eat since Friday ? ?  ?PT Comments  ? ? Patient is able to perform bed mobility with modified independence with increased time being the primary limiting factor. The patient has demonstrated improvement with sit to stand and bed to chair transfers with needing min assist/min guard along with RW to complete task. Patient has shown ability to perform transfers with decreased reliance on UE via RW but still presents with strength and balance deficits that impact patient's ability to do this independently. Patient was able to tolerate pre-gait therapeutic exercises with decreased reports of fatigue and increased participation. Patient was able to ambulate for longer distances in room with RW and min assist/guard with decreased balance and fatigue primarily limiting patient's ability to ambulate further. Patient left in chair with nurse notified of mobility status. Patient will benefit from continued skilled physical therapy in hospital and recommended venue below to increase strength, balance, endurance for safe ADLs and gait.  ?  ?Recommendations for follow up therapy are one component of a multi-disciplinary discharge planning process, led by the attending physician.  Recommendations may be updated based on patient status, additional functional criteria and insurance authorization. ? ?Follow Up Recommendations ? Skilled nursing-short term rehab (<3 hours/day) ?  ?  ?Assistance Recommended at Discharge Intermittent Supervision/Assistance  ?Patient can return home with the following A lot of help with walking and/or transfers;A lot of help with bathing/dressing/bathroom;Assistance with cooking/housework;Help with stairs or ramp for entrance ?  ?Equipment  Recommendations ? None recommended by PT  ?  ?Recommendations for Other Services   ? ? ?  ?Precautions / Restrictions Precautions ?Precautions: Fall ?Precaution Comments: R wrist to elbow splint ?Restrictions ?Weight Bearing Restrictions: No  ?  ? ?Mobility ? Bed Mobility ?Overal bed mobility: Modified Independent ?Bed Mobility: Supine to Sit ?  ?  ?Supine to sit: Supervision, Modified independent (Device/Increase time) ?  ?  ?General bed mobility comments: Patient demonstrates improved ability to perform supine to sit with decreased time and proper form in which physical assist is not needed from PT. ?  ? ?Transfers ?Overall transfer level: Needs assistance ?Equipment used: Rolling walker (2 wheels) ?Transfers: Sit to/from Stand, Bed to chair/wheelchair/BSC ?Sit to Stand: Min assist, Min guard ?  ?Step pivot transfers: Min assist ?  ?  ?  ?General transfer comment: Patient demonstrates improvement with initiating sit to stand transfer with appropriate amount of strength with cueing needed to lock out arms and legs to complete task. Patient has improved balance ability with RW with mainly needing min assistance for standing balance during transfers. Patient demonstrates issues with confidence that impacts transfer ability but is able to do so with min assist from PT. ?  ? ?Ambulation/Gait ?Ambulation/Gait assistance: Min assist, Min guard ?Gait Distance (Feet): 12 Feet ?Assistive device: Rolling walker (2 wheels) ?Gait Pattern/deviations: Decreased step length - right, Decreased step length - left, Decreased stride length ?Gait velocity: decreased ?  ?  ?General Gait Details: Patient was able to tolerate more steps with forward walking in room but improved with ability of assistance in mainly needing min/contact guard assist. ? ? ?Stairs ?  ?  ?  ?  ?  ? ? ?Wheelchair Mobility ?  ? ?Modified Rankin (Stroke Patients Only) ?  ? ? ?  ?  Balance Overall balance assessment: Needs assistance ?Sitting-balance support: Single  extremity supported, Feet supported ?Sitting balance-Leahy Scale: Fair ?Sitting balance - Comments: Patient able to sit EOB but demonstrates decreased trunk control and struggles to maintain upright posture ?  ?Standing balance support: During functional activity, Reliant on assistive device for balance, Single extremity supported ?Standing balance-Leahy Scale: Poor ?Standing balance comment: Patient demonstrates minor improvement with standing balance with decrease of assistance needed but still demonstrates strength and balance deficits impacting ability to do so independently. ?  ?  ?  ?  ?  ?  ?  ?  ?  ?  ?  ?  ? ?  ?Cognition Arousal/Alertness: Awake/alert ?Behavior During Therapy: [facility] for tasks assessed/performed ?Overall Cognitive Status: Within Functional Limits for tasks assessed ?  ?  ?  ?  ?  ?  ?  ?  ?  ?  ?  ?  ?  ?  ?  ?  ?  ?  ?  ? ?  ?Exercises General Exercises - Lower Extremity ?Long Arc Quad: AROM, Strengthening, Both, 10 reps, Seated ?Hip Flexion/Marching: AROM, 10 reps, Strengthening, Both, Seated ?Toe Raises: AROM, Both, Strengthening, 15 reps, Seated ?Heel Raises: 15 reps, AROM, Strengthening, Both, Seated ? ?  ?General Comments   ?  ?  ? ?Pertinent Vitals/Pain Pain Assessment ?Pain Assessment: Faces ?Faces Pain Scale: Hurts a little bit ?Pain Descriptors / Indicators: Grimacing, Guarding ?Pain Intervention(s): Limited activity within patient's tolerance, Monitored during session, Repositioned  ? ? ?Home Living   ?  ?  ?  ?  ?  ?  ?  ?  ?  ?   ?  ?Prior Function    ?  ?  ?   ? ?PT Goals (current goals can now be found in the care plan section) Acute Rehab PT Goals ?Patient Stated Goal: return home after rehab ?PT Goal Formulation: With patient ?Time For Goal Achievement: 05/18/22 ?Potential to Achieve Goals: Fair ?Progress towards PT goals: Progressing toward goals ? ?  ?Frequency ? ? ? Min 2X/week ? ? ? ?  ?PT Plan    ? ? ?Co-evaluation   ?  ?  ?  ?  ? ?  ?AM-PAC PT "6 Clicks" Mobility    ?Outcome Measure ? Help needed turning from your back to your side while in a flat bed without using bedrails?: A Little ?Help needed moving from lying on your back to sitting on the side of a flat bed without using bedrails?: A Little ?Help needed moving to and from a bed to a chair (including a wheelchair)?: A Lot ?Help needed standing up from a chair using your arms (e.g., wheelchair or bedside chair)?: A Lot ?Help needed to walk in hospital room?: A Lot ?Help needed climbing 3-5 steps with a railing? : A Lot ?6 Click Score: 14 ? ?  ?End of Session   ?Activity Tolerance: Patient tolerated treatment well;No increased pain;Patient limited by fatigue ?Patient left: in chair;with call bell/phone within reach ?Nurse Communication: Mobility status ?PT Visit Diagnosis: Unsteadiness on feet (R26.81);Other abnormalities of gait and mobility (R26.89);Muscle weakness (generalized) (M62.81) ?  ? ? ?Time: [id number] ?PT Time Calculation (min) (ACUTE ONLY): 23 min ? ?Charges:  $Therapeutic Exercise: 8-22 mins ?$Therapeutic Activity: 8-22 mins          ?          ? ?{10:58 AM, 05/09/22 ?Arther AbbottNathaniel Zakrzewski, S/PT  ? ?

## 2022-05-09 NOTE — ED Notes (Signed)
Meal given to pt. Nurse notified ?

## 2022-05-09 NOTE — Evaluation (Deleted)
Physical Therapy Evaluation ?Patient Details ?Name: Jamie [name] ?MRN: [id number] ?DOB: 29-Jul-1956 ?Today's Date: 05/09/2022 ? ?History of Present Illness ? Patient via EMS for weakness. She was unable to get up from the couch this morning and has not been able to eat since Friday ?  ?Clinical Impression ? Patient is able to perform bed mobility with modified independence with increased time being the primary limiting factor. The patient has demonstrated improvement with sit to stand and bed to chair transfers with needing min assist/min guard along with RW to complete task. Patient has shown ability to perform transfers with decreased reliance on UE via RW but still presents with strength and balance deficits that impact patient's ability to do this independently. Patient was able to tolerate pre-gait therapeutic exercises with decreased reports of fatigue and increased participation. Patient was able to ambulate for longer distances in room with RW and min assist/guard with decreased balance and fatigue primarily limiting patient's ability to ambulate further. Patient left in chair with nurse notified of mobility status. Patient will benefit from continued skilled physical therapy in hospital and recommended venue below to increase strength, balance, endurance for safe ADLs and gait.  ?   ? ?Recommendations for follow up therapy are one component of a multi-disciplinary discharge planning process, led by the attending physician.  Recommendations may be updated based on patient status, additional functional criteria and insurance authorization. ? ?Follow Up Recommendations Skilled nursing-short term rehab (<3 hours/day) ? ?  ?Assistance Recommended at Discharge Intermittent Supervision/Assistance  ?Patient can return home with the following ? A lot of help with walking and/or transfers;A lot of help with bathing/dressing/bathroom;Assistance with cooking/housework;Help with stairs or ramp for entrance ? ?  ?Equipment  Recommendations None recommended by PT  ?Recommendations for Other Services ?    ?  ?Functional Status Assessment    ? ?  ?Precautions / Restrictions Precautions ?Precautions: Fall ?Precaution Comments: R wrist to elbow splint ?Restrictions ?Weight Bearing Restrictions: No  ? ?  ? ?Mobility ? Bed Mobility ?Overal bed mobility: Modified Independent ?Bed Mobility: Supine to Sit ?  ?  ?Supine to sit: Supervision, Modified independent (Device/Increase time) ?  ?  ?General bed mobility comments: Patient demonstrates improved ability to perform supine to sit with decreased time and proper form in which physical assist is not needed from PT. ?  ? ?Transfers ?Overall transfer level: Needs assistance ?Equipment used: Rolling walker (2 wheels) ?Transfers: Sit to/from Stand, Bed to chair/wheelchair/BSC ?Sit to Stand: Min assist, Min guard ?  ?Step pivot transfers: Min assist ?  ?  ?  ?General transfer comment: Patient demonstrates improvement with initiating sit to stand transfer with appropriate amount of strength with cueing needed to lock out arms and legs to complete task. Patient has improved balance ability with RW with mainly needing min assistance for standing balance during transfers. Patient demonstrates issues with confidence that impacts transfer ability but is able to do so with min assist from PT. ?  ? ?Ambulation/Gait ?Ambulation/Gait assistance: Min assist, Min guard ?Gait Distance (Feet): 12 Feet ?Assistive device: Rolling walker (2 wheels) ?Gait Pattern/deviations: Decreased step length - right, Decreased step length - left, Decreased stride length ?Gait velocity: decreased ?  ?  ?General Gait Details: Patient was able to tolerate more steps with forward walking in room but improved with ability of assistance in mainly needing min/contact guard assist. ? ?Stairs ?  ?  ?  ?  ?  ? ?Wheelchair Mobility ?  ? ?Modified Rankin (Stroke  Patients Only) ?  ? ?  ? ?Balance Overall balance assessment: Needs  assistance ?Sitting-balance support: Single extremity supported, Feet supported ?Sitting balance-Leahy Scale: Fair ?Sitting balance - Comments: Patient able to sit EOB but demonstrates decreased trunk control and struggles to maintain upright posture ?  ?Standing balance support: During functional activity, Reliant on assistive device for balance, Single extremity supported ?Standing balance-Leahy Scale: Poor ?Standing balance comment: Patient demonstrates minor improvement with standing balance with decrease of assistance needed but still demonstrates strength and balance deficits impacting ability to do so independently. ?  ?  ?  ?  ?  ?  ?  ?  ?  ?  ?  ?   ? ? ? ?Pertinent Vitals/Pain Pain Assessment ?Pain Assessment: Faces ?Faces Pain Scale: Hurts a little bit ?Pain Descriptors / Indicators: Grimacing, Guarding ?Pain Intervention(s): Limited activity within patient's tolerance, Monitored during session, Repositioned  ? ? ?Home Living   ?  ?  ?  ?  ?  ?  ?  ?  ?  ?   ?  ?Prior Function   ?  ?  ?  ?  ?  ?  ?  ?  ?  ? ? ?Hand Dominance  ?   ? ?  ?Extremity/Trunk Assessment  ?   ?  ? ?  ?  ? ?   ?Communication  ?    ?Cognition Arousal/Alertness: Awake/alert ?Behavior During Therapy: [facility] for tasks assessed/performed ?Overall Cognitive Status: Within Functional Limits for tasks assessed ?  ?  ?  ?  ?  ?  ?  ?  ?  ?  ?  ?  ?  ?  ?  ?  ?  ?  ?  ? ?  ?General Comments   ? ?  ?Exercises General Exercises - Lower Extremity ?Long Arc Quad: AROM, Strengthening, Both, 10 reps, Seated ?Hip Flexion/Marching: AROM, 10 reps, Strengthening, Both, Seated ?Toe Raises: AROM, Both, Strengthening, 15 reps, Seated ?Heel Raises: 15 reps, AROM, Strengthening, Both, Seated  ? ?Assessment/Plan  ?  ?PT Assessment    ?PT Problem List   ? ?   ?  ?PT Treatment Interventions     ? ?PT Goals (Current goals can be found in the Care Plan section)  ?Acute Rehab PT Goals ?Patient Stated Goal: return home after rehab ?PT Goal Formulation: With  patient ?Time For Goal Achievement: 05/18/22 ?Potential to Achieve Goals: Fair ? ?  ?Frequency Min 2X/week ?  ? ? ?Co-evaluation   ?  ?  ?  ?  ? ? ?  ?AM-PAC PT "6 Clicks" Mobility  ?Outcome Measure Help needed turning from your back to your side while in a flat bed without using bedrails?: A Little ?Help needed moving from lying on your back to sitting on the side of a flat bed without using bedrails?: A Little ?Help needed moving to and from a bed to a chair (including a wheelchair)?: A Lot ?Help needed standing up from a chair using your arms (e.g., wheelchair or bedside chair)?: A Lot ?Help needed to walk in hospital room?: A Lot ?Help needed climbing 3-5 steps with a railing? : A Lot ?6 Click Score: 14 ? ?  ?End of Session   ?Activity Tolerance: Patient tolerated treatment well;No increased pain;Patient limited by fatigue ?Patient left: in chair;with call bell/phone within reach ?Nurse Communication: Mobility status ?PT Visit Diagnosis: Unsteadiness on feet (R26.81);Other abnormalities of gait and mobility (R26.89);Muscle weakness (generalized) (M62.81) ?  ? ?Time: 1610-9604 ?PT  Time Calculation (min) (ACUTE ONLY): 23 min ? ? ?Charges:     ?PT Treatments ?$Therapeutic Exercise: 8-22 mins ?$Therapeutic Activity: 8-22 mins ?  ?   ? ? ?10:25 AM, 05/09/22 ?Arther Abbott, S/PT  ? ?

## 2022-05-09 NOTE — ED Notes (Signed)
Brief and purewick changed. ?

## 2022-05-09 NOTE — Discharge Instructions (Signed)
Follow-up with your family doctor 

## 2022-05-09 NOTE — ED Notes (Addendum)
Pt called out for brief needing to be checked. Pt cleaned and barrier cream applied to pts bottom. Grape juice given to pt, pt asking for pain meds. Nurse notified. ?

## 2022-05-13 ENCOUNTER — Other Ambulatory Visit: Payer: Self-pay

## 2022-05-13 ENCOUNTER — Emergency Department (HOSPITAL_COMMUNITY): Payer: Medicare HMO

## 2022-05-13 ENCOUNTER — Encounter (HOSPITAL_COMMUNITY): Payer: Self-pay | Admitting: *Deleted

## 2022-05-13 ENCOUNTER — Emergency Department (HOSPITAL_COMMUNITY)
Admission: EM | Admit: 2022-05-13 | Discharge: 2022-05-17 | Disposition: A | Discharge status: Home / Self Care | Payer: Medicare HMO | Attending: Emergency Medicine | Admitting: Emergency Medicine

## 2022-05-13 DIAGNOSIS — R627 Adult failure to thrive: Secondary | ICD-10-CM

## 2022-05-13 DIAGNOSIS — R531 Weakness: Secondary | ICD-10-CM | POA: Insufficient documentation

## 2022-05-13 DIAGNOSIS — R296 Repeated falls: Secondary | ICD-10-CM | POA: Insufficient documentation

## 2022-05-13 LAB — CBC WITH DIFFERENTIAL/PLATELET
Abs Immature Granulocytes: 0.05 10*3/uL (ref 0.00–0.07)
Basophils Absolute: 0.1 10*3/uL (ref 0.0–0.1)
Basophils Relative: 1 %
Eosinophils Absolute: 0.2 10*3/uL (ref 0.0–0.5)
Eosinophils Relative: 2 %
HCT: 37 % (ref 36.0–46.0)
Hemoglobin: 11.8 g/dL — ABNORMAL LOW (ref 12.0–15.0)
Immature Granulocytes: 1 %
Lymphocytes Relative: 24 %
Lymphs Abs: 2.1 10*3/uL (ref 0.7–4.0)
MCH: 29.8 pg (ref 26.0–34.0)
MCHC: 31.9 g/dL (ref 30.0–36.0)
MCV: 93.4 fL (ref 80.0–100.0)
Monocytes Absolute: 0.5 10*3/uL (ref 0.1–1.0)
Monocytes Relative: 6 %
Neutro Abs: 5.9 10*3/uL (ref 1.7–7.7)
Neutrophils Relative %: 66 %
Platelets: 348 10*3/uL (ref 150–400)
RBC: 3.96 MIL/uL (ref 3.87–5.11)
RDW: 13.9 % (ref 11.5–15.5)
WBC: 8.8 10*3/uL (ref 4.0–10.5)
nRBC: 0 % (ref 0.0–0.2)

## 2022-05-13 LAB — COMPREHENSIVE METABOLIC PANEL
ALT: 68 U/L — ABNORMAL HIGH (ref 0–44)
AST: 23 U/L (ref 15–41)
Albumin: 3.2 g/dL — ABNORMAL LOW (ref 3.5–5.0)
Alkaline Phosphatase: 228 U/L — ABNORMAL HIGH (ref 38–126)
Anion gap: 6 (ref 5–15)
BUN: 16 mg/dL (ref 8–23)
CO2: 25 mmol/L (ref 22–32)
Calcium: 9.4 mg/dL (ref 8.9–10.3)
Chloride: 106 mmol/L (ref 98–111)
Creatinine, Ser: 0.54 mg/dL (ref 0.44–1.00)
GFR, Estimated: >60 mL/min (ref >60)
Glucose, Bld: 100 mg/dL — ABNORMAL HIGH (ref 70–99)
Potassium: 4.3 mmol/L (ref 3.5–5.1)
Sodium: 137 mmol/L (ref 135–145)
Total Bilirubin: 0.1 mg/dL — ABNORMAL LOW (ref 0.3–1.2)
Total Protein: 6.3 g/dL — ABNORMAL LOW (ref 6.5–8.1)

## 2022-05-13 LAB — LIPASE, BLOOD: Lipase: 25 U/L (ref 11–51)

## 2022-05-13 MED ORDER — SODIUM CHLORIDE 0.9 % IV SOLN
INTRAVENOUS | Status: DC
  Administered 2022-05-13: 1000 mL via INTRAVENOUS

## 2022-05-13 MED ORDER — TRAZODONE HCL 50 MG PO TABS
100.0000 mg | ORAL_TABLET | Freq: Every day | ORAL | Status: DC
Start: 2022-05-13 — End: 2022-05-17
  Administered 2022-05-13 – 2022-05-16 (×4): 100 mg via ORAL
  Filled 2022-05-13 (×4): qty 2

## 2022-05-13 MED ORDER — OXYCODONE HCL 5 MG PO TABS
10.0000 mg | ORAL_TABLET | Freq: Four times a day (QID) | ORAL | Status: DC | PRN
  Administered 2022-05-13 – 2022-05-17 (×13): 10 mg via ORAL
  Filled 2022-05-13 (×14): qty 2

## 2022-05-13 MED ORDER — ALPRAZOLAM 0.5 MG PO TABS
1.0000 mg | ORAL_TABLET | Freq: Three times a day (TID) | ORAL | Status: DC | PRN
  Administered 2022-05-13 – 2022-05-17 (×11): 1 mg via ORAL
  Filled 2022-05-13 (×11): qty 2

## 2022-05-13 MED ORDER — SODIUM CHLORIDE 0.9 % IV BOLUS
1000.0000 mL | Freq: Once | INTRAVENOUS | Status: AC
Start: 2022-05-13 — End: 2022-05-13
  Administered 2022-05-13: 1000 mL via INTRAVENOUS

## 2022-05-13 NOTE — ED Triage Notes (Signed)
Pt brought in by [facility] from home with c/o multiple falls and weakness. Pt is requesting placement for rehab. Pt c/o pain to right arm (known recent fracture) and left knee pain. Fall at home today. Pt has been here recently and offered placement, but refused and went home. VS WNL per EMS.

## 2022-05-13 NOTE — ED Provider Notes (Addendum)
[facility] EMERGENCY DEPARTMENT Provider Note   CSN: [id number] Arrival date & time: 05/13/22  1818     History  Chief Complaint  Patient presents with   Weakness    Jamie [name] is a 66 y.o. female.  Patient brought in by EMS.  There is been multiple falls over the last several weeks.  Patient had a fall today hit the back of her head.  Patient has a splint on her right forearm for a distal radius fracture.  Patient has not followed up with orthopedics.  Chart review shows that patient was seen at [facility] on April 1 with a forearm fracture that was reduced and she was referred to [facility] and the splint was placed at that time.  April 12 patient back in for alcohol abuse x-ray of the arm again with no significant change.  May 10 patient brought in for failure to thrive.  Not taking care of herself multiple falls.  Patient spent a few days in the hospital was assigned to rehab patient went to rehab and either patient did not want to stay in rehab or either rehab discharged her.  At that time patient's daughter had called authorities to have her placed in rehab because she was unable to take care of herself.  Patient not acting intoxicated today.  Patient does use a walker at home.  Patient stating that she has had multiple falls cannot take care of self at home she needs to go back to rehab.  Past medical history significant for alcohol abuse chronic pain headaches.  Patient's had back surgery patient's had a laparoscopic oophorectomy.  Patient never smoked.      Home Medications Prior to Admission medications   Medication Sig Start Date End Date Taking? Authorizing Provider  ALPRAZolam Duanne Moron) 1 MG tablet Take 1 mg by mouth 3 (three) times daily as needed for anxiety or sleep. 04/29/22   [provider]  butalbital-acetaminophen-caffeine (FIORICET) 50-325-40 MG tablet Take 1-2 tablets by mouth 2 (two) times daily as needed for headache.    [provider]   oxyCODONE (ROXICODONE) 5 MG immediate release tablet Take 2 tablets (10 mg total) by mouth every 6 (six) hours as needed for severe pain. 04/11/22   Fredia Sorrow, MD  potassium chloride (KLOR-CON) 10 MEQ tablet Take 1 tablet (10 mEq total) by mouth 2 (two) times daily for 7 days. Patient not taking: Reported on 04/06/2022 05/19/21 04/06/22  Dorie Rank, MD  traZODone (DESYREL) 100 MG tablet Take 100 mg by mouth at bedtime. 03/17/22   [provider]      Allergies    Amoxicillin-pot clavulanate, Azithromycin, Bupropion, Nsaids, Prednisone, and Vilazodone    Review of Systems   Review of Systems  Constitutional:  Negative for chills and fever.  HENT:  Negative for ear pain and sore throat.   Eyes:  Negative for pain and visual disturbance.  Respiratory:  Negative for cough and shortness of breath.   Cardiovascular:  Negative for chest pain and palpitations.  Gastrointestinal:  Negative for abdominal pain and vomiting.  Genitourinary:  Negative for dysuria and hematuria.  Musculoskeletal:  Negative for arthralgias, back pain and neck pain.  Skin:  Negative for color change and rash.  Neurological:  Positive for weakness and headaches. Negative for seizures and syncope.  All other systems reviewed and are negative.  Physical Exam Updated Vital Signs BP (!) 109/58   Pulse 61   Temp 97.9 F (36.6 C)   Resp 16  Ht 1.651 m ($Remove'5\' 5"'rioyQRx$ )   Wt 54.4 kg   SpO2 100%   BMI 19.97 kg/m  Physical Exam Vitals and nursing note reviewed.  Constitutional:      General: She is not in acute distress.    Appearance: Normal appearance. She is well-developed. She is not ill-appearing.  HENT:     Head: Normocephalic.     Comments: Tenderness to palpation of the back of the head.  Questionable hematoma.    Mouth/Throat:     Mouth: Mucous membranes are dry.  Eyes:     Extraocular Movements: Extraocular movements intact.     Conjunctiva/sclera: Conjunctivae normal.     Pupils: Pupils are  equal, round, and reactive to light.  Neck:     Comments: Good range of motion of the neck.  No posterior tenderness. Cardiovascular:     Rate and Rhythm: Normal rate and regular rhythm.     Heart sounds: No murmur heard. Pulmonary:     Effort: Pulmonary effort is normal. No respiratory distress.     Breath sounds: Normal breath sounds.  Abdominal:     Palpations: Abdomen is soft.     Tenderness: There is no abdominal tenderness.  Musculoskeletal:        General: No swelling.     Cervical back: Neck supple. No rigidity or tenderness.     Right lower leg: No edema.     Left lower leg: No edema.     Comments: Right forearm in sugar-tong splint.  Good cap refill to the fingers.  Good movement of the fingers  Skin:    General: Skin is warm and dry.     Capillary Refill: Capillary refill takes less than 2 seconds.  Neurological:     General: No focal deficit present.     Mental Status: She is alert and oriented to person, place, and time.     Cranial Nerves: No cranial nerve deficit.     Sensory: No sensory deficit.  Psychiatric:        Mood and Affect: Mood normal.    ED Results / Procedures / Treatments   Labs (all labs ordered are listed, but only abnormal results are displayed) Labs Reviewed  CBC WITH DIFFERENTIAL/PLATELET - Abnormal; Notable for the following components:      Result Value   Hemoglobin 11.8 (*)    All other components within normal limits  COMPREHENSIVE METABOLIC PANEL - Abnormal; Notable for the following components:   Glucose, Bld 100 (*)    Total Protein 6.3 (*)    Albumin 3.2 (*)    ALT 68 (*)    Alkaline Phosphatase 228 (*)    Total Bilirubin 0.1 (*)    All other components within normal limits  LIPASE, BLOOD  URINALYSIS, ROUTINE W REFLEX MICROSCOPIC    EKG None  Radiology DG Chest 2 View  Result Date: 05/13/2022 CLINICAL DATA:  Failure to thrive.  Weakness with multiple falls. EXAM: CHEST - 2 VIEW COMPARISON:  Radiograph 05/04/2022,  additional priors reviewed. FINDINGS: The heart is normal in size. Unchanged mediastinal contours with mild aortic atherosclerosis and tortuosity. Subsegmental scarring at the left greater than right lung base. No acute airspace disease. No pulmonary edema, pleural effusion, or pneumothorax. The bones are diffusely under mineralized. IMPRESSION: 1. No acute abnormality.  Mild bibasilar scarring. 2. Mild aortic atherosclerosis and tortuosity. Electronically Signed   By: Keith Rake M.D.   On: 05/13/2022 19:25   DG Forearm Right  Result Date: 05/13/2022 CLINICAL  DATA:  Follow-up fractures EXAM: RIGHT FOREARM - 2 VIEW COMPARISON:  04/06/2022 FINDINGS: Casting is again identified. Distal radial fracture is seen with mild callus formation. The overall alignment is similar. No new focal abnormality is noted. IMPRESSION: Healing distal radial fracture Electronically Signed   By: Inez Catalina M.D.   On: 05/13/2022 21:35    Procedures Procedures    Medications Ordered in ED Medications  0.9 %  sodium chloride infusion (1,000 mLs Intravenous New Bag/Given 05/13/22 2128)  ALPRAZolam Duanne Moron) tablet 1 mg (1 mg Oral Given 05/13/22 2105)  oxyCODONE (Oxy IR/ROXICODONE) immediate release tablet 10 mg (10 mg Oral Given 05/13/22 2106)  traZODone (DESYREL) tablet 100 mg (has no administration in time range)  sodium chloride 0.9 % bolus 1,000 mL (0 mLs Intravenous Stopped 05/13/22 2100)    ED Course/ Medical Decision Making/ A&P                           Medical Decision Making Amount and/or Complexity of Data Reviewed Labs: ordered. Radiology: ordered.  Risk Prescription drug management.   Patient back for nursing home rehab placement.  Social worker case management and physical therapy consults placed.  We will rex-ray her right forearm.  And will get CT head because she hit the back of her head with today's fall.  Patient receiving IV fluids.  Patient potassium is good today.  She had low potassium  before.  Renal functions good.  CBC no leukocytosis hemoglobin 02.4 complete metabolic panel ALT 68 alk phos 228 total bili 0.1 GFR greater than 60 blood sugar 100 potassium 4.3 electrolytes normal no anion gap lipase not elevated.  Chest x-ray no acute abnormality.  CT head pending an x-ray of the forearm pending.  Patient's home meds have been ordered.  CT head does not show any acute injury.  X-ray of the forearm shows callus formation and healing distal radial fracture.  Patient medically cleared no indication for medical admission.  We will get social services case management and physical therapy involved for rehab placement.  Nursing home placement Final Clinical Impression(s) / ED Diagnoses Final diagnoses:  Failure to thrive in adult  Falls frequently    Rx / DC Orders ED Discharge Orders     None         Fredia Sorrow, MD 05/13/22 2139    Fredia Sorrow, MD 05/13/22 2235

## 2022-05-14 LAB — URINALYSIS, ROUTINE W REFLEX MICROSCOPIC
Bilirubin Urine: NEGATIVE
Glucose, UA: NEGATIVE mg/dL
Ketones, ur: NEGATIVE mg/dL
Nitrite: POSITIVE — AB
Protein, ur: NEGATIVE mg/dL
Specific Gravity, Urine: 1.009 (ref 1.005–1.030)
pH: 5 (ref 5.0–8.0)

## 2022-05-14 MED ORDER — BUTALBITAL-APAP-CAFFEINE 50-325-40 MG PO TABS
1.0000 | ORAL_TABLET | Freq: Once | ORAL | Status: AC
Start: 2022-05-14 — End: 2022-05-14
  Administered 2022-05-14: 1 via ORAL
  Filled 2022-05-14: qty 1

## 2022-05-14 NOTE — Evaluation (Signed)
Physical Therapy Evaluation Patient Details Name: Jamie [name] MRN: [id number] DOB: 05-11-1956 Today's Date: 05/14/2022  History of Present Illness  Jamie [name] is a 66 y.o. female.     Patient brought in by EMS.  There is been multiple falls over the last several weeks.  Patient had a fall today hit the back of her head.  Patient has a splint on her right forearm for a distal radius fracture.  Patient has not followed up with orthopedics.     Chart review shows that patient was seen [facility] on April 1 with a forearm fracture that was reduced and she was referred [facility] and the splint was placed at that time.  April 12 patient back in for alcohol abuse x-ray of the arm again with no significant change.  May 10 patient brought in for failure to thrive.  Not taking care of herself multiple falls.  Patient spent a few days in the hospital was assigned to rehab patient went to rehab and either patient did not want to stay in rehab or either rehab discharged her.  At that time patient's daughter had called authorities to have her placed in rehab because she was unable to take care of herself.  Patient not acting intoxicated today.  Patient does use a walker at home.     Patient stating that she has had multiple falls cannot take care of self at home she needs to go back to rehab.  Past medical history significant for alcohol abuse chronic pain headaches.  Patient's had back surgery patient's had a laparoscopic oophorectomy.  Patient never smoked.   Clinical Impression  Patient limited for functional mobility as stated below secondary to pain, BLE weakness, fatigue and poor standing balance. Patient requiring assist with all mobility due to weakness, pain and balance deficits. She stands at bedside with HHA and is unable to complete standing march. She is able to take a couple small steps forward with assist but is very limited for mobility at this time. Patient will benefit from continued  physical therapy in hospital and recommended venue below to increase strength, balance, endurance for safe ADLs and gait.        Recommendations for follow up therapy are one component of a multi-disciplinary discharge planning process, led by the attending physician.  Recommendations may be updated based on patient status, additional functional criteria and insurance authorization.  Follow Up Recommendations Skilled nursing-short term rehab (<3 hours/day)    Assistance Recommended at Discharge Intermittent Supervision/Assistance  Patient can return home with the following  A lot of help with walking and/or transfers;A lot of help with bathing/dressing/bathroom;Assistance with cooking/housework;Help with stairs or ramp for entrance    Equipment Recommendations None recommended by PT  Recommendations for Other Services       Functional Status Assessment       Precautions / Restrictions Precautions Precautions: Fall Precaution Comments: R wrist to elbow splint Required Braces or Orthoses: Splint/Cast Restrictions Weight Bearing Restrictions: No      Mobility  Bed Mobility Overal bed mobility: Needs Assistance Bed Mobility: Supine to Sit, Sit to Supine     Supine to sit: Min assist Sit to supine: Min assist   General bed mobility comments: slow, labored    Transfers Overall transfer level: Needs assistance Equipment used: 1 person hand held assist Transfers: Sit to/from Stand Sit to Stand: Min assist, Mod assist           General transfer comment: labored,  unsteady, difficulty coming to full standing position    Ambulation/Gait Ambulation/Gait assistance: Min assist, Mod assist Gait Distance (Feet): 1 Feet Assistive device: 1 person hand held assist         General Gait Details: limited to a couple small steps at bedside due to weakness, fatigue, and L knee pain  Stairs            Wheelchair Mobility    Modified Rankin (Stroke Patients Only)        Balance Overall balance assessment: Needs assistance Sitting-balance support: Single extremity supported, Feet supported Sitting balance-Leahy Scale: Good Sitting balance - Comments: seated EOB   Standing balance support: Single extremity supported Standing balance-Leahy Scale: Poor                               Pertinent Vitals/Pain Pain Assessment Pain Assessment: Faces Faces Pain Scale: Hurts a little bit Pain Location: all over Pain Descriptors / Indicators: Grimacing, Guarding Pain Intervention(s): Limited activity within patient's tolerance, Monitored during session, Repositioned    Home Living Family/patient expects to be discharged to:: Private residence Living Arrangements: Alone Available Help at Discharge: Family;Available PRN/intermittently Type of Home: House Home Access: Stairs to enter Entrance Stairs-Rails: None Entrance Stairs-Number of Steps: 3   Home Layout: One level Home Equipment: Agricultural consultant (2 wheels);Cane - quad      Prior Function Prior Level of Function : Needs assist       Physical Assist : Mobility (physical);ADLs (physical) Mobility (physical): Transfers;Gait ADLs (physical): Dressing;Bathing;Toileting;IADLs Mobility Comments: Pt reports household ambulation with quad cane but recently pt has been in bed much of the time. ADLs Comments: assisted by daughter a little     Hand Dominance   Dominant Hand: Right    Extremity/Trunk Assessment        Lower Extremity Assessment Lower Extremity Assessment: Generalized weakness       Communication   Communication: No difficulties  Cognition Arousal/Alertness: Awake/alert Behavior During Therapy: WFL for tasks assessed/performed Overall Cognitive Status: Within Functional Limits for tasks assessed                                          General Comments      Exercises     Assessment/Plan    PT Assessment Patient needs continued PT  services  PT Problem List Decreased strength;Decreased activity tolerance;Decreased balance;Decreased range of motion;Decreased mobility       PT Treatment Interventions DME instruction;Gait training;Stair training;Functional mobility training;Therapeutic activities;Therapeutic exercise;Balance training;Patient/family education    PT Goals (Current goals can be found in the Care Plan section)  Acute Rehab PT Goals Patient Stated Goal: return home after rehab PT Goal Formulation: With patient Time For Goal Achievement: 05/28/22 Potential to Achieve Goals: Fair    Frequency Min 2X/week     Co-evaluation               AM-PAC PT "6 Clicks" Mobility  Outcome Measure Help needed turning from your back to your side while in a flat bed without using bedrails?: A Little Help needed moving from lying on your back to sitting on the side of a flat bed without using bedrails?: A Little Help needed moving to and from a bed to a chair (including a wheelchair)?: A Lot Help needed standing up from a chair using your arms (  e.g., wheelchair or bedside chair)?: A Lot Help needed to walk in hospital room?: A Lot Help needed climbing 3-5 steps with a railing? : A Lot 6 Click Score: 14    End of Session   Activity Tolerance: Patient tolerated treatment well;No increased pain;Patient limited by fatigue Patient left: in bed;with call bell/phone within reach Nurse Communication: Mobility status PT Visit Diagnosis: Unsteadiness on feet (R26.81);Other abnormalities of gait and mobility (R26.89);Muscle weakness (generalized) (M62.81)    Time: 4782-9562 PT Time Calculation (min) (ACUTE ONLY): 16 min   Charges:   PT Evaluation $PT Eval Low Complexity: 1 Low PT Treatments $Therapeutic Activity: 8-22 mins        12:37 PM, 05/14/22 Wyman Songster PT, DPT Physical Therapist at [facility]

## 2022-05-14 NOTE — Plan of Care (Signed)
  Problem: Acute Rehab PT Goals(only PT should resolve) Goal: Pt Will Go Supine/Side To Sit Outcome: Progressing Flowsheets (Taken 05/14/2022 1238) Pt will go Supine/Side to Sit:  with supervision  with min guard assist Goal: Pt Will Go Sit To Supine/Side Outcome: Progressing Flowsheets (Taken 05/14/2022 1238) Pt will go Sit to Supine/Side:  with min guard assist  with supervision Goal: Patient Will Transfer Sit To/From Stand Outcome: Progressing Flowsheets (Taken 05/14/2022 1238) Patient will transfer sit to/from stand:  with min guard assist  with minimal assist Goal: Pt Will Transfer Bed To Chair/Chair To Bed Outcome: Progressing Flowsheets (Taken 05/14/2022 1238) Pt will Transfer Bed to Chair/Chair to Bed:  min guard assist  with min assist Goal: Pt Will Ambulate Outcome: Progressing Flowsheets (Taken 05/14/2022 1238) Pt will Ambulate:  25 feet  with least restrictive assistive device  with minimal assist  with moderate assist Goal: Pt/caregiver will Perform Home Exercise Program Outcome: Progressing Flowsheets (Taken 05/14/2022 1238) Pt/caregiver will Perform Home Exercise Program:  For increased strengthening  For improved balance  Independently  12:39 PM, 05/14/22 Wyman Songster PT, DPT Physical Therapist at [facility]

## 2022-05-14 NOTE — ED Notes (Signed)
Gave pt dinner tray. Informed nurse. 

## 2022-05-14 NOTE — ED Notes (Signed)
PT at BS.

## 2022-05-14 NOTE — ED Notes (Signed)
Pt eating, NAD, calm, interactive. Endorses HA and pain. Denies other sx or complaints. Currently eating breakfast.

## 2022-05-15 MED ORDER — BUTALBITAL-APAP-CAFFEINE 50-325-40 MG PO TABS
1.0000 | ORAL_TABLET | Freq: Two times a day (BID) | ORAL | Status: DC | PRN
  Administered 2022-05-15 – 2022-05-17 (×5): 2 via ORAL
  Filled 2022-05-15: qty 1
  Filled 2022-05-15: qty 2
  Filled 2022-05-15: qty 1
  Filled 2022-05-15 (×3): qty 2

## 2022-05-15 NOTE — ED Notes (Signed)
Pt changed. Clean brief placed on pt Barrier cream applied to bottom

## 2022-05-15 NOTE — ED Notes (Signed)
Pt soiled brief. Pericare done and new underpad and brief placed on pt by 2 NTs.

## 2022-05-15 NOTE — Progress Notes (Signed)
CSW faxed patient's clinical information out to local facilities for review.  Madilyn Fireman, MSW, LCSW Transitions of Care  Clinical Social Worker II [phone]

## 2022-05-15 NOTE — ED Provider Notes (Signed)
  Physical Exam  BP 128/64   Pulse 76   Temp 97.8 F (36.6 C) (Oral)   Resp 15   Ht  (1.651 m)   Wt 54.4 kg   SpO2 97%   BMI 19.97 kg/m   Physical Exam  Procedures  Procedures  ED Course / MDM    Medical Decision Making Amount and/or Complexity of Data Reviewed Labs: ordered. Radiology: ordered.  Risk Prescription drug management.   Patient's been here for 38 hours.  Pending skilled nursing placement.       Benjiman Core, MD 05/15/22 [phone]

## 2022-05-15 NOTE — ED Notes (Signed)
Pt soiled brief and incont pad. Pt cleaned and peri care preformed. Nurse aware.

## 2022-05-16 NOTE — Progress Notes (Signed)
CSW spoke to Pt, CSW explained to Pt that we are awaiting approval for insurance, CSW also explained to the Pt that any concerns to reach out to her nurse. Pt ask to leave CSW explained that the process would have to be completed while the Pt was in the ED. Insurance and placement may take a couple of days. TOC will continue to follow Pt and update when insurance Auth is complete.

## 2022-05-16 NOTE — Progress Notes (Signed)
CSW spoke with Jamie [name]'s via phone, Jamie [name] stated she refused SNF placement last week due to being in copy days and not knowing if she will have the money to pay.   CSW spoke with Jamie [name] from [facility] who is able to offer Jamie [name] a bed, however, Jamie [name] does have a 200$ a day copay. Jamie [name] was able to verify Jamie [name]'s Medicaid, which will cover Copay.  CSW discussed Jamie [name]'s options and Jamie [name] agrees to SNF placement. CSW encouraged the patient to participate in therapy when transferred to SNF or she will be discharged back home again.   [employer] fast streamer pending. TOC to follow.   Jamie [name].Jamie [name], MSW, [place]  Transitions of Care Clinical Social Worker I Direct Dial: [phone]  Fax: [phone] [email or web address]

## 2022-05-16 NOTE — ED Provider Notes (Signed)
Emergency Medicine Observation Re-evaluation Note  Jamie [name] is a 66 y.o. female, seen on rounds today.  Pt initially presented to the ED for complaints of Weakness Currently, the patient is sleeping.  Physical Exam  BP 130/69 (BP Location: Left Arm)   Pulse 73   Temp 98 F (36.7 C) (Oral)   Resp 16   Ht 5\' 5"  (1.651 m)   Wt 54.4 kg   SpO2 98%   BMI 19.97 kg/m  Physical Exam General: Sleeping, nondistressed Cardiac: Extremities well-perfused Lungs: Breathing even and unlabored Psych: Deferred  ED Course / MDM  EKG:   I have reviewed the labs performed to date as well as medications administered while in observation.  Recent changes in the last 24 hours include none.  Plan  Current plan is for social work placement.  Jamie [name] is not under involuntary commitment.     Gloris Manchesterixon, Porchia Sinkler, MD 05/16/22 [phone]

## 2022-05-16 NOTE — ED Notes (Signed)
C/o pain and HA. Alert, NAD, calm.

## 2022-05-16 NOTE — ED Notes (Signed)
Asking for scheduled/prn meds early. Timeframe explained. Agreeable.

## 2022-05-16 NOTE — ED Notes (Signed)
Per-care performed on pt, brief changed, blankets changed and pt resting comfortably at this time

## 2022-05-16 NOTE — ED Notes (Signed)
Brief changed and pericare performed

## 2022-05-17 MED ORDER — OXYCODONE HCL 5 MG PO TABS: 5.0000 mg | ORAL_TABLET | ORAL | 0 refills | Status: DC | PRN

## 2022-05-17 MED ORDER — BUTALBITAL-APAP-CAFFEINE 50-325-40 MG PO TABS: 1.0000 | ORAL_TABLET | Freq: Two times a day (BID) | ORAL | 0 refills | Status: DC | PRN

## 2022-05-17 MED ORDER — ALPRAZOLAM 1 MG PO TABS: 1.0000 mg | ORAL_TABLET | Freq: Three times a day (TID) | ORAL | 0 refills | Status: DC | PRN

## 2022-05-17 NOTE — Progress Notes (Signed)
Patient to discharge to [facility] room A27 bed 2, discharge AVS faxed to facility.

## 2022-05-17 NOTE — ED Notes (Signed)
Pt transferred back into bed from recliner with minimal assistance. Updated on plan to discharge to [place] [facility]

## 2022-05-17 NOTE — Progress Notes (Signed)
CSW spoke to [employer] [place] [employer] about Pt getting scripts CSW spoke to [place] at [facility] and advised them to fax over.

## 2022-05-17 NOTE — ED Notes (Signed)
Pt provided breakfast tray.

## 2022-05-17 NOTE — Progress Notes (Signed)
Pt's insurance Auth still pending.   Arlie Solomons.Kaprice Kage, MSW, [place]  Transitions of Care Clinical Social Worker I Direct Dial: [phone]  Fax: [phone] [email or web address]

## 2022-05-17 NOTE — ED Notes (Signed)
Pt transported back to [facility] by EMS

## 2022-05-17 NOTE — ED Notes (Signed)
Pt on call bell complaining of headache and all over pain. Pt also states she would like her anxiety medications.  Pt urine cannister emptied and brief is dry

## 2022-05-17 NOTE — ED Notes (Signed)
Pt resting comfortably at this time.

## 2022-05-17 NOTE — ED Provider Notes (Signed)
Emergency Medicine Observation Re-evaluation Note  Jamie [name] is a 66 y.o. female, seen on rounds today.  Pt initially presented to the ED for complaints of Weakness Currently, the patient is sleeping.  Physical Exam  BP 117/66   Pulse 69   Temp 98 F (36.7 C) (Oral)   Resp 17   Ht 5\' 5"  (1.651 m)   Wt 54.4 kg   SpO2 94%   BMI 19.97 kg/m  Physical Exam General: No acute distress Cardiac: Well-perfused Lungs: Nonlabored Psych: Deferred  ED Course / MDM  EKG:   I have reviewed the labs performed to date as well as medications administered while in observation.  Recent changes in the last 24 hours include TOC working on placement.  Plan  Current plan is for placement after insurance approval.  Jamie [name] is not under involuntary commitment.  2:30 PM.  Patient has a bed at [facility].  Anticipate discharge soon.     Terrilee Files, MD 05/17/22 [phone]

## 2022-05-17 NOTE — Progress Notes (Addendum)
Physical Therapy Treatment Patient Details Name: Jamie [name] MRN: [id number] DOB: 06-25-1956 Today's Date: 05/17/2022   History of Present Illness Jamie [name] is a 66 y.o. female.     Patient brought in by EMS.  There is been multiple falls over the last several weeks.  Patient had a fall today hit the back of her head.  Patient has a splint on her right forearm for a distal radius fracture.  Patient has not followed up with orthopedics.     Chart review shows that patient was seen [facility] on [facility] 1 with a forearm fracture that was reduced and she was referred to [facility] and the splint was placed at that time.  April 12 patient back in for alcohol abuse x-ray of the arm again with no significant change.  May 10 patient brought in for failure to thrive.  Not taking care of herself multiple falls.  Patient spent a few days in the hospital was assigned to rehab patient went to rehab and either patient did not want to stay in rehab or either rehab discharged her.  At that time patient's daughter had called authorities to have her placed in rehab because she was unable to take care of herself.  Patient not acting intoxicated today.  Patient does use a walker at home.     Patient stating that she has had multiple falls cannot take care of self at home she needs to go back to rehab.  Past medical history significant for alcohol abuse chronic pain headaches.  Patient's had back surgery patient's had a laparoscopic oophorectomy.  Patient never smoked.    PT Comments    Patient improved bed mobility with supine to sit with modified independence and supervision but does require extended time along with verbal cueing to complete task. Patient is min guard with sit to stand transfer but demonstrates balance and UE strength deficits impacting safety in performing transfer with RW. Patient is min assist with bed to chair transfer with RW with balance and muscular coordination deficits continuing to be  observed during treatment session. Patient was min/mod assist with gait with RW in ability to take a few steps in the room but limited by fatigue and generalized weakness. Patient will benefit from continued skilled physical therapy in hospital and recommended venue below to increase strength, balance, endurance for safe ADLs and gait.    Recommendations for follow up therapy are one component of a multi-disciplinary discharge planning process, led by the attending physician.  Recommendations may be updated based on patient status, additional functional criteria and insurance authorization.  Follow Up Recommendations  Skilled nursing-short term rehab (<3 hours/day)     Assistance Recommended at Discharge Intermittent Supervision/Assistance  Patient can return home with the following A lot of help with walking and/or transfers;A lot of help with bathing/dressing/bathroom;Assistance with cooking/housework;Help with stairs or ramp for entrance   Equipment Recommendations  None recommended by PT    Recommendations for Other Services       Precautions / Restrictions Precautions Precautions: Fall Precaution Comments: R wrist to elbow splint Required Braces or Orthoses: Splint/Cast Restrictions Weight Bearing Restrictions: No     Mobility  Bed Mobility Overal bed mobility: Modified Independent Bed Mobility: Supine to Sit     Supine to sit: Modified independent (Device/Increase time), Supervision     General bed mobility comments: Patient was able to perform supine to sit bed mobility with modified independence requiring extended time and verbal cueing needed to  complete task. Patient also under PT supervision for safety.    Transfers Overall transfer level: Needs assistance Equipment used: Rolling walker (2 wheels) Transfers: Sit to/from Stand Sit to Stand: Supervision, Min guard   Step pivot transfers: Min assist       General transfer comment: Patient able to perform sit to  stand with supervision and min guard with utilizing RW. Patient does need extended time with difficulty utlizing trunk to create upright position. Patient is min assist with bed to chair transfer with RW in which patient demonstrates generalized weakness and balance deficits impacting ability to perform transfer independently and in a timely manner.    Ambulation/Gait Ambulation/Gait assistance: Min assist, Mod assist Gait Distance (Feet): 5 Feet Assistive device: Rolling walker (2 wheels) Gait Pattern/deviations: Decreased step length - right, Decreased step length - left, Decreased stride length Gait velocity: decreased     General Gait Details: Patient was able to ambualte for a few steps at bedside with min/mod assist with RW. Deviated gait pattern impacted by balance, fatigue and generalized weakness   Stairs             Wheelchair Mobility    Modified Rankin (Stroke Patients Only)       Balance Overall balance assessment: Needs assistance Sitting-balance support: Bilateral upper extremity supported, Feet supported Sitting balance-Leahy Scale: Good Sitting balance - Comments: Patient able to sit EOB with ability to support themselves with UE.   Standing balance support: Single extremity supported, During functional activity, Reliant on assistive device for balance Standing balance-Leahy Scale: Poor Standing balance comment: Patient reliant on RW for balance along with decreased trunk control impacting posture related to balance in COG.                            Cognition Arousal/Alertness: Awake/alert Behavior During Therapy: WFL for tasks assessed/performed Overall Cognitive Status: Within Functional Limits for tasks assessed                                          Exercises General Exercises - Lower Extremity Ankle Circles/Pumps: AROM, Left, 15 reps, Seated Long Arc Quad: AROM, Strengthening, Both, 10 reps, Seated Hip  Flexion/Marching: AROM, 10 reps, Strengthening, Both, Seated Toe Raises: AROM, Both, Strengthening, 15 reps, Seated Heel Raises: 15 reps, AROM, Strengthening, Both, Seated    General Comments        Pertinent Vitals/Pain Pain Assessment Pain Assessment: Faces Faces Pain Scale: Hurts a little bit Pain Location: all over Pain Descriptors / Indicators: Grimacing, Guarding Pain Intervention(s): Limited activity within patient's tolerance, Monitored during session, Repositioned    Home Living                          Prior Function            PT Goals (current goals can now be found in the care plan section) Acute Rehab PT Goals Patient Stated Goal: return home after rehab PT Goal Formulation: With patient Time For Goal Achievement: 05/28/22 Potential to Achieve Goals: Fair Progress towards PT goals: Progressing toward goals    Frequency    Min 2X/week      PT Plan      Co-evaluation              AM-PAC PT "6 Clicks" Mobility  Outcome Measure  Help needed turning from your back to your side while in a flat bed without using bedrails?: None Help needed moving from lying on your back to sitting on the side of a flat bed without using bedrails?: A Little Help needed moving to and from a bed to a chair (including a wheelchair)?: A Lot Help needed standing up from a chair using your arms (e.g., wheelchair or bedside chair)?: A Lot Help needed to walk in hospital room?: A Lot Help needed climbing 3-5 steps with a railing? : A Lot 6 Click Score: 15    End of Session   Activity Tolerance: Patient tolerated treatment well;No increased pain;Patient limited by fatigue Patient left: in bed;with call bell/phone within reach Nurse Communication: Mobility status PT Visit Diagnosis: Unsteadiness on feet (R26.81);Other abnormalities of gait and mobility (R26.89);Muscle weakness (generalized) (M62.81)     Time: 1093-2355 PT Time Calculation (min) (ACUTE  ONLY): 20 min  Charges:  $Therapeutic Exercise: 8-22 mins $Therapeutic Activity: 8-22 mins                     2:00 PM, 05/17/22 Arther Abbott, S/PT   During this treatment session, the therapist was present, participating in and directing the treatment.  2:23 PM, 05/17/22 Ocie Bob, MPT Physical Therapist with [facility] 336 [phone] office [phone] mobile phone

## 2022-05-25 ENCOUNTER — Emergency Department (HOSPITAL_COMMUNITY)
Admission: EM | Admit: 2022-05-25 | Discharge: 2022-05-25 | Disposition: A | Discharge status: Home / Self Care | Payer: Medicare HMO | Attending: Emergency Medicine | Admitting: Emergency Medicine

## 2022-05-25 ENCOUNTER — Encounter (HOSPITAL_COMMUNITY): Payer: Self-pay | Admitting: Emergency Medicine

## 2022-05-25 ENCOUNTER — Other Ambulatory Visit: Payer: Self-pay

## 2022-05-25 DIAGNOSIS — F1123 Opioid dependence with withdrawal: Secondary | ICD-10-CM | POA: Diagnosis not present

## 2022-05-25 DIAGNOSIS — G43809 Other migraine, not intractable, without status migrainosus: Secondary | ICD-10-CM | POA: Insufficient documentation

## 2022-05-25 DIAGNOSIS — G8929 Other chronic pain: Secondary | ICD-10-CM | POA: Insufficient documentation

## 2022-05-25 DIAGNOSIS — M791 Myalgia, unspecified site: Secondary | ICD-10-CM

## 2022-05-25 DIAGNOSIS — Z79891 Long term (current) use of opiate analgesic: Secondary | ICD-10-CM | POA: Diagnosis not present

## 2022-05-25 DIAGNOSIS — Z79899 Other long term (current) drug therapy: Secondary | ICD-10-CM | POA: Diagnosis not present

## 2022-05-25 DIAGNOSIS — R5381 Other malaise: Secondary | ICD-10-CM | POA: Diagnosis not present

## 2022-05-25 LAB — BASIC METABOLIC PANEL
Anion gap: 6 (ref 5–15)
BUN: 13 mg/dL (ref 8–23)
CO2: 26 mmol/L (ref 22–32)
Calcium: 9.8 mg/dL (ref 8.9–10.3)
Chloride: 104 mmol/L (ref 98–111)
Creatinine, Ser: 0.65 mg/dL (ref 0.44–1.00)
GFR, Estimated: >60 mL/min (ref >60)
Glucose, Bld: 83 mg/dL (ref 70–99)
Potassium: 3.7 mmol/L (ref 3.5–5.1)
Sodium: 136 mmol/L (ref 135–145)

## 2022-05-25 LAB — CBC
HCT: 37.5 % (ref 36.0–46.0)
Hemoglobin: 12.2 g/dL (ref 12.0–15.0)
MCH: 30.5 pg (ref 26.0–34.0)
MCHC: 32.5 g/dL (ref 30.0–36.0)
MCV: 93.8 fL (ref 80.0–100.0)
Platelets: 260 10*3/uL (ref 150–400)
RBC: 4 MIL/uL (ref 3.87–5.11)
RDW: 13.7 % (ref 11.5–15.5)
WBC: 7.3 10*3/uL (ref 4.0–10.5)
nRBC: 0 % (ref 0.0–0.2)

## 2022-05-25 LAB — ETHANOL: Alcohol, Ethyl (B): <10 mg/dL (ref <10)

## 2022-05-25 MED ORDER — ONDANSETRON 8 MG PO TBDP
8.0000 mg | ORAL_TABLET | Freq: Once | ORAL | Status: AC
  Administered 2022-05-25: 8 mg via ORAL
  Filled 2022-05-25: qty 1

## 2022-05-25 MED ORDER — OXYCODONE HCL 5 MG PO TABS
10.0000 mg | ORAL_TABLET | Freq: Once | ORAL | Status: AC
  Administered 2022-05-25: 10 mg via ORAL
  Filled 2022-05-25: qty 2

## 2022-05-25 MED ORDER — BUTALBITAL-APAP-CAFFEINE 50-325-40 MG PO TABS
2.0000 | ORAL_TABLET | Freq: Once | ORAL | Status: AC
  Administered 2022-05-25: 2 via ORAL
  Filled 2022-05-25: qty 2

## 2022-05-25 MED ORDER — OXYCODONE HCL 10 MG PO TABS: 10.0000 mg | ORAL_TABLET | Freq: Four times a day (QID) | ORAL | 0 refills | Status: DC | PRN

## 2022-05-25 NOTE — ED Provider Notes (Signed)
[facility] EMERGENCY DEPARTMENT Provider Note   CSN: [id number] Arrival date & time: 05/25/22  1719     History  Chief Complaint  Patient presents with   Withdrawal    Jamie [name] is a 66 y.o. female.  HPI  Patient presents to the ED with complaints of generalized malaise, and chronic pain.  Patient states she has history of chronic pain.  She is on chronic opiates oxycodone.  Patient was recently seen in the emergency room on May 19.  At that time the patient was evaluated for failure to thrive.  She does have history of chronic alcohol abuse.  Patient had been having falls and did injure her wrist sustaining a fracture.  Patient was transferred to a skilled nursing facility.  Patient ended up leaving her skilled nursing facility earlier this week.  Patient states she was feeling better but she did not have her chronic pain medications.  Since leaving the facility she has had increasing pain and discomfort throughout.  Outpatient notes reviewed and the patient did call her doctor's office requesting a refill of her medications.  Patient is scheduled to see her doctor on June 2.  According to the records the patient checked herself out from the skilled nursing facility but she was walking on her own.  Home Medications Prior to Admission medications   Medication Sig Start Date End Date Taking? Authorizing Provider  oxyCODONE 10 MG TABS Take 1 tablet (10 mg total) by mouth every 6 (six) hours as needed for severe pain. 05/25/22  Yes Linwood Dibbles, MD  ALPRAZolam Prudy Feeler) 1 MG tablet Take 1 tablet (1 mg total) by mouth 3 (three) times daily as needed for anxiety or sleep. 05/17/22   Mancel Bale, MD  butalbital-acetaminophen-caffeine (FIORICET) [phone] MG tablet Take 1-2 tablets by mouth 2 (two) times daily as needed for headache. 05/17/22   Mancel Bale, MD  loperamide (IMODIUM) 2 MG capsule Take 2 mg by mouth every 6 (six) hours as needed. 05/09/22   [provider]  OVER THE  COUNTER MEDICATION calcium    [provider]  oxyCODONE (ROXICODONE) 5 MG immediate release tablet Take 1 tablet (5 mg total) by mouth every 4 (four) hours as needed for severe pain. 05/17/22   Mancel Bale, MD  potassium chloride (KLOR-CON) 10 MEQ tablet Take 1 tablet (10 mEq total) by mouth 2 (two) times daily for 7 days. Patient not taking: Reported on 04/06/2022 05/19/21 04/06/22  Linwood Dibbles, MD  traZODone (DESYREL) 100 MG tablet Take 100 mg by mouth at bedtime. 03/17/22   [provider]      Allergies    Amoxicillin-pot clavulanate, Azithromycin, Bupropion, Nsaids, Prednisone, and Vilazodone    Review of Systems   Review of Systems  Constitutional:  Negative for fever.  Neurological:  Negative for syncope.   Physical Exam Updated Vital Signs BP 118/80 (BP Location: Left Arm)   Pulse 77   Temp 97.9 F (36.6 C) (Oral)   Resp 16   Ht 1.651 m ( )   Wt 54.4 kg   SpO2 97%   BMI 19.97 kg/m  Physical Exam Vitals and nursing note reviewed.  Constitutional:      General: She is not in acute distress.    Appearance: She is well-developed.  HENT:     Head: Normocephalic and atraumatic.     Right Ear: External ear normal.     Left Ear: External ear normal.  Eyes:     General: No scleral  icterus.       Right eye: No discharge.        Left eye: No discharge.     Conjunctiva/sclera: Conjunctivae normal.  Neck:     Trachea: No tracheal deviation.  Cardiovascular:     Rate and Rhythm: Normal rate and regular rhythm.  Pulmonary:     Effort: Pulmonary effort is normal. No respiratory distress.     Breath sounds: No stridor.  Abdominal:     General: There is no distension.  Musculoskeletal:        General: No swelling or deformity.     Cervical back: Neck supple.     Comments: Right upper extremity in a cast  Skin:    General: Skin is warm and dry.     Findings: No rash.  Neurological:     Mental Status: She is alert.     Cranial Nerves: Cranial nerve  deficit: no gross deficits.    ED Results / Procedures / Treatments   Labs (all labs ordered are listed, but only abnormal results are displayed) Labs Reviewed  CBC  BASIC METABOLIC PANEL  ETHANOL    EKG None  Radiology No results found.  Procedures Procedures    Medications Ordered in ED Medications  butalbital-acetaminophen-caffeine (FIORICET) 50-325-40 MG per tablet 2 tablet (has no administration in time range)  oxyCODONE (Oxy IR/ROXICODONE) immediate release tablet 10 mg (10 mg Oral Given 05/25/22 1824)  ondansetron (ZOFRAN-ODT) disintegrating tablet 8 mg (8 mg Oral Given 05/25/22 1824)    ED Course/ Medical Decision Making/ A&P Clinical Course as of 05/25/22 2022  Wed May 25, 2022  2008 CBC CBC normal [JK]  2008 Ethanol Ethanol normal [JK]  2008 Basic metabolic panel Normal [JK]  2022 Patient now requesting Fioricet.  She states she has developed a migraine [JK]    Clinical Course User Index [JK] Linwood Dibbles, MD                           Medical Decision Making Problems Addressed: Chronic prescription opiate use: chronic illness or injury with exacerbation, progression, or side effects of treatment Myalgia: complicated acute illness or injury Other migraine without status migrainosus, not intractable: complicated acute illness or injury  Amount and/or Complexity of Data Reviewed External Data Reviewed: notes.    Details: Previous hospitalization notes reviewed Labs: ordered. Decision-making details documented in ED Course.  Risk Prescription drug management.   Patient presented to the ED with complaints of myalgias and not having her chronic pain medications.  Patient was recently in rehab facility and checked her cell fell.  I reviewed her outpatient records and she did call her doctor about trying to get a refill.  Patient was unable to get that prescription.  She does not have any signs of acute infection or acute electrolyte abnormalities.  Patient  was given a dose of her chronic opiate pain medications.  I will give her a 1 day prescription to help cover her until she can get a refill from her PCP.        Final Clinical Impression(s) / ED Diagnoses Final diagnoses:  Chronic prescription opiate use  Myalgia  Other migraine without status migrainosus, not intractable    Rx / DC Orders ED Discharge Orders          Ordered    oxyCODONE 10 MG TABS  Every 6 hours PRN        05/25/22 2010  Linwood Dibbles, MD 05/25/22 2022

## 2022-05-25 NOTE — Discharge Instructions (Addendum)
Take your pain medications as prescribed.  I did give you a refill of 1 day to help cover you until you can see your primary doctor.

## 2022-05-25 NOTE — ED Notes (Signed)
Dinner tray given to patient, warm compress on forehead for comfort, patient repositioned in bed.

## 2022-05-25 NOTE — ED Triage Notes (Signed)
Pt to the ED via [facility] after [facility] daily pain medication.  Pt states she thought she was walking well enough to perform ADLs on her own so she checked out to go back home.  Pt has not had her pain medication since Monday.

## 2022-05-27 ENCOUNTER — Emergency Department (HOSPITAL_COMMUNITY): Payer: Medicare HMO

## 2022-05-27 ENCOUNTER — Encounter (HOSPITAL_COMMUNITY): Payer: Self-pay

## 2022-05-27 ENCOUNTER — Other Ambulatory Visit: Payer: Self-pay

## 2022-05-27 ENCOUNTER — Emergency Department (HOSPITAL_COMMUNITY)
Admission: EM | Admit: 2022-05-27 | Discharge: 2022-05-28 | Disposition: A | Discharge status: Home / Self Care | Payer: Medicare HMO | Attending: Emergency Medicine | Admitting: Emergency Medicine

## 2022-05-27 DIAGNOSIS — M25531 Pain in right wrist: Secondary | ICD-10-CM | POA: Diagnosis not present

## 2022-05-27 DIAGNOSIS — R627 Adult failure to thrive: Secondary | ICD-10-CM | POA: Insufficient documentation

## 2022-05-27 DIAGNOSIS — R52 Pain, unspecified: Secondary | ICD-10-CM

## 2022-05-27 DIAGNOSIS — G8929 Other chronic pain: Secondary | ICD-10-CM | POA: Insufficient documentation

## 2022-05-27 LAB — CBC WITH DIFFERENTIAL/PLATELET
Abs Immature Granulocytes: 0.03 10*3/uL (ref 0.00–0.07)
Basophils Absolute: 0.1 10*3/uL (ref 0.0–0.1)
Basophils Relative: 1 %
Eosinophils Absolute: 0.2 10*3/uL (ref 0.0–0.5)
Eosinophils Relative: 2 %
HCT: 37.5 % (ref 36.0–46.0)
Hemoglobin: 12.2 g/dL (ref 12.0–15.0)
Immature Granulocytes: 0 %
Lymphocytes Relative: 23 %
Lymphs Abs: 1.6 10*3/uL (ref 0.7–4.0)
MCH: 30.1 pg (ref 26.0–34.0)
MCHC: 32.5 g/dL (ref 30.0–36.0)
MCV: 92.6 fL (ref 80.0–100.0)
Monocytes Absolute: 0.4 10*3/uL (ref 0.1–1.0)
Monocytes Relative: 6 %
Neutro Abs: 4.7 10*3/uL (ref 1.7–7.7)
Neutrophils Relative %: 68 %
Platelets: 244 10*3/uL (ref 150–400)
RBC: 4.05 MIL/uL (ref 3.87–5.11)
RDW: 13.4 % (ref 11.5–15.5)
WBC: 6.9 10*3/uL (ref 4.0–10.5)
nRBC: 0 % (ref 0.0–0.2)

## 2022-05-27 LAB — COMPREHENSIVE METABOLIC PANEL
ALT: 83 U/L — ABNORMAL HIGH (ref 0–44)
AST: 72 U/L — ABNORMAL HIGH (ref 15–41)
Albumin: 3.6 g/dL (ref 3.5–5.0)
Alkaline Phosphatase: 187 U/L — ABNORMAL HIGH (ref 38–126)
Anion gap: 6 (ref 5–15)
BUN: 16 mg/dL (ref 8–23)
CO2: 27 mmol/L (ref 22–32)
Calcium: 9.7 mg/dL (ref 8.9–10.3)
Chloride: 104 mmol/L (ref 98–111)
Creatinine, Ser: 0.82 mg/dL (ref 0.44–1.00)
GFR, Estimated: >60 mL/min (ref >60)
Glucose, Bld: 81 mg/dL (ref 70–99)
Potassium: 3.6 mmol/L (ref 3.5–5.1)
Sodium: 137 mmol/L (ref 135–145)
Total Bilirubin: 0.4 mg/dL (ref 0.3–1.2)
Total Protein: 6.3 g/dL — ABNORMAL LOW (ref 6.5–8.1)

## 2022-05-27 MED ORDER — OXYCODONE HCL 5 MG PO TABS
10.0000 mg | ORAL_TABLET | Freq: Once | ORAL | Status: AC
  Administered 2022-05-27: 10 mg via ORAL
  Filled 2022-05-27: qty 2

## 2022-05-27 MED ORDER — LOPERAMIDE HCL 2 MG PO CAPS
2.0000 mg | ORAL_CAPSULE | Freq: Four times a day (QID) | ORAL | Status: DC | PRN
Start: 2022-05-27 — End: 2022-05-28
  Administered 2022-05-27: 2 mg via ORAL
  Filled 2022-05-27: qty 1

## 2022-05-27 MED ORDER — TRAZODONE HCL 50 MG PO TABS
100.0000 mg | ORAL_TABLET | Freq: Every day | ORAL | Status: DC
  Administered 2022-05-27: 100 mg via ORAL
  Filled 2022-05-27: qty 2

## 2022-05-27 MED ORDER — ALPRAZOLAM 0.5 MG PO TABS
1.0000 mg | ORAL_TABLET | Freq: Three times a day (TID) | ORAL | Status: DC | PRN
Start: 2022-05-27 — End: 2022-05-28
  Administered 2022-05-27 – 2022-05-28 (×2): 1 mg via ORAL
  Filled 2022-05-27 (×2): qty 2

## 2022-05-27 MED ORDER — BUTALBITAL-APAP-CAFFEINE 50-325-40 MG PO TABS
1.0000 | ORAL_TABLET | Freq: Two times a day (BID) | ORAL | Status: DC | PRN
  Administered 2022-05-27 – 2022-05-28 (×3): 1 via ORAL
  Filled 2022-05-27 (×4): qty 1

## 2022-05-27 MED ORDER — OXYCODONE HCL 5 MG PO TABS
5.0000 mg | ORAL_TABLET | ORAL | Status: DC | PRN
  Administered 2022-05-28 (×3): 5 mg via ORAL
  Filled 2022-05-27 (×3): qty 1

## 2022-05-27 MED ORDER — POTASSIUM CHLORIDE ER 10 MEQ PO TBCR
10.0000 meq | EXTENDED_RELEASE_TABLET | Freq: Two times a day (BID) | ORAL | Status: DC
  Administered 2022-05-27 – 2022-05-28 (×2): 10 meq via ORAL
  Filled 2022-05-27 (×8): qty 1

## 2022-05-27 NOTE — ED Triage Notes (Addendum)
Pt to ED via [facility] complaining of generalized pain. Pt seen here multiple times for the same. Pt has a pain regimen at home but states no relief.

## 2022-05-27 NOTE — ED Provider Notes (Signed)
[facility] Provider Note   CSN: [id number] Arrival date & time: 05/27/22  1900     History No chief complaint on file.   Jamie [name] is a 67 y.o. female.  HPI Patient is a 66 year old female who presents to the emergency department due to chronic pain.  Patient states she has a history of chronic pain and is on chronic opiates.  She was recently seen in the emergency department on May 19 and was evaluated for failure to thrive.  She has a history of chronic alcohol abuse as well.  She has been having falls and injured her right wrist and sustained a fracture and is currently splinted.  She was transferred to a SNF but states that "she felt better" and left the facility.  Since leaving her pain is worsened.  She reports frequent falls and difficulty with ADLs.  States that she most recently fell earlier today and believes that she hit her head.    Home Medications Prior to Admission medications   Medication Sig Start Date End Date Taking? Authorizing Provider  ALPRAZolam Duanne Moron) 1 MG tablet Take 1 tablet (1 mg total) by mouth 3 (three) times daily as needed for anxiety or sleep. 05/17/22   Daleen Bo, MD  butalbital-acetaminophen-caffeine (FIORICET) [phone] MG tablet Take 1-2 tablets by mouth 2 (two) times daily as needed for headache. 05/17/22   Daleen Bo, MD  loperamide (IMODIUM) 2 MG capsule Take 2 mg by mouth every 6 (six) hours as needed. 05/09/22   [provider]  OVER THE COUNTER MEDICATION calcium    [provider]  oxyCODONE (ROXICODONE) 5 MG immediate release tablet Take 1 tablet (5 mg total) by mouth every 4 (four) hours as needed for severe pain. 05/17/22   Daleen Bo, MD  oxyCODONE 10 MG TABS Take 1 tablet (10 mg total) by mouth every 6 (six) hours as needed for severe pain. 05/25/22   Dorie Rank, MD  potassium chloride (KLOR-CON) 10 MEQ tablet Take 1 tablet (10 mEq total) by mouth 2 (two) times daily for 7 days. Patient not  taking: Reported on 04/06/2022 05/19/21 04/06/22  Dorie Rank, MD  traZODone (DESYREL) 100 MG tablet Take 100 mg by mouth at bedtime. 03/17/22   [provider]      Allergies    Amoxicillin-pot clavulanate, Azithromycin, Bupropion, Nsaids, Prednisone, and Vilazodone    Review of Systems   Review of Systems  All other systems reviewed and are negative. Ten systems reviewed and are negative for acute change, except as noted in the HPI.   Physical Exam Updated Vital Signs BP (!) 105/57 (BP Location: Left Arm)   Pulse 73   Temp 98.2 F (36.8 C) (Oral)   Resp 19   Ht $R'5\' 5"'dz$  (1.651 m)   Wt 55 kg   SpO2 100%   BMI 20.18 kg/m  Physical Exam Vitals and nursing note reviewed.  Constitutional:      General: She is not in acute distress.    Appearance: She is not ill-appearing, toxic-appearing or diaphoretic.     Comments: Disheveled appearing.  Dried food noted on her shirt.  HENT:     Head: Normocephalic and atraumatic.     Right Ear: External ear normal.     Left Ear: External ear normal.     Nose: Nose normal.     Mouth/Throat:     Mouth: Mucous membranes are moist.     Pharynx: Oropharynx is clear. No oropharyngeal exudate or  posterior oropharyngeal erythema.  Eyes:     Extraocular Movements: Extraocular movements intact.  Cardiovascular:     Rate and Rhythm: Normal rate and regular rhythm.     Pulses: Normal pulses.     Heart sounds: Normal heart sounds. No murmur heard.   No friction rub. No gallop.  Pulmonary:     Effort: Pulmonary effort is normal. No respiratory distress.     Breath sounds: Normal breath sounds. No stridor. No wheezing, rhonchi or rales.  Abdominal:     General: Abdomen is flat.     Tenderness: There is no abdominal tenderness.  Musculoskeletal:        General: Normal range of motion.     Cervical back: Normal range of motion and neck supple. No tenderness.     Comments: Splint noted to the right arm.  Skin:    General: Skin is warm and dry.   Neurological:     General: No focal deficit present.     Mental Status: She is alert and oriented to person, place, and time.  Psychiatric:        Mood and Affect: Mood normal.        Behavior: Behavior normal.   ED Results / Procedures / Treatments   Labs (all labs ordered are listed, but only abnormal results are displayed) Labs Reviewed  COMPREHENSIVE METABOLIC PANEL - Abnormal; Notable for the following components:      Result Value   Total Protein 6.3 (*)    AST 72 (*)    ALT 83 (*)    Alkaline Phosphatase 187 (*)    All other components within normal limits  CBC WITH DIFFERENTIAL/PLATELET    EKG None  Radiology No results found.  Procedures Procedures   Medications Ordered in ED Medications  butalbital-acetaminophen-caffeine (FIORICET) 50-325-40 MG per tablet 1-2 tablet (1 tablet Oral Given 05/27/22 2058)  ALPRAZolam Duanne Moron) tablet 1 mg (1 mg Oral Given 05/27/22 2058)  loperamide (IMODIUM) capsule 2 mg (2 mg Oral Given 05/27/22 2058)  oxyCODONE (Oxy IR/ROXICODONE) immediate release tablet 5 mg (has no administration in time range)  potassium chloride (KLOR-CON) CR tablet 10 mEq (has no administration in time range)  traZODone (DESYREL) tablet 100 mg (has no administration in time range)  oxyCODONE (Oxy IR/ROXICODONE) immediate release tablet 10 mg (10 mg Oral Given 05/27/22 2018)   ED Course/ Medical Decision Making/ A&P                           Medical Decision Making Amount and/or Complexity of Data Reviewed Labs: ordered. Radiology: ordered.  Risk Prescription drug management.  Pt is a 66 y.o. female who presents to the emergency department due to acute on chronic pain as well as frequent falls.  Please see HPI above for additional information.  Labs: CBC without abnormalities. CMP with a total protein of 6.3, AST of 72, ALT of 83, alk phos of 187.  Imaging: CT scan of the head without contrast is pending.  I, Rayna Sexton, PA-C, personally reviewed  and evaluated these images and lab results as part of my medical decision-making.  Patient reports new falls earlier today and has been having frequent falls since leaving her skilled nursing facility.  States that she is having difficulty with her ADLs.  Appears disheveled and has dried food on her shirt.  Does not appear to be caring for herself properly.  Obtained basic lab work as well as a CT scan  of her head.  CT scan that is pending.  Feel that patient would benefit from [facility] evaluation as well as possible replacement into a skilled nursing facility.  She is agreeable with this plan.  TOC consult has been placed.  Home meds have been ordered.  Patient will board in the ED pending TOC evaluation.  Patient care is being transferred overnight to Dr. Karle Starch.  Note: Portions of this report may have been transcribed using voice recognition software. Every effort was made to ensure accuracy; however, inadvertent computerized transcription errors may be present.  Final Clinical Impression(s) / ED Diagnoses Final diagnoses:  Failure to thrive in adult   Rx / DC Orders ED Discharge Orders     None         Rayna Sexton, PA-C 05/27/22 2235    Davonna Belling, MD 05/28/22 [phone]

## 2022-05-28 NOTE — ED Notes (Signed)
Patient requesting something for her headache. Patient unable to have ordered PRN medication for headache at this time. Patient and EDP made aware. Patient also requesting to go home. Patient made aware of the importance and need to continue care here due to her living alone and having multiple falls. Patient verbalized understanding and still requesting to go home. EDP aware.

## 2022-05-28 NOTE — ED Provider Notes (Signed)
8:03 AM Patient awake, alert, sitting upright.  She requests discharge.  She has capacity to make this request, and he was accommodated.  I discussed the importance of following up with her physicians as an outpatient, and she was agreeable to this.   Gerhard Munch, MD 05/28/22 [phone]

## 2022-05-28 NOTE — ED Notes (Signed)
Pt brief changed Pt placed back in hallway

## 2022-05-28 NOTE — Discharge Instructions (Addendum)
It is very important that you follow-up with your orthopedist.  Please call on Monday for the next available appointment.  Return here for concerning changes in your condition.

## 2022-05-28 NOTE — ED Notes (Addendum)
Pt is asking for RN multiple times due to not getting her full 10mg  dose of oxycodone. Pt stated that we are not taking care of her properly because she is only getting 5mg  instead of 10. EDP made aware and talked with pt and told her that she is now getting 5mg  due to her recent falls that are being caused by increased amount of pain medication. Pt stating that she wants to leave and EDP told pt that she could as long as she is able to walk out of her by herself and has a ride home.

## 2022-05-28 NOTE — ED Provider Notes (Signed)
After sleeping soundly for several hours patient has woken up and is expressing frustration that she is not getting as much narcotic pain medication as she wants. Was on oxycodone 10mg  for several months but recently transitioned to 5mg  after she was at a SNF in [place]. She was advised that she does not have an emergent indication for that dose of oxycodone. She then states she wants to leave. She was informed she would need to demonstrate she can walk safely and get a ride home before she could safely leave. She has been on the phone but as of yet, has not gotten a ride home.    Jamie Savoy, MD 05/28/22 [phone]

## 2022-05-28 NOTE — ED Notes (Signed)
EDP talking to patient about discharge and follow-up care. Patient requesting Xanax before leaving. VSS.

## 2022-06-02 ENCOUNTER — Ambulatory Visit (INDEPENDENT_AMBULATORY_CARE_PROVIDER_SITE_OTHER): Payer: No Typology Code available for payment source | Admitting: Gastroenterology

## 2022-07-18 ENCOUNTER — Emergency Department (HOSPITAL_COMMUNITY): Payer: Medicare HMO

## 2022-07-18 ENCOUNTER — Other Ambulatory Visit: Payer: Self-pay

## 2022-07-18 ENCOUNTER — Inpatient Hospital Stay (HOSPITAL_COMMUNITY)
Admission: EM | Admit: 2022-07-18 | Discharge: 2022-07-22 | DRG: 101 | Disposition: A | Discharge status: Home Health | Payer: Medicare HMO | Attending: Internal Medicine | Admitting: Internal Medicine

## 2022-07-18 ENCOUNTER — Encounter (HOSPITAL_COMMUNITY): Payer: Self-pay

## 2022-07-18 DIAGNOSIS — E878 Other disorders of electrolyte and fluid balance, not elsewhere classified: Secondary | ICD-10-CM | POA: Diagnosis present

## 2022-07-18 DIAGNOSIS — F1010 Alcohol abuse, uncomplicated: Secondary | ICD-10-CM | POA: Diagnosis present

## 2022-07-18 DIAGNOSIS — Z888 Allergy status to other drugs, medicaments and biological substances status: Secondary | ICD-10-CM | POA: Diagnosis not present

## 2022-07-18 DIAGNOSIS — M4802 Spinal stenosis, cervical region: Secondary | ICD-10-CM | POA: Diagnosis present

## 2022-07-18 DIAGNOSIS — F1120 Opioid dependence, uncomplicated: Secondary | ICD-10-CM | POA: Diagnosis present

## 2022-07-18 DIAGNOSIS — N390 Urinary tract infection, site not specified: Secondary | ICD-10-CM | POA: Diagnosis present

## 2022-07-18 DIAGNOSIS — Z79899 Other long term (current) drug therapy: Secondary | ICD-10-CM | POA: Diagnosis not present

## 2022-07-18 DIAGNOSIS — L89151 Pressure ulcer of sacral region, stage 1: Secondary | ICD-10-CM | POA: Diagnosis present

## 2022-07-18 DIAGNOSIS — R451 Restlessness and agitation: Secondary | ICD-10-CM | POA: Diagnosis present

## 2022-07-18 DIAGNOSIS — Z886 Allergy status to analgesic agent status: Secondary | ICD-10-CM | POA: Diagnosis not present

## 2022-07-18 DIAGNOSIS — F1320 Sedative, hypnotic or anxiolytic dependence, uncomplicated: Secondary | ICD-10-CM | POA: Diagnosis present

## 2022-07-18 DIAGNOSIS — E162 Hypoglycemia, unspecified: Secondary | ICD-10-CM | POA: Diagnosis present

## 2022-07-18 DIAGNOSIS — E871 Hypo-osmolality and hyponatremia: Secondary | ICD-10-CM | POA: Diagnosis present

## 2022-07-18 DIAGNOSIS — G43909 Migraine, unspecified, not intractable, without status migrainosus: Secondary | ICD-10-CM | POA: Diagnosis present

## 2022-07-18 DIAGNOSIS — F419 Anxiety disorder, unspecified: Secondary | ICD-10-CM | POA: Diagnosis present

## 2022-07-18 DIAGNOSIS — Z881 Allergy status to other antibiotic agents status: Secondary | ICD-10-CM | POA: Diagnosis not present

## 2022-07-18 DIAGNOSIS — Z91148 Patient's other noncompliance with medication regimen for other reason: Secondary | ICD-10-CM

## 2022-07-18 DIAGNOSIS — G4700 Insomnia, unspecified: Secondary | ICD-10-CM | POA: Diagnosis present

## 2022-07-18 DIAGNOSIS — Z880 Allergy status to penicillin: Secondary | ICD-10-CM

## 2022-07-18 DIAGNOSIS — R569 Unspecified convulsions: Secondary | ICD-10-CM | POA: Diagnosis not present

## 2022-07-18 DIAGNOSIS — E876 Hypokalemia: Secondary | ICD-10-CM | POA: Diagnosis present

## 2022-07-18 DIAGNOSIS — R4182 Altered mental status, unspecified: Principal | ICD-10-CM

## 2022-07-18 DIAGNOSIS — Z91199 Patient's noncompliance with other medical treatment and regimen due to unspecified reason: Secondary | ICD-10-CM

## 2022-07-18 DIAGNOSIS — G40909 Epilepsy, unspecified, not intractable, without status epilepticus: Secondary | ICD-10-CM | POA: Diagnosis present

## 2022-07-18 DIAGNOSIS — R627 Adult failure to thrive: Secondary | ICD-10-CM | POA: Diagnosis present

## 2022-07-18 DIAGNOSIS — Z681 Body mass index (BMI) 19 or less, adult: Secondary | ICD-10-CM

## 2022-07-18 DIAGNOSIS — G894 Chronic pain syndrome: Secondary | ICD-10-CM | POA: Diagnosis present

## 2022-07-18 DIAGNOSIS — R296 Repeated falls: Secondary | ICD-10-CM | POA: Diagnosis present

## 2022-07-18 LAB — COMPREHENSIVE METABOLIC PANEL
ALT: 22 U/L (ref 0–44)
AST: 13 U/L — ABNORMAL LOW (ref 15–41)
Albumin: 3.5 g/dL (ref 3.5–5.0)
Alkaline Phosphatase: 210 U/L — ABNORMAL HIGH (ref 38–126)
Anion gap: 10 (ref 5–15)
BUN: 5 mg/dL — ABNORMAL LOW (ref 8–23)
CO2: 27 mmol/L (ref 22–32)
Calcium: 9.6 mg/dL (ref 8.9–10.3)
Chloride: 94 mmol/L — ABNORMAL LOW (ref 98–111)
Creatinine, Ser: 0.44 mg/dL (ref 0.44–1.00)
GFR, Estimated: >60 mL/min (ref >60)
Glucose, Bld: 111 mg/dL — ABNORMAL HIGH (ref 70–99)
Potassium: 2.8 mmol/L — ABNORMAL LOW (ref 3.5–5.1)
Sodium: 131 mmol/L — ABNORMAL LOW (ref 135–145)
Total Bilirubin: 0.7 mg/dL (ref 0.3–1.2)
Total Protein: 7 g/dL (ref 6.5–8.1)

## 2022-07-18 LAB — CBC WITH DIFFERENTIAL/PLATELET
Abs Immature Granulocytes: 0.16 10*3/uL — ABNORMAL HIGH (ref 0.00–0.07)
Basophils Absolute: 0.1 10*3/uL (ref 0.0–0.1)
Basophils Relative: 0 %
Eosinophils Absolute: 0.1 10*3/uL (ref 0.0–0.5)
Eosinophils Relative: 0 %
HCT: 43.1 % (ref 36.0–46.0)
Hemoglobin: 14.6 g/dL (ref 12.0–15.0)
Immature Granulocytes: 1 %
Lymphocytes Relative: 7 %
Lymphs Abs: 1.5 10*3/uL (ref 0.7–4.0)
MCH: 29.6 pg (ref 26.0–34.0)
MCHC: 33.9 g/dL (ref 30.0–36.0)
MCV: 87.4 fL (ref 80.0–100.0)
Monocytes Absolute: 1.3 10*3/uL — ABNORMAL HIGH (ref 0.1–1.0)
Monocytes Relative: 6 %
Neutro Abs: 19 10*3/uL — ABNORMAL HIGH (ref 1.7–7.7)
Neutrophils Relative %: 86 %
Platelets: 547 10*3/uL — ABNORMAL HIGH (ref 150–400)
RBC: 4.93 MIL/uL (ref 3.87–5.11)
RDW: 13.2 % (ref 11.5–15.5)
WBC: 22 10*3/uL — ABNORMAL HIGH (ref 4.0–10.5)
nRBC: 0 % (ref 0.0–0.2)

## 2022-07-18 LAB — URINALYSIS, ROUTINE W REFLEX MICROSCOPIC
Bilirubin Urine: NEGATIVE
Glucose, UA: NEGATIVE mg/dL
Ketones, ur: 20 mg/dL — AB
Nitrite: NEGATIVE
Protein, ur: 100 mg/dL — AB
Specific Gravity, Urine: 1.017 (ref 1.005–1.030)
WBC, UA: >50 WBC/hpf — ABNORMAL HIGH (ref 0–5)
pH: 7 (ref 5.0–8.0)

## 2022-07-18 LAB — ETHANOL: Alcohol, Ethyl (B): <10 mg/dL (ref <10)

## 2022-07-18 LAB — CK: Total CK: 29 U/L — ABNORMAL LOW (ref 38–234)

## 2022-07-18 LAB — RAPID URINE DRUG SCREEN, HOSP PERFORMED
Amphetamines: NOT DETECTED
Barbiturates: POSITIVE — AB
Benzodiazepines: POSITIVE — AB
Cocaine: NOT DETECTED
Opiates: NOT DETECTED
Tetrahydrocannabinol: NOT DETECTED

## 2022-07-18 LAB — LACTIC ACID, PLASMA
Lactic Acid, Venous: 2.3 mmol/L (ref 0.5–1.9)
Lactic Acid, Venous: 1.7 mmol/L (ref 0.5–1.9)

## 2022-07-18 LAB — MAGNESIUM: Magnesium: 1.6 mg/dL — ABNORMAL LOW (ref 1.7–2.4)

## 2022-07-18 MED ORDER — LORAZEPAM 2 MG/ML IJ SOLN
1.0000 mg | INTRAMUSCULAR | Status: DC | PRN
  Administered 2022-07-19: 1 mg via INTRAVENOUS
  Filled 2022-07-18: qty 1

## 2022-07-18 MED ORDER — LORAZEPAM 2 MG/ML IJ SOLN
2.0000 mg | Freq: Once | INTRAMUSCULAR | Status: AC
  Administered 2022-07-18: 2 mg via INTRAVENOUS

## 2022-07-18 MED ORDER — DEXTROSE-NACL 5-0.9 % IV SOLN
INTRAVENOUS | Status: DC
  Administered 2022-07-19: 75 mL/h via INTRAVENOUS

## 2022-07-18 MED ORDER — POLYETHYLENE GLYCOL 3350 17 G PO PACK
17.0000 g | PACK | Freq: Every day | ORAL | Status: DC | PRN
Start: 2022-07-18 — End: 2022-07-23

## 2022-07-18 MED ORDER — SODIUM CHLORIDE 0.9% FLUSH
3.0000 mL | Freq: Two times a day (BID) | INTRAVENOUS | Status: DC
  Administered 2022-07-19: 3 mL via INTRAVENOUS

## 2022-07-18 MED ORDER — LORAZEPAM 2 MG/ML IJ SOLN
INTRAMUSCULAR | Status: AC
  Administered 2022-07-18: 1 mg via INTRAVENOUS
  Filled 2022-07-18: qty 1

## 2022-07-18 MED ORDER — HEPARIN SODIUM (PORCINE) 5000 UNIT/ML IJ SOLN
5000.0000 [IU] | Freq: Three times a day (TID) | INTRAMUSCULAR | Status: DC
Start: 2022-07-18 — End: 2022-07-23
  Administered 2022-07-18 – 2022-07-22 (×12): 5000 [IU] via SUBCUTANEOUS
  Filled 2022-07-18 (×12): qty 1

## 2022-07-18 MED ORDER — SODIUM CHLORIDE 0.9 % IV SOLN
2.0000 g | INTRAVENOUS | Status: DC
  Administered 2022-07-19 – 2022-07-22 (×4): 2 g via INTRAVENOUS
  Filled 2022-07-18 (×4): qty 20

## 2022-07-18 MED ORDER — ONDANSETRON HCL 4 MG/2ML IJ SOLN
4.0000 mg | Freq: Once | INTRAMUSCULAR | Status: DC
  Filled 2022-07-18: qty 2

## 2022-07-18 MED ORDER — LORAZEPAM 2 MG/ML IJ SOLN
INTRAMUSCULAR | Status: AC
  Filled 2022-07-18: qty 1

## 2022-07-18 MED ORDER — ONDANSETRON HCL 4 MG/2ML IJ SOLN: 4.0000 mg | Freq: Four times a day (QID) | INTRAMUSCULAR | Status: DC | PRN

## 2022-07-18 MED ORDER — POTASSIUM CHLORIDE CRYS ER 20 MEQ PO TBCR: 40.0000 meq | EXTENDED_RELEASE_TABLET | Freq: Once | ORAL | Status: DC

## 2022-07-18 MED ORDER — ONDANSETRON HCL 4 MG PO TABS: 4.0000 mg | ORAL_TABLET | Freq: Four times a day (QID) | ORAL | Status: DC | PRN

## 2022-07-18 MED ORDER — ACETAMINOPHEN 650 MG RE SUPP: 650.0000 mg | Freq: Four times a day (QID) | RECTAL | Status: DC | PRN

## 2022-07-18 MED ORDER — POTASSIUM CHLORIDE 10 MEQ/100ML IV SOLN
10.0000 meq | INTRAVENOUS | Status: AC
  Administered 2022-07-18 – 2022-07-19 (×4): 10 meq via INTRAVENOUS
  Filled 2022-07-18 (×4): qty 100

## 2022-07-18 MED ORDER — SODIUM CHLORIDE 0.9 % IV SOLN
1.0000 g | Freq: Once | INTRAVENOUS | Status: AC
  Administered 2022-07-18: 1 g via INTRAVENOUS
  Filled 2022-07-18: qty 10

## 2022-07-18 MED ORDER — SODIUM CHLORIDE 0.9% FLUSH
3.0000 mL | INTRAVENOUS | Status: DC | PRN
Start: 2022-07-18 — End: 2022-07-23

## 2022-07-18 MED ORDER — SODIUM CHLORIDE 0.9 % IV SOLN: INTRAVENOUS | Status: DC | PRN

## 2022-07-18 MED ORDER — LORAZEPAM 2 MG/ML IJ SOLN: 1.0000 mg | Freq: Once | INTRAMUSCULAR | Status: AC

## 2022-07-18 MED ORDER — SODIUM CHLORIDE 0.9% FLUSH
3.0000 mL | Freq: Two times a day (BID) | INTRAVENOUS | Status: DC
  Administered 2022-07-19 – 2022-07-22 (×5): 3 mL via INTRAVENOUS

## 2022-07-18 MED ORDER — ACETAMINOPHEN 325 MG PO TABS
650.0000 mg | ORAL_TABLET | Freq: Four times a day (QID) | ORAL | Status: DC | PRN
  Administered 2022-07-19 – 2022-07-22 (×2): 650 mg via ORAL
  Filled 2022-07-18 (×3): qty 2

## 2022-07-18 MED ORDER — OXYCODONE HCL 5 MG PO TABS
10.0000 mg | ORAL_TABLET | Freq: Two times a day (BID) | ORAL | Status: DC | PRN
  Administered 2022-07-19 – 2022-07-20 (×3): 10 mg via ORAL
  Filled 2022-07-18 (×4): qty 2

## 2022-07-18 MED ORDER — ACETAMINOPHEN 650 MG RE SUPP
650.0000 mg | Freq: Once | RECTAL | Status: AC
  Administered 2022-07-18: 650 mg via RECTAL
  Filled 2022-07-18: qty 1

## 2022-07-18 MED ORDER — POTASSIUM CHLORIDE 10 MEQ/100ML IV SOLN
10.0000 meq | INTRAVENOUS | Status: AC
  Administered 2022-07-18 (×3): 10 meq via INTRAVENOUS
  Filled 2022-07-18 (×3): qty 100

## 2022-07-18 MED ORDER — BISACODYL 10 MG RE SUPP: 10.0000 mg | Freq: Every day | RECTAL | Status: DC | PRN

## 2022-07-18 MED ORDER — LORAZEPAM 2 MG/ML IJ SOLN
1.0000 mg | Freq: Once | INTRAMUSCULAR | Status: AC
  Administered 2022-07-18: 1 mg via INTRAVENOUS
  Filled 2022-07-18: qty 1

## 2022-07-18 MED ORDER — PANTOPRAZOLE SODIUM 40 MG PO TBEC
40.0000 mg | DELAYED_RELEASE_TABLET | Freq: Every day | ORAL | Status: DC
  Administered 2022-07-19 – 2022-07-22 (×4): 40 mg via ORAL
  Filled 2022-07-18 (×4): qty 1

## 2022-07-18 MED ORDER — FENTANYL CITRATE PF 50 MCG/ML IJ SOSY
25.0000 ug | PREFILLED_SYRINGE | INTRAMUSCULAR | Status: DC | PRN
  Administered 2022-07-18 – 2022-07-19 (×3): 25 ug via INTRAVENOUS
  Filled 2022-07-18 (×3): qty 1

## 2022-07-18 MED ORDER — BUTALBITAL-APAP-CAFFEINE 50-325-40 MG PO TABS
2.0000 | ORAL_TABLET | Freq: Once | ORAL | Status: DC
  Filled 2022-07-18: qty 2

## 2022-07-18 MED ORDER — LEVETIRACETAM IN NACL 1000 MG/100ML IV SOLN
1000.0000 mg | Freq: Once | INTRAVENOUS | Status: AC
  Administered 2022-07-18: 1000 mg via INTRAVENOUS
  Filled 2022-07-18: qty 100

## 2022-07-18 MED ORDER — LORAZEPAM 2 MG/ML IJ SOLN
1.0000 mg | Freq: Four times a day (QID) | INTRAMUSCULAR | Status: DC | PRN
  Administered 2022-07-18: 1 mg via INTRAVENOUS
  Filled 2022-07-18: qty 1

## 2022-07-18 MED ORDER — TRAZODONE HCL 50 MG PO TABS
50.0000 mg | ORAL_TABLET | Freq: Every evening | ORAL | Status: DC | PRN
  Administered 2022-07-19 – 2022-07-21 (×4): 50 mg via ORAL
  Filled 2022-07-18 (×4): qty 1

## 2022-07-18 MED ORDER — FENTANYL CITRATE PF 50 MCG/ML IJ SOSY
PREFILLED_SYRINGE | INTRAMUSCULAR | Status: AC
  Filled 2022-07-18: qty 1

## 2022-07-18 NOTE — ED Notes (Signed)
Daughter called to get a update. CNA called EMS for seizure. Hilda Lias is the CNA with [employer] and [employer] disability  [phone]. Monday and Friday. Daughter states pt abuses her pain medications and drinks alcohol and smokes weed.  Hilda Lias states pt had a seizure and would not come out of it. Stiff and jerking, arms, legs and eyes. Hilda Lias states pt stayed stiff 5-10 minutes Fabio Wah.

## 2022-07-18 NOTE — ED Notes (Signed)
Pt continues to be confused, pulling at staff, objects and trying to get out of bed. Redirection not effective due to confusion .

## 2022-07-18 NOTE — ED Notes (Signed)
Attempting mitts off pt to see if she will not pull at wires

## 2022-07-18 NOTE — ED Triage Notes (Signed)
Per patient's daughter patient has been refusing to take her antibiotics for her UTI because it makes her sick to her stomach. Patient states she is nauseous and has a migraine.

## 2022-07-18 NOTE — ED Notes (Signed)
Pt continues tom fight and try to bite

## 2022-07-18 NOTE — ED Notes (Signed)
Pt combative , pulling at nasal cannula, attempting to bite nurse, scratching at nurse. Mitts applied and pt is trying to bite at mitts.

## 2022-07-18 NOTE — ED Provider Notes (Signed)
[facility] EMERGENCY DEPARTMENT Provider Note   CSN: [id number] Arrival date & time: 07/18/22  1356     History {Add pertinent medical, surgical, social history, OB history to HPI:1} Chief Complaint  Patient presents with   Urinary Tract Infection    Jamie [name] is a 66 y.o. female.  She has a history of chronic pain benzo opiate use alcohol use.   She was brought in by ambulance for possible seizure activity.  Patient was reportedly being given a bath by her CNA when she became unresponsive and stiff.  This lasted about 10 minutes.  No prior history of seizure disorder.  Patient currently complaining of migraine headache that started over the weekend.  She is also complaining of some nausea and some chronic pain in her joints that she takes hydrocodone for.  She said she has not had any in a few weeks and needs to talk to her prescribing provider in [place].  She denies seizure activity.  The history is provided by the patient.  Headache Pain location:  Generalized Quality:  Dull Severity currently:  3/10 Severity at highest:  3/10 Onset quality:  Gradual Duration:  3 days Timing:  Intermittent Progression:  Unchanged Chronicity:  Recurrent Similar to prior headaches: yes   Relieved by:  None tried Worsened by:  Nothing Ineffective treatments:  None tried Associated symptoms: back pain, nausea and seizures   Associated symptoms: no abdominal pain, no cough, no diarrhea, no fever, no visual change and no vomiting        Home Medications Prior to Admission medications   Medication Sig Start Date End Date Taking? Authorizing Provider  ALPRAZolam Prudy Feeler) 1 MG tablet Take 1 tablet (1 mg total) by mouth 3 (three) times daily as needed for anxiety or sleep. 05/17/22   Mancel Bale, MD  butalbital-acetaminophen-caffeine (FIORICET) [phone] MG tablet Take 1-2 tablets by mouth 2 (two) times daily as needed for headache. 05/17/22   Mancel Bale, MD  loperamide (IMODIUM) 2 MG  capsule Take 2 mg by mouth every 6 (six) hours as needed. 05/09/22   [provider]  OVER THE COUNTER MEDICATION calcium    [provider]  oxyCODONE (ROXICODONE) 5 MG immediate release tablet Take 1 tablet (5 mg total) by mouth every 4 (four) hours as needed for severe pain. 05/17/22   Mancel Bale, MD  oxyCODONE 10 MG TABS Take 1 tablet (10 mg total) by mouth every 6 (six) hours as needed for severe pain. 05/25/22   Linwood Dibbles, MD  potassium chloride (KLOR-CON) 10 MEQ tablet Take 1 tablet (10 mEq total) by mouth 2 (two) times daily for 7 days. Patient not taking: Reported on 04/06/2022 05/19/21 04/06/22  Linwood Dibbles, MD  traZODone (DESYREL) 100 MG tablet Take 100 mg by mouth at bedtime. 03/17/22   [provider]      Allergies    Amoxicillin-pot clavulanate, Azithromycin, Bupropion, Nsaids, Prednisone, and Vilazodone    Review of Systems   Review of Systems  Constitutional:  Negative for fever.  Respiratory:  Negative for cough.   Gastrointestinal:  Positive for nausea. Negative for abdominal pain, diarrhea and vomiting.  Musculoskeletal:  Positive for back pain.  Neurological:  Positive for seizures and headaches.    Physical Exam Updated Vital Signs BP (!) 160/94   Pulse 85   Temp 98.1 F (36.7 C) (Oral)   Resp 17   Ht 5\' 5"  (1.651 m)   Wt 51 kg   SpO2 100%   BMI  18.70 kg/m  Physical Exam Vitals and nursing note reviewed.  Constitutional:      General: She is not in acute distress.    Appearance: Normal appearance. She is well-developed.  HENT:     Head: Normocephalic and atraumatic.  Eyes:     Conjunctiva/sclera: Conjunctivae normal.  Cardiovascular:     Rate and Rhythm: Normal rate and regular rhythm.     Heart sounds: No murmur heard. Pulmonary:     Effort: Pulmonary effort is normal. No respiratory distress.     Breath sounds: Normal breath sounds.  Abdominal:     Palpations: Abdomen is soft.     Tenderness: There is no abdominal  tenderness. There is no guarding or rebound.  Musculoskeletal:        General: Normal range of motion.     Cervical back: Neck supple.     Right lower leg: No edema.     Left lower leg: No edema.  Skin:    General: Skin is warm and dry.     Capillary Refill: Capillary refill takes less than 2 seconds.  Neurological:     General: No focal deficit present.     Mental Status: She is alert.     Sensory: No sensory deficit.     Motor: No weakness.     ED Results / Procedures / Treatments   Labs (all labs ordered are listed, but only abnormal results are displayed) Labs Reviewed  COMPREHENSIVE METABOLIC PANEL  ETHANOL  CBC WITH DIFFERENTIAL/PLATELET  URINALYSIS, ROUTINE W REFLEX MICROSCOPIC  RAPID URINE DRUG SCREEN, HOSP PERFORMED    EKG None  Radiology No results found.  Procedures Procedures  {Document cardiac monitor, telemetry assessment procedure when appropriate:1}  Medications Ordered in ED Medications  butalbital-acetaminophen-caffeine (FIORICET) 50-325-40 MG per tablet 2 tablet (has no administration in time range)  ondansetron (ZOFRAN) injection 4 mg (has no administration in time range)    ED Course/ Medical Decision Making/ A&P                           Medical Decision Making Amount and/or Complexity of Data Reviewed Labs: ordered. Radiology: ordered.  Risk Prescription drug management.  This patient complains of ***; this involves an extensive number of treatment Options and is a complaint that carries with it a high risk of complications and morbidity. The differential includes ***  I ordered, reviewed and interpreted labs, which included *** I ordered medication *** and reviewed PMP when indicated. I ordered imaging studies which included *** and I independently    visualized and interpreted imaging which showed *** Additional history obtained from *** Previous records obtained and reviewed *** I consulted *** and discussed lab and imaging  findings and discussed disposition.  Cardiac monitoring reviewed, *** Social determinants considered, *** Critical Interventions: ***  After the interventions stated above, I reevaluated the patient and found *** Admission and further testing considered, ***    {Document critical care time when appropriate:1} {Document review of labs and clinical decision tools ie heart score, Chads2Vasc2 etc:1}  {Document your independent review of radiology images, and any outside records:1} {Document your discussion with family members, caretakers, and with consultants:1} {Document social determinants of health affecting pt's care:1} {Document your decision making why or why not admission, treatments were needed:1} Final Clinical Impression(s) / ED Diagnoses Final diagnoses:  None    Rx / DC Orders ED Discharge Orders     None

## 2022-07-18 NOTE — H&P (Addendum)
Patient Demographics:    Jamie [name], is a 66 y.o. female  MRN: [id number]   DOB - [birth year]  Admit Date - 07/18/2022  Outpatient Primary MD for the patient is Christain Sacramento, MD   Assessment & Plan:   Assessment and Plan:  1)Possible Recurrent Seizures--- ???  Opiate or benzo or alcohol withdrawal  -UDS without opiates, no patient is prescribed opiates---??  If oxycodone does not show up on UDS Blood alcohol level negative this time around CT head without acute findings -Get EEG -Notified on-call neurologist Dr. Kerney Elbe at [facility]--- official neurology consult when patient arrives at [facility] -Brain MRI requested -Patient was loaded with Keppra in the ED -Seizure precautions and lorazepam as needed --Defer to neurology service if LP needed (patient has UTI with fevers and leukocytosis)  2)Polysubstance abuse/anxiety disorder /alcohol abuse ---  --UDS positive for benzos and barbiturates -Patient had previously refused rehab   3) sepsis secondary to Proteus UTI--- ---POA--meets sepsis criteria -WBC 22.0 , query partly reactive due to seizures -Patient also has a UTI with fevers, tachycardia and tachypnea -IV Rocephin pending blood and repeat urine culture  4) Proteus mirabilis UTI-patient apparently was diagnosed with UTI at [facility] on 07/16/2022 -Care everywhere review shows Proteus mirabilis from urine culture dated 07/14/2022 -Patient was prescribed Keflex which she did not take at home apparently -She is currently lethargic and unable to take oral intake so we will treat empirically with IV Rocephin -May switch to Keflex when oral intake resumes   5) social/ethics-plan of care and advanced directive discussed with patient's daughter Ms. [name] by [phone] -Patient is a  full code - 6)chronic pain syndrome with prior back surgery, chronically on opiates--be judicious with opiates   7)Hypokalemia/Hyponatremia/Hypochloremia---P-Sodium is 131, potassium is 2.8, chloride is 94, creatinine 0.44 -No vomiting or diarrhea reported -Check magnesium  Disposition/Need for in-Hospital Stay- patient unable to be discharged at this time due to -- Transfer to [facility] for brain MRI and neurology evaluation as well as EEG given concerns for recurrent seizures -Defer to neurology service if LP needed  Status is: Inpatient  Remains inpatient appropriate because:   Dispo: The patient is from: Home              Anticipated d/c is to: Home              Anticipated d/c date is: 3 days              Patient currently is not medically stable to d/c. Barriers: Not Clinically Stable-    With History of - Reviewed by me  Past Medical History:  Diagnosis Date   Anxiety    Arthritis    Chronic pain    Gastroenteritis    Headache    Hypotension    Insomnia    Suicidal ideation       Past Surgical History:  Procedure Laterality Date  BACK SURGERY     CARPAL TUNNEL RELEASE     LAPAROSCOPIC OOPHERECTOMY     MOUTH SURGERY        Chief Complaint  Patient presents with   Urinary Tract Infection      HPI:    Jamie [name]  is a 65 y.o. female  with pmhx relevant for arthralgia, migraine headaches, chronic pain syndrome with prior back surgery, chronically on opiates, anxiety and insomnia chronically on benzodiazepine, alcohol abuse and history of noncompliance , as well as h/o Recurrent Falls/Generalized weakness, recent UTI dxed on 07/16/22 at [facility] who now presents with concerns about seizures.... her CNA/home health care giver at home reports possible seizure episode at home today and pt had another episode of unresponsiveness and code blue was called initially----suspect seizures....had prolonged post-ictal state.....subsequently had agitation and  restlessness and combativeness -  At the time of my evaluation she is sleepy from benzo -Additional H/o obtained patient's daughter Ms. Chelsea Thrasher by [phone] -UDS is positive for benzos and barbiturates ---CT head without acute findings -Chest x-ray without acute findings -UA suggestive of UTI -Blood alcohol level negative -WBC 22.0 hemoglobin 14.6 platelets 547 -Sodium is 131, potassium is 2.8, chloride is 94, creatinine 0.44 -Alk phos is 210 AST and ALT are not elevated,  T. bili is not elevated     Review of systems:    In addition to the HPI above,   A full Review of  Systems was done, all other systems reviewed are negative except as noted above in HPI , .    Social History:  Reviewed by me    Social History   Tobacco Use   Smoking status: Never   Smokeless tobacco: Never  Substance Use Topics   Alcohol use: Not Currently       Family History :  Reviewed by me    Family History  Problem Relation Age of Onset   Colon cancer Neg Hx     Home Medications:   Prior to Admission medications   Medication Sig Start Date End Date Taking? Authorizing Provider  ALPRAZolam Duanne Moron) 1 MG tablet Take 1 tablet (1 mg total) by mouth 3 (three) times daily as needed for anxiety or sleep. 05/17/22  Yes Daleen Bo, MD  Aspirin-Caffeine [phone] MG PACK Take by mouth.   Yes [provider]  butalbital-acetaminophen-caffeine (FIORICET) 50-325-40 MG tablet Take 1-2 tablets by mouth 2 (two) times daily as needed for headache. 05/17/22  Yes Daleen Bo, MD  loperamide (IMODIUM) 2 MG capsule Take 2 mg by mouth every 6 (six) hours as needed. 05/09/22  Yes [provider]  OVER THE COUNTER MEDICATION calcium   Yes [provider]  oxyCODONE 10 MG TABS Take 1 tablet (10 mg total) by mouth every 6 (six) hours as needed for severe pain. 05/25/22  Yes Dorie Rank, MD  pantoprazole (PROTONIX) 40 MG tablet Take 40 mg by mouth daily. 06/21/22  Yes  [provider]  tiZANidine (ZANAFLEX) 4 MG tablet Take 4 mg by mouth 3 (three) times daily. 06/06/22  Yes [provider]  traZODone (DESYREL) 100 MG tablet Take 100 mg by mouth at bedtime. 03/17/22  Yes [provider]  cephALEXin (KEFLEX) 500 MG capsule Take 500 mg by mouth 2 (two) times daily. 07/16/22 07/23/22  [provider]  oxyCODONE (ROXICODONE) 5 MG immediate release tablet Take 1 tablet (5 mg total) by mouth every 4 (four) hours as needed for severe pain. Patient not taking: Reported on  07/18/2022 05/17/22   Daleen Bo, MD  potassium chloride (KLOR-CON) 10 MEQ tablet Take 1 tablet (10 mEq total) by mouth 2 (two) times daily for 7 days. Patient not taking: Reported on 04/06/2022 05/19/21 04/06/22  Dorie Rank, MD     Allergies:     Allergies  Allergen Reactions   Amoxicillin-Pot Clavulanate Nausea And Vomiting    Can tolerate plain Amoxicillin   Azithromycin Nausea And Vomiting    Other reaction(s): Other (See Comments) unknown   Bupropion Nausea And Vomiting   Nsaids Other (See Comments)    Stomach problems   Prednisone Diarrhea   Vilazodone Diarrhea     Physical Exam:   Vitals  Blood pressure 123/78, pulse (!) 104, temperature (!) 100.4 F (38 C), resp. rate (!) 22, height 5' 5" (1.651 m), weight 51 kg, SpO2 100 %.  Physical Examination: General appearance -sleepy after lorazepam  mental status -sleepy after lorazepam Eyes - sclera anicteric Neck - supple, no JVD elevation , Chest - clear  to auscultation bilaterally, symmetrical air movement,  Heart - S1 and S2 normal, regular  Abdomen - soft, nontender, nondistended, +BS Neurological -Limited neuro exam as patient is sleepy after lorazepam, staff reports that patient was moving all extremities previously and was  agitated and combative Extremities - no pedal edema noted, intact peripheral pulses  Skin - warm, dry   Data Review:    CBC Recent Labs  Lab 07/18/22 1625  WBC  22.0*  HGB 14.6  HCT 43.1  PLT 547*  MCV 87.4  MCH 29.6  MCHC 33.9  RDW 13.2  LYMPHSABS 1.5  MONOABS 1.3*  EOSABS 0.1  BASOSABS 0.1   ------------------------------------------------------------------------------------------------------------------  Chemistries  Recent Labs  Lab 07/18/22 1625  NA 131*  K 2.8*  CL 94*  CO2 27  GLUCOSE 111*  BUN 5*  CREATININE 0.44  CALCIUM 9.6  AST 13*  ALT 22  ALKPHOS 210*  BILITOT 0.7   ------------------------------------------------------------------------------------------------------------------ estimated creatinine clearance is 54.9 mL/min (by C-G formula based on SCr of 0.44 mg/dL). ------------------------------------------------------------------------------------------------------------------ No results for input(s): "TSH", "T4TOTAL", "T3FREE", "THYROIDAB" in the last 72 hours.  Invalid input(s): "FREET3"   Coagulation profile No results for input(s): "INR", "PROTIME" in the last 168 hours. ------------------------------------------------------------------------------------------------------------------- No results for input(s): "DDIMER" in the last 72 hours. -------------------------------------------------------------------------------------------------------------------  Cardiac Enzymes No results for input(s): "CKMB", "TROPONINI", "MYOGLOBIN" in the last 168 hours.  Invalid input(s): "CK" ------------------------------------------------------------------------------------------------------------------ No results found for: "BNP"   ---------------------------------------------------------------------------------------------------------------  Urinalysis    Component Value Date/Time   COLORURINE YELLOW 07/18/2022 1905   APPEARANCEUR CLOUDY (A) 07/18/2022 1905   LABSPEC 1.017 07/18/2022 1905   PHURINE 7.0 07/18/2022 1905   GLUCOSEU NEGATIVE 07/18/2022 1905   HGBUR MODERATE (A) 07/18/2022 1905   BILIRUBINUR  NEGATIVE 07/18/2022 1905   KETONESUR 20 (A) 07/18/2022 1905   PROTEINUR 100 (A) 07/18/2022 1905   NITRITE NEGATIVE 07/18/2022 1905   LEUKOCYTESUR TRACE (A) 07/18/2022 1905    ----------------------------------------------------------------------------------------------------------------   Imaging Results:    CT Head Wo Contrast  Result Date: 07/18/2022 CLINICAL DATA:  Worsening headache altered with seizure EXAM: CT HEAD WITHOUT CONTRAST TECHNIQUE: Contiguous axial images were obtained from the base of the skull through the vertex without intravenous contrast. RADIATION DOSE REDUCTION: This exam was performed according to the departmental dose-optimization program which includes automated exposure control, adjustment of the mA and/or kV according to patient size and/or use of iterative reconstruction technique. COMPARISON:  CT brain 05/27/2022 FINDINGS: Brain: No acute territorial infarction, hemorrhage  or intracranial mass. Motion degradation. Atrophy and chronic small vessel ischemic changes of the white matter. Stable ventricle size Vascular: No hyperdense vessels.  No unexpected calcification Skull: Normal. Negative for fracture or focal lesion. Sinuses/Orbits: No acute finding. Other: None IMPRESSION: 1. No CT evidence for acute intracranial abnormality. 2. Atrophy and chronic small vessel ischemic changes of the white matter Electronically Signed   By: Donavan Foil M.D.   On: 07/18/2022 18:43   DG Chest Port 1 View  Result Date: 07/18/2022 CLINICAL DATA:  Syncope EXAM: PORTABLE CHEST 1 VIEW COMPARISON:  05/13/2022 FINDINGS: Cardiac size is within normal limits. There are no signs of pulmonary edema or focal consolidation. Small linear densities in the lower lung fields have not changed, possibly suggesting scarring. There is no pleural effusion or pneumothorax. IMPRESSION: No active disease. Electronically Signed   By: Elmer Picker M.D.   On: 07/18/2022 16:16    Radiological Exams on  Admission: CT Head Wo Contrast  Result Date: 07/18/2022 CLINICAL DATA:  Worsening headache altered with seizure EXAM: CT HEAD WITHOUT CONTRAST TECHNIQUE: Contiguous axial images were obtained from the base of the skull through the vertex without intravenous contrast. RADIATION DOSE REDUCTION: This exam was performed according to the departmental dose-optimization program which includes automated exposure control, adjustment of the mA and/or kV according to patient size and/or use of iterative reconstruction technique. COMPARISON:  CT brain 05/27/2022 FINDINGS: Brain: No acute territorial infarction, hemorrhage or intracranial mass. Motion degradation. Atrophy and chronic small vessel ischemic changes of the white matter. Stable ventricle size Vascular: No hyperdense vessels.  No unexpected calcification Skull: Normal. Negative for fracture or focal lesion. Sinuses/Orbits: No acute finding. Other: None IMPRESSION: 1. No CT evidence for acute intracranial abnormality. 2. Atrophy and chronic small vessel ischemic changes of the white matter Electronically Signed   By: Donavan Foil M.D.   On: 07/18/2022 18:43   DG Chest Port 1 View  Result Date: 07/18/2022 CLINICAL DATA:  Syncope EXAM: PORTABLE CHEST 1 VIEW COMPARISON:  05/13/2022 FINDINGS: Cardiac size is within normal limits. There are no signs of pulmonary edema or focal consolidation. Small linear densities in the lower lung fields have not changed, possibly suggesting scarring. There is no pleural effusion or pneumothorax. IMPRESSION: No active disease. Electronically Signed   By: Elmer Picker M.D.   On: 07/18/2022 16:16    DVT Prophylaxis -SCD/Heparin AM Labs Ordered, also please review Full Orders  Family Communication: Admission, patients condition and plan of care including tests being ordered have been discussed with the patient and daughter [name] who indicate understanding and agree with the plan   Condition    stable  Roxan Hockey M.D on 07/18/2022 at 9:18 PM Go to [email or web address] -  for contact info  [facility] - Office  [phone]

## 2022-07-18 NOTE — ED Notes (Signed)
Pt arrived [facility] with feces on her hands, thighs and legs. Pt groin area is red and open areas to her sacrum. Pt cleaned and cream applied.

## 2022-07-18 NOTE — ED Notes (Signed)
Pt began to whole body stiffin up and eyes blink. Pt turned blue and staff begun to start CPR and ambu bag was applied. Pt pulse noted. EDP at bedside to assess pt. Pt post ictal at this time. Verbal order to d/c fioricet.

## 2022-07-18 NOTE — ED Notes (Signed)
Pt ate through hand mittens so soft wrist restraints were applied. Pt is constantly pulling at wires and cannot be redirected at this time.

## 2022-07-19 ENCOUNTER — Inpatient Hospital Stay (HOSPITAL_COMMUNITY): Payer: Medicare HMO

## 2022-07-19 ENCOUNTER — Inpatient Hospital Stay (HOSPITAL_COMMUNITY)
Admit: 2022-07-19 | Discharge: 2022-07-19 | Disposition: A | Discharge status: Home / Self Care | Payer: Medicare HMO | Attending: Neurology | Admitting: Neurology

## 2022-07-19 DIAGNOSIS — R569 Unspecified convulsions: Secondary | ICD-10-CM | POA: Diagnosis not present

## 2022-07-19 LAB — CBC
HCT: 37.7 % (ref 36.0–46.0)
Hemoglobin: 12.1 g/dL (ref 12.0–15.0)
MCH: 29.7 pg (ref 26.0–34.0)
MCHC: 32.1 g/dL (ref 30.0–36.0)
MCV: 92.6 fL (ref 80.0–100.0)
Platelets: 311 10*3/uL (ref 150–400)
RBC: 4.07 MIL/uL (ref 3.87–5.11)
RDW: 13.2 % (ref 11.5–15.5)
WBC: 13.9 10*3/uL — ABNORMAL HIGH (ref 4.0–10.5)
nRBC: 0 % (ref 0.0–0.2)

## 2022-07-19 LAB — COMPREHENSIVE METABOLIC PANEL
ALT: 17 U/L (ref 0–44)
AST: 15 U/L (ref 15–41)
Albumin: 3.1 g/dL — ABNORMAL LOW (ref 3.5–5.0)
Alkaline Phosphatase: 167 U/L — ABNORMAL HIGH (ref 38–126)
Anion gap: 7 (ref 5–15)
BUN: 5 mg/dL — ABNORMAL LOW (ref 8–23)
CO2: 25 mmol/L (ref 22–32)
Calcium: 9 mg/dL (ref 8.9–10.3)
Chloride: 99 mmol/L (ref 98–111)
Creatinine, Ser: 0.5 mg/dL (ref 0.44–1.00)
GFR, Estimated: >60 mL/min (ref >60)
Glucose, Bld: 89 mg/dL (ref 70–99)
Potassium: 3.6 mmol/L (ref 3.5–5.1)
Sodium: 131 mmol/L — ABNORMAL LOW (ref 135–145)
Total Bilirubin: 0.5 mg/dL (ref 0.3–1.2)
Total Protein: 6.1 g/dL — ABNORMAL LOW (ref 6.5–8.1)

## 2022-07-19 LAB — HIV ANTIBODY (ROUTINE TESTING W REFLEX): HIV Screen 4th Generation wRfx: NONREACTIVE

## 2022-07-19 LAB — CBG MONITORING, ED
Glucose-Capillary: 68 mg/dL — ABNORMAL LOW (ref 70–99)
Glucose-Capillary: 113 mg/dL — ABNORMAL HIGH (ref 70–99)

## 2022-07-19 MED ORDER — ORAL CARE MOUTH RINSE: 15.0000 mL | OROMUCOSAL | Status: DC | PRN

## 2022-07-19 MED ORDER — MAGNESIUM SULFATE 2 GM/50ML IV SOLN
2.0000 g | Freq: Once | INTRAVENOUS | Status: AC
  Administered 2022-07-19: 2 g via INTRAVENOUS
  Filled 2022-07-19: qty 50

## 2022-07-19 NOTE — Progress Notes (Signed)
PROGRESS NOTE    Patient: Jamie [name]                            PCP: Christain Sacramento, MD                    DOB: [birth year]            DOA: 07/18/2022 MYT:[id number]             DOS: 07/19/2022, 10:47 AM   LOS: 1 day   Date of Service: The patient was seen and examined on 07/19/2022  Subjective:   The patient was seen and examined this morning. Hypotensive otherwise hemodynamically stable,  Patient was somnolent and lethargic throughout the night but much more awake alert this morning   Brief Narrative:   Jamie [name]  is a 66 y.o. female  with pmhx relevant for arthralgia, migraine headaches, chronic pain syndrome with prior back surgery, chronically on opiates, anxiety and insomnia chronically on benzodiazepine, alcohol abuse and history of noncompliance , as well as h/o Recurrent Falls/Generalized weakness, recent UTI dxed on 07/16/22 at [facility] who now presents with concerns about seizures.... her CNA/home health care giver at home reports possible seizure episode at home today and pt had another episode of unresponsiveness and code blue was called initially----suspect seizures....had prolonged post-ictal state.....subsequently had agitation and restlessness and combativeness  -Additional H/o obtained patient's daughter Ms. Jamie [name] by [phone] -UDS is positive for benzos and barbiturates ---CT head without acute findings -Chest x-ray without acute findings -UA suggestive of UTI -Blood alcohol level negative -WBC 22.0 hemoglobin 14.6 platelets 547 -Sodium is 131, potassium is 2.8, chloride is 94, creatinine 0.44 -Alk phos is 210 AST and ALT are not elevated,  T. bili is not elevated      Assessment & Plan:   Principal Problem:   Seizure (HCC) Active Problems:   Hypokalemia   Alcohol abuse   Opiate dependence --Chronic pain   Benzodiazepine dependence /Chronic anxiety   Assessment and Plan:   Possible Recurrent Seizures- -Episodes of seizures  overnight, currently stable much more awake alert  -Recurrent seizures possibly related to opiate or benzo or alcohol withdrawal  -UDS without opiates, no patient is prescribed opiates-- -  Oxycodone does not show up on UDS - Blood alcohol level negative this time around - CT head without acute findings - EEG >>> May need continuous EEG--- to be discharged by neurology at [facility]  -Notified on-call neurologist Dr. Kerney Elbe at [facility]--- official neurology consult when patient arrives at [facility] -Brain MRI requested -Patient was loaded with Keppra in the ED -Seizure precautions and lorazepam as needed --Defer to neurology service if LP needed (patient has UTI with fevers and leukocytosis)   Polysubstance abuse/anxiety disorder /alcohol abuse ---  --UDS positive for benzos and barbiturates -Patient had previously refused rehab   Sepsis secondary to Proteus UTI--POA- -Still mildly hypotensive, but much more awake BP (!) 97/57, P 87, temp. 97.8 F (36.6 C), RR 17, , SpO2 100 % on RA   -met sepsis criteria on admission -WBC 22.0 , query partly reactive due to seizures -Patient also has a UTI with fevers, tachycardia and tachypnea -IV Rocephin pending blood and repeat urine culture   Proteus mirabilis UTI -patient apparently was diagnosed with UTI at [facility] on 07/16/2022 -Care everywhere review shows Proteus mirabilis from urine culture dated 07/14/2022 -Patient was prescribed Keflex which she did not  take at home apparently -will treat empirically with IV Rocephin -May be switched back to Keflex on discharge   Social/Ethics -plan of care and advanced directive discussed with patient's daughter Ms. Jamie [name] by [phone] -Patient is a full code  Chronic pain syndrome with prior back surgery, chronically on opiates--be judicious with opiates    Hypokalemia/Hyponatremia/Hypochloremia-- --Sodium is 131, potassium is 2.8, chloride is 94, creatinine 0.44 -No  vomiting or diarrhea reported -Mgnesium    Disposition/Need for in-Hospital Stay-- Transfer to [facility] for brain MRI and neurology evaluation as well as EEG given concerns for recurrent seizures -Defer to neurology service if LP needed      ----------------------------------------------------------------------------------------------------------------------------------------------- Nutritional status:  The patient's BMI is: Body mass index is 18.7 kg/m. I agree with the assessment and plan as outlined Skin Assessment: I have examined the patient's skin and I agree with the wound assessment as performed by wound care team As outlined belowe: Pressure Injury 05/19/21 Sacrum Stage 1 -  Intact skin with non-blanchable redness of a localized area usually over a bony prominence. blanchable redness (Active)  05/19/21 1400  Location: Sacrum  Location Orientation:   Staging: Stage 1 -  Intact skin with non-blanchable redness of a localized area usually over a bony prominence.  Wound Description (Comments): blanchable redness  Present on Admission: Yes   -------------------------------------------------------------------------------------------------------------------------------------------  DVT prophylaxis:  heparin injection 5,000 Units Start: 07/18/22 2200 SCDs Start: 07/18/22 2113 Place TED hose Start: 07/18/22 2113   Code Status:   Code Status: Full Code  Family Communication: No family member present at bedside- attempt will be made to update daily The above findings and plan of care has been discussed with patient (and family)  in detail,  they expressed understanding and agreement of above. -Advance care planning has been discussed.   Admission status:   Status is: Inpatient Remains inpatient appropriate because: Needing close monitoring, further work-up for new onset seizures, encephalopathy.  Neurology evaluation, MRI, EEG     Procedures:   No admission  procedures for hospital encounter.   Antimicrobials:  Anti-infectives (From admission, onward)    Start     Dose/Rate Route Frequency Ordered Stop   07/19/22 1000  cefTRIAXone (ROCEPHIN) 2 g in sodium chloride 0.9 % 100 mL IVPB        2 g 200 mL/hr over 30 Minutes Intravenous Every 24 hours 07/18/22 2032     07/18/22 2015  cefTRIAXone (ROCEPHIN) 1 g in sodium chloride 0.9 % 100 mL IVPB        1 g 200 mL/hr over 30 Minutes Intravenous  Once 07/18/22 2013 07/18/22 2114        Medication:   heparin  5,000 Units Subcutaneous Q8H   ondansetron (ZOFRAN) IV  4 mg Intravenous Once   pantoprazole  40 mg Oral Daily   potassium chloride  40 mEq Oral Once   sodium chloride flush  3 mL Intravenous Q12H   sodium chloride flush  3 mL Intravenous Q12H    sodium chloride, acetaminophen **OR** acetaminophen, bisacodyl, fentaNYL (SUBLIMAZE) injection, LORazepam, ondansetron **OR** ondansetron (ZOFRAN) IV, oxyCODONE, polyethylene glycol, sodium chloride flush, traZODone   Objective:   Vitals:   07/19/22 0630 07/19/22 1012 07/19/22 1014 07/19/22 1030  BP: 127/75 100/61  (!) 97/57  Pulse: 79 90  87  Resp: $Remo'13 15  17  'PbpYC$ Temp: 98.9 F (37.2 C)  97.8 F (36.6 C)   TempSrc: Axillary  Oral   SpO2: 100% 100%  100%  Weight:  Height:        Intake/Output Summary (Last 24 hours) at 07/19/2022 1047 Last data filed at 07/18/2022 2123 Gross per 24 hour  Intake 180.14 ml  Output --  Net 180.14 ml   Filed Weights   07/18/22 1406  Weight: 51 kg     Examination:   Physical Exam  Constitution:  Alert X2,  cooperative, no distress,  Appears calm and comfortable  Psychiatric:   Normal and stable mood and affect, cognition intact,   HEENT:        Normocephalic, PERRL, otherwise with in Normal limits  Chest:         Chest symmetric Cardio vascular:  S1/S2, RRR, No murmure, No Rubs or Gallops  pulmonary: Clear to auscultation bilaterally, respirations unlabored, negative wheezes /  crackles Abdomen: Soft, non-tender, non-distended, bowel sounds,no masses, no organomegaly Muscular skeletal: Limited exam - in bed, able to move all 4 extremities,   Neuro: CNII-XII intact. , normal motor and sensation, reflexes intact  Extremities: No pitting edema lower extremities, +2 pulses  Skin: Dry, warm to touch, negative for any Rashes, No open wounds Wounds: per nursing documentation   ------------------------------------------------------------------------------------------------------------------------------------------    LABs:     Latest Ref Rng & Units 07/19/2022    4:11 AM 07/18/2022    4:25 PM 05/27/2022    8:11 PM  CBC  WBC 4.0 - 10.5 K/uL 13.9  22.0  6.9   Hemoglobin 12.0 - 15.0 g/dL 12.1  14.6  12.2   Hematocrit 36.0 - 46.0 % 37.7  43.1  37.5   Platelets 150 - 400 K/uL 311  547  244       Latest Ref Rng & Units 07/19/2022    4:11 AM 07/18/2022    4:25 PM 05/27/2022    8:11 PM  CMP  Glucose 70 - 99 mg/dL 89  111  81   BUN 8 - 23 mg/dL $Remove'5  5  16   'SJoUFNs$ Creatinine 0.44 - 1.00 mg/dL 0.50  0.44  0.82   Sodium 135 - 145 mmol/L 131  131  137   Potassium 3.5 - 5.1 mmol/L 3.6  2.8  3.6   Chloride 98 - 111 mmol/L 99  94  104   CO2 22 - 32 mmol/L $RemoveB'25  27  27   'OSVKZcXT$ Calcium 8.9 - 10.3 mg/dL 9.0  9.6  9.7   Total Protein 6.5 - 8.1 g/dL 6.1  7.0  6.3   Total Bilirubin 0.3 - 1.2 mg/dL 0.5  0.7  0.4   Alkaline Phos 38 - 126 U/L 167  210  187   AST 15 - 41 U/L 15  13  72   ALT 0 - 44 U/L 17  22  83        Micro Results Recent Results (from the past 240 hour(s))  Culture, blood (routine x 2)     Status: None (Preliminary result)   Collection Time: 07/18/22  8:24 PM   Specimen: Right Antecubital; Blood  Result Value Ref Range Status   Specimen Description RIGHT ANTECUBITAL  Final   Special Requests BOTTLES DRAWN AEROBIC AND ANAEROBIC BCAV  Final   Culture   Final    NO GROWTH < 12 HOURS Performed at [facility], [address] [place], [place] [address]    Report Status  PENDING  Incomplete  Culture, blood (Routine X 2) w Reflex to ID Panel     Status: None (Preliminary result)   Collection Time: 07/18/22  9:32 PM  Specimen: Blood  Result Value Ref Range Status   Specimen Description BLOOD BLOOD LEFT HAND  Final   Special Requests   Final    BOTTLES DRAWN AEROBIC AND ANAEROBIC Blood Culture adequate volume   Culture   Final    NO GROWTH < 12 HOURS Performed at [facility], [address], [place], [place] [address]    Report Status PENDING  Incomplete    Radiology Reports MR BRAIN WO CONTRAST  Result Date: 07/19/2022 CLINICAL DATA:  66 year old female with altered mental status. Seizure. EXAM: MRI HEAD WITHOUT CONTRAST TECHNIQUE: Multiplanar, multiecho pulse sequences of the brain and surrounding structures were obtained without intravenous contrast. COMPARISON:  Head CT yesterday, and earlier. FINDINGS: Study is intermittently degraded by motion artifact despite repeated imaging attempts. Brain: No restricted diffusion to suggest acute infarction. No midline shift, mass effect, evidence of mass lesion, ventriculomegaly, extra-axial collection or acute intracranial hemorrhage. Cervicomedullary junction and pituitary are within normal limits. Scattered Patchy and confluent bilateral cerebral white matter T2 and FLAIR hyperintensity is in a nonspecific configuration, moderately advanced for age. No superimposed cortical encephalomalacia or chronic cerebral blood products identified. Deep gray matter nuclei, brainstem, and cerebellum appear spared. On routine coronal T2 imaging the hippocampal formations (series 14, image 15) at other mesial temporal lobe structures appear within normal limits. Vascular: Major intracranial vascular flow voids are grossly preserved. Skull and upper cervical spine: Advanced cervical spine degeneration visible at C3-C4. At least mild spinal stenosis there. Visualized bone marrow signal is within normal limits. Sinuses/Orbits: Negative.  Other: Mild bilateral mastoid air cell effusions appear to be chronic and not significantly changed from last year. Negative visible scalp and face. IMPRESSION: 1. No acute intracranial abnormality on mildly motion degraded exam. 2. Moderately advanced for age cerebral white matter signal changes are nonspecific, but most commonly due to chronic small vessel disease. 3. 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 Electronically Signed   By: Genevie Ann M.D.   On: 07/19/2022 10:17   CT Head Wo Contrast  Result Date: 07/18/2022 CLINICAL DATA:  Worsening headache altered with seizure EXAM: CT HEAD WITHOUT CONTRAST TECHNIQUE: Contiguous axial images were obtained from the base of the skull through the vertex without intravenous contrast. RADIATION DOSE REDUCTION: This exam was performed according to the departmental dose-optimization program which includes automated exposure control, adjustment of the mA and/or kV according to patient size and/or use of iterative reconstruction technique. COMPARISON:  CT brain 05/27/2022 FINDINGS: Brain: No acute territorial infarction, hemorrhage or intracranial mass. Motion degradation. Atrophy and chronic small vessel ischemic changes of the white matter. Stable ventricle size Vascular: No hyperdense vessels.  No unexpected calcification Skull: Normal. Negative for fracture or focal lesion. Sinuses/Orbits: No acute finding. Other: None IMPRESSION: 1. No CT evidence for acute intracranial abnormality. 2. Atrophy and chronic small vessel ischemic changes of the white matter Electronically Signed   By: Donavan Foil M.D.   On: 07/18/2022 18:43   DG Chest Port 1 View  Result Date: 07/18/2022 CLINICAL DATA:  Syncope EXAM: PORTABLE CHEST 1 VIEW COMPARISON:  05/13/2022 FINDINGS: Cardiac size is within normal limits. There are no signs of pulmonary edema or focal consolidation. Small linear densities in the lower lung fields have not changed,  possibly suggesting scarring. There is no pleural effusion or pneumothorax. IMPRESSION: No active disease. Electronically Signed   By: Elmer Picker M.D.   On: 07/18/2022 16:16    SIGNED: Deatra James, MD, FHM. [facility],  Pager (please use [email or web address] to page/text) Please use Epic Secure Chat for non-urgent communication (7AM-7PM)  If 7PM-7AM, please contact night-coverage [email or web address], 07/19/2022, 10:47 AM

## 2022-07-19 NOTE — ED Notes (Signed)
Pt given water per RN approval. 

## 2022-07-19 NOTE — Procedures (Signed)
Patient Name: TASHIKA GOODIN  MRN: [id number]  Epilepsy Attending: Charlsie Quest  Referring Physician/Provider: Shon Hale, MD  Date: 07/19/2022 Duration: 23.26 mins  Patient history: 66yo F with recurrent seizure like activity. EEG to evaluate for seizure.  Level of alertness: Awake  AEDs during EEG study: Ativan  Technical aspects: This EEG study was done with scalp electrodes positioned according to the 10-20 International system of electrode placement. Electrical activity was acquired at a sampling rate of 500Hz  and reviewed with a high frequency filter of 70Hz  and a low frequency filter of 1Hz . EEG data were recorded continuously and digitally stored.   Description: No clear posterior dominant rhythm was seen. EEG showed continuous generalized polymorphic 3 to 6 Hz theta-delta slowing admixed with 15 to 18 Hz beta activity. Hyperventilation and photic stimulation were not performed.     ABNORMALITY - Continuous slow, generalized  IMPRESSION: This study is suggestive of moderate diffuse encephalopathy, nonspecific etiology. No seizures or epileptiform discharges were seen throughout the recording.  Shaheim Mahar 

## 2022-07-19 NOTE — Progress Notes (Signed)
EEG completed, results pending. 

## 2022-07-19 NOTE — ED Notes (Signed)
Transported to MRI

## 2022-07-19 NOTE — ED Notes (Signed)
EEG at bedside.

## 2022-07-19 NOTE — Progress Notes (Signed)
Patient arrived via EMS at approximately 20:30. Pt not in restraints upon arrival. Per report, restraints removed earlier prior to transport. Notified [facility] and neurology of patient's arrival.

## 2022-07-19 NOTE — ED Notes (Signed)
Pt reports headache 10/10 pain. Offered tylenol but pt refused d/t using fiorcet, stated "it upsets my stomach" Provider contacted for approval to give other PRN pain med- BP concerns

## 2022-07-19 NOTE — ED Notes (Signed)
ED TO INPATIENT HANDOFF REPORT  ED Nurse Name and Phone #:   S Name/Age/Gender Jamie [name] 66 y.o. female Room/Bed: APA14/APA14  Code Status   Code Status: Full Code  Home/SNF/Other Home Patient oriented to: self, place, time, and situation Is this baseline? Yes   Triage Complete: Triage complete  Chief Complaint Seizure [facility]) [R56.9]  Triage Note Per patient's daughter patient has been refusing to take her antibiotics for her UTI because it makes her sick to her stomach. Patient states she is nauseous and has a migraine.    Allergies Allergies  Allergen Reactions   Amoxicillin-Pot Clavulanate Nausea And Vomiting    Can tolerate plain Amoxicillin   Azithromycin Nausea And Vomiting    Other reaction(s): Other (See Comments) unknown   Bupropion Nausea And Vomiting   Nsaids Other (See Comments)    Stomach problems   Prednisone Diarrhea   Vilazodone Diarrhea    Level of Care/Admitting Diagnosis ED Disposition     ED Disposition  Admit   Condition  --   Comment  Hospital Area: [facility] [[id number]]  Level of Care: Progressive [102]  Admit to Progressive based on following criteria: NEUROLOGICAL AND NEUROSURGICAL complex patients with significant risk of instability, who do not meet ICU criteria, yet require close observation or frequent assessment (< / = every 2 - 4 hours) with medical / nursing intervention.  May admit patient to [facility] or [facility] if equivalent level of care is available:: No  Covid Evaluation: Asymptomatic - no recent exposure (last 10 days) testing not required  Diagnosis: Seizure [facility]) [[id number]]  Admitting Physician: Marylyn Ishihara  Attending Physician: Marylyn Ishihara  Certification:: I certify this patient will need inpatient services for at least 2 midnights  Estimated Length of Stay: 3          B Medical/Surgery History Past Medical History:  Diagnosis Date   Anxiety    Arthritis     Chronic pain    Gastroenteritis    Headache    Hypotension    Insomnia    Suicidal ideation    Past Surgical History:  Procedure Laterality Date   BACK SURGERY     CARPAL TUNNEL RELEASE     LAPAROSCOPIC OOPHERECTOMY     MOUTH SURGERY       A IV Location/Drains/Wounds Patient Lines/Drains/Airways Status     Active Line/Drains/Airways     Name Placement date Placement time Site Days   Peripheral IV 07/18/22 22 G Left;Posterior Hand 07/18/22  1941  Hand  1   Pressure Injury 05/19/21 Sacrum Stage 1 -  Intact skin with non-blanchable redness of a localized area usually over a bony prominence. blanchable redness 05/19/21  1400  -- 426            Intake/Output Last 24 hours  Intake/Output Summary (Last 24 hours) at 07/19/2022 1623 Last data filed at 07/19/2022 1001 Gross per 24 hour  Intake 280.14 ml  Output --  Net 280.14 ml    Labs/Imaging Results for orders placed or performed during the hospital encounter of 07/18/22 (from the past 48 hour(s))  Comprehensive metabolic panel     Status: Abnormal   Collection Time: 07/18/22  4:25 PM  Result Value Ref Range   Sodium 131 (L) 135 - 145 mmol/L   Potassium 2.8 (L) 3.5 - 5.1 mmol/L   Chloride 94 (L) 98 - 111 mmol/L   CO2 27 22 - 32 mmol/L   Glucose, Bld 111 (  H) 70 - 99 mg/dL    Comment: Glucose reference range applies only to samples taken after fasting for at least 8 hours.   BUN 5 (L) 8 - 23 mg/dL   Creatinine, Ser 0.44 0.44 - 1.00 mg/dL   Calcium 9.6 8.9 - 10.3 mg/dL   Total Protein 7.0 6.5 - 8.1 g/dL   Albumin 3.5 3.5 - 5.0 g/dL   AST 13 (L) 15 - 41 U/L   ALT 22 0 - 44 U/L   Alkaline Phosphatase 210 (H) 38 - 126 U/L   Total Bilirubin 0.7 0.3 - 1.2 mg/dL   GFR, Estimated >60 >60 mL/min    Comment: (NOTE) Calculated using the CKD-EPI Creatinine Equation (2021)    Anion gap 10 5 - 15    Comment: Performed at [facility], [address] [place], [place] [address]  CBC with Differential     Status: Abnormal    Collection Time: 07/18/22  4:25 PM  Result Value Ref Range   WBC 22.0 (H) 4.0 - 10.5 K/uL   RBC 4.93 3.87 - 5.11 MIL/uL   Hemoglobin 14.6 12.0 - 15.0 g/dL   HCT 43.1 36.0 - 46.0 %   MCV 87.4 80.0 - 100.0 fL   MCH 29.6 26.0 - 34.0 pg   MCHC 33.9 30.0 - 36.0 g/dL   RDW 13.2 11.5 - 15.5 %   Platelets 547 (H) 150 - 400 K/uL   nRBC 0.0 0.0 - 0.2 %   Neutrophils Relative % 86 %   Neutro Abs 19.0 (H) 1.7 - 7.7 K/uL   Lymphocytes Relative 7 %   Lymphs Abs 1.5 0.7 - 4.0 K/uL   Monocytes Relative 6 %   Monocytes Absolute 1.3 (H) 0.1 - 1.0 K/uL   Eosinophils Relative 0 %   Eosinophils Absolute 0.1 0.0 - 0.5 K/uL   Basophils Relative 0 %   Basophils Absolute 0.1 0.0 - 0.1 K/uL   Immature Granulocytes 1 %   Abs Immature Granulocytes 0.16 (H) 0.00 - 0.07 K/uL    Comment: Performed at [facility], [address], [place], [place] [address]  Ethanol     Status: None   Collection Time: 07/18/22  4:27 PM  Result Value Ref Range   Alcohol, Ethyl (B) <10 <10 mg/dL    Comment: (NOTE) Lowest detectable limit for serum alcohol is 10 mg/dL.  For medical purposes only. Performed at [facility], [address] [place], [place] [address]   Urinalysis, Routine w reflex microscopic Urine, In & Out Cath     Status: Abnormal   Collection Time: 07/18/22  7:05 PM  Result Value Ref Range   Color, Urine YELLOW YELLOW   APPearance CLOUDY (A) CLEAR   Specific Gravity, Urine 1.017 1.005 - 1.030   pH 7.0 5.0 - 8.0   Glucose, UA NEGATIVE NEGATIVE mg/dL   Hgb urine dipstick MODERATE (A) NEGATIVE   Bilirubin Urine NEGATIVE NEGATIVE   Ketones, ur 20 (A) NEGATIVE mg/dL   Protein, ur 100 (A) NEGATIVE mg/dL   Nitrite NEGATIVE NEGATIVE   Leukocytes,Ua TRACE (A) NEGATIVE   RBC / HPF 6-10 0 - 5 RBC/hpf   WBC, UA >50 (H) 0 - 5 WBC/hpf   Bacteria, UA FEW (A) NONE SEEN   Squamous Epithelial / LPF 0-5 0 - 5   WBC Clumps PRESENT    Amorphous Crystal PRESENT     Comment: Performed at [facility], [address] [place],  [address]  Urine rapid drug screen (hosp  performed)     Status: Abnormal   Collection Time: 07/18/22  7:05 PM  Result Value Ref Range   Opiates NONE DETECTED NONE DETECTED   Cocaine NONE DETECTED NONE DETECTED   Benzodiazepines POSITIVE (A) NONE DETECTED   Amphetamines NONE DETECTED NONE DETECTED   Tetrahydrocannabinol NONE DETECTED NONE DETECTED   Barbiturates POSITIVE (A) NONE DETECTED    Comment: (NOTE) DRUG SCREEN FOR MEDICAL PURPOSES ONLY.  IF CONFIRMATION IS NEEDED FOR ANY PURPOSE, NOTIFY LAB WITHIN 5 DAYS.  LOWEST DETECTABLE LIMITS FOR URINE DRUG SCREEN Drug Class                     Cutoff (ng/mL) Amphetamine and metabolites    1000 Barbiturate and metabolites    200 Benzodiazepine                 [id number] Tricyclics and metabolites     300 Opiates and metabolites        300 Cocaine and metabolites        300 THC                            50 Performed at [facility], [address], [place], [place] [address]   Lactic acid, plasma     Status: Abnormal   Collection Time: 07/18/22  8:24 PM  Result Value Ref Range   Lactic Acid, Venous 2.3 (HH) 0.5 - 1.9 mmol/L    Comment: CRITICAL RESULT CALLED TO, READ BACK BY AND VERIFIED WITH: NICHOLS,K ON 07/18/22 AT 2150 BY LOY,C Performed at [facility], [address], [place], [place] [address]   Culture, blood (routine x 2)     Status: None (Preliminary result)   Collection Time: 07/18/22  8:24 PM   Specimen: Right Antecubital; Blood  Result Value Ref Range   Specimen Description RIGHT ANTECUBITAL    Special Requests BOTTLES DRAWN AEROBIC AND ANAEROBIC BCAV    Culture      NO GROWTH < 12 HOURS Performed at [facility], [address], [place], [place] [address]    Report Status PENDING   HIV Antibody (routine testing w rflx)     Status: None   Collection Time: 07/18/22  9:13 PM  Result Value Ref Range   HIV Screen 4th Generation wRfx Non Reactive Non Reactive    Comment: Performed at [place], [place] [address], [place], [place] [address]  CK     Status: Abnormal   Collection Time: 07/18/22  9:13 PM  Result Value Ref Range   Total CK 29 (L) 38 - 234 U/L    Comment: Performed at [facility], [address], [place], [place] [address]  Magnesium     Status: Abnormal   Collection Time: 07/18/22  9:13 PM  Result Value Ref Range   Magnesium 1.6 (L) 1.7 - 2.4 mg/dL    Comment: Performed at [facility], [address] [place], [place] [address]  Lactic acid, plasma     Status: None   Collection Time: 07/18/22  9:32 PM  Result Value Ref Range   Lactic Acid, Venous 1.7 0.5 - 1.9 mmol/L    Comment: Performed at [facility], [address] [place],  [address]  Culture, blood (Routine X 2) w Reflex to ID Panel     Status: None (Preliminary result)   Collection Time: 07/18/22  9:32 PM   Specimen: Blood  Result Value Ref Range  Specimen Description BLOOD BLOOD LEFT HAND    Special Requests      BOTTLES DRAWN AEROBIC AND ANAEROBIC Blood Culture adequate volume   Culture      NO GROWTH < 12 HOURS Performed at [facility], [address], [place], [place] [address]    Report Status PENDING   Comprehensive metabolic panel     Status: Abnormal   Collection Time: 07/19/22  4:11 AM  Result Value Ref Range   Sodium 131 (L) 135 - 145 mmol/L   Potassium 3.6 3.5 - 5.1 mmol/L    Comment: DELTA CHECK NOTED   Chloride 99 98 - 111 mmol/L   CO2 25 22 - 32 mmol/L   Glucose, Bld 89 70 - 99 mg/dL    Comment: Glucose reference range applies only to samples taken after fasting for at least 8 hours.   BUN 5 (L) 8 - 23 mg/dL   Creatinine, Ser 0.50 0.44 - 1.00 mg/dL   Calcium 9.0 8.9 - 10.3 mg/dL   Total Protein 6.1 (L) 6.5 - 8.1 g/dL   Albumin 3.1 (L) 3.5 - 5.0 g/dL   AST 15 15 - 41 U/L   ALT 17 0 - 44 U/L   Alkaline Phosphatase 167 (H) 38 - 126 U/L   Total Bilirubin 0.5 0.3 - 1.2 mg/dL   GFR, Estimated >60 >60 mL/min    Comment: (NOTE) Calculated using the CKD-EPI Creatinine Equation  (2021)    Anion gap 7 5 - 15    Comment: Performed at [facility], [address] [place], [place] [address]  CBC     Status: Abnormal   Collection Time: 07/19/22  4:11 AM  Result Value Ref Range   WBC 13.9 (H) 4.0 - 10.5 K/uL   RBC 4.07 3.87 - 5.11 MIL/uL   Hemoglobin 12.1 12.0 - 15.0 g/dL   HCT 37.7 36.0 - 46.0 %   MCV 92.6 80.0 - 100.0 fL   MCH 29.7 26.0 - 34.0 pg   MCHC 32.1 30.0 - 36.0 g/dL   RDW 13.2 11.5 - 15.5 %   Platelets 311 150 - 400 K/uL   nRBC 0.0 0.0 - 0.2 %    Comment: Performed at [facility], [address], [place], [place] [address]  CBG monitoring, ED     Status: Abnormal   Collection Time: 07/19/22  7:10 AM  Result Value Ref Range   Glucose-Capillary 68 (L) 70 - 99 mg/dL    Comment: Glucose reference range applies only to samples taken after fasting for at least 8 hours.  CBG monitoring, ED     Status: Abnormal   Collection Time: 07/19/22  8:12 AM  Result Value Ref Range   Glucose-Capillary 113 (H) 70 - 99 mg/dL    Comment: Glucose reference range applies only to samples taken after fasting for at least 8 hours.   MR BRAIN WO CONTRAST  Result Date: 07/19/2022 CLINICAL DATA:  66 year old female with altered mental status. Seizure. EXAM: MRI HEAD WITHOUT CONTRAST TECHNIQUE: Multiplanar, multiecho pulse sequences of the brain and surrounding structures were obtained without intravenous contrast. COMPARISON:  Head CT yesterday, and earlier. FINDINGS: Study is intermittently degraded by motion artifact despite repeated imaging attempts. Brain: No restricted diffusion to suggest acute infarction. No midline shift, mass effect, evidence of mass lesion, ventriculomegaly, extra-axial collection or acute intracranial hemorrhage. Cervicomedullary junction and pituitary are within normal limits. Scattered Patchy and confluent bilateral cerebral white matter T2 and FLAIR hyperintensity is in a nonspecific configuration, moderately  advanced for age. No superimposed cortical  encephalomalacia or chronic cerebral blood products identified. Deep gray matter nuclei, brainstem, and cerebellum appear spared. On routine coronal T2 imaging the hippocampal formations (series 14, image 15) at other mesial temporal lobe structures appear within normal limits. Vascular: Major intracranial vascular flow voids are grossly preserved. Skull and upper cervical spine: Advanced cervical spine degeneration visible at C3-C4. At least mild spinal stenosis there. Visualized bone marrow signal is within normal limits. Sinuses/Orbits: Negative. Other: Mild bilateral mastoid air cell effusions appear to be chronic and not significantly changed from last year. Negative visible scalp and face. IMPRESSION: 1. No acute intracranial abnormality on mildly motion degraded exam. 2. Moderately advanced for age cerebral white matter signal changes are nonspecific, but most commonly due to chronic small vessel disease. 3. 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 Electronically Signed   By: Odessa Fleming M.D.   On: 07/19/2022 10:17   CT Head Wo Contrast  Result Date: 07/18/2022 CLINICAL DATA:  Worsening headache altered with seizure EXAM: CT HEAD WITHOUT CONTRAST TECHNIQUE: Contiguous axial images were obtained from the base of the skull through the vertex without intravenous contrast. RADIATION DOSE REDUCTION: This exam was performed according to the departmental dose-optimization program which includes automated exposure control, adjustment of the mA and/or kV according to patient size and/or use of iterative reconstruction technique. COMPARISON:  CT brain 05/27/2022 FINDINGS: Brain: No acute territorial infarction, hemorrhage or intracranial mass. Motion degradation. Atrophy and chronic small vessel ischemic changes of the white matter. Stable ventricle size Vascular: No hyperdense vessels.  No unexpected calcification Skull: Normal. Negative for fracture or focal lesion.  Sinuses/Orbits: No acute finding. Other: None IMPRESSION: 1. No CT evidence for acute intracranial abnormality. 2. Atrophy and chronic small vessel ischemic changes of the white matter Electronically Signed   By: Jasmine Pang M.D.   On: 07/18/2022 18:43   DG Chest Port 1 View  Result Date: 07/18/2022 CLINICAL DATA:  Syncope EXAM: PORTABLE CHEST 1 VIEW COMPARISON:  05/13/2022 FINDINGS: Cardiac size is within normal limits. There are no signs of pulmonary edema or focal consolidation. Small linear densities in the lower lung fields have not changed, possibly suggesting scarring. There is no pleural effusion or pneumothorax. IMPRESSION: No active disease. Electronically Signed   By: Ernie Avena M.D.   On: 07/18/2022 16:16    Pending Labs Unresulted Labs (From admission, onward)     Start     Ordered   07/20/22 0500  CBC  Daily at 5am,   R      07/19/22 0706   07/20/22 0500  Comprehensive metabolic panel  Daily at 5am,   R      07/19/22 0706   07/18/22 2042  Urine Culture  (Urine Culture)  Add-on,   AD       Question:  Indication  Answer:  Altered mental status (if no other cause identified)   07/18/22 2042            Vitals/Pain Today's Vitals   07/19/22 1430 07/19/22 1500 07/19/22 1530 07/19/22 1600  BP: 111/65 110/71 109/66 104/69  Pulse: 82 84 79 86  Resp: 14 15 15 18   Temp:      TempSrc:      SpO2: 97% 97% 98% 98%  Weight:      Height:      PainSc:        Isolation Precautions No active isolations  Medications Medications  ondansetron (ZOFRAN)  injection 4 mg (4 mg Intravenous Not Given 07/18/22 1801)  cefTRIAXone (ROCEPHIN) 2 g in sodium chloride 0.9 % 100 mL IVPB (0 g Intravenous Stopped 07/19/22 1001)  oxyCODONE (Oxy IR/ROXICODONE) immediate release tablet 10 mg (10 mg Oral Given 07/19/22 1144)  pantoprazole (PROTONIX) EC tablet 40 mg (40 mg Oral Given 07/19/22 0928)  sodium chloride flush (NS) 0.9 % injection 3 mL (3 mLs Intravenous Given 07/19/22 1016)  sodium  chloride flush (NS) 0.9 % injection 3 mL (3 mLs Intravenous Not Given 07/19/22 1016)  sodium chloride flush (NS) 0.9 % injection 3 mL (has no administration in time range)  0.9 %  sodium chloride infusion (has no administration in time range)  acetaminophen (TYLENOL) tablet 650 mg (650 mg Oral Given 07/19/22 0108)    Or  acetaminophen (TYLENOL) suppository 650 mg ( Rectal See Alternative 07/19/22 0108)  traZODone (DESYREL) tablet 50 mg (50 mg Oral Given 07/19/22 0107)  polyethylene glycol (MIRALAX / GLYCOLAX) packet 17 g (has no administration in time range)  bisacodyl (DULCOLAX) suppository 10 mg (has no administration in time range)  ondansetron (ZOFRAN) tablet 4 mg (has no administration in time range)    Or  ondansetron (ZOFRAN) injection 4 mg (has no administration in time range)  heparin injection 5,000 Units (5,000 Units Subcutaneous Given 07/19/22 1418)  dextrose 5 %-0.9 % sodium chloride infusion (75 mL/hr Intravenous New Bag/Given 07/19/22 1219)  potassium chloride SA (KLOR-CON M) CR tablet 40 mEq (40 mEq Oral Not Given 07/18/22 2126)  LORazepam (ATIVAN) injection 1 mg (1 mg Intravenous Given 07/19/22 0108)  fentaNYL (SUBLIMAZE) injection 25 mcg ( Intravenous Canceled Entry 07/18/22 2340)  levETIRAcetam (KEPPRA) IVPB 1000 mg/100 mL premix (0 mg Intravenous Stopped 07/18/22 1753)  LORazepam (ATIVAN) injection 1 mg (1 mg Intravenous Given 07/18/22 1712)  potassium chloride 10 mEq in 100 mL IVPB (0 mEq Intravenous Stopped 07/19/22 0348)  LORazepam (ATIVAN) injection 1 mg (1 mg Intravenous Given 07/18/22 1740)  acetaminophen (TYLENOL) suppository 650 mg (650 mg Rectal Given 07/18/22 2012)  cefTRIAXone (ROCEPHIN) 1 g in sodium chloride 0.9 % 100 mL IVPB (0 g Intravenous Stopped 07/18/22 2114)  potassium chloride 10 mEq in 100 mL IVPB (0 mEq Intravenous Stopped 07/19/22 0348)  LORazepam (ATIVAN) injection 2 mg ( Intravenous Canceled Entry 07/18/22 2340)  magnesium sulfate IVPB 2 g 50 mL (0 g Intravenous  Stopped 07/19/22 0442)    Mobility walks with device High fall risk   Focused Assessments    R Recommendations: See Admitting Provider Note  Report given to:   Additional Notes:

## 2022-07-19 NOTE — ED Notes (Signed)
MRI contacted- pt ready for transport for MRI

## 2022-07-19 NOTE — ED Notes (Signed)
Spoke by phone with daughter, Leeroy Bock, and updated status. Awaiting bed assignment at [facility]. Daughter requested to be called upon pt transfer if possible

## 2022-07-19 NOTE — Consult Note (Incomplete)
NEURO HOSPITALIST CONSULT NOTE   Requestig physician: Dr. Denton Brick  Reason for Consult:***   History obtained from:  Patient   Chart  Patient and Chart   ***  HPI:                                                                                                                                          Jamie [name] is an 66 y.o. female with a PMHx of chronic pain syndrome with prior back surgery, chronically on opiates, migraine headaches, hypotension and insomnia in addition to benzodiazepine and alcohol use who initially presented to the [facility] via EMS for seizure activity. Patient was reportedly being given a bath by her CNA when she became unresponsive and stiff. This lasted about 10 minutes. On arrival to the ED she was complaining of a migraine headache that had started over the weekend. She was also complaining of nausea and chronic joint pain. She stated that she had run out of her hydrocodone.   Per ED RN note from yesterday: "Daughter states pt abuses her pain medications and drinks alcohol and smokes weed. Lelan Pons states pt had a seizure and would not come out of it. Stiff and jerking, arms, legs and eyes. Lelan Pons states pt stayed stiff 5-10 minutes long."  While in the ED, she had another seizure: "Pt began to whole body stiffin up and eyes blink. Pt turned blue and staff begun to start CPR and ambu bag was applied. Pt pulse noted. EDP at bedside to assess pt. Pt post ictal at this time. Verbal order to d/c fioricet."  She was postictal after her seizure in the ED exhibiting combative behavior and attacking RN with biting and scratching. Her UDS was positive for benzos and barbiturates. Na was 131. EtOH level was < 10. She had a WBC of 22 and met sepsis criteria in the context of proteus UTI. She was started on IV Rocephin.   She was loaded with Keppra in the [place].   No prior history of seizure disorder. She has a history of medication noncompliance as well as recurrent  falls and FTT.   Past Medical History:  Diagnosis Date   Anxiety    Arthritis    Chronic pain    Gastroenteritis    Headache    Hypotension    Insomnia    Suicidal ideation     Past Surgical History:  Procedure Laterality Date   BACK SURGERY     CARPAL TUNNEL RELEASE     LAPAROSCOPIC OOPHERECTOMY     MOUTH SURGERY      Family History  Problem Relation Age of Onset   Colon cancer Neg Hx            Social History:  reports that she has  never smoked. She has never used smokeless tobacco. She reports that she does not currently use alcohol. She reports that she does not use drugs.  Allergies  Allergen Reactions   Amoxicillin-Pot Clavulanate Nausea And Vomiting    Can tolerate plain Amoxicillin   Azithromycin Nausea And Vomiting    Other reaction(s): Other (See Comments) unknown   Bupropion Nausea And Vomiting   Nsaids Other (See Comments)    Stomach problems   Prednisone Diarrhea   Vilazodone Diarrhea    MEDICATIONS:                                                                                                                     Prior to Admission:  Medications Prior to Admission  Medication Sig Dispense Refill Last Dose   ALPRAZolam (XANAX) 1 MG tablet Take 1 tablet (1 mg total) by mouth 3 (three) times daily as needed for anxiety or sleep. 30 tablet 0 07/18/2022   Aspirin-Caffeine 845-65 MG PACK Take by mouth.   Past Week   butalbital-acetaminophen-caffeine (FIORICET) 50-325-40 MG tablet Take 1-2 tablets by mouth 2 (two) times daily as needed for headache. 24 tablet 0 unknown   loperamide (IMODIUM) 2 MG capsule Take 2 mg by mouth every 6 (six) hours as needed.   unknown   OVER THE COUNTER MEDICATION calcium   unknown   oxyCODONE 10 MG TABS Take 1 tablet (10 mg total) by mouth every 6 (six) hours as needed for severe pain. 4 tablet 0 07/18/2022   pantoprazole (PROTONIX) 40 MG tablet Take 40 mg by mouth daily.   unknown   tiZANidine (ZANAFLEX) 4 MG tablet Take 4 mg  by mouth 3 (three) times daily.   07/18/2022   traZODone (DESYREL) 100 MG tablet Take 100 mg by mouth at bedtime.   07/17/2022   cephALEXin (KEFLEX) 500 MG capsule Take 500 mg by mouth 2 (two) times daily.      oxyCODONE (ROXICODONE) 5 MG immediate release tablet Take 1 tablet (5 mg total) by mouth every 4 (four) hours as needed for severe pain. (Patient not taking: Reported on 07/18/2022) 40 tablet 0 Not Taking   potassium chloride (KLOR-CON) 10 MEQ tablet Take 1 tablet (10 mEq total) by mouth 2 (two) times daily for 7 days. (Patient not taking: Reported on 04/06/2022) 14 tablet 0    Scheduled:  heparin  5,000 Units Subcutaneous Q8H   ondansetron (ZOFRAN) IV  4 mg Intravenous Once   pantoprazole  40 mg Oral Daily   potassium chloride  40 mEq Oral Once   sodium chloride flush  3 mL Intravenous Q12H   sodium chloride flush  3 mL Intravenous Q12H   Continuous:  sodium chloride     cefTRIAXone (ROCEPHIN)  IV Stopped (07/19/22 1001)   dextrose 5 % and 0.9% NaCl 75 mL/hr at 07/19/22 2040   //***  ROS:  History obtained from {source of history:310783}  General ROS: negative for - chills, fatigue, fever, night sweats, weight gain or weight loss Psychological ROS: negative for - behavioral disorder, hallucinations, memory difficulties, mood swings or suicidal ideation Ophthalmic ROS: negative for - blurry vision, double vision, eye pain or loss of vision ENT ROS: negative for - epistaxis, nasal discharge, oral lesions, sore throat, tinnitus or vertigo Allergy and Immunology ROS: negative for - hives or itchy/watery eyes Hematological and Lymphatic ROS: negative for - bleeding problems, bruising or swollen lymph nodes Endocrine ROS: negative for - galactorrhea, hair pattern changes, polydipsia/polyuria or temperature intolerance Respiratory ROS: negative for - cough,  hemoptysis, shortness of breath or wheezing Cardiovascular ROS: negative for - chest pain, dyspnea on exertion, edema or irregular heartbeat Gastrointestinal ROS: negative for - abdominal pain, diarrhea, hematemesis, nausea/vomiting or stool incontinence Genito-Urinary ROS: negative for - dysuria, hematuria, incontinence or urinary frequency/urgency Musculoskeletal ROS: negative for - joint swelling or muscular weakness Neurological ROS: as noted in HPI Dermatological ROS: negative for rash and skin lesion changes   Blood pressure 104/62, pulse 83, temperature 98.7 F (37.1 C), temperature source Oral, resp. rate 16, height $RemoveBe'5\' 5"'moElmzBoz$  (1.651 m), weight 51 kg, SpO2 96 %.   General Examination:                                                                                                       Physical Exam  HEENT-  Normocephalic, no lesions, without obvious abnormality.  Normal external eye and conjunctiva.   Cardiovascular- S1-S2 audible, pulses palpable throughout   Lungs-no rhonchi or wheezing noted, no excessive working breathing.  Saturations within normal limits Abdomen- All 4 quadrants palpated and nontender Extremities- Warm, dry and intact Musculoskeletal-no joint tenderness, deformity or swelling Skin-warm and dry, no hyperpigmentation, vitiligo, or suspicious lesions  Neurological Examination Mental Status: Alert, oriented, thought content appropriate.  Speech fluent without evidence of aphasia.  Able to follow 3 step commands without difficulty. Cranial Nerves: II: Discs flat bilaterally; Visual fields grossly normal,  III,IV, VI: ptosis not present, extra-ocular motions intact bilaterally pupils equal, round, reactive to light and accommodation V,VII: smile symmetric, facial light touch sensation normal bilaterally VIII: hearing normal bilaterally IX,X: uvula rises symmetrically XI: bilateral shoulder shrug XII: midline tongue extension Motor: Right : Upper extremity    5/5    Left:     Upper extremity   5/5  Lower extremity   5/5     Lower extremity   5/5 Tone and bulk:normal tone throughout; no atrophy noted Sensory: Pinprick and light touch intact throughout, bilaterally Deep Tendon Reflexes: 2+ and symmetric throughout Plantars: Right: downgoing   Left: downgoing Cerebellar: normal finger-to-nose, normal rapid alternating movements and normal heel-to-shin test Gait: normal gait and station   Lab Results: Basic Metabolic Panel: Recent Labs  Lab 07/18/22 1625 07/18/22 2113 07/19/22 0411  NA 131*  --  131*  K 2.8*  --  3.6  CL 94*  --  99  CO2 27  --  25  GLUCOSE 111*  --  89  BUN  5*  --  5*  CREATININE 0.44  --  0.50  CALCIUM 9.6  --  9.0  MG  --  1.6*  --     CBC: Recent Labs  Lab 07/18/22 1625 07/19/22 0411  WBC 22.0* 13.9*  NEUTROABS 19.0*  --   HGB 14.6 12.1  HCT 43.1 37.7  MCV 87.4 92.6  PLT 547* 311    Cardiac Enzymes: Recent Labs  Lab 07/18/22 2113  CKTOTAL 29*    Lipid Panel: No results for input(s): "CHOL", "TRIG", "HDL", "CHOLHDL", "VLDL", "LDLCALC" in the last 168 hours.  Imaging: EEG adult  Result Date: 07/19/2022 Lora Havens, MD     07/19/2022  4:58 PM Patient Name: JOWANA THUMMA MRN: [id number] Epilepsy Attending: Lora Havens Referring Physician/Provider: Roxan Hockey, MD Date: 07/19/2022 Duration: 23.26 mins Patient history: 66yo F with recurrent seizure like activity. EEG to evaluate for seizure. Level of alertness: Awake AEDs during EEG study: Ativan Technical aspects: This EEG study was done with scalp electrodes positioned according to the 10-20 International system of electrode placement. Electrical activity was acquired at a sampling rate of $Remov'500Hz'Fizpnx$  and reviewed with a high frequency filter of $RemoveB'70Hz'ZwlLEjFY$  and a low frequency filter of $RemoveB'1Hz'SAJomLWe$ . EEG data were recorded continuously and digitally stored. Description: No clear posterior dominant rhythm was seen. EEG showed continuous generalized polymorphic 3 to  6 Hz theta-delta slowing admixed with 15 to 18 Hz beta activity. Hyperventilation and photic stimulation were not performed.   ABNORMALITY - Continuous slow, generalized IMPRESSION: This study is suggestive of moderate diffuse encephalopathy, nonspecific etiology. No seizures or epileptiform discharges were seen throughout the recording. Lora Havens   MR BRAIN WO CONTRAST  Result Date: 07/19/2022 CLINICAL DATA:  66 year old female with altered mental status. Seizure. EXAM: MRI HEAD WITHOUT CONTRAST TECHNIQUE: Multiplanar, multiecho pulse sequences of the brain and surrounding structures were obtained without intravenous contrast. COMPARISON:  Head CT yesterday, and earlier. FINDINGS: Study is intermittently degraded by motion artifact despite repeated imaging attempts. Brain: No restricted diffusion to suggest acute infarction. No midline shift, mass effect, evidence of mass lesion, ventriculomegaly, extra-axial collection or acute intracranial hemorrhage. Cervicomedullary junction and pituitary are within normal limits. Scattered Patchy and confluent bilateral cerebral white matter T2 and FLAIR hyperintensity is in a nonspecific configuration, moderately advanced for age. No superimposed cortical encephalomalacia or chronic cerebral blood products identified. Deep gray matter nuclei, brainstem, and cerebellum appear spared. On routine coronal T2 imaging the hippocampal formations (series 14, image 15) at other mesial temporal lobe structures appear within normal limits. Vascular: Major intracranial vascular flow voids are grossly preserved. Skull and upper cervical spine: Advanced cervical spine degeneration visible at C3-C4. At least mild spinal stenosis there. Visualized bone marrow signal is within normal limits. Sinuses/Orbits: Negative. Other: Mild bilateral mastoid air cell effusions appear to be chronic and not significantly changed from last year. Negative visible scalp and face. IMPRESSION: 1. No  acute intracranial abnormality on mildly motion degraded exam. 2. Moderately advanced for age cerebral white matter signal changes are nonspecific, but most commonly due to chronic small vessel disease. 3. 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 Electronically Signed   By: Genevie Ann M.D.   On: 07/19/2022 10:17   CT Head Wo Contrast  Result Date: 07/18/2022 CLINICAL DATA:  Worsening headache altered with seizure EXAM: CT HEAD WITHOUT CONTRAST TECHNIQUE: Contiguous axial images were obtained from the base of the skull through the vertex without intravenous contrast.  RADIATION DOSE REDUCTION: This exam was performed according to the departmental dose-optimization program which includes automated exposure control, adjustment of the mA and/or kV according to patient size and/or use of iterative reconstruction technique. COMPARISON:  CT brain 05/27/2022 FINDINGS: Brain: No acute territorial infarction, hemorrhage or intracranial mass. Motion degradation. Atrophy and chronic small vessel ischemic changes of the white matter. Stable ventricle size Vascular: No hyperdense vessels.  No unexpected calcification Skull: Normal. Negative for fracture or focal lesion. Sinuses/Orbits: No acute finding. Other: None IMPRESSION: 1. No CT evidence for acute intracranial abnormality. 2. Atrophy and chronic small vessel ischemic changes of the white matter Electronically Signed   By: Donavan Foil M.D.   On: 07/18/2022 18:43   DG Chest Port 1 View  Result Date: 07/18/2022 CLINICAL DATA:  Syncope EXAM: PORTABLE CHEST 1 VIEW COMPARISON:  05/13/2022 FINDINGS: Cardiac size is within normal limits. There are no signs of pulmonary edema or focal consolidation. Small linear densities in the lower lung fields have not changed, possibly suggesting scarring. There is no pleural effusion or pneumothorax. IMPRESSION: No active disease. Electronically Signed   By: Elmer Picker M.D.   On:  07/18/2022 16:16     Assessment: - Exam reveals *** - EEG: Abnormality: Continuous slow, generalized. Impression: This study is suggestive of moderate diffuse encephalopathy, nonspecific etiology. No seizures or epileptiform discharges were seen throughout the recording.   Recommendations: - ***  07/19/2022, 8:46 PM

## 2022-07-19 NOTE — TOC Initial Note (Signed)
Transition of Care [facility]) - Initial/Assessment Note    Patient Details  Name: QUETZALY EBNER MRN: [id number] Date of Birth: 30-Jul-1956  Transition of Care [facility]) CM/SW Contact:    Annice Needy, LCSW Phone Number: 07/19/2022, 2:38 PM  Clinical Narrative:                 Patient from home alone. Has Aide on Monday and Friday. Was in rehab in May at [facility]. Uses walker and cane. Has bsc in home but has been too weak to use and has been using adult diapers. Has been experiencing falls recently, calls EMS to help her up but will not go to the hospital. APS previously involved; however, case is currently closed. Daughter feels patient needs long term care.    Expected Discharge Plan: Home w Home Health Services Barriers to Discharge: Continued Medical Work up   Patient Goals and CMS Choice        Expected Discharge Plan and Services Expected Discharge Plan: Home w Home Health Services       Living arrangements for the past 2 months: Single Family Home                                      Prior Living Arrangements/Services Living arrangements for the past 2 months: Single Family Home   Patient language and need for interpreter reviewed:: Yes Do you feel safe going back to the place where you live?: Yes      Need for Family Participation in Patient Care: Yes (Comment) Care giver support system in place?: Yes (comment) Current home services: Homehealth aide Criminal Activity/Legal Involvement Pertinent to Current Situation/Hospitalization: No - Comment as needed  Activities of Daily Living      Permission Sought/Granted Permission sought to share information with : Family Supports    Share Information with NAME: daughter, chelsea           Emotional Assessment     Affect (typically observed): Appropriate Orientation: : Oriented to Self, Oriented to Place, Oriented to  Time, Oriented to Situation Alcohol / Substance Use: Not Applicable    Admission  diagnosis:  Seizure [facility]) [R56.9] Patient Active Problem List   Diagnosis Date Noted   Seizure (HCC) 07/18/2022   Generalized weakness 05/04/2022   Hypoglycemia 05/04/2022   Hypokalemia 04/06/2022   Falls frequently 04/06/2022   Alcohol abuse 04/06/2022   Opiate dependence --Chronic pain 04/06/2022   Benzodiazepine dependence /Chronic anxiety 04/06/2022   Abdominal pain    Nausea vomiting and diarrhea 05/08/2016   Sepsis, unspecified organism (HCC) 05/08/2016   Migraine 05/08/2016   AKI (acute kidney injury) (HCC) 05/08/2016   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 [place] [address] Phone: [phone] Fax: [phone]     Social Determinants of Health (SDOH) Interventions    Readmission Risk Interventions     No data to display

## 2022-07-19 NOTE — Hospital Course (Addendum)
Jamie [name]  is a 66 y.o. female  with pmhx relevant for arthralgia, migraine headaches, chronic pain syndrome with prior back surgery, chronically on opiates, anxiety and insomnia chronically on benzodiazepine, alcohol abuse and history of noncompliance , as well as h/o Recurrent Falls/Generalized weakness, recent UTI dxed on 07/16/22 at [facility] who now presents with concerns about seizures.... her CNA/home health care giver at home reports possible seizure episode at home today and pt had another episode of unresponsiveness and code blue was called initially----suspect seizures....had prolonged post-ictal state.....subsequently had agitation and restlessness and combativeness  -Additional H/o obtained patient's daughter Ms. Jamie [name] by [phone] -UDS is positive for benzos and barbiturates ---CT head without acute findings -Chest x-ray without acute findings -UA suggestive of UTI -Blood alcohol level negative -WBC 22.0 hemoglobin 14.6 platelets 547 -Sodium is 131, potassium is 2.8, chloride is 94, creatinine 0.44 -Alk phos is 210 AST and ALT are not elevated,  T. bili is not elevated      Assessment & Plan:   Principal Problem:   Seizure (HCC) Active Problems:   Hypokalemia   Alcohol abuse   Opiate dependence --Chronic pain   Benzodiazepine dependence /Chronic anxiety   Assessment and Plan:   Possible Recurrent Seizures- -Episodes of seizures overnight, currently stable much more awake alert  -Recurrent seizures possibly related to opiate or benzo or alcohol withdrawal  -UDS without opiates, no patient is prescribed opiates-- -  Oxycodone does not show up on UDS - Blood alcohol level negative this time around - CT head without acute findings - EEG >>> May need continuous EEG--- to be discharged by neurology at [facility]  -Notified on-call neurologist Dr. Kerney Elbe at [facility]--- official neurology consult when patient arrives at [facility] -Brain MRI  requested -Patient was loaded with Keppra in the ED -Seizure precautions and lorazepam as needed --Defer to neurology service if LP needed (patient has UTI with fevers and leukocytosis)   Polysubstance abuse/anxiety disorder /alcohol abuse ---  --UDS positive for benzos and barbiturates -Patient had previously refused rehab   Sepsis secondary to Proteus UTI--POA- -Still mildly hypotensive, but much more awake BP (!) 97/57, P 87, temp. 97.8 F (36.6 C), RR 17, , SpO2 100 % on RA   -met sepsis criteria on admission -WBC 22.0 , query partly reactive due to seizures -Patient also has a UTI with fevers, tachycardia and tachypnea -IV Rocephin pending blood and repeat urine culture   Proteus mirabilis UTI -patient apparently was diagnosed with UTI at [facility] on 07/16/2022 -Care everywhere review shows Proteus mirabilis from urine culture dated 07/14/2022 -Patient was prescribed Keflex which she did not take at home apparently -will treat empirically with IV Rocephin -May be switched back to Keflex on discharge   Social/Ethics -plan of care and advanced directive discussed with patient's daughter Ms. Jamie [name] by [phone] -Patient is a full code  Chronic pain syndrome with prior back surgery, chronically on opiates--be judicious with opiates    Hypokalemia/Hyponatremia/Hypochloremia-- --Sodium is 131, potassium is 2.8, chloride is 94, creatinine 0.44 -No vomiting or diarrhea reported -Mgnesium    Disposition/Need for in-Hospital Stay-- Transfer to [facility] for brain MRI and neurology evaluation as well as EEG given concerns for recurrent seizures -Defer to neurology service if LP needed

## 2022-07-20 LAB — CBC
HCT: 32.4 % — ABNORMAL LOW (ref 36.0–46.0)
Hemoglobin: 11 g/dL — ABNORMAL LOW (ref 12.0–15.0)
MCH: 30.1 pg (ref 26.0–34.0)
MCHC: 34 g/dL (ref 30.0–36.0)
MCV: 88.5 fL (ref 80.0–100.0)
Platelets: 227 10*3/uL (ref 150–400)
RBC: 3.66 MIL/uL — ABNORMAL LOW (ref 3.87–5.11)
RDW: 13.4 % (ref 11.5–15.5)
WBC: 8 10*3/uL (ref 4.0–10.5)
nRBC: 0 % (ref 0.0–0.2)

## 2022-07-20 LAB — COMPREHENSIVE METABOLIC PANEL
ALT: 14 U/L (ref 0–44)
AST: 12 U/L — ABNORMAL LOW (ref 15–41)
Albumin: 2.4 g/dL — ABNORMAL LOW (ref 3.5–5.0)
Alkaline Phosphatase: 127 U/L — ABNORMAL HIGH (ref 38–126)
Anion gap: 5 (ref 5–15)
BUN: 5 mg/dL — ABNORMAL LOW (ref 8–23)
CO2: 27 mmol/L (ref 22–32)
Calcium: 8.5 mg/dL — ABNORMAL LOW (ref 8.9–10.3)
Chloride: 104 mmol/L (ref 98–111)
Creatinine, Ser: 0.63 mg/dL (ref 0.44–1.00)
GFR, Estimated: >60 mL/min (ref >60)
Glucose, Bld: 97 mg/dL (ref 70–99)
Potassium: 3.1 mmol/L — ABNORMAL LOW (ref 3.5–5.1)
Sodium: 136 mmol/L (ref 135–145)
Total Bilirubin: 0.1 mg/dL — ABNORMAL LOW (ref 0.3–1.2)
Total Protein: 5 g/dL — ABNORMAL LOW (ref 6.5–8.1)

## 2022-07-20 LAB — URINE CULTURE

## 2022-07-20 MED ORDER — BUTALBITAL-APAP-CAFFEINE 50-325-40 MG PO TABS
1.0000 | ORAL_TABLET | Freq: Two times a day (BID) | ORAL | Status: DC | PRN
  Administered 2022-07-20 – 2022-07-22 (×5): 1 via ORAL
  Filled 2022-07-20 (×5): qty 1

## 2022-07-20 MED ORDER — OXYCODONE HCL 5 MG PO TABS
10.0000 mg | ORAL_TABLET | Freq: Four times a day (QID) | ORAL | Status: DC | PRN
  Administered 2022-07-20 – 2022-07-22 (×8): 10 mg via ORAL
  Filled 2022-07-20 (×8): qty 2

## 2022-07-20 MED ORDER — POTASSIUM CHLORIDE CRYS ER 20 MEQ PO TBCR
40.0000 meq | EXTENDED_RELEASE_TABLET | Freq: Once | ORAL | Status: AC
  Administered 2022-07-20: 40 meq via ORAL
  Filled 2022-07-20: qty 2

## 2022-07-20 NOTE — Progress Notes (Signed)
PROGRESS NOTE  Jamie [name]  DOB: 1956/06/19  PCP: Barbie Banner, MD IFO:[id number]  DOA: 07/18/2022  LOS: 2 days  Hospital Day: 3  Brief narrative: DEIONA HYMON is a 66 y.o. female with PMH significant for arthralgia, migraine headaches, chronic pain syndrome with prior back surgery, chronically on opiates, anxiety and insomnia chronically on benzodiazepine, alcohol abuse and history of noncompliance as well as h/o generalized weakness, recurrent falls, recently diagnosed UTI at outside facility, but did not take antibiotics because it made her sick. Per Jamie [name]'s daughter, Jamie [name] abuses her pain medicines, drinks alcohol and smokes weed.  Jamie [name] lives at home and has a home health caregiver 5 days a week On 7/24, home health caregiver noted an episode in which Jamie [name] had a stiff and jerking of her arms, legs and eyes that lasted 10 minutes.  Jamie [name] was brought into the ED with concern of seizure.  In the ED, Jamie [name] was hemodynamically stable.  She was noted to have feces in her hands, thighs and legs.  Also noted to have red and open areas to her sacrum. Initial labs showed sodium low at 131, potassium low at 2.8, WBC count elevated to 22,000, blood alcohol level not elevated. Urinalysis showed cloudy yellow urine. UDS is positive for benzodiazepine and barbiturates.  After 2 hours in the ED, Jamie [name] had a generalized tonic-clonic seizure followed by prolonged postictal state and subsequent agitation, restlessness and combativeness. Admitted to hospitalist service. She was loaded with IV Keppra and started on IV Rocephin for possible UTI. Transfer to [facility] for neurological evaluation and monitoring. MRI brain did not show any acute intracranial abnormality.  Showed moderately advanced chronic small vessel disease. EEG showed moderate diffuse encephalopathy of nonspecific etiology.  No seizures or epileptiform discharges were seen.  Subjective: Jamie [name] was seen and examined  this morning..  Pleasant elderly female.  Propped up in bed.  Not in distress.  Denies abusing substances Chart reviewed Had a low blood sugar episode of 68 this morning Labs from this morning with sodium at 131, WBC count better at 13.9  Assessment and plan: Generalized tonic-clonic seizure  -Observed at home as well as in the ED. -UDS positive for benzodiazepine and barbiturates.  Blood alcohol level not elevated -Jamie [name] was loaded with Keppra in the ED. -MRI brain did not show acute findings.  EEG unremarkable -Neurology consulted.  Per neurology note from this morning, Jamie [name] seems to have a provoked seizure and hence there is no need to continue Keppra. -Continue to follow seizure precautions   Recent UTI -Recently diagnosed UTI at outside facility 07/16/22. -Results from care everywhere showed 10,000 -50,000 CFU per mL of Proteus mirabilis. -Endorses dysuria. -Because of leukocytosis of 22,000, Jamie [name] was started on IV Rocephin. -Repeat urine culture sent.  Hypoglycemia -Blood sugar level was low at 68 this morning.  No history of diabetes mellitus -Started on D5 drip.  Continue same for next 24 hours.  Encourage oral intake Recent Labs  Lab 07/19/22 0710 07/19/22 0812  GLUCAP 68* 113*   Hyponatremia -Improved this morning. Recent Labs  Lab 07/18/22 1625 07/19/22 0411 07/20/22 0326  NA 131* 131* 136   Hypokalemia/hypomagnesemia -Replacement given.  Potassium is still low this morning.  Oral replacement added. Recent Labs  Lab 07/18/22 1625 07/18/22 2113 07/19/22 0411 07/20/22 0326  K 2.8*  --  3.6 3.1*  MG  --  1.6*  --   --    Polysubstance abuse -UDS positive for benzos and barbiturates.  -  Per daughter, Jamie [name] will use of pain medicines, drinks alcohol and smokes weed.  Jamie [name] however denies doing any of these. -Jamie [name] had previously refused rehab  arthralgia, migraine headaches, chronic pain syndrome with prior back surgery -Imaging also showed  advanced cervical spine degeneration and mild spinal stenosis at C3-C4 -chronically on opiates  anxiety and insomnia  -chronically on benzodiazepine  Long-term disposition -Currently getting home health care.  Daughter believes she needs long-term care.  Jamie [name] has refused in the past  Goals of care   Code Status: Full Code    Mobility: PT eval ordered  Skin assessment:  Pressure Injury 05/19/21 Sacrum Stage 1 -  Intact skin with non-blanchable redness of a localized area usually over a bony prominence. blanchable redness (Active)  05/19/21 1400  Location: Sacrum  Location Orientation:   Staging: Stage 1 -  Intact skin with non-blanchable redness of a localized area usually over a bony prominence.  Wound Description (Comments): blanchable redness  Present on Admission: Yes    Nutritional status:  Body mass index is 18.7 kg/m.          Diet:  Diet Order             Diet Heart Room service appropriate? Yes; Fluid consistency: Thin  Diet effective now                   DVT prophylaxis:  heparin injection 5,000 Units Start: 07/18/22 2200 SCDs Start: 07/18/22 2113 Place TED hose Start: 07/18/22 2113   Antimicrobials: IV Rocephin Fluid: D5 NS at 75 mill per hour Consultants: Neurology Family Communication: None at bedside  Status is: Inpatient  Continue in-hospital care because: Continue to monitor for next 24 hours, pending urine culture report Level of care: Progressive   Dispo: The Jamie [name] is from: Home              Anticipated d/c is to: Pending clinical course              Jamie [name] currently is not medically stable to d/c.   Difficult to place Jamie [name] No     Infusions:   sodium chloride     cefTRIAXone (ROCEPHIN)  IV Stopped (07/19/22 1001)   dextrose 5 % and 0.9% NaCl 75 mL/hr at 07/20/22 0751    Scheduled Meds:  heparin  5,000 Units Subcutaneous Q8H   ondansetron (ZOFRAN) IV  4 mg Intravenous Once   pantoprazole  40 mg Oral Daily    potassium chloride  40 mEq Oral Once   sodium chloride flush  3 mL Intravenous Q12H    PRN meds: sodium chloride, acetaminophen **OR** acetaminophen, bisacodyl, butalbital-acetaminophen-caffeine, LORazepam, ondansetron **OR** ondansetron (ZOFRAN) IV, mouth rinse, oxyCODONE, polyethylene glycol, sodium chloride flush, traZODone   Antimicrobials: Anti-infectives (From admission, onward)    Start     Dose/Rate Route Frequency Ordered Stop   07/19/22 1000  cefTRIAXone (ROCEPHIN) 2 g in sodium chloride 0.9 % 100 mL IVPB        2 g 200 mL/hr over 30 Minutes Intravenous Every 24 hours 07/18/22 2032     07/18/22 2015  cefTRIAXone (ROCEPHIN) 1 g in sodium chloride 0.9 % 100 mL IVPB        1 g 200 mL/hr over 30 Minutes Intravenous  Once 07/18/22 2013 07/18/22 2114       Objective: Vitals:   07/20/22 0821 07/20/22 1145  BP: 119/66 123/74  Pulse: 80 81  Resp: 19 19  Temp: 98.5 F (36.9 C) 97.9 F (  36.6 C)  SpO2: 96% 97%    Intake/Output Summary (Last 24 hours) at 07/20/2022 1159 Last data filed at 07/20/2022 0751 Gross per 24 hour  Intake 2948.05 ml  Output 1500 ml  Net 1448.05 ml   Filed Weights   07/18/22 1406  Weight: 51 kg   Weight change:  Body mass index is 18.7 kg/m.   Physical Exam: General exam: Pleasant, elderly, not in pain Skin: No rashes, lesions or ulcers. HEENT: Atraumatic, normocephalic, no obvious bleeding Lungs: Clear to auscultation bilaterally CVS: Regular rate and rhythm, no murmur GI/Abd soft, nontender, nondistended, bowel sound present CNS: Alert, awake, oriented x3 Psychiatry: Mood appropriate Extremities: No pedal edema, no calf tenderness  Data Review: I have personally reviewed the laboratory data and studies available.  F/u labs ordered Unresulted Labs (From admission, onward)     Start     Ordered   07/20/22 0500  CBC  Daily at 5am,   R      07/19/22 0706   07/20/22 0500  Comprehensive metabolic panel  Daily at 5am,   R      07/19/22  [id number]            Signed, Lorin Glass, MD [facility] 07/20/2022

## 2022-07-20 NOTE — Progress Notes (Signed)
Occupational Therapy Evaluation Patient Details Name: Jamie [name] MRN: [id number] DOB: Aug 21, 1956 Today's Date: 07/20/2022   History of Present Illness 66 y/o female presented to [facility] ED on 07/18/22 for possible seizure activity. Seizure activity noted in ED. MRI negative. Transferred to [facility]. EEG negative for seizures but suggests moderate diffuse encephalopathy. Recent diagnosis of UTI with noncompliance to medication. Admitted for sepsis 2/2 UTI and seizure. PMH: arthralgia, headaches, chronic pain syndrome with prior back surgery, chronic opiate use, insomnia on bezodiazepine, alcohol abuse, hx of noncompliance.   Clinical Impression   PTA, pt lived alone and has a PCA on Mondays and Fridays for 3 hours who assists with bathing and dressing, as well as other home management tasks. Upon eval, pt performing functional mobility and LB ADL with min guard A. Pt presents with decreased balance, strength, and activity tolerance. Pt demonstrating awareness, STM, ability to follow commands, and basic problem solving during session. Recommend discharge home with no follow up OT. Will continue to follow acutely to optimize functional independence in ADL and IADL.      Recommendations for follow up therapy are one component of a multi-disciplinary discharge planning process, led by the attending physician.  Recommendations may be updated based on patient status, additional functional criteria and insurance authorization.   Follow Up Recommendations  No OT follow up    Assistance Recommended at Discharge Frequent or constant Supervision/Assistance  Patient can return home with the following A little help with walking and/or transfers;A little help with bathing/dressing/bathroom;Assistance with cooking/housework;Direct supervision/assist for medications management;Direct supervision/assist for financial management;Assist for transportation;Help with stairs or ramp for entrance    Functional Status  Assessment  Patient has had a recent decline in their functional status and demonstrates the ability to make significant improvements in function in a reasonable and predictable amount of time.  Equipment Recommendations  None recommended by OT    Recommendations for Other Services       Precautions / Restrictions Precautions Precautions: Fall Restrictions Weight Bearing Restrictions: No      Mobility Bed Mobility Overal bed mobility: Needs Assistance Bed Mobility: Supine to Sit     Supine to sit: Supervision     General bed mobility comments: supervision for safety    Transfers Overall transfer level: Needs assistance Equipment used: Rolling walker (2 wheels) Transfers: Sit to/from Stand Sit to Stand: Min guard           General transfer comment: Min guard for safety. requiring increaed time      Balance Overall balance assessment: Needs assistance, History of Falls Sitting-balance support: No upper extremity supported, Feet supported Sitting balance-Leahy Scale: Good Sitting balance - Comments: Donning socks with min guard A   Standing balance support: Bilateral upper extremity supported, Reliant on assistive device for balance Standing balance-Leahy Scale: Poor Standing balance comment: Reliant on RW                           ADL either performed or assessed with clinical judgement   ADL Overall ADL's : Needs assistance/impaired Eating/Feeding: Set up;Sitting   Grooming: Min guard;Standing   Upper Body Bathing: Set up;Sitting   Lower Body Bathing: Min guard;Sit to/from stand   Upper Body Dressing : Set up;Sitting   Lower Body Dressing: Min guard;Sit to/from stand Lower Body Dressing Details (indicate cue type and reason): Doffing and donning socks sitting EOB. Toilet Transfer: Min guard;Ambulation;Regular Toilet;Rolling walker (2 wheels)  Functional mobility during ADLs: Min guard;Rolling walker (2 wheels) General ADL  Comments: Based on pt report, feel she is near her baseline.     Vision Patient Visual Report: Other (comment);Blurring of vision (Occasional blurring) Vision Assessment?: No apparent visual deficits Additional Comments: Return demonstrating throughout session. No dysmetria noted with finger to nose testing. Occulomotor ROM apppears to be [facility].     Perception     Praxis      Pertinent Vitals/Pain Pain Assessment Pain Assessment: Faces Faces Pain Scale: Hurts a little bit Pain Location: sacral wound Pain Descriptors / Indicators: Discomfort, Sore Pain Intervention(s): Monitored during session, Repositioned     Hand Dominance Right   Extremity/Trunk Assessment Upper Extremity Assessment Upper Extremity Assessment: Generalized weakness;RUE deficits/detail (Grossly ~4/5) RUE Deficits / Details: Reporting recent wrist fracture ~2 months ago. Decreased grip strength. Reporting doctor has told her she can use as much as comfortable. ROM WFL   Lower Extremity Assessment Lower Extremity Assessment: Generalized weakness   Cervical / Trunk Assessment Cervical / Trunk Assessment: Kyphotic   Communication Communication Communication: No difficulties   Cognition Arousal/Alertness: Awake/alert Behavior During Therapy: WFL for tasks assessed/performed Overall Cognitive Status: No family/caregiver present to determine baseline cognitive functioning                                 General Comments: Pt with skill to perform 3 word delayed memory recall. Pt with insight into current deficits, and with anticipatory awareness of needs upon return home. Pt reports she feels a little off, but near baseline. Pt with good skill to multitask during sesssion (conversating while performing functional tasks). Continue to assess higher level cognition.     General Comments  VSS. Pt reports she feels near baseline    Exercises     Shoulder Instructions      Home Living  Family/patient expects to be discharged to:: Private residence Living Arrangements: Alone Available Help at Discharge: Personal care attendant;Family;Available PRN/intermittently (PCA comes monday and friday for three hours each visit) Type of Home: House Home Access: Stairs to enter [employer] of Steps: 3 Entrance Stairs-Rails: None (wall on one side and door on the other when opened) Home Layout: One level;Other (Comment) (Basement. Only enters to change filters)     Bathroom Shower/Tub: Chief Strategy Officer: Handicapped height Bathroom Accessibility: Yes How Accessible: Accessible via walker Home Equipment: Rolling Walker (2 wheels);Shower seat - built in;BSC/3in1;Cane - quad;Cane - single point   Additional Comments: Pt reports that she has not had PCA for long      Prior Functioning/Environment Prior Level of Function : Needs assist;Driving;History of Falls (last six months)             Mobility Comments: uses cane throughout the house due to RW being "too big" to fit in places around the house. Reports 3 falls in past 6 months but per chart review, number seems to be a little higher ADLs Comments: PCA assists with bathing and dressing but patient able to dress on days PCA is not there. Patient does her own grocery shopping and drives        OT Problem List: Decreased strength;Decreased activity tolerance;Impaired balance (sitting and/or standing);Impaired vision/perception;Decreased safety awareness;Pain      OT Treatment/Interventions:      OT Goals(Current goals can be found in the care plan section) Acute Rehab OT Goals Patient Stated Goal: Go home OT Goal Formulation:  With patient Time For Goal Achievement: 08/03/22 Potential to Achieve Goals: Good ADL Goals Pt Will Perform Grooming: with modified independence;standing Pt Will Perform Lower Body Dressing: with modified independence;sit to/from stand Pt Will Transfer to Toilet: with  modified independence;ambulating;regular height toilet Pt Will Perform Toileting - Clothing Manipulation and hygiene: with modified independence;sit to/from stand Pt Will Perform Tub/Shower Transfer: with modified independence;ambulating;shower seat;Tub transfer  OT Frequency:      Co-evaluation              AM-PAC OT "6 Clicks" Daily Activity     Outcome Measure Help from another person eating meals?: A Little Help from another person taking care of personal grooming?: A Little Help from another person toileting, which includes using toliet, bedpan, or urinal?: A Little Help from another person bathing (including washing, rinsing, drying)?: A Little Help from another person to put on and taking off regular upper body clothing?: A Little Help from another person to put on and taking off regular lower body clothing?: A Little 6 Click Score: 18   End of Session Equipment Utilized During Treatment: Gait belt;Rolling walker (2 wheels) Nurse Communication: Mobility status  Activity Tolerance: Patient tolerated treatment well Patient left: in bed;with call bell/phone within reach;with bed alarm set  OT Visit Diagnosis: Unsteadiness on feet (R26.81);Muscle weakness (generalized) (M62.81);Pain Pain - part of body:  (sacral wound)                Time: [id number] OT Time Calculation (min): 15 min Charges:  OT General Charges $OT Visit: 1 Visit OT Evaluation $OT Eval Low Complexity: 1 Low  [name], OTR/L [facility] Acute Rehabilitation Office: [phone]   Lula Olszewski 07/20/2022, 4:01 PM

## 2022-07-20 NOTE — Evaluation (Signed)
Physical Therapy Evaluation Patient Details Name: Jamie [name] MRN: [id number] DOB: 06-16-1956 Today's Date: 07/20/2022  History of Present Illness  66 y/o female presented to [facility] on 07/18/22 for possible seizure activity. Seizure activity noted in ED. MRI negative. Transferred to [facility]. EEG negative for seizures but suggests moderate diffuse encephalopathy. Recent diagnosis of UTI with noncompliance to medication. Admitted for sepsis 2/2 UTI and seizure. PMH: arthralgia, headaches, chronic pain syndrome with prior back surgery, chronic opiate use, insomnia on bezodiazepine, alcohol abuse, hx of noncompliance.  Clinical Impression  Patient admitted with the above. PTA, patient lives alone and has PCA M&F for 3 hours each day who assists with bathing, dressing, and "odd jobs". Patient was ambulating with quad cane in the home and community. Patient presents with weakness, impaired balance, and decreased activity tolerance. No family present to determine baseline cognition but seems [facility] with basic mobility tasks. Patient requiring minA for sit to stand and min guard for ambulation with RW as she reports unsteadiness and lightheadedness during short mobility. Encouraged use of RW at home for safety and stability. Patient will benefit from skilled PT services during acute stay to address listed deficits. Recommend HHPT at discharge to maximize functional mobility and safety.        Recommendations for follow up therapy are one component of a multi-disciplinary discharge planning process, led by the attending physician.  Recommendations may be updated based on patient status, additional functional criteria and insurance authorization.  Follow Up Recommendations Home health PT      Assistance Recommended at Discharge Frequent or constant Supervision/Assistance  Patient can return home with the following  A little help with walking and/or transfers;A little help with bathing/dressing/bathroom;Assistance  with cooking/housework;Direct supervision/assist for medications management;Direct supervision/assist for financial management;Assist for transportation;Help with stairs or ramp for entrance    Equipment Recommendations Rolling Wadie Liew (2 wheels)  Recommendations for Other Services  OT consult    Functional Status Assessment Patient has had a recent decline in their functional status and demonstrates the ability to make significant improvements in function in a reasonable and predictable amount of time.     Precautions / Restrictions Precautions Precautions: Fall Restrictions Weight Bearing Restrictions: No      Mobility  Bed Mobility Overal bed mobility: Needs Assistance Bed Mobility: Supine to Sit     Supine to sit: Supervision     General bed mobility comments: supervision for safety    Transfers Overall transfer level: Needs assistance Equipment used: Straight cane, Rolling Esau Fridman (2 wheels) Transfers: Sit to/from Stand Sit to Stand: Min assist, Min guard           General transfer comment: initially standing from EOB with SPC but required minA to boost up into standing and steady. Patient requesting to utilize RW for stability. Able to stand with RW with min guard    Ambulation/Gait Ambulation/Gait assistance: Min guard Gait Distance (Feet): 15 Feet Assistive device: Rolling Dashawn Golda (2 wheels) Gait Pattern/deviations: Step-through pattern, Decreased stride length, Trunk flexed Gait velocity: decreased     General Gait Details: min guard for safety. Patient unsteady with short ambulation distance. Complaining of lightheadedness with mobility.  Stairs            Wheelchair Mobility    Modified Rankin (Stroke Patients Only)       Balance Overall balance assessment: Needs assistance, History of Falls Sitting-balance support: No upper extremity supported, Feet supported Sitting balance-Leahy Scale: Good     Standing balance support: Bilateral  upper extremity supported, Reliant on assistive device for balance Standing balance-Leahy Scale: Poor                               Pertinent Vitals/Pain Pain Assessment Pain Assessment: Faces Faces Pain Scale: Hurts even more Pain Location: chest from chest compressions Pain Descriptors / Indicators: Discomfort, Grimacing, Sore Pain Intervention(s): Monitored during session, Repositioned, Limited activity within patient's tolerance    Home Living Family/patient expects to be discharged to:: Private residence Living Arrangements: Alone Available Help at Discharge: Personal care attendant;Family;Available PRN/intermittently (PCA comes M & F for 3 hours each day) Type of Home: House Home Access: Stairs to enter Entrance Stairs-Rails: None (wall on one side and door on other when opened) Entrance Stairs-Number of Steps: 3   Home Layout: One level;Other (Comment) (has basement that she only goes down there to change filters) Home Equipment: Rolling Jazz Rogala (2 wheels);Shower seat - built in;BSC/3in1;Cane - quad;Cane - single point      Prior Function Prior Level of Function : Needs assist;Driving;History of Falls (last six months)             Mobility Comments: uses cane throughout the house due to RW being "too big" to fit in places around the house. Reports 3 falls in past 6 months but per chart review, number seems to be a little higher ADLs Comments: PCA assists with bathing and dressing but patient able to dress on days PCA is not there. Patient does her own grocery shopping and drives     Hand Dominance   Dominant Hand: Right    Extremity/Trunk Assessment   Upper Extremity Assessment Upper Extremity Assessment: Generalized weakness    Lower Extremity Assessment Lower Extremity Assessment: Generalized weakness    Cervical / Trunk Assessment Cervical / Trunk Assessment: Kyphotic  Communication   Communication: No difficulties  Cognition  Arousal/Alertness: Awake/alert Behavior During Therapy: WFL for tasks assessed/performed Overall Cognitive Status: No family/caregiver present to determine baseline cognitive functioning                                 General Comments: WFL for mobility tasks        General Comments      Exercises     Assessment/Plan    PT Assessment Patient needs continued PT services  PT Problem List Decreased strength;Decreased activity tolerance;Decreased balance;Decreased mobility       PT Treatment Interventions DME instruction;Therapeutic exercise;Balance training;Therapeutic activities;Gait training;Stair training;Functional mobility training;Patient/family education    PT Goals (Current goals can be found in the Care Plan section)  Acute Rehab PT Goals Patient Stated Goal: to go home tomorrow PT Goal Formulation: With patient Time For Goal Achievement: 08/03/22 Potential to Achieve Goals: Good    Frequency Min 3X/week     Co-evaluation               AM-PAC PT "6 Clicks" Mobility  Outcome Measure Help needed turning from your back to your side while in a flat bed without using bedrails?: A Little Help needed moving from lying on your back to sitting on the side of a flat bed without using bedrails?: A Little Help needed moving to and from a bed to a chair (including a wheelchair)?: A Little Help needed standing up from a chair using your arms (e.g., wheelchair or bedside chair)?: A Little Help needed to walk in  hospital room?: A Little Help needed climbing 3-5 steps with a railing? : A Little 6 Click Score: 18    End of Session Equipment Utilized During Treatment: Gait belt Activity Tolerance: Patient tolerated treatment well Patient left: in chair;with call bell/phone within reach;with chair alarm set Nurse Communication: Mobility status PT Visit Diagnosis: Unsteadiness on feet (R26.81);Muscle weakness (generalized) (M62.81);History of falling  (Z91.81);Other abnormalities of gait and mobility (R26.89)    Time: 8295-6213 PT Time Calculation (min) (ACUTE ONLY): 32 min   Charges:   PT Evaluation $PT Eval Moderate Complexity: 1 Mod PT Treatments $Therapeutic Activity: 8-22 mins        Zelig Gacek A. Dan Humphreys PT, DPT Acute Rehabilitation Services Office [phone]   Viviann Spare 07/20/2022, 2:44 PM

## 2022-07-21 LAB — COMPREHENSIVE METABOLIC PANEL
ALT: 111 U/L — ABNORMAL HIGH (ref 0–44)
AST: 206 U/L — ABNORMAL HIGH (ref 15–41)
Albumin: 2.3 g/dL — ABNORMAL LOW (ref 3.5–5.0)
Alkaline Phosphatase: 155 U/L — ABNORMAL HIGH (ref 38–126)
Anion gap: 3 — ABNORMAL LOW (ref 5–15)
BUN: 7 mg/dL — ABNORMAL LOW (ref 8–23)
CO2: 25 mmol/L (ref 22–32)
Calcium: 8.6 mg/dL — ABNORMAL LOW (ref 8.9–10.3)
Chloride: 107 mmol/L (ref 98–111)
Creatinine, Ser: 0.57 mg/dL (ref 0.44–1.00)
GFR, Estimated: >60 mL/min (ref >60)
Glucose, Bld: 98 mg/dL (ref 70–99)
Potassium: 3.6 mmol/L (ref 3.5–5.1)
Sodium: 135 mmol/L (ref 135–145)
Total Bilirubin: <0.1 mg/dL — ABNORMAL LOW (ref 0.3–1.2)
Total Protein: 4.8 g/dL — ABNORMAL LOW (ref 6.5–8.1)

## 2022-07-21 LAB — HEPATITIS PANEL, ACUTE
HCV Ab: NONREACTIVE
Hep A IgM: NONREACTIVE
Hep B C IgM: NONREACTIVE
Hepatitis B Surface Ag: NONREACTIVE

## 2022-07-21 LAB — CBC
HCT: 32.1 % — ABNORMAL LOW (ref 36.0–46.0)
Hemoglobin: 10.6 g/dL — ABNORMAL LOW (ref 12.0–15.0)
MCH: 29.8 pg (ref 26.0–34.0)
MCHC: 33 g/dL (ref 30.0–36.0)
MCV: 90.2 fL (ref 80.0–100.0)
Platelets: 230 10*3/uL (ref 150–400)
RBC: 3.56 MIL/uL — ABNORMAL LOW (ref 3.87–5.11)
RDW: 13.4 % (ref 11.5–15.5)
WBC: 6.7 10*3/uL (ref 4.0–10.5)
nRBC: 0 % (ref 0.0–0.2)

## 2022-07-21 NOTE — TOC Initial Note (Signed)
Transition of Care [facility]) - Initial/Assessment Note    Patient Details  Name: Jamie [name] MRN: [id number] Date of Birth: Apr 27, 1956  Transition of Care [facility]) CM/SW Contact:    Kermit Balo, RN Phone Number: 07/21/2022, 3:13 PM  Clinical Narrative:                 Pt is from home alone. She manages her own medications and states she still drives at times. She has an aide that come Monday and Friday to assist with house cleaning and errands. She states her daughter checks on her some.  Recommendations are for home health with supervision. With pt permission, CM spoke to pts daughter. Daughter states she can not provide recommended supervision and pt is already active with APS. Daughter will update her APS worker on this admission. Daughter doesn't want her returning home either but pt wont d/c to rehab.  CM spoke with the patient again and she is refusing SNF rehab. CM expressed how she will be unsafe at home and that she could use the rehab to be stronger when she returns home but she is not agreeable. She is agreeable to [facility] services which have been arranged through [facility]. They will be able to see the patient over the weekend for [facility]. Information on the AVS.  TOC following for further d/c needs.   Expected Discharge Plan: Home w Home Health Services Barriers to Discharge: Continued Medical Work up   Patient Goals and CMS Choice   CMS Medicare.gov Compare Post Acute Care list provided to:: Patient Choice offered to / list presented to : Patient  Expected Discharge Plan and Services Expected Discharge Plan: Home w Home Health Services   Discharge Planning Services: CM Consult Post Acute Care Choice: Home Health Living arrangements for the past 2 months: Single Family Home                           HH Arranged: PT, Social Work, Charity fundraiser HH Agency: [facility] Date HH Agency Contacted: 07/21/22   Representative spoke with at [facility] Agency: Clifton Custard  Prior Living  Arrangements/Services Living arrangements for the past 2 months: Single Family Home Lives with:: Self Patient language and need for interpreter reviewed:: Yes Do you feel safe going back to the place where you live?: Yes      Need for Family Participation in Patient Care: Yes (Comment) Care giver support system in place?: Yes (comment) Current home services: DME (walker/ cane/ built in shower seat/) Criminal Activity/Legal Involvement Pertinent to Current Situation/Hospitalization: No - Comment as needed  Activities of Daily Living Home Assistive Devices/Equipment: Cane (specify quad or straight) ADL Screening (condition at time of admission) Patient's cognitive ability adequate to safely complete daily activities?: Yes Is the patient deaf or have difficulty hearing?: No Does the patient have difficulty seeing, even when wearing glasses/contacts?: No Does the patient have difficulty concentrating, remembering, or making decisions?: No Patient able to express need for assistance with ADLs?: Yes Does the patient have difficulty dressing or bathing?: No Independently performs ADLs?: Yes (appropriate for developmental age) Does the patient have difficulty walking or climbing stairs?: No Weakness of Legs: None Weakness of Arms/Hands: None  Permission Sought/Granted Permission sought to share information with : Family Supports    Share Information with NAME: daughter, chelsea           Emotional Assessment     Affect (typically observed): Appropriate Orientation: : Oriented to Self,  Oriented to Place, Oriented to  Time, Oriented to Situation Alcohol / Substance Use: Not Applicable Psych Involvement: No (comment)  Admission diagnosis:  Hypomagnesemia [E83.42] Hyponatremia [E87.1] Seizure (HCC) [R56.9] Urinary tract infection with hematuria, site unspecified [N39.0, R31.9] Altered mental status, unspecified altered mental status type [R41.82] Patient Active Problem List    Diagnosis Date Noted   Seizure (HCC) 07/18/2022   Generalized weakness 05/04/2022   Hypoglycemia 05/04/2022   Hypokalemia 04/06/2022   Falls frequently 04/06/2022   Alcohol abuse 04/06/2022   Opiate dependence --Chronic pain 04/06/2022   Benzodiazepine dependence /Chronic anxiety 04/06/2022   Abdominal pain    Nausea vomiting and diarrhea 05/08/2016   Sepsis, unspecified organism (HCC) 05/08/2016   Migraine 05/08/2016   AKI (acute kidney injury) (HCC) 05/08/2016   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 [place] [address] Phone: [phone] Fax: [phone]     Social Determinants of Health (SDOH) Interventions    Readmission Risk Interventions     No data to display

## 2022-07-21 NOTE — Progress Notes (Signed)
Physical Therapy Treatment Patient Details Name: Jamie [name] MRN: [id number] DOB: 1956/04/02 Today's Date: 07/21/2022   History of Present Illness 66 y/o female presented to [facility] ED on 07/18/22 for possible seizure activity. Seizure activity noted in ED. MRI negative. Transferred to [facility] EEG negative for seizures but suggests moderate diffuse encephalopathy. Recent diagnosis of UTI with noncompliance to medication. Admitted for sepsis 2/2 UTI and seizure. PMH: arthralgia, headaches, chronic pain syndrome with prior back surgery, chronic opiate use, insomnia on bezodiazepine, alcohol abuse, hx of noncompliance.    PT Comments    Patient encouraged to attempt steps this session for LE strength and home entry, but declined once close to steps stating just did not feel up to it today.  Feel she was limited with reports of migraine exacerbation.  PT will follow up.  Continue to recommend HHPT.    Recommendations for follow up therapy are one component of a multi-disciplinary discharge planning process, led by the attending physician.  Recommendations may be updated based on patient status, additional functional criteria and insurance authorization.  Follow Up Recommendations  Home health PT     Assistance Recommended at Discharge Frequent or constant Supervision/Assistance  Patient can return home with the following A little help with walking and/or transfers;A little help with bathing/dressing/bathroom;Assistance with cooking/housework;Direct supervision/assist for medications management;Direct supervision/assist for financial management;Assist for transportation;Help with stairs or ramp for entrance   Equipment Recommendations  Rolling walker (2 wheels)    Recommendations for Other Services       Precautions / Restrictions Precautions Precautions: Fall     Mobility  Bed Mobility Overal bed mobility: Needs Assistance Bed Mobility: Supine to Sit, Sit to Supine     Supine to sit:  Supervision, HOB elevated Sit to supine: Supervision        Transfers Overall transfer level: Needs assistance Equipment used: Rolling walker (2 wheels) Transfers: Sit to/from Stand Sit to Stand: Min guard           General transfer comment: Min guard for safety. requiring increaed time    Ambulation/Gait Ambulation/Gait assistance: Min guard Gait Distance (Feet): 100 Feet Assistive device: Rolling walker (2 wheels) Gait Pattern/deviations: Step-through pattern, Decreased stride length, Shuffle, Trunk flexed       General Gait Details: not able to straighten up in standing, reports due to having a rod in her back   Stairs             Wheelchair Mobility    Modified Rankin (Stroke Patients Only)       Balance Overall balance assessment: Needs assistance, History of Falls Sitting-balance support: Feet supported Sitting balance-Leahy Scale: Good     Standing balance support: Bilateral upper extremity supported Standing balance-Leahy Scale: Poor Standing balance comment: Reliant on RW                            Cognition Arousal/Alertness: Awake/alert Behavior During Therapy: WFL for tasks assessed/performed Overall Cognitive Status: Within Functional Limits for tasks assessed                                          Exercises      General Comments General comments (skin integrity, edema, etc.): Visitors entered room end of session.      Pertinent Vitals/Pain Pain Assessment Faces Pain Scale: Hurts worst Pain Location: chronic migraine  Pain Descriptors / Indicators: Headache Pain Intervention(s): Monitored during session, Patient requesting pain meds-RN notified    Home Living                          Prior Function            PT Goals (current goals can now be found in the care plan section) Progress towards PT goals: Progressing toward goals    Frequency    Min 3X/week      PT Plan  Current plan remains appropriate    Co-evaluation              AM-PAC PT "6 Clicks" Mobility   Outcome Measure  Help needed turning from your back to your side while in a flat bed without using bedrails?: None Help needed moving from lying on your back to sitting on the side of a flat bed without using bedrails?: A Little Help needed moving to and from a bed to a chair (including a wheelchair)?: A Little Help needed standing up from a chair using your arms (e.g., wheelchair or bedside chair)?: A Little Help needed to walk in hospital room?: A Little Help needed climbing 3-5 steps with a railing? : Total 6 Click Score: 17    End of Session Equipment Utilized During Treatment: Gait belt Activity Tolerance: Patient tolerated treatment well Patient left: in bed;with call bell/phone within reach;with bed alarm set;with family/visitor present   PT Visit Diagnosis: Unsteadiness on feet (R26.81);Muscle weakness (generalized) (M62.81);History of falling (Z91.81);Other abnormalities of gait and mobility (R26.89)     Time: 8295-6213 PT Time Calculation (min) (ACUTE ONLY): 20 min  Charges:  $Gait Training: 8-22 mins                     Sheran Lawless, PT Acute Rehabilitation Services Office:[phone] 07/21/2022    Jamie [name] 07/21/2022, 4:09 PM

## 2022-07-21 NOTE — Progress Notes (Signed)
Occupational Therapy Evaluation Patient Details Name: Jamie [name] MRN: [id number] DOB: Mar 19, 1956 Today's Date: 07/21/2022   History of Present Illness 66 y/o female presented to [facility] on 07/18/22 for possible seizure activity. Seizure activity noted in ED. MRI negative. Transferred to [facility]. EEG negative for seizures but suggests moderate diffuse encephalopathy. Recent diagnosis of UTI with noncompliance to medication. Admitted for sepsis 2/2 UTI and seizure. PMH: arthralgia, headaches, chronic pain syndrome with prior back surgery, chronic opiate use, insomnia on bezodiazepine, alcohol abuse, hx of noncompliance.   Clinical Impression   Pt progressing towards OT goals. Assessing higher level cognition including organization, delayed memory recall, sequencing, and problem solving with short blessed test and pill box test this session. Pt scored a 0 on the short blessed test indicating near normal function for basic cognitive tasks. Pr required increased time to name the months of the year in the reverse order. Pt performing pill box test in 8 minutes and 30 seconds, indicating slower processing speed, however, no errors for performance. Pt also observed to request help opening 2 pill bottles during test. Pt with insight to identify may need to prepare pill box with assistance as she becomes stronger and processing improves. Pt continues to present with generalized weakness and slower processing speed. Recommend discharge home with intermittent supervision/assistance and no follow up OT. Will continue to follow acutely as admitted.      Recommendations for follow up therapy are one component of a multi-disciplinary discharge planning process, led by the attending physician.  Recommendations may be updated based on patient status, additional functional criteria and insurance authorization.   Follow Up Recommendations  No OT follow up    Assistance Recommended at Discharge Intermittent  Supervision/Assistance  Patient can return home with the following A little help with walking and/or transfers;A little help with bathing/dressing/bathroom;Assistance with cooking/housework;Direct supervision/assist for medications management;Direct supervision/assist for financial management;Assist for transportation;Help with stairs or ramp for entrance    Functional Status Assessment     Equipment Recommendations  None recommended by OT    Recommendations for Other Services       Precautions / Restrictions Precautions Precautions: Fall Restrictions Weight Bearing Restrictions: No      Mobility Bed Mobility Overal bed mobility: Modified Independent             General bed mobility comments: Coming to EOB witgh mod I this session.    Transfers Overall transfer level: Needs assistance Equipment used: Rolling walker (2 wheels) Transfers: Sit to/from Stand Sit to Stand: Min guard           General transfer comment: Min guard for safety. requiring increaed time      Balance Overall balance assessment: Needs assistance, History of Falls Sitting-balance support: No upper extremity supported, Feet supported Sitting balance-Leahy Scale: Good     Standing balance support: Bilateral upper extremity supported, Reliant on assistive device for balance Standing balance-Leahy Scale: Poor Standing balance comment: Reliant on RW                           ADL either performed or assessed with clinical judgement   ADL Overall ADL's : Needs assistance/impaired     Grooming: Set up;Sitting                           Tub/ Shower Transfer: Tub transfer;Min Psychologist, prison and probation services Details (indicate cue type and reason): Simulated tub/shower transfer  with min guard A. Encouraged pt to perform when someone is nearby when returning home. Functional mobility during ADLs: Min guard;Rolling walker (2 wheels) General ADL Comments: Based on pt report, feel  she is near her baseline.     Vision   Additional Comments: Pt reporting she usually wears reading glasses.     Perception Perception Perception: Within Functional Limits   Praxis Praxis Praxis: Intact    Pertinent Vitals/Pain Pain Assessment Pain Assessment: 0-10 Faces Pain Scale: Hurts worst Pain Location: Chest from chest compressions. Agreeable to participate in therapy, requesting that OT doublt check with RN after session when medication would arrive. Pain Descriptors / Indicators: Discomfort, Sore Pain Intervention(s): Monitored during session, Limited activity within patient's tolerance, Repositioned     Hand Dominance     Extremity/Trunk Assessment Upper Extremity Assessment Upper Extremity Assessment: Generalized weakness RUE Deficits / Details: Reporting recent wrist fracture ~2 months ago. Decreased grip strength. Reporting doctor has told her she can use as much as comfortable. ROM WFL   Lower Extremity Assessment Lower Extremity Assessment: Generalized weakness       Communication     Cognition Arousal/Alertness: Awake/alert Behavior During Therapy: WFL for tasks assessed/performed (Flat upon entry, but improving throughout session.) Overall Cognitive Status: No family/caregiver present to determine baseline cognitive functioning                                 General Comments: Performing short blessed test to assess delayed memory recall, problem solving, and orientation with a score of 0 indicating normal cognition. Observed to require increased time to name the months of the year in reverse order. Performing pill box test to assess safe medication management at home, organized thought patterns, processing, sequencing, and problem solving. Pt with use of magnifying glass due to glasses not present at hospital with her, and requesting assistance to open 2 containers. Increased time due to slower processing, slowed fine motor skills, and requests  for help to open pill bottles. Pt with skill to problem solve how she could prepare at home with aid present in the event of needing assistance to open pill bottles. Pt completed pill box assessment in 8 minutes and 30 seconds, however, no errors. Educated pt to have someone check back over her during medication management, financial management at this time as she continues to feel closer to her baseline.     General Comments  VSS    Exercises     Shoulder Instructions      Home Living                                          Prior Functioning/Environment                          OT Problem List:        OT Treatment/Interventions:      OT Goals(Current goals can be found in the care plan section) Acute Rehab OT Goals Patient Stated Goal: Go home OT Goal Formulation: With patient Time For Goal Achievement: 08/03/22 Potential to Achieve Goals: Good ADL Goals Pt Will Perform Grooming: with modified independence;standing Pt Will Perform Lower Body Dressing: with modified independence;sit to/from stand Pt Will Transfer to Toilet: with modified independence;ambulating;regular height toilet Pt Will Perform Toileting - Clothing Manipulation  and hygiene: with modified independence;sit to/from stand Pt Will Perform Tub/Shower Transfer: with modified independence;ambulating;shower seat;Tub transfer  OT Frequency: Min 2X/week    Co-evaluation              AM-PAC OT "6 Clicks" Daily Activity     Outcome Measure Help from another person eating meals?: A Little Help from another person taking care of personal grooming?: A Little Help from another person toileting, which includes using toliet, bedpan, or urinal?: A Little Help from another person bathing (including washing, rinsing, drying)?: A Little Help from another person to put on and taking off regular upper body clothing?: A Little Help from another person to put on and taking off regular lower body  clothing?: A Little 6 Click Score: 18   End of Session Equipment Utilized During Treatment: Rolling walker (2 wheels) Nurse Communication: Mobility status  Activity Tolerance: Patient tolerated treatment well Patient left: in bed;with call bell/phone within reach;with bed alarm set  OT Visit Diagnosis: Unsteadiness on feet (R26.81);Muscle weakness (generalized) (M62.81);Pain Pain - part of body:  (chest, sacrum)                Time: 1005-1030 OT Time Calculation (min): 25 min Charges:  OT General Charges $OT Visit: 1 Visit OT Treatments $Self Care/Home Management : 8-22 mins $Cognitive Funtion inital: Initial 15 mins  Ladene Artist, OTR/L [facility] Acute Rehabilitation Office: [phone]   Drue Novel 07/21/2022, 10:51 AM

## 2022-07-21 NOTE — Progress Notes (Signed)
PROGRESS NOTE  Jamie [name]  DOB: January 26, 1956  PCP: Christain Sacramento, MD JYN:[id number]  DOA: 07/18/2022  LOS: 3 days  Hospital Day: 4  Brief narrative: Jamie [name] is a 66 y.o. female with PMH significant for arthralgia, migraine headaches, chronic pain syndrome with prior back surgery, chronically on opiates, anxiety and insomnia chronically on benzodiazepine, alcohol abuse and history of noncompliance as well as h/o generalized weakness, recurrent falls, recently diagnosed UTI at outside facility, but did not take antibiotics because it made her sick. Per patient's daughter, patient abuses her pain medicines, drinks alcohol and smokes weed.  Patient lives at home and has a home health caregiver 5 days a week On 7/24, home health caregiver noted an episode in which patient had a stiff and jerking of her arms, legs and eyes that lasted 10 minutes.  Patient was brought into the ED with concern of seizure.  In the ED, patient was hemodynamically stable.  She was noted to have feces in her hands, thighs and legs.  Also noted to have red and open areas to her sacrum. Initial labs showed sodium low at 131, potassium low at 2.8, WBC count elevated to 22,000, blood alcohol level not elevated. Urinalysis showed cloudy yellow urine. UDS is positive for benzodiazepine and barbiturates.  After 2 hours in the ED, patient had a generalized tonic-clonic seizure followed by prolonged postictal state and subsequent agitation, restlessness and combativeness. Admitted to hospitalist service. She was loaded with IV Keppra and started on IV Rocephin for possible UTI. Transfer to [facility] for neurological evaluation and monitoring. MRI brain did not show any acute intracranial abnormality.  Showed moderately advanced chronic small vessel disease. EEG showed moderate diffuse encephalopathy of nonspecific etiology.  No seizures or epileptiform discharges were seen.  Subjective: Patient was seen and examined  this morning. Lying on bed.  Not in distress.  No new symptoms.  Wants to go home. Labs from this morning showed sudden elevation in LFTs,  Assessment and plan: Generalized tonic-clonic seizure  -Observed at home as well as in the ED. -UDS positive for benzodiazepine and barbiturates.  Blood alcohol level not elevated -Patient was loaded with Keppra in the ED. -MRI brain did not show acute findings.  EEG unremarkable -Neurology consulted.  Per neurology note from this morning, patient seems to have a provoked seizure and hence there is no need to continue Keppra. -Continue to follow seizure precautions   Recent UTI -Recently diagnosed UTI at outside facility 07/16/22. -Results from care everywhere showed 10,000 -50,000 CFU per mL of Proteus mirabilis. -Endorses dysuria. -Because of leukocytosis of 22,000, patient was started on IV Rocephin. -Repeat urine culture sent. Recent Labs  Lab 07/18/22 1625 07/18/22 2024 07/18/22 2132 07/19/22 0411 07/20/22 0326 07/21/22 0238  WBC 22.0*  --   --  13.9* 8.0 6.7  LATICACIDVEN  --  2.3* 1.7  --   --   --    Hypoglycemia -Blood sugar level was low at 68 this morning.  No history of diabetes mellitus -Started on D5 drip.  Continue same for next 24 hours.  Encourage oral intake Recent Labs  Lab 07/19/22 0710 07/19/22 0812  GLUCAP 68* 113*   Hyponatremia -Improved this morning. Recent Labs  Lab 07/18/22 1625 07/19/22 0411 07/20/22 0326 07/21/22 0238  NA 131* 131* 136 135   Hypokalemia/hypomagnesemia -Replacement given.  Potassium is still low this morning.  Oral replacement added. Recent Labs  Lab 07/18/22 1625 07/18/22 2113 07/19/22 0411 07/20/22 0326  07/21/22 0238  K 2.8*  --  3.6 3.1* 3.6  MG  --  1.6*  --   --   --    Elevated liver enzymes -AST/ALT elevated in last 24 hours.  Alk phos slightly elevated but stable.  Bilirubin normal -Patient reports history of hepatitis B in her 26s. -Acute overdose panel negative.   May have chronic hepatitis but unclear how and why are th LFTs up suddenly. -Repeat labs tomorrow Recent Labs  Lab 07/18/22 1625 07/19/22 0411 07/20/22 0326 07/21/22 0238  AST 13* 15 12* 206*  ALT $Re'22 17 14 'aln$ 111*  ALKPHOS 210* 167* 127* 155*  BILITOT 0.7 0.5 0.1* <0.1*  PROT 7.0 6.1* 5.0* 4.8*  ALBUMIN 3.5 3.1* 2.4* 2.3*    Polysubstance abuse -UDS positive for benzos and barbiturates.  -Per daughter, patient will use of pain medicines, drinks alcohol and smokes weed.  Patient however denies doing any of these. -Patient had previously refused rehab  arthralgia, migraine headaches, chronic pain syndrome with prior back surgery -Imaging also showed advanced cervical spine degeneration and mild spinal stenosis at C3-C4 -chronically on opiates  anxiety and insomnia  -chronically on benzodiazepine  Long-term disposition -Currently getting home health care.  Daughter believes she needs long-term care.  Patient has refused in the past  Goals of care   Code Status: Full Code    Mobility: PT eval ordered  Skin assessment:  Pressure Injury 05/19/21 Sacrum Stage 1 -  Intact skin with non-blanchable redness of a localized area usually over a bony prominence. blanchable redness (Active)  05/19/21 1400  Location: Sacrum  Location Orientation:   Staging: Stage 1 -  Intact skin with non-blanchable redness of a localized area usually over a bony prominence.  Wound Description (Comments): blanchable redness  Present on Admission: Yes    Nutritional status:  Body mass index is 18.7 kg/m.          Diet:  Diet Order             Diet Heart Room service appropriate? Yes; Fluid consistency: Thin  Diet effective now                   DVT prophylaxis:  heparin injection 5,000 Units Start: 07/18/22 2200 SCDs Start: 07/18/22 2113 Place TED hose Start: 07/18/22 2113   Antimicrobials: IV Rocephin Fluid: D5 NS at 46 mill per hour Consultants: Neurology Family  Communication: Had a long conversation with patient's daughter.  Updated her.  she will be visiting this afternoon and wants the nurse to be available to help her convince the patient to improve her behavior at home. Liver enzymes elevated today.  Unclear reason.  Acute hepatitis panel negative.  I cannot discharge her today.  We will repeat liver enzymes tomorrow.  If improving, will discharge.  Status is: Inpatient  Continue in-hospital care because: Repeat LFTs tomorrow Level of care: Telemetry Medical   Dispo: The patient is from: Home              Anticipated d/c is to: Pending clinical course.  Hopefully home tomorrow with home health              Patient currently is not medically stable to d/c.   Difficult to place patient No     Infusions:   sodium chloride     cefTRIAXone (ROCEPHIN)  IV 2 g (07/21/22 0907)    Scheduled Meds:  heparin  5,000 Units Subcutaneous Q8H   ondansetron (ZOFRAN) IV  4 mg Intravenous Once   pantoprazole  40 mg Oral Daily   sodium chloride flush  3 mL Intravenous Q12H    PRN meds: sodium chloride, acetaminophen **OR** acetaminophen, bisacodyl, butalbital-acetaminophen-caffeine, LORazepam, ondansetron **OR** ondansetron (ZOFRAN) IV, mouth rinse, oxyCODONE, polyethylene glycol, sodium chloride flush, traZODone   Antimicrobials: Anti-infectives (From admission, onward)    Start     Dose/Rate Route Frequency Ordered Stop   07/19/22 1000  cefTRIAXone (ROCEPHIN) 2 g in sodium chloride 0.9 % 100 mL IVPB        2 g 200 mL/hr over 30 Minutes Intravenous Every 24 hours 07/18/22 2032     07/18/22 2015  cefTRIAXone (ROCEPHIN) 1 g in sodium chloride 0.9 % 100 mL IVPB        1 g 200 mL/hr over 30 Minutes Intravenous  Once 07/18/22 2013 07/18/22 2114       Objective: Vitals:   07/21/22 0817 07/21/22 1143  BP: 127/79 125/60  Pulse: 75 77  Resp: 18 18  Temp: 98.4 F (36.9 C) 98.2 F (36.8 C)  SpO2: 98% 97%    Intake/Output Summary (Last 24  hours) at 07/21/2022 1430 Last data filed at 07/21/2022 0320 Gross per 24 hour  Intake 556.7 ml  Output 825 ml  Net -268.3 ml   Filed Weights   07/18/22 1406  Weight: 51 kg   Weight change:  Body mass index is 18.7 kg/m.   Physical Exam: General exam: Pleasant, elderly, not in pain Skin: No rashes, lesions or ulcers. HEENT: Atraumatic, normocephalic, no obvious bleeding Lungs: Clear to auscultation bilaterally CVS: Regular rate and rhythm, no murmur GI/Abd soft, no tenderness in epigastrium up right upper quadrant, nondistended, bowel sound present CNS: Alert, awake, oriented x3 Psychiatry: Mood appropriate Extremities: No pedal edema, no calf tenderness  Data Review: I have personally reviewed the laboratory data and studies available.  F/u labs ordered Unresulted Labs (From admission, onward)     Start     Ordered   07/20/22 0500  CBC  Daily at 5am,   R      07/19/22 0706   07/20/22 0500  Comprehensive metabolic panel  Daily at 5am,   R      07/19/22 0518            Signed, Terrilee Croak, MD [facility] 07/21/2022

## 2022-07-22 LAB — COMPREHENSIVE METABOLIC PANEL
ALT: 112 U/L — ABNORMAL HIGH (ref 0–44)
AST: 160 U/L — ABNORMAL HIGH (ref 15–41)
Albumin: 2.3 g/dL — ABNORMAL LOW (ref 3.5–5.0)
Alkaline Phosphatase: 159 U/L — ABNORMAL HIGH (ref 38–126)
Anion gap: 6 (ref 5–15)
BUN: 8 mg/dL (ref 8–23)
CO2: 27 mmol/L (ref 22–32)
Calcium: 8.8 mg/dL — ABNORMAL LOW (ref 8.9–10.3)
Chloride: 103 mmol/L (ref 98–111)
Creatinine, Ser: 0.74 mg/dL (ref 0.44–1.00)
GFR, Estimated: >60 mL/min (ref >60)
Glucose, Bld: 92 mg/dL (ref 70–99)
Potassium: 3.5 mmol/L (ref 3.5–5.1)
Sodium: 136 mmol/L (ref 135–145)
Total Bilirubin: 0.4 mg/dL (ref 0.3–1.2)
Total Protein: 4.9 g/dL — ABNORMAL LOW (ref 6.5–8.1)

## 2022-07-22 LAB — CBC
HCT: 31.7 % — ABNORMAL LOW (ref 36.0–46.0)
Hemoglobin: 10.2 g/dL — ABNORMAL LOW (ref 12.0–15.0)
MCH: 29.4 pg (ref 26.0–34.0)
MCHC: 32.2 g/dL (ref 30.0–36.0)
MCV: 91.4 fL (ref 80.0–100.0)
Platelets: 243 10*3/uL (ref 150–400)
RBC: 3.47 MIL/uL — ABNORMAL LOW (ref 3.87–5.11)
RDW: 13.6 % (ref 11.5–15.5)
WBC: 5.9 10*3/uL (ref 4.0–10.5)
nRBC: 0 % (ref 0.0–0.2)

## 2022-07-22 MED ORDER — OXYCODONE HCL 5 MG PO TABS: 5.0000 mg | ORAL_TABLET | Freq: Four times a day (QID) | ORAL | 0 refills | Status: AC | PRN

## 2022-07-22 NOTE — Progress Notes (Signed)
PT Cancellation Note  Patient Details Name: Jamie [name] MRN: [id number] DOB: 21-Aug-1956   Cancelled Treatment:    Reason Eval/Treat Not Completed: Patient declined, no reason specified; Patient reports plans for home today.  Reviewed safety precautions for fall prevention at home and pt voiced understanding.  Discussed calling family/friends for help as needed as can be overwhelming to take care of herself.  Declined to practice stair negotiation.  States MD arranging a ride home for her and she will get them to help her in the house.  Encouraged walker use full time as well for fall prevention. At high risk for readmission.  Noted pt refused SNF.    Elray Mcgregor 07/22/2022, 11:37 AM Sheran Lawless, PT Acute Rehabilitation Services Office:[phone] 07/22/2022

## 2022-07-22 NOTE — TOC Transition Note (Signed)
Transition of Care [facility]) - CM/SW Discharge Note   Patient Details  Name: DAZJA HOUCHIN MRN: [id number] Date of Birth: 1956/05/01  Transition of Care [facility]) CM/SW Contact:  Kermit Balo, RN Phone Number: 07/22/2022, 2:56 PM   Clinical Narrative:    Pt discharging home with self care. CM asked again about rehab and she refused. Home health changed to [facility]. Information on the AVS.  Daughter refusing to transport home. CM has arranged transport via [facility]. Pt has her keys for entry into the home.  No further needs per TOC.   Final next level of care: Home w Home Health Services Barriers to Discharge: No Barriers Identified   Patient Goals and CMS Choice   CMS Medicare.gov Compare Post Acute Care list provided to:: Patient Choice offered to / list presented to : Patient  Discharge Placement                       Discharge Plan and Services   Discharge Planning Services: CM Consult Post Acute Care Choice: Home Health                    HH Arranged: RN, PT, Social Work [employer] Agency: [facility] Date [facility] Agency Contacted: 07/22/22   Representative spoke with at [facility] Agency: Marylene Land with [employer]  Social Determinants of Health (SDOH) Interventions     Readmission Risk Interventions     No data to display

## 2022-07-22 NOTE — Care Management Important Message (Signed)
Important Message  Patient Details  Name: Jamie [name] MRN: [id number] Date of Birth: 1956/07/04   Medicare Important Message Given:  Yes     Dorena Bodo 07/22/2022, 3:32 PM

## 2022-07-22 NOTE — Discharge Summary (Signed)
Physician Discharge Summary  SHAR PAEZ ZSM:[id number] DOB: 03/25/1956 DOA: 07/18/2022  PCP: Christain Sacramento, MD  Admit date: 07/18/2022 Discharge date: 07/22/2022  Admitted From: Home Discharge disposition: Home with home health  Recommendations at discharge:  Minimize use of pain medicines Stop smoking, drinking Ensure compliance to regular medicines and follow-up Follow seizure precautions  Discharge instruction for Seizure:  Per [facility] statutes, patients with seizures are not allowed to drive until  they have been seizure-free for six months. Use caution when using heavy equipment or power tools. Avoid working on ladders or at heights. Take showers instead of baths. Ensure the water temperature is not too high on the home water heater. Do not go swimming alone. When caring for infants or small children, sit down when holding, feeding, or changing them to minimize risk of injury to the child in the event you have a seizure.  Maintain good sleep hygiene. Avoid alcohol.  Brief narrative: Jamie [name] is a 66 y.o. female with PMH significant for arthralgia, migraine headaches, chronic pain syndrome with prior back surgery, chronically on opiates, anxiety and insomnia chronically on benzodiazepine, alcohol abuse and history of noncompliance as well as h/o generalized weakness, recurrent falls, recently diagnosed UTI at outside facility, but did not take antibiotics because it made her sick. Per patient's daughter, patient abuses her pain medicines, drinks alcohol and smokes weed.  Patient lives at home and has a home health caregiver 5 days a week On 7/24, home health caregiver noted an episode in which patient had a stiff and jerking of her arms, legs and eyes that lasted 10 minutes.  Patient was brought into the ED with concern of seizure.  In the ED, patient was hemodynamically stable.  She was noted to have feces in her hands, thighs and legs.  Also noted to have red and open  areas to her sacrum. Initial labs showed sodium low at 131, potassium low at 2.8, WBC count elevated to 22,000, blood alcohol level not elevated. Urinalysis showed cloudy yellow urine. UDS is positive for benzodiazepine and barbiturates.  After 2 hours in the ED, patient had a generalized tonic-clonic seizure followed by prolonged postictal state and subsequent agitation, restlessness and combativeness. Admitted to hospitalist service. She was loaded with IV Keppra and started on IV Rocephin for possible UTI. Transfer to [facility] for neurological evaluation and monitoring. MRI brain did not show any acute intracranial abnormality.  Showed moderately advanced chronic small vessel disease. EEG showed moderate diffuse encephalopathy of nonspecific etiology.  No seizures or epileptiform discharges were seen.  Subjective: Patient was seen and examined this morning.  Lying on bed.  Not in distress.  Wants to go home. Labs from this morning show improvement in liver enzymes.  Hospital course: Generalized tonic-clonic seizure  -Observed at home as well as in the ED. -UDS positive for benzodiazepine and barbiturates.  Blood alcohol level not elevated -Patient was loaded with Keppra in the ED. -MRI brain did not show acute findings.  EEG unremarkable -Neurology consulted.  Per neurology note, patient seems to have a provoked seizure and hence there is no need to continue Keppra. -Continue to follow seizure precautions   Recent UTI -Recently diagnosed UTI at outside facility 07/16/22. -Results from care everywhere showed 10,000 -50,000 CFU per mL of Proteus mirabilis. -Endorses dysuria. -Because of leukocytosis of 22,000, patient was started on IV Rocephin.  Patient completed 3-day course of IV Rocephin.  No need of oral antibiotics at discharge.  Recent Labs  Lab 07/18/22 1625 07/18/22 2024 07/18/22 2132 07/19/22 0411 07/20/22 0326 07/21/22 0238 07/22/22 0458  WBC 22.0*  --   --  13.9*  8.0 6.7 5.9  LATICACIDVEN  --  2.3* 1.7  --   --   --   --    Hyponatremia -Improved with hydration Recent Labs  Lab 07/18/22 1625 07/19/22 0411 07/20/22 0326 07/21/22 0238 07/22/22 0458  NA 131* 131* 136 135 136   Hypokalemia/hypomagnesemia -Potassium and magnesium level improved with replacement. Recent Labs  Lab 07/18/22 1625 07/18/22 2113 07/19/22 0411 07/20/22 0326 07/21/22 0238 07/22/22 0458  K 2.8*  --  3.6 3.1* 3.6 3.5  MG  --  1.6*  --   --   --   --    Elevated liver enzymes -AST/ALT elevated in last 24 hours.  Alk phos slightly elevated but stable.  Bilirubin normal -Patient reports history of hepatitis B in her 81s. -Acute overdose panel negative.  May have chronic hepatitis but unclear how and why are the LFTs up suddenly.  Labs improving this morning.  Patient wants to go home.  She states she will follow-up with PCP next week for repeat labs. Recent Labs  Lab 07/18/22 1625 07/19/22 0411 07/20/22 0326 07/21/22 0238 07/22/22 0458  AST 13* 15 12* 206* 160*  ALT '22 17 14 ' 111* 112*  ALKPHOS 210* 167* 127* 155* 159*  BILITOT 0.7 0.5 0.1* <0.1* 0.4  PROT 7.0 6.1* 5.0* 4.8* 4.9*  ALBUMIN 3.5 3.1* 2.4* 2.3* 2.3*   Polysubstance abuse -UDS positive for benzos and barbiturates.  -Per daughter, patient will use of pain medicines, drinks alcohol and smokes weed.  Patient however denies doing any of these.  arthralgia, migraine headaches, chronic pain syndrome with prior back surgery -Imaging also showed advanced cervical spine degeneration and mild spinal stenosis at C3-C4 -chronically on opiates  anxiety and insomnia  -chronically on benzodiazepine  Long-term disposition -Currently getting home health care.  Daughter believes she needs long-term care.  Patient has refused in the past. Home with home health arranged.  Wounds:  - Pressure Injury 05/19/21 Sacrum Stage 1 -  Intact skin with non-blanchable redness of a localized area usually over a bony  prominence. blanchable redness (Active)  Date First Assessed/Time First Assessed: 05/19/21 1400   Location: Sacrum  Staging: Stage 1 -  Intact skin with non-blanchable redness of a localized area usually over a bony prominence.  Wound Description (Comments): blanchable redness  Present on Ad...    Assessments 07/19/2022  8:54 PM 07/21/2022  8:00 PM  Dressing Type -- Foam - Lift dressing to assess site every shift  Dressing -- Clean, Dry, Intact  State of Healing -- Early/partial granulation  Site / Wound Assessment -- Clean;Pink  Drainage Amount Scant None  Treatment -- Off loading     No associated orders.    Discharge Exam:   Vitals:   07/21/22 1614 07/21/22 1928 07/22/22 0455 07/22/22 0757  BP: 136/68 138/77 127/77 128/73  Pulse: 69 77 66 68  Resp: '18 17 18 15  ' Temp: 98.7 F (37.1 C) 98.2 F (36.8 C) 98.6 F (37 C) 98.6 F (37 C)  TempSrc: Oral Oral Oral Oral  SpO2: 99% 98% 96% 97%  Weight:      Height:        Body mass index is 18.7 kg/m.  General exam: Pleasant, elderly, not in pain Skin: No rashes, lesions or ulcers. HEENT: Atraumatic, normocephalic, no obvious bleeding Lungs: Clear to auscultation  bilaterally CVS: Regular rate and rhythm, no murmur GI/Abd soft, no tenderness in epigastrium up right upper quadrant, nondistended, bowel sound present CNS: Alert, awake, oriented x3 Psychiatry: Mood appropriate Extremities: No pedal edema, no calf tenderness  Follow ups:    Follow-up Information     [facility] Follow up.   Why: [phone] The home health agency will contact you for the first home visit.        Christain Sacramento, MD Follow up.   Specialty: Family Medicine Contact information: [address] [place] [address] [phone]                 Discharge Instructions:   Discharge Instructions     Call MD for:  difficulty breathing, headache or visual disturbances   Complete by: As directed    Call MD for:  extreme  fatigue   Complete by: As directed    Call MD for:  hives   Complete by: As directed    Call MD for:  persistant dizziness or light-headedness   Complete by: As directed    Call MD for:  persistant nausea and vomiting   Complete by: As directed    Call MD for:  severe uncontrolled pain   Complete by: As directed    Call MD for:  temperature >100.4   Complete by: As directed    Diet general   Complete by: As directed    Discharge instructions   Complete by: As directed    Recommendations at discharge:   Minimize use of pain medicines  Stop smoking, drinking  Ensure compliance to regular medicines and follow-up]  Seizure precautions: -Per [facility] statutes, patients with seizures are not allowed to drive until they have been seizure-free for six months.  -Use caution when using heavy equipment or power tools.  -Avoid working on ladders or at heights.  -Take showers instead of baths. Ensure the water temperature is not too high on the home water heater.  -Do not go swimming alone.  -Do not lock yourself in a room alone (i.e. bathroom). -When caring for infants or small children, sit down when holding, feeding, or changing them to minimize risk of injury to the child in the event you have a seizure.  -Maintain good sleep hygiene. -Avoid alcohol.    If patient has another seizure, call 911 and bring them back to the ED if: A.  The seizure lasts longer than 5 minutes.      B.  The patient doesn't wake shortly after the seizure or has new problems such as difficulty seeing, speaking or moving following the seizure C.  The patient was injured during the seizure D.  The patient has a temperature over 102 F (39C) E.  The patient vomited during the seizure and now is having trouble breathing    General discharge instructions: Follow with Primary MD Christain Sacramento, MD in 7 days  Please request your PCP  to go over your hospital tests, procedures, radiology results at the  follow up. Please get your medicines reviewed and adjusted.  Your PCP may decide to repeat certain labs or tests as needed. Do not drive, operate heavy machinery, perform activities at heights, swimming or participation in water activities or provide baby sitting services if your were admitted for syncope or siezures until you have seen by Primary MD or a Neurologist and advised to do so again. [place] Controlled Substance Reporting System database was reviewed.  Do not drive, operate heavy machinery, perform activities at heights, swim, participate in water activities or provide baby-sitting services while on medications for pain, sleep and mood until your outpatient physician has reevaluated you and advised to do so again.  You are strongly recommended to comply with the dose, frequency and duration of prescribed medications. Activity: As tolerated with Full fall precautions use walker/cane & assistance as needed Avoid using any recreational substances like cigarette, tobacco, alcohol, or non-prescribed drug. If you experience worsening of your admission symptoms, develop shortness of breath, life threatening emergency, suicidal or homicidal thoughts you must seek medical attention immediately by calling 911 or calling your MD immediately  if symptoms less severe. You must read complete instructions/literature along with all the possible adverse reactions/side effects for all the medicines you take and that have been prescribed to you. Take any new medicine only after you have completely understood and accepted all the possible adverse reactions/side effects.  Wear Seat belts while driving. You were cared for by a hospitalist during your hospital stay. If you have any questions about your discharge medications or the care you received while you were in the hospital after you are discharged, you can call the unit and ask to speak with the hospitalist or the covering physician. Once you are discharged,  your primary care physician will handle any further medical issues. Please note that NO REFILLS for any discharge medications will be authorized once you are discharged, as it is imperative that you return to your primary care physician (or establish a relationship with a primary care physician if you do not have one).   Discharge wound care:   Complete by: As directed    Increase activity slowly   Complete by: As directed        Discharge Medications:   Allergies as of 07/22/2022       Reactions   Amoxicillin-pot Clavulanate Nausea And Vomiting   Can tolerate plain Amoxicillin   Azithromycin Nausea And Vomiting   Other reaction(s): Other (See Comments) unknown   Bupropion Nausea And Vomiting   Nsaids Other (See Comments)   Stomach problems   Prednisone Diarrhea   Vilazodone Diarrhea        Medication List     STOP taking these medications    cephALEXin 500 MG capsule Commonly known as: KEFLEX   potassium chloride 10 MEQ tablet Commonly known as: KLOR-CON       TAKE these medications    ALPRAZolam 1 MG tablet Commonly known as: XANAX Take 1 tablet (1 mg total) by mouth 3 (three) times daily as needed for anxiety or sleep.   Aspirin-Caffeine 845-65 MG Pack Take by mouth.   butalbital-acetaminophen-caffeine 50-325-40 MG tablet Commonly known as: FIORICET Take 1-2 tablets by mouth 2 (two) times daily as needed for headache.   loperamide 2 MG capsule Commonly known as: IMODIUM Take 2 mg by mouth every 6 (six) hours as needed.   OVER THE COUNTER MEDICATION calcium   oxyCODONE 5 MG immediate release tablet Commonly known as: Roxicodone Take 1 tablet (5 mg total) by mouth every 6 (six) hours as needed for up to 3 days for severe pain. What changed:  when to take this Another medication with the same name was removed. Continue taking this medication, and follow the directions you see here.   pantoprazole 40 MG tablet Commonly known as: PROTONIX Take 40 mg  by mouth daily.   tiZANidine 4 MG tablet Commonly known as: ZANAFLEX Take 4  mg by mouth 3 (three) times daily.   traZODone 100 MG tablet Commonly known as: DESYREL Take 100 mg by mouth at bedtime.               Discharge Care Instructions  (From admission, onward)           Start     Ordered   07/22/22 0000  Discharge wound care:        07/22/22 1311             The results of significant diagnostics from this hospitalization (including imaging, microbiology, ancillary and laboratory) are listed below for reference.    Procedures and Diagnostic Studies:   EEG adult  Result Date: 07/19/2022 Lora Havens, MD     07/19/2022  4:58 PM Patient Name: Jamie [name] MRN: [id number] Epilepsy Attending: Lora Havens Referring Physician/Provider: Roxan Hockey, MD Date: 07/19/2022 Duration: 23.26 mins Patient history: 66yo F with recurrent seizure like activity. EEG to evaluate for seizure. Level of alertness: Awake AEDs during EEG study: Ativan Technical aspects: This EEG study was done with scalp electrodes positioned according to the 10-20 International system of electrode placement. Electrical activity was acquired at a sampling rate of '500Hz'  and reviewed with a high frequency filter of '70Hz'  and a low frequency filter of '1Hz' . EEG data were recorded continuously and digitally stored. Description: No clear posterior dominant rhythm was seen. EEG showed continuous generalized polymorphic 3 to 6 Hz theta-delta slowing admixed with 15 to 18 Hz beta activity. Hyperventilation and photic stimulation were not performed.   ABNORMALITY - Continuous slow, generalized IMPRESSION: This study is suggestive of moderate diffuse encephalopathy, nonspecific etiology. No seizures or epileptiform discharges were seen throughout the recording. Lora Havens   MR BRAIN WO CONTRAST  Result Date: 07/19/2022 CLINICAL DATA:  66 year old female with altered mental status. Seizure. EXAM: MRI  HEAD WITHOUT CONTRAST TECHNIQUE: Multiplanar, multiecho pulse sequences of the brain and surrounding structures were obtained without intravenous contrast. COMPARISON:  Head CT yesterday, and earlier. FINDINGS: Study is intermittently degraded by motion artifact despite repeated imaging attempts. Brain: No restricted diffusion to suggest acute infarction. No midline shift, mass effect, evidence of mass lesion, ventriculomegaly, extra-axial collection or acute intracranial hemorrhage. Cervicomedullary junction and pituitary are within normal limits. Scattered Patchy and confluent bilateral cerebral white matter T2 and FLAIR hyperintensity is in a nonspecific configuration, moderately advanced for age. No superimposed cortical encephalomalacia or chronic cerebral blood products identified. Deep gray matter nuclei, brainstem, and cerebellum appear spared. On routine coronal T2 imaging the hippocampal formations (series 14, image 15) at other mesial temporal lobe structures appear within normal limits. Vascular: Major intracranial vascular flow voids are grossly preserved. Skull and upper cervical spine: Advanced cervical spine degeneration visible at C3-C4. At least mild spinal stenosis there. Visualized bone marrow signal is within normal limits. Sinuses/Orbits: Negative. Other: Mild bilateral mastoid air cell effusions appear to be chronic and not significantly changed from last year. Negative visible scalp and face. IMPRESSION: 1. No acute intracranial abnormality on mildly motion degraded exam. 2. Moderately advanced for age cerebral white matter signal changes are nonspecific, but most commonly due to chronic small vessel disease. 3. 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 Electronically Signed   By: Genevie Ann M.D.   On: 07/19/2022 10:17     Labs:   Basic Metabolic Panel: Recent Labs  Lab 07/18/22 1625 07/18/22 2113 07/19/22 0411 07/20/22 0326 07/21/22 0238  07/22/22  0458  NA 131*  --  131* 136 135 136  K 2.8*  --  3.6 3.1* 3.6 3.5  CL 94*  --  99 104 107 103  CO2 27  --  '25 27 25 27  ' GLUCOSE 111*  --  89 97 98 92  BUN 5*  --  5* 5* 7* 8  CREATININE 0.44  --  0.50 0.63 0.57 0.74  CALCIUM 9.6  --  9.0 8.5* 8.6* 8.8*  MG  --  1.6*  --   --   --   --    GFR Estimated Creatinine Clearance: 54.9 mL/min (by C-G formula based on SCr of 0.74 mg/dL). Liver Function Tests: Recent Labs  Lab 07/18/22 1625 07/19/22 0411 07/20/22 0326 07/21/22 0238 07/22/22 0458  AST 13* 15 12* 206* 160*  ALT '22 17 14 ' 111* 112*  ALKPHOS 210* 167* 127* 155* 159*  BILITOT 0.7 0.5 0.1* <0.1* 0.4  PROT 7.0 6.1* 5.0* 4.8* 4.9*  ALBUMIN 3.5 3.1* 2.4* 2.3* 2.3*   No results for input(s): "LIPASE", "AMYLASE" in the last 168 hours. No results for input(s): "AMMONIA" in the last 168 hours. Coagulation profile No results for input(s): "INR", "PROTIME" in the last 168 hours.  CBC: Recent Labs  Lab 07/18/22 1625 07/19/22 0411 07/20/22 0326 07/21/22 0238 07/22/22 0458  WBC 22.0* 13.9* 8.0 6.7 5.9  NEUTROABS 19.0*  --   --   --   --   HGB 14.6 12.1 11.0* 10.6* 10.2*  HCT 43.1 37.7 32.4* 32.1* 31.7*  MCV 87.4 92.6 88.5 90.2 91.4  PLT 547* 311 227 230 243   Cardiac Enzymes: Recent Labs  Lab 07/18/22 2113  CKTOTAL 29*   BNP: Invalid input(s): "POCBNP" CBG: Recent Labs  Lab 07/19/22 0710 07/19/22 0812  GLUCAP 68* 113*   D-Dimer No results for input(s): "DDIMER" in the last 72 hours. Hgb A1c No results for input(s): "HGBA1C" in the last 72 hours. Lipid Profile No results for input(s): "CHOL", "HDL", "LDLCALC", "TRIG", "CHOLHDL", "LDLDIRECT" in the last 72 hours. Thyroid function studies No results for input(s): "TSH", "T4TOTAL", "T3FREE", "THYROIDAB" in the last 72 hours.  Invalid input(s): "FREET3" Anemia work up No results for input(s): "VITAMINB12", "FOLATE", "FERRITIN", "TIBC", "IRON", "RETICCTPCT" in the last 72 hours. Microbiology Recent  Results (from the past 240 hour(s))  Culture, blood (routine x 2)     Status: None (Preliminary result)   Collection Time: 07/18/22  8:24 PM   Specimen: Right Antecubital; Blood  Result Value Ref Range Status   Specimen Description RIGHT ANTECUBITAL  Final   Special Requests BOTTLES DRAWN AEROBIC AND ANAEROBIC BCAV  Final   Culture   Final    NO GROWTH 4 DAYS Performed at [facility], [address] [place], [place] [address]    Report Status PENDING  Incomplete  Urine Culture     Status: Abnormal   Collection Time: 07/18/22  8:42 PM   Specimen: Urine, Clean Catch  Result Value Ref Range Status   Specimen Description   Final    URINE, CLEAN CATCH Performed at [facility], [address] [place], [place] [address]    Special Requests   Final    NONE Performed at [facility], [address] [place], [place] [address]    Culture MULTIPLE SPECIES PRESENT, SUGGEST RECOLLECTION (A)  Final   Report Status 07/20/2022 FINAL  Final  Culture, blood (Routine X 2) w Reflex to ID Panel     Status: None (Preliminary result)  Collection Time: 07/18/22  9:32 PM   Specimen: BLOOD  Result Value Ref Range Status   Specimen Description BLOOD BLOOD LEFT HAND  Final   Special Requests   Final    BOTTLES DRAWN AEROBIC AND ANAEROBIC Blood Culture adequate volume   Culture   Final    NO GROWTH 4 DAYS Performed at [facility], [address], [place], [place] [address]    Report Status PENDING  Incomplete    Time coordinating discharge: 35 minutes  Signed: Devlynn Knoff  [facility] 07/22/2022, 1:11 PM

## 2022-07-23 LAB — CULTURE, BLOOD (ROUTINE X 2)
Culture: NO GROWTH
Culture: NO GROWTH
Special Requests: ADEQUATE

## 2022-07-27 ENCOUNTER — Other Ambulatory Visit: Payer: Self-pay

## 2022-07-27 ENCOUNTER — Emergency Department (HOSPITAL_COMMUNITY): Payer: Medicare HMO

## 2022-07-27 ENCOUNTER — Encounter (HOSPITAL_COMMUNITY): Payer: Self-pay | Admitting: Emergency Medicine

## 2022-07-27 ENCOUNTER — Inpatient Hospital Stay (HOSPITAL_COMMUNITY)
Admission: EM | Admit: 2022-07-27 | Discharge: 2022-07-30 | DRG: 308 | Disposition: A | Discharge status: Home Health | Payer: Medicare HMO | Attending: Internal Medicine | Admitting: Internal Medicine

## 2022-07-27 DIAGNOSIS — G40909 Epilepsy, unspecified, not intractable, without status epilepticus: Secondary | ICD-10-CM | POA: Diagnosis present

## 2022-07-27 DIAGNOSIS — Z880 Allergy status to penicillin: Secondary | ICD-10-CM

## 2022-07-27 DIAGNOSIS — R5381 Other malaise: Secondary | ICD-10-CM | POA: Diagnosis present

## 2022-07-27 DIAGNOSIS — G4700 Insomnia, unspecified: Secondary | ICD-10-CM | POA: Diagnosis present

## 2022-07-27 DIAGNOSIS — R42 Dizziness and giddiness: Secondary | ICD-10-CM

## 2022-07-27 DIAGNOSIS — E860 Dehydration: Secondary | ICD-10-CM | POA: Diagnosis present

## 2022-07-27 DIAGNOSIS — I10 Essential (primary) hypertension: Secondary | ICD-10-CM | POA: Diagnosis present

## 2022-07-27 DIAGNOSIS — Z886 Allergy status to analgesic agent status: Secondary | ICD-10-CM

## 2022-07-27 DIAGNOSIS — Z888 Allergy status to other drugs, medicaments and biological substances status: Secondary | ICD-10-CM

## 2022-07-27 DIAGNOSIS — R579 Shock, unspecified: Secondary | ICD-10-CM | POA: Diagnosis present

## 2022-07-27 DIAGNOSIS — G8929 Other chronic pain: Secondary | ICD-10-CM

## 2022-07-27 DIAGNOSIS — E872 Acidosis, unspecified: Secondary | ICD-10-CM | POA: Diagnosis present

## 2022-07-27 DIAGNOSIS — F419 Anxiety disorder, unspecified: Secondary | ICD-10-CM | POA: Diagnosis present

## 2022-07-27 DIAGNOSIS — R001 Bradycardia, unspecified: Secondary | ICD-10-CM | POA: Diagnosis not present

## 2022-07-27 DIAGNOSIS — F32A Depression, unspecified: Secondary | ICD-10-CM | POA: Diagnosis present

## 2022-07-27 DIAGNOSIS — Z79899 Other long term (current) drug therapy: Secondary | ICD-10-CM

## 2022-07-27 DIAGNOSIS — K529 Noninfective gastroenteritis and colitis, unspecified: Secondary | ICD-10-CM | POA: Diagnosis present

## 2022-07-27 DIAGNOSIS — R319 Hematuria, unspecified: Secondary | ICD-10-CM | POA: Diagnosis present

## 2022-07-27 DIAGNOSIS — R9431 Abnormal electrocardiogram [ECG] [EKG]: Secondary | ICD-10-CM | POA: Diagnosis present

## 2022-07-27 DIAGNOSIS — N390 Urinary tract infection, site not specified: Secondary | ICD-10-CM | POA: Diagnosis present

## 2022-07-27 DIAGNOSIS — Y929 Unspecified place or not applicable: Secondary | ICD-10-CM

## 2022-07-27 DIAGNOSIS — Z91148 Patient's other noncompliance with medication regimen for other reason: Secondary | ICD-10-CM

## 2022-07-27 DIAGNOSIS — W19XXXA Unspecified fall, initial encounter: Secondary | ICD-10-CM | POA: Diagnosis present

## 2022-07-27 DIAGNOSIS — F1910 Other psychoactive substance abuse, uncomplicated: Secondary | ICD-10-CM | POA: Diagnosis present

## 2022-07-27 DIAGNOSIS — G894 Chronic pain syndrome: Secondary | ICD-10-CM | POA: Diagnosis present

## 2022-07-27 DIAGNOSIS — L8990 Pressure ulcer of unspecified site, unspecified stage: Secondary | ICD-10-CM | POA: Insufficient documentation

## 2022-07-27 DIAGNOSIS — J9811 Atelectasis: Secondary | ICD-10-CM | POA: Diagnosis present

## 2022-07-27 DIAGNOSIS — R571 Hypovolemic shock: Secondary | ICD-10-CM | POA: Diagnosis present

## 2022-07-27 DIAGNOSIS — E876 Hypokalemia: Secondary | ICD-10-CM | POA: Diagnosis present

## 2022-07-27 LAB — COMPREHENSIVE METABOLIC PANEL
ALT: 27 U/L (ref 0–44)
AST: 16 U/L (ref 15–41)
Albumin: 3.6 g/dL (ref 3.5–5.0)
Alkaline Phosphatase: 170 U/L — ABNORMAL HIGH (ref 38–126)
Anion gap: 12 (ref 5–15)
BUN: 14 mg/dL (ref 8–23)
CO2: 21 mmol/L — ABNORMAL LOW (ref 22–32)
Calcium: 9.7 mg/dL (ref 8.9–10.3)
Chloride: 100 mmol/L (ref 98–111)
Creatinine, Ser: 0.9 mg/dL (ref 0.44–1.00)
GFR, Estimated: >60 mL/min (ref >60)
Glucose, Bld: 111 mg/dL — ABNORMAL HIGH (ref 70–99)
Potassium: 3.6 mmol/L (ref 3.5–5.1)
Sodium: 133 mmol/L — ABNORMAL LOW (ref 135–145)
Total Bilirubin: 0.8 mg/dL (ref 0.3–1.2)
Total Protein: 7.1 g/dL (ref 6.5–8.1)

## 2022-07-27 LAB — CBC WITH DIFFERENTIAL/PLATELET
Abs Immature Granulocytes: 0.08 10*3/uL — ABNORMAL HIGH (ref 0.00–0.07)
Basophils Absolute: 0.2 10*3/uL — ABNORMAL HIGH (ref 0.0–0.1)
Basophils Relative: 1 %
Eosinophils Absolute: 0.3 10*3/uL (ref 0.0–0.5)
Eosinophils Relative: 2 %
HCT: 43.6 % (ref 36.0–46.0)
Hemoglobin: 14.1 g/dL (ref 12.0–15.0)
Immature Granulocytes: 1 %
Lymphocytes Relative: 27 %
Lymphs Abs: 4 10*3/uL (ref 0.7–4.0)
MCH: 29.1 pg (ref 26.0–34.0)
MCHC: 32.3 g/dL (ref 30.0–36.0)
MCV: 89.9 fL (ref 80.0–100.0)
Monocytes Absolute: 0.9 10*3/uL (ref 0.1–1.0)
Monocytes Relative: 6 %
Neutro Abs: 9.3 10*3/uL — ABNORMAL HIGH (ref 1.7–7.7)
Neutrophils Relative %: 63 %
Platelets: 526 10*3/uL — ABNORMAL HIGH (ref 150–400)
RBC: 4.85 MIL/uL (ref 3.87–5.11)
RDW: 13.6 % (ref 11.5–15.5)
WBC: 14.8 10*3/uL — ABNORMAL HIGH (ref 4.0–10.5)
nRBC: 0 % (ref 0.0–0.2)

## 2022-07-27 LAB — URINALYSIS, ROUTINE W REFLEX MICROSCOPIC
Bacteria, UA: NONE SEEN
Bilirubin Urine: NEGATIVE
Glucose, UA: NEGATIVE mg/dL
Hgb urine dipstick: NEGATIVE
Ketones, ur: NEGATIVE mg/dL
Nitrite: NEGATIVE
Protein, ur: 30 mg/dL — AB
Specific Gravity, Urine: 1.017 (ref 1.005–1.030)
Trans Epithel, UA: 4
pH: 7 (ref 5.0–8.0)

## 2022-07-27 LAB — MAGNESIUM: Magnesium: 2.2 mg/dL (ref 1.7–2.4)

## 2022-07-27 LAB — TROPONIN I (HIGH SENSITIVITY): Troponin I (High Sensitivity): 4 ng/L (ref <18)

## 2022-07-27 LAB — TSH: TSH: 1.048 u[IU]/mL (ref 0.350–4.500)

## 2022-07-27 MED ORDER — ALPRAZOLAM 0.5 MG PO TABS
0.5000 mg | ORAL_TABLET | Freq: Three times a day (TID) | ORAL | Status: DC | PRN
  Administered 2022-07-29 – 2022-07-30 (×4): 0.5 mg via ORAL
  Filled 2022-07-27 (×4): qty 1

## 2022-07-27 MED ORDER — TRAZODONE HCL 50 MG PO TABS: 100.0000 mg | ORAL_TABLET | Freq: Every day | ORAL | Status: DC

## 2022-07-27 MED ORDER — MIDODRINE HCL 5 MG PO TABS
10.0000 mg | ORAL_TABLET | Freq: Three times a day (TID) | ORAL | Status: DC
  Administered 2022-07-28 (×3): 10 mg via ORAL
  Filled 2022-07-27 (×3): qty 2

## 2022-07-27 MED ORDER — SODIUM CHLORIDE 0.9 % IV BOLUS
500.0000 mL | Freq: Once | INTRAVENOUS | Status: AC
  Administered 2022-07-28: 500 mL via INTRAVENOUS

## 2022-07-27 MED ORDER — ACETAMINOPHEN 650 MG RE SUPP: 650.0000 mg | Freq: Four times a day (QID) | RECTAL | Status: DC | PRN

## 2022-07-27 MED ORDER — SODIUM CHLORIDE 0.9 % IV SOLN
1.0000 g | INTRAVENOUS | Status: DC
  Administered 2022-07-28 – 2022-07-29 (×3): 1 g via INTRAVENOUS
  Filled 2022-07-27 (×3): qty 10

## 2022-07-27 MED ORDER — ONDANSETRON HCL 4 MG/2ML IJ SOLN
4.0000 mg | Freq: Four times a day (QID) | INTRAMUSCULAR | Status: DC | PRN
  Administered 2022-07-28: 4 mg via INTRAVENOUS
  Filled 2022-07-27: qty 2

## 2022-07-27 MED ORDER — OXYCODONE-ACETAMINOPHEN 5-325 MG PO TABS
1.0000 | ORAL_TABLET | Freq: Once | ORAL | Status: AC
  Administered 2022-07-27: 1 via ORAL
  Filled 2022-07-27: qty 1

## 2022-07-27 MED ORDER — SODIUM CHLORIDE 0.9 % IV BOLUS: 500.0000 mL | Freq: Once | INTRAVENOUS | Status: DC

## 2022-07-27 MED ORDER — ONDANSETRON HCL 4 MG PO TABS
4.0000 mg | ORAL_TABLET | Freq: Four times a day (QID) | ORAL | Status: DC | PRN
  Administered 2022-07-28 – 2022-07-29 (×2): 4 mg via ORAL
  Filled 2022-07-27 (×3): qty 1

## 2022-07-27 MED ORDER — ACETAMINOPHEN 325 MG PO TABS
650.0000 mg | ORAL_TABLET | Freq: Four times a day (QID) | ORAL | Status: DC | PRN
  Administered 2022-07-28: 650 mg via ORAL
  Filled 2022-07-27: qty 2

## 2022-07-27 MED ORDER — MECLIZINE HCL 12.5 MG PO TABS
25.0000 mg | ORAL_TABLET | Freq: Once | ORAL | Status: AC
  Administered 2022-07-27: 25 mg via ORAL
  Filled 2022-07-27: qty 2

## 2022-07-27 MED ORDER — HEPARIN SODIUM (PORCINE) 5000 UNIT/ML IJ SOLN
5000.0000 [IU] | Freq: Three times a day (TID) | INTRAMUSCULAR | Status: DC
  Administered 2022-07-28: 5000 [IU] via SUBCUTANEOUS
  Filled 2022-07-27: qty 1

## 2022-07-27 MED ORDER — SODIUM CHLORIDE 0.9 % IV BOLUS
500.0000 mL | Freq: Once | INTRAVENOUS | Status: AC
Start: 2022-07-27 — End: 2022-07-27
  Administered 2022-07-27: 500 mL via INTRAVENOUS

## 2022-07-27 MED ORDER — OXYCODONE HCL 5 MG PO TABS
5.0000 mg | ORAL_TABLET | ORAL | Status: DC | PRN
  Administered 2022-07-28 – 2022-07-29 (×5): 5 mg via ORAL
  Filled 2022-07-27 (×5): qty 1

## 2022-07-27 MED ORDER — MORPHINE SULFATE (PF) 2 MG/ML IV SOLN
2.0000 mg | INTRAVENOUS | Status: DC | PRN
  Administered 2022-07-29: 2 mg via INTRAVENOUS
  Filled 2022-07-27: qty 1

## 2022-07-27 NOTE — ED Notes (Signed)
Pt unable to ambulate. 

## 2022-07-27 NOTE — ED Triage Notes (Signed)
Pt to the ED via [facility] with the complaint of generalized pain after running out of her pain medication.

## 2022-07-27 NOTE — ED Provider Notes (Signed)
[facility] EMERGENCY DEPARTMENT Provider Note   CSN: [id number] Arrival date & time: 07/27/22  1442     History  Chief Complaint  Patient presents with   Generalized Body Aches    Jamie [name] is a 66 y.o. female.  HPI Patient complains of headache and dizziness.  She also has chest pain and leg pain.  She states she cannot walk because of her chronic disability and pain.  She states she is out of her chronic narcotic medication because she cannot get to her doctor's office to get the prescription filled.  She went to an outside hospital, on 07/22/2022 and was given 12 oxycodone tablets to tide her over.  She has run out of these now.  She denies fever, cough, nausea or vomiting.    Home Medications Prior to Admission medications   Medication Sig Start Date End Date Taking? Authorizing Provider  ALPRAZolam Prudy Feeler) 1 MG tablet Take 1 tablet (1 mg total) by mouth 3 (three) times daily as needed for anxiety or sleep. 05/17/22  Yes Mancel Bale, MD  butalbital-acetaminophen-caffeine (FIORICET) [phone] MG tablet Take 1-2 tablets by mouth 2 (two) times daily as needed for headache. 05/17/22  Yes Mancel Bale, MD  diphenhydrAMINE HCl, Sleep, (ZZZQUIL) 50 MG/30ML LIQD Take by mouth.   Yes [provider]  loperamide (IMODIUM) 2 MG capsule Take 2 mg by mouth every 6 (six) hours as needed. 05/09/22  Yes [provider]  OVER THE COUNTER MEDICATION calcium   Yes [provider]  tiZANidine (ZANAFLEX) 4 MG tablet Take 4 mg by mouth 3 (three) times daily. 06/06/22  Yes [provider]  traZODone (DESYREL) 100 MG tablet Take 100 mg by mouth at bedtime. 03/17/22  Yes [provider]  pantoprazole (PROTONIX) 40 MG tablet Take 40 mg by mouth daily. Patient not taking: Reported on 07/27/2022 06/21/22   [provider]  potassium chloride (MICRO-K) 10 MEQ CR capsule Take 10 mEq by mouth 2 (two) times daily. Patient not taking: Reported on 07/27/2022  07/17/22   [provider]      Allergies    Amoxicillin-pot clavulanate, Azithromycin, Bupropion, Nsaids, Prednisone, and Vilazodone    Review of Systems   Review of Systems  Physical Exam Updated Vital Signs BP (!) 108/59   Pulse (!) 38   Temp 97.7 F (36.5 C) (Oral)   Resp 13   Ht 5\' 5"  (1.651 m)   Wt 51 kg   SpO2 93%   BMI 18.70 kg/m  Physical Exam Vitals and nursing note reviewed.  Constitutional:      General: She is not in acute distress.    Appearance: She is well-developed. She is not ill-appearing, toxic-appearing or diaphoretic.  HENT:     Head: Normocephalic and atraumatic.     Right Ear: External ear normal.     Left Ear: External ear normal.  Eyes:     Conjunctiva/sclera: Conjunctivae normal.     Pupils: Pupils are equal, round, and reactive to light.  Neck:     Trachea: Phonation normal.  Cardiovascular:     Rate and Rhythm: Normal rate and regular rhythm.     Heart sounds: Normal heart sounds.  Pulmonary:     Effort: Pulmonary effort is normal.     Breath sounds: Normal breath sounds. No stridor.  Chest:     Chest wall: Tenderness (Left anterior chest wall is very tender to palpation without obvious deformity or skin lesion.) present.  Abdominal:  General: There is no distension.     Palpations: Abdomen is soft.     Tenderness: There is no abdominal tenderness.  Musculoskeletal:     Cervical back: Normal range of motion and neck supple.     Comments: Patient has tenderness with passive range of motion of the hips and knees bilaterally but there are no deformities of either.  Normal range of motion arms.  Skin:    General: Skin is warm and dry.  Neurological:     Mental Status: She is alert and oriented to person, place, and time.     Cranial Nerves: No cranial nerve deficit.     Sensory: No sensory deficit.     Motor: No abnormal muscle tone.     Coordination: Coordination normal.  Psychiatric:        Mood and Affect: Mood normal.         Behavior: Behavior normal.        Thought Content: Thought content normal.        Judgment: Judgment normal.     ED Results / Procedures / Treatments   Labs (all labs ordered are listed, but only abnormal results are displayed) Labs Reviewed  COMPREHENSIVE METABOLIC PANEL - Abnormal; Notable for the following components:      Result Value   Sodium 133 (*)    CO2 21 (*)    Glucose, Bld 111 (*)    Alkaline Phosphatase 170 (*)    All other components within normal limits  CBC WITH DIFFERENTIAL/PLATELET - Abnormal; Notable for the following components:   WBC 14.8 (*)    Platelets 526 (*)    Neutro Abs 9.3 (*)    Basophils Absolute 0.2 (*)    Abs Immature Granulocytes 0.08 (*)    All other components within normal limits  URINALYSIS, ROUTINE W REFLEX MICROSCOPIC - Abnormal; Notable for the following components:   Protein, ur 30 (*)    Leukocytes,Ua SMALL (*)    All other components within normal limits  MAGNESIUM  TSH  TROPONIN I (HIGH SENSITIVITY)    EKG EKG Interpretation  Date/Time:  Wednesday July 27 2022 19:53:27 EDT Ventricular Rate:  39 PR Interval:  198 QRS Duration: 88 QT Interval:  613 QTC Calculation: 494 R Axis:   88 Text Interpretation: Sinus bradycardia Borderline right axis deviation Borderline ST depression, lateral leads Minimal ST elevation, inferior leads Borderline prolonged QT interval Since last tracing of earlier today No significant change was found Confirmed by Mancel Bale [phone]) on 07/27/2022 8:22:44 PM  Radiology DG Chest 2 View  Result Date: 07/27/2022 CLINICAL DATA:  Dizzy and generalized pain EXAM: CHEST - 2 VIEW COMPARISON:  Radiographs 07/18/2022 FINDINGS: Stable cardiomediastinal silhouette. Right-greater-than-left basilar atelectasis. No pleural effusion or pneumothorax. IMPRESSION: Right basilar airspace opacity favored to represent atelectasis. Electronically Signed   By: Minerva Fester M.D.   On: 07/27/2022 19:36     Procedures Procedures    Medications Ordered in ED Medications  sodium chloride 0.9 % bolus 500 mL (has no administration in time range)  sodium chloride 0.9 % bolus 500 mL (0 mLs Intravenous Stopped 07/27/22 1747)  oxyCODONE-acetaminophen (PERCOCET/ROXICET) 5-325 MG per tablet 1 tablet (1 tablet Oral Given 07/27/22 1815)  meclizine (ANTIVERT) tablet 25 mg (25 mg Oral Given 07/27/22 1815)    ED Course/ Medical Decision Making/ A&P                           Medical  Decision Making Patient presenting with dizziness, nonspecific, ongoing and worsening.  She has been having trouble walking to feed herself and get to the bathroom.  She has been unable to get to see her doctor because of this discomfort, for several weeks.  She is therefore not been able to get refill of her chronic pain medication.  No other specific illnesses.  Problems Addressed: Chronic pain syndrome: chronic illness or injury Dizziness: acute illness or injury Sinus bradycardia: acute illness or injury  Amount and/or Complexity of Data Reviewed Independent Historian:     Details: She is a cogent historian External Data Reviewed: notes.    Details: Review of the PDMP record indicates that on 05/30/2022 she received a prescription for oxycodone 10 mg #20.  She was also given a prescription for 12 oxycodone tablets on 07/22/2022.  Prior vital signs do not indicate bradycardia. Labs: ordered.    Details: CBC, metabolic panel, urinalysis -- normal except white count high Radiology: ordered and independent interpretation performed.    Details: Chest x-ray-no definite infiltrate, possible atelectasis right lower lobe, no edema ECG/medicine tests: ordered and independent interpretation performed.    Details: Orthostatic blood pressure and pulses, ordered and done at 1704 - supine blood pressure 93/45 with a heart rate of 66, sitting blood pressure 61/43 with a heart rate of 83, standing blood pressure 61/43 with heart rate of  78.  Cardiac monitor while supine, heart rates in the mid 30s persistently. Discussion of management or test interpretation with external provider(s): Case discussed with cardiologist, Dr.Narcisse--at [facility] in [place].  He advises to admit patient to hospitalist service here at [facility], for overnight observation on cardiac monitor.  Recommend cardiology consultation tomorrow.  No acute cardiology intervention is required at this time.  Risk Prescription drug management. Decision regarding hospitalization. Risk Details: Patient presenting for evaluation of dizziness.  She has bradycardia, new finding, with increased dizziness on standing.  She reports having leg weakness which is acute on chronic.  She has chronic pain and is currently out of her narcotic pain medicines.  She does not appear to have any significant disability or deformity of her legs.  No clear cause for bradycardia at this time.  Consider sick sinus syndrome.  She is not having chest pain.  Doubt pneumonia.  Nonspecific white blood count elevation.  Normal potassium  Critical Care Total time providing critical care: 40 minutes           Final Clinical Impression(s) / ED Diagnoses Final diagnoses:  Sinus bradycardia  Dizziness  Chronic pain syndrome    Rx / DC Orders ED Discharge Orders     None         Mancel Bale, MD 07/27/22 2155

## 2022-07-27 NOTE — Progress Notes (Signed)
   07/27/22 2336  Assess: MEWS Score  BP (!) 66/44  Pulse Rate (!) 44  Assess: MEWS Score  MEWS Temp 0  MEWS Systolic 3  MEWS Pulse 1  MEWS RR 1  MEWS LOC 0  MEWS Score 5  MEWS Score Color Red  Assess: if the MEWS score is Yellow or Red  Were vital signs taken at a resting state? Yes  Focused Assessment Change from prior assessment (see assessment flowsheet)  Take Vital Signs  Increase Vital Sign Frequency  Red: Q 1hr X 4 then Q 4hr X 4, if remains red, continue Q 4hrs  Escalate  MEWS: Escalate Red: discuss with charge nurse/RN and provider, consider discussing with RRT  Notify: Charge Nurse/RN  Name of Charge Nurse/RN Notified Linwood Dibbles, RN  Date Charge Nurse/RN Notified 07/27/22  Time Charge Nurse/RN Notified 2236  Notify: Provider  Provider Name/Title Dr. Carren Rang  Date Provider Notified 07/27/22  Time Provider Notified 2338  Assess: SIRS CRITERIA  SIRS Temperature  0  SIRS Pulse 0  SIRS Respirations  0  SIRS WBC 1  SIRS Score Sum  1

## 2022-07-28 ENCOUNTER — Inpatient Hospital Stay (HOSPITAL_COMMUNITY): Payer: Medicare HMO

## 2022-07-28 DIAGNOSIS — R571 Hypovolemic shock: Secondary | ICD-10-CM | POA: Diagnosis present

## 2022-07-28 DIAGNOSIS — G8929 Other chronic pain: Secondary | ICD-10-CM

## 2022-07-28 DIAGNOSIS — G4700 Insomnia, unspecified: Secondary | ICD-10-CM | POA: Diagnosis present

## 2022-07-28 DIAGNOSIS — R011 Cardiac murmur, unspecified: Secondary | ICD-10-CM | POA: Diagnosis not present

## 2022-07-28 DIAGNOSIS — Z91148 Patient's other noncompliance with medication regimen for other reason: Secondary | ICD-10-CM | POA: Diagnosis not present

## 2022-07-28 DIAGNOSIS — I959 Hypotension, unspecified: Secondary | ICD-10-CM

## 2022-07-28 DIAGNOSIS — Z880 Allergy status to penicillin: Secondary | ICD-10-CM | POA: Diagnosis not present

## 2022-07-28 DIAGNOSIS — E872 Acidosis, unspecified: Secondary | ICD-10-CM

## 2022-07-28 DIAGNOSIS — G894 Chronic pain syndrome: Secondary | ICD-10-CM | POA: Diagnosis present

## 2022-07-28 DIAGNOSIS — Z79899 Other long term (current) drug therapy: Secondary | ICD-10-CM | POA: Diagnosis not present

## 2022-07-28 DIAGNOSIS — R001 Bradycardia, unspecified: Secondary | ICD-10-CM

## 2022-07-28 DIAGNOSIS — L8990 Pressure ulcer of unspecified site, unspecified stage: Secondary | ICD-10-CM | POA: Insufficient documentation

## 2022-07-28 DIAGNOSIS — J9811 Atelectasis: Secondary | ICD-10-CM | POA: Diagnosis present

## 2022-07-28 DIAGNOSIS — N390 Urinary tract infection, site not specified: Secondary | ICD-10-CM | POA: Diagnosis present

## 2022-07-28 DIAGNOSIS — Z886 Allergy status to analgesic agent status: Secondary | ICD-10-CM | POA: Diagnosis not present

## 2022-07-28 DIAGNOSIS — I10 Essential (primary) hypertension: Secondary | ICD-10-CM | POA: Diagnosis present

## 2022-07-28 DIAGNOSIS — Y929 Unspecified place or not applicable: Secondary | ICD-10-CM | POA: Diagnosis not present

## 2022-07-28 DIAGNOSIS — R9431 Abnormal electrocardiogram [ECG] [EKG]: Secondary | ICD-10-CM | POA: Diagnosis present

## 2022-07-28 DIAGNOSIS — E860 Dehydration: Secondary | ICD-10-CM | POA: Diagnosis present

## 2022-07-28 DIAGNOSIS — E876 Hypokalemia: Secondary | ICD-10-CM

## 2022-07-28 DIAGNOSIS — F1910 Other psychoactive substance abuse, uncomplicated: Secondary | ICD-10-CM | POA: Diagnosis present

## 2022-07-28 DIAGNOSIS — F32A Depression, unspecified: Secondary | ICD-10-CM | POA: Diagnosis present

## 2022-07-28 DIAGNOSIS — Z888 Allergy status to other drugs, medicaments and biological substances status: Secondary | ICD-10-CM | POA: Diagnosis not present

## 2022-07-28 DIAGNOSIS — W19XXXA Unspecified fall, initial encounter: Secondary | ICD-10-CM | POA: Diagnosis present

## 2022-07-28 DIAGNOSIS — K529 Noninfective gastroenteritis and colitis, unspecified: Secondary | ICD-10-CM | POA: Diagnosis present

## 2022-07-28 DIAGNOSIS — R42 Dizziness and giddiness: Secondary | ICD-10-CM | POA: Diagnosis present

## 2022-07-28 DIAGNOSIS — F419 Anxiety disorder, unspecified: Secondary | ICD-10-CM | POA: Diagnosis present

## 2022-07-28 DIAGNOSIS — R319 Hematuria, unspecified: Secondary | ICD-10-CM | POA: Diagnosis present

## 2022-07-28 DIAGNOSIS — G40909 Epilepsy, unspecified, not intractable, without status epilepticus: Secondary | ICD-10-CM | POA: Diagnosis present

## 2022-07-28 LAB — ECHOCARDIOGRAM COMPLETE
AR max vel: 2.64 cm2
AV Area VTI: 2.45 cm2
AV Area mean vel: 2.55 cm2
AV Mean grad: 10.7 mmHg
AV Peak grad: 19.6 mmHg
Ao pk vel: 2.21 m/s
Area-P 1/2: 2.69 cm2
Height: 65 in
MV VTI: 3.35 cm2
S' Lateral: 2.3 cm
Weight: 1798.39 oz

## 2022-07-28 LAB — COMPREHENSIVE METABOLIC PANEL WITH GFR
ALT: 24 U/L (ref 0–44)
AST: 16 U/L (ref 15–41)
Albumin: 3 g/dL — ABNORMAL LOW (ref 3.5–5.0)
Alkaline Phosphatase: 140 U/L — ABNORMAL HIGH (ref 38–126)
Anion gap: 11 (ref 5–15)
BUN: 16 mg/dL (ref 8–23)
CO2: 20 mmol/L — ABNORMAL LOW (ref 22–32)
Calcium: 8.9 mg/dL (ref 8.9–10.3)
Chloride: 106 mmol/L (ref 98–111)
Creatinine, Ser: 1.05 mg/dL — ABNORMAL HIGH (ref 0.44–1.00)
GFR, Estimated: 58 mL/min — ABNORMAL LOW
Glucose, Bld: 105 mg/dL — ABNORMAL HIGH (ref 70–99)
Potassium: 3.3 mmol/L — ABNORMAL LOW (ref 3.5–5.1)
Sodium: 137 mmol/L (ref 135–145)
Total Bilirubin: 0.7 mg/dL (ref 0.3–1.2)
Total Protein: 6 g/dL — ABNORMAL LOW (ref 6.5–8.1)

## 2022-07-28 LAB — CBC WITH DIFFERENTIAL/PLATELET
Abs Immature Granulocytes: 0.11 10*3/uL — ABNORMAL HIGH (ref 0.00–0.07)
Basophils Absolute: 0.1 10*3/uL (ref 0.0–0.1)
Basophils Relative: 1 %
Eosinophils Absolute: 0.2 10*3/uL (ref 0.0–0.5)
Eosinophils Relative: 1 %
HCT: 38.4 % (ref 36.0–46.0)
Hemoglobin: 12.2 g/dL (ref 12.0–15.0)
Immature Granulocytes: 1 %
Lymphocytes Relative: 9 %
Lymphs Abs: 1.6 10*3/uL (ref 0.7–4.0)
MCH: 29.7 pg (ref 26.0–34.0)
MCHC: 31.8 g/dL (ref 30.0–36.0)
MCV: 93.4 fL (ref 80.0–100.0)
Monocytes Absolute: 1.1 10*3/uL — ABNORMAL HIGH (ref 0.1–1.0)
Monocytes Relative: 6 %
Neutro Abs: 14.7 10*3/uL — ABNORMAL HIGH (ref 1.7–7.7)
Neutrophils Relative %: 82 %
Platelets: 430 10*3/uL — ABNORMAL HIGH (ref 150–400)
RBC: 4.11 MIL/uL (ref 3.87–5.11)
RDW: 13.7 % (ref 11.5–15.5)
WBC: 17.8 10*3/uL — ABNORMAL HIGH (ref 4.0–10.5)
nRBC: 0 % (ref 0.0–0.2)

## 2022-07-28 LAB — CBC
HCT: 40.4 % (ref 36.0–46.0)
Hemoglobin: 13.2 g/dL (ref 12.0–15.0)
MCH: 29.9 pg (ref 26.0–34.0)
MCHC: 32.7 g/dL (ref 30.0–36.0)
MCV: 91.6 fL (ref 80.0–100.0)
Platelets: 531 10*3/uL — ABNORMAL HIGH (ref 150–400)
RBC: 4.41 MIL/uL (ref 3.87–5.11)
RDW: 13.9 % (ref 11.5–15.5)
WBC: 20.3 10*3/uL — ABNORMAL HIGH (ref 4.0–10.5)
nRBC: 0 % (ref 0.0–0.2)

## 2022-07-28 LAB — LACTIC ACID, PLASMA
Lactic Acid, Venous: 3.9 mmol/L (ref 0.5–1.9)
Lactic Acid, Venous: 1.8 mmol/L (ref 0.5–1.9)

## 2022-07-28 LAB — BASIC METABOLIC PANEL
Anion gap: 6 (ref 5–15)
BUN: 13 mg/dL (ref 8–23)
CO2: 20 mmol/L — ABNORMAL LOW (ref 22–32)
Calcium: 8.6 mg/dL — ABNORMAL LOW (ref 8.9–10.3)
Chloride: 109 mmol/L (ref 98–111)
Creatinine, Ser: 0.91 mg/dL (ref 0.44–1.00)
GFR, Estimated: >60 mL/min (ref >60)
Glucose, Bld: 132 mg/dL — ABNORMAL HIGH (ref 70–99)
Potassium: 3.7 mmol/L (ref 3.5–5.1)
Sodium: 135 mmol/L (ref 135–145)

## 2022-07-28 LAB — TROPONIN I (HIGH SENSITIVITY): Troponin I (High Sensitivity): 3 ng/L

## 2022-07-28 LAB — MAGNESIUM: Magnesium: 2 mg/dL (ref 1.7–2.4)

## 2022-07-28 LAB — OCCULT BLOOD X 1 CARD TO LAB, STOOL: Fecal Occult Bld: POSITIVE — AB

## 2022-07-28 LAB — MRSA NEXT GEN BY PCR, NASAL: MRSA by PCR Next Gen: NOT DETECTED

## 2022-07-28 LAB — TSH: TSH: 5.434 u[IU]/mL — ABNORMAL HIGH (ref 0.350–4.500)

## 2022-07-28 MED ORDER — ATROPINE SULFATE 1 MG/ML IV SOLN
INTRAVENOUS | Status: AC
  Administered 2022-07-28: 1 mg
  Filled 2022-07-28: qty 1

## 2022-07-28 MED ORDER — SODIUM CHLORIDE 0.9 % IV SOLN: 250.0000 mL | INTRAVENOUS | Status: DC

## 2022-07-28 MED ORDER — POTASSIUM CHLORIDE 10 MEQ/100ML IV SOLN
10.0000 meq | INTRAVENOUS | Status: AC
  Administered 2022-07-28 (×4): 10 meq via INTRAVENOUS
  Filled 2022-07-28 (×4): qty 100

## 2022-07-28 MED ORDER — BLISTEX MEDICATED EX OINT
TOPICAL_OINTMENT | CUTANEOUS | Status: DC | PRN
  Filled 2022-07-28: qty 6.3

## 2022-07-28 MED ORDER — MIDODRINE HCL 5 MG PO TABS
5.0000 mg | ORAL_TABLET | Freq: Three times a day (TID) | ORAL | Status: DC
  Administered 2022-07-28: 5 mg via ORAL

## 2022-07-28 MED ORDER — SODIUM CHLORIDE 0.9 % IV SOLN: INTRAVENOUS | Status: DC

## 2022-07-28 MED ORDER — ATROPINE SULFATE 1 MG/10ML IJ SOSY: 1.0000 mg | PREFILLED_SYRINGE | Freq: Once | INTRAMUSCULAR | Status: DC

## 2022-07-28 MED ORDER — NOREPINEPHRINE 4 MG/250ML-% IV SOLN
INTRAVENOUS | Status: AC
  Filled 2022-07-28: qty 250

## 2022-07-28 MED ORDER — MIDODRINE HCL 5 MG PO TABS
5.0000 mg | ORAL_TABLET | Freq: Three times a day (TID) | ORAL | Status: DC
  Filled 2022-07-28: qty 1

## 2022-07-28 MED ORDER — CHLORHEXIDINE GLUCONATE CLOTH 2 % EX PADS
6.0000 | MEDICATED_PAD | Freq: Every day | CUTANEOUS | Status: DC
  Administered 2022-07-28 – 2022-07-30 (×3): 6 via TOPICAL

## 2022-07-28 MED ORDER — LOPERAMIDE HCL 2 MG PO CAPS: 2.0000 mg | ORAL_CAPSULE | ORAL | Status: DC | PRN

## 2022-07-28 MED ORDER — NOREPINEPHRINE 4 MG/250ML-% IV SOLN: 0.0000 ug/min | INTRAVENOUS | Status: DC

## 2022-07-28 MED ORDER — NOREPINEPHRINE 4 MG/250ML-% IV SOLN
2.0000 ug/min | INTRAVENOUS | Status: DC
  Administered 2022-07-28: 2 ug/min via INTRAVENOUS

## 2022-07-28 MED ORDER — DICYCLOMINE HCL 10 MG PO CAPS
10.0000 mg | ORAL_CAPSULE | Freq: Once | ORAL | Status: AC
  Administered 2022-07-28: 10 mg via ORAL
  Filled 2022-07-28: qty 1

## 2022-07-28 NOTE — Progress Notes (Signed)
Patient arrived to the floor around 2300. She has been incontinent of stool and urine from the ER to the floor. She stated that she would continue to be incontinent until she got pain medication for her stomach issues and stated that is how she takes care of it at home. When the tech and I finally got her cleaned up we got vital signs on her and her systolic blood pressure was 77 with the machine. Manual blood pressure was taken and the systolic was 66. Her radial pulse was hard to find.. She was a MEWS 3 than a MEWS 5. Charge nurse and DR was notified. New ordered were received for Midodrine and bolus. MD to floor to assess patient and before we could get bolus started her systolic blood pressure was down to 44. Atropine  1mg  was ordered. We placed the Zoll pads on the patient to monitor patient. Heart rate after atropine push increased to 80 briefly but then came back down to the 60s. She was then ordered to transfer to ICU. Report was given and patient was transferred to ICU. Patient has continually complained of headache, stomach ache, back ache, knee pain, chest pain. She continues to constantly ask for pain medication even though she has been told several times that right now it was not safe for her to have pain medication.

## 2022-07-28 NOTE — Assessment & Plan Note (Addendum)
Leukocytosis as high as 20,000, urine white blood cells 21-50 -Rocephin started empirically. -No other signs of source of infection appreciated -Patient denies dysuria and is currently afebrile. -Sepsis was rule out.

## 2022-07-28 NOTE — Assessment & Plan Note (Addendum)
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 -Continue to maintain adequate hydration and will use Lomotil as needed.

## 2022-07-28 NOTE — TOC Initial Note (Signed)
Transition of Care [facility]) - Initial/Assessment Note    Patient Details  Name: Jamie [name] MRN: [id number] Date of Birth: 02/16/1956  Transition of Care [facility]) CM/SW Contact:    Jamie Bleak, RN Phone Number: 07/28/2022, 2:00 PM  Clinical Narrative:  Patient admitted with sinus bradycardia. Patient has a high risk for readmission. CM spoke with her daughter, Jamie [name]. Patient was discharged from [facility] a few days ago. They set up home health with [facility]. They have not made the first visit. Patient lives alone, she  has an Aide Monday and Friday. Starting next week they will add Wednesday. Aide assist with ADL's, cooking, and taking patient to appointments.  TOC to follow for [facility] orders for [facility].               Expected Discharge Plan: Home w Home Health Services Barriers to Discharge: Continued Medical Work up  Patient Goals and CMS Choice Patient states their goals for this hospitalization and ongoing recovery are:: to go home. CMS Medicare.gov Compare Post Acute Care list provided to:: Patient Represenative (must comment) Choice offered to / list presented to : Adult Children  Expected Discharge Plan and Services Expected Discharge Plan: Home w Home Health Services    Post Acute Care Choice: Home Health Living arrangements for the past 2 months: Single Family Home                    Prior Living Arrangements/Services Living arrangements for the past 2 months: Single Family Home Lives with:: Self Patient language and need for interpreter reviewed:: Yes        Need for Family Participation in Patient Care: Yes (Comment)   Current home services: DME Criminal Activity/Legal Involvement Pertinent to Current Situation/Hospitalization: No - Comment as needed  Activities of Daily Living     Permission Sought/Granted      Share Information with NAME: Jamie [name]     Permission granted to share info w Relationship: Daughter     Emotional Assessment      Orientation: :  Oriented to Self, Oriented to Place, Oriented to Situation, Oriented to  Time Alcohol / Substance Use: Not Applicable Psych Involvement: No (comment)  Admission diagnosis:  Dizziness [R42] Chronic pain syndrome [G89.4] Sinus bradycardia [R00.1] Patient Active Problem List   Diagnosis Date Noted   Chronic pain 07/28/2022   UTI (urinary tract infection) 07/28/2022   Chronic diarrhea 07/28/2022   Lactic acidosis 07/28/2022   Pressure injury of skin 07/28/2022   Sinus bradycardia 07/27/2022   Seizure (HCC) 07/18/2022   Generalized weakness 05/04/2022   Hypoglycemia 05/04/2022   Hypokalemia 04/06/2022   Falls frequently 04/06/2022   Alcohol abuse 04/06/2022   Opiate dependence --Chronic pain 04/06/2022   Benzodiazepine dependence /Chronic anxiety 04/06/2022   Abdominal pain    Nausea vomiting and diarrhea 05/08/2016   Sepsis, unspecified organism (HCC) 05/08/2016   Migraine 05/08/2016   AKI (acute kidney injury) (HCC) 05/08/2016   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 [place] [address] Phone: [phone] Fax: [phone]   Readmission Risk Interventions    07/28/2022    1:55 PM  Readmission Risk Prevention Plan  Transportation Screening Complete  Medication Review (RN Care Manager) Complete  PCP or Specialist appointment within 3-5 days of discharge Not Complete  HRI or Home Care Consult Complete  SW Recovery Care/Counseling Consult Complete  Palliative Care Screening Not Applicable  Skilled Nursing Facility Not Applicable

## 2022-07-28 NOTE — Assessment & Plan Note (Addendum)
-  Lactic acid 3.9 at time of admission. -Likely secondary to persistent hypotension and GI losses/dehydration. -She has been advised to maintain adequate hydration.

## 2022-07-28 NOTE — H&P (Signed)
History and Physical    Patient: Jamie [name] DVV:[id number] DOB: 05-07-1956 DOA: 07/27/2022 DOS: the patient was seen and examined on 07/28/2022 PCP: Christain Sacramento, MD  Patient coming from: Home  Chief Complaint:  Chief Complaint  Patient presents with   Generalized Body Aches   HPI: Jamie [name] is a 66 y.o. female with medical history significant of anxiety, chronic pain, gastroenteritis, depression, hypertension, presents the ED with a chief complaint of fall.  Patient reports she has had generalized weakness, and she had a fall.  She cannot describe how she fell.  She does not know if it is a trip and fall, or she had dizziness or chest pain beforehand.  She is a poor historian, but also at the time of my exam she is being treated acutely for bradycardia and hypotension.  For this reason questions were limited.  She does report that she has chronic pain all over her body.  She continues to ask for oxycodone.  When advised that her blood pressure is too low, and any opiate could cause it to drop to levels that are not compatible with life, she asks if I can just do a low-dose opiate.  She then goes on to explain that when she is in a lot of pain her blood pressure drops.  She reports that she is having crampy abdominal pain that is normal for her IBS.  She can remember when she was diagnosed for IBS.  She reports the only medication she takes for is oxycodone which helps to slow down her diarrhea.  Patient denies any blood in her stool, or urine.  At the time of my exam patient is being transferred to the ICU after being given atropine, also further history is not obtained at this time. Review of Systems: As mentioned in the history of present illness. All other systems reviewed and are negative. Past Medical History:  Diagnosis Date   Anxiety    Arthritis    Chronic pain    Gastroenteritis    Headache    Hypotension    Insomnia    Suicidal ideation    Past Surgical History:  Procedure  Laterality Date   BACK SURGERY     CARPAL TUNNEL RELEASE     LAPAROSCOPIC OOPHERECTOMY     MOUTH SURGERY     Social History:  reports that she has never smoked. She has never used smokeless tobacco. She reports that she does not currently use alcohol. She reports that she does not use drugs.  Allergies  Allergen Reactions   Amoxicillin-Pot Clavulanate Nausea And Vomiting    Can tolerate plain Amoxicillin   Azithromycin Nausea And Vomiting    Other reaction(s): Other (See Comments) unknown   Bupropion Nausea And Vomiting   Nsaids Other (See Comments)    Stomach problems   Prednisone Diarrhea   Vilazodone Diarrhea    Family History  Problem Relation Age of Onset   Colon cancer Neg Hx     Prior to Admission medications   Medication Sig Start Date End Date Taking? Authorizing Provider  ALPRAZolam Duanne Moron) 1 MG tablet Take 1 tablet (1 mg total) by mouth 3 (three) times daily as needed for anxiety or sleep. 05/17/22  Yes Daleen Bo, MD  butalbital-acetaminophen-caffeine (FIORICET) [phone] MG tablet Take 1-2 tablets by mouth 2 (two) times daily as needed for headache. 05/17/22  Yes Daleen Bo, MD  diphenhydrAMINE HCl, Sleep, (ZZZQUIL) 50 MG/30ML LIQD Take by mouth.   Yes [provider]  loperamide (IMODIUM) 2 MG capsule Take 2 mg by mouth every 6 (six) hours as needed. 05/09/22  Yes [provider]  OVER THE COUNTER MEDICATION calcium   Yes [provider]  tiZANidine (ZANAFLEX) 4 MG tablet Take 4 mg by mouth 3 (three) times daily. 06/06/22  Yes [provider]  traZODone (DESYREL) 100 MG tablet Take 100 mg by mouth at bedtime. 03/17/22  Yes [provider]  pantoprazole (PROTONIX) 40 MG tablet Take 40 mg by mouth daily. Patient not taking: Reported on 07/27/2022 06/21/22   [provider]  potassium chloride (MICRO-K) 10 MEQ CR capsule Take 10 mEq by mouth 2 (two) times daily. Patient not taking: Reported on 07/27/2022 07/17/22    [provider]    Physical Exam: Vitals:   07/28/22 0021 07/28/22 0200 07/28/22 0215 07/28/22 0230  BP: (!) 60/34 (!) 145/64 (!) 85/41 (!) 155/63  Pulse:   (!) 58 (!) 51  Resp:  $Remo'10 13 14  'VDzwI$ Temp:      TempSrc:      SpO2:   100% 100%  Weight:      Height:       1.  General: Patient lying supine in bed,  no acute distress   2. Psychiatric: Alert and oriented x 3, mood is normal and behavior pain medication seeking, pleasant and cooperative with exam   3. Neurologic: Speech and language are normal, face is symmetric, moves all 4 extremities voluntarily, at baseline without acute deficits on limited exam   4. HEENMT:  Head is atraumatic, normocephalic, pupils reactive to light, neck is supple, trachea is midline, mucous membranes are moist   5. Respiratory : Lungs are clear to auscultation bilaterally without wheezing, rhonchi, rales, no cyanosis, no increase in work of breathing or accessory muscle use   6. Cardiovascular : Heart rate bradycardic, rhythm is regular, murmur present, rubs or gallops, no peripheral edema, carotid pulses palpated, but radial pulses could not be palpated given the hypotension  7. Gastrointestinal:  Abdomen is soft, nondistended, diffusely tender without guarding or grimace, bowel sounds active, no masses or organomegaly palpated   8. Skin:  Skin is warm, dry and intact without rashes, acute lesions, or ulcers on limited exam   9.Musculoskeletal:  No acute deformities or trauma, no asymmetry in tone, no peripheral edema, peripheral pulses palpated, no tenderness to palpation in the extremities  Data Reviewed: In the ED Temp 7.7, heart rate 36-38, respiratory rate 9-16, blood pressure 108/59-136/81, satting at 93% Leukocytosis 14.8 Thrombocytosis 526 Hyponatremia 133, decreased bicarb at 21 UA with 21-50 white blood cells Chest x-ray shows right basilar opacity favored to represent atelectasis EKG showed a heart rate of 39, sinus  bradycardia Cardiology consulted and recommended obs on telemetry  Assessment and Plan: * Sinus bradycardia - Heart rate consistently low, as low as the 30s in the ER -Heart rate in the 40s when blood pressure dropped to systolic blood pressure 35T -1 mg atropine given which recovered the heart rate, but not the blood pressure -Continue Levophed for hypotension -Monitor in ICU -Consult cardiology  Lactic acidosis - Lactic acid 3.9 -Likely secondary to persistent hypotension -Continue IV fluids, continue pressors -Trend lactic acid  Chronic diarrhea - Patient reports this is her normal IBS diarrhea -She is requesting opiates for it as she reports that is what she takes at home -Stool PCR ordered -Loperamide ordered -FOBT for completeness ordered -We will continue IV hydration  UTI (urinary tract infection) Leukocytosis as  high as 17.8, urine white blood cells 21-50 -Rocephin started -Chest x-ray shows right basilar opacity favored to represent atelectasis -After admission when patient blood pressure dropped patient technically met SIRS criteria, but this lactic acidosis and drop in blood pressure thought to be related to the sinus bradycardia, not to the infection -Continue to monitor  Chronic pain - Opiates are being held secondary to hypotension -Patient's main complaint is abdominal pain secondary to IBS at the time of admission, Bentyl ordered   Hypokalemia - Secondary to GI losses and poor p.o. intake -Replace and recheck in the a.m.      Advance Care Planning:   Code Status: Full Code   Consults: Cardiology  Family Communication: No family at bedside  Severity of Illness: The appropriate patient status for this patient is OBSERVATION. Observation status is judged to be reasonable and necessary in order to provide the required intensity of service to ensure the patient's safety. The patient's presenting symptoms, physical exam findings, and initial radiographic  and laboratory data in the context of their medical condition is felt to place them at decreased risk for further clinical deterioration. Furthermore, it is anticipated that the patient will be medically stable for discharge from the hospital within 2 midnights of admission.   Author: Rolla Plate, DO 07/28/2022 4:27 AM  For on call review [email or web address].

## 2022-07-28 NOTE — Consult Note (Addendum)
Cardiology Consultation:   Patient ID: Jamie [name] MRN: [id number]; DOB: Dec 18, 1956  Admit date: 07/27/2022 Date of Consult: 07/28/2022  PCP:  Barbie Banner, MD   [facility] Providers Cardiologist: New to [facility]   Patient Profile:   Jamie [name] is a 65 y.o. female with a hx of anxiety, gastroenteritis, polysubstance abuse, chronic pain syndrome, seizure disorder and recent admission for urosepsis who is being seen 07/28/2022 for the evaluation of bradycardia at the request of Dr. Carren Rang.  History of Present Illness:   Jamie [name] was recently admitted to [facility] from 7/24 - 07/22/2022 for generalized tonic-clonic seizure and was felt to be a provoked seizure and did not require continuation of Keppra per Neurology recommendations. Was also found to have Urosepsis during admission and treated with antibiotic therapy.   She presented back to [facility] ED on 07/27/2022 for evaluation of generalized pain after having been without her pain medications. In talking with the patient today, she reports she has been hurting from "head to toe" for the past week as she has been without her usual pain medications (Oxycodone). Also takes Fioricet and Xanax at home. When asked about chest pain, says she hurts all over. No recent palpitations or dyspnea. Says she did feel dizzy yesterday and but that has happened at home as well. No syncopal episodes. Does report more frequent diarrhea starting yesterday.   Initial labs showed WBC 14.8, Hgb 14.1, platelets 526, Na+ 133, K+ 3.6 and creatinine 0.90. Mg 2.2. TSH 1.048. Initial and repeat Hs Troponin negative at 4 and 3. Lactic Acid elevated at 3.9 with repeat of 1.8. CXR showed right basilar airspace opacity favored to represent atelectasis. EKG showed sinus bradycardia, HR 42 with computer generated QT of 605 ms. Calculated corrected QT at 502 ms.   She received Atropine around midnight and HR quickly improved. She has been followed on telemetry with HR in  the 60's to 90's. Was found to be hypotensive initially and was started on Levophed which is currently at 3 mcg/min and also on Midodrine  TID. Was also started on Rocephin by the admitting team given concerns for a recurrent UTI.   BP is now at 121/52 and repeat EKG this AM shows NSR, HR 69 with calculated QT at 466 ms.   Past Medical History:  Diagnosis Date   Anxiety    Arthritis    Chronic pain    Gastroenteritis    Headache    Hypotension    Insomnia    Suicidal ideation     Past Surgical History:  Procedure Laterality Date   BACK SURGERY     CARPAL TUNNEL RELEASE     LAPAROSCOPIC OOPHERECTOMY     MOUTH SURGERY       Home Medications:  Prior to Admission medications   Medication Sig Start Date End Date Taking? Authorizing Provider  ALPRAZolam Prudy Feeler) 1 MG tablet Take 1 tablet (1 mg total) by mouth 3 (three) times daily as needed for anxiety or sleep. 05/17/22  Yes Mancel Bale, MD  butalbital-acetaminophen-caffeine (FIORICET) [phone] MG tablet Take 1-2 tablets by mouth 2 (two) times daily as needed for headache. 05/17/22  Yes Mancel Bale, MD  diphenhydrAMINE HCl, Sleep, (ZZZQUIL) 50 MG/30ML LIQD Take by mouth.   Yes [provider]  loperamide (IMODIUM) 2 MG capsule Take 2 mg by mouth every 6 (six) hours as needed. 05/09/22  Yes [provider]  OVER THE COUNTER MEDICATION calcium   Yes [provider]  tiZANidine (ZANAFLEX) 4 MG tablet Take 4 mg by mouth 3 (three) times daily. 06/06/22  Yes [provider]  traZODone (DESYREL) 100 MG tablet Take 100 mg by mouth at bedtime. 03/17/22  Yes [provider]  pantoprazole (PROTONIX) 40 MG tablet Take 40 mg by mouth daily. Patient not taking: Reported on 07/27/2022 06/21/22   [provider]  potassium chloride (MICRO-K) 10 MEQ CR capsule Take 10 mEq by mouth 2 (two) times daily. Patient not taking: Reported on 07/27/2022 07/17/22   [provider]    Inpatient  Medications: Scheduled Meds:  atropine  1 mg Intravenous Once   Chlorhexidine Gluconate Cloth  6 each Topical Daily   heparin  5,000 Units Subcutaneous Q8H   midodrine  10 mg Oral TID WC   traZODone  100 mg Oral QHS   Continuous Infusions:  sodium chloride 125 mL/hr at 07/28/22 0543   sodium chloride     cefTRIAXone (ROCEPHIN)  IV Stopped (07/28/22 0136)   norepinephrine (LEVOPHED) Adult infusion 3 mcg/min (07/28/22 0756)   potassium chloride 10 mEq (07/28/22 0806)   sodium chloride     PRN Meds: acetaminophen **OR** acetaminophen, ALPRAZolam, loperamide, morphine injection, ondansetron **OR** ondansetron (ZOFRAN) IV, oxyCODONE  Allergies:    Allergies  Allergen Reactions   Amoxicillin-Pot Clavulanate Nausea And Vomiting    Can tolerate plain Amoxicillin   Azithromycin Nausea And Vomiting    Other reaction(s): Other (See Comments) unknown   Bupropion Nausea And Vomiting   Nsaids Other (See Comments)    Stomach problems   Prednisone Diarrhea   Vilazodone Diarrhea    Social History:   Social History   Socioeconomic History   Marital status: Single    Spouse name: Not on file   Number of children: Not on file   Years of education: Not on file   Highest education level: Not on file  Occupational History   Not on file  Tobacco Use   Smoking status: Never   Smokeless tobacco: Never  Vaping Use   Vaping Use: Never used  Substance and Sexual Activity   Alcohol use: Not Currently   Drug use: Never   Sexual activity: Not on file  Other Topics Concern   Not on file  Social History Narrative   Not on file   Social Determinants of Health   Financial Resource Strain: Not on file  Food Insecurity: Not on file  Transportation Needs: Not on file  Physical Activity: Not on file  Stress: Not on file  Social Connections: Not on file  Intimate Partner Violence: Not on file    Family History:    Family History  Problem Relation Age of Onset   Colon cancer Neg Hx       ROS:  Please see the history of present illness.   All other ROS reviewed and negative.     Physical Exam/Data:   Vitals:   07/28/22 0615 07/28/22 0630 07/28/22 0645 07/28/22 0736  BP: (!) 120/46 (!) 120/45 (!) 121/52   Pulse: 70 70 69 80  Resp: 10 (!) 9 14 (!) 22  Temp:    98.4 F (36.9 C)  TempSrc:    Oral  SpO2: 100% 99% 100% 100%  Weight:      Height:        Intake/Output Summary (Last 24 hours) at 07/28/2022 0904 Last data filed at 07/28/2022 0455 Gross per 24 hour  Intake 1529.54 ml  Output --  Net 1529.54 ml  07/27/2022    2:50 PM 07/18/2022    2:06 PM 05/27/2022    7:04 PM  Last 3 Weights  Weight (lbs) 112 lb 6.4 oz 112 lb 6.4 oz 121 lb 4.1 oz  Weight (kg) 50.984 kg 50.984 kg 55 kg     Body mass index is 18.7 kg/m.  General: Elderly, thin female appearing in no acute distress.  HEENT: normal Neck: no JVD Vascular: No carotid bruits; Distal pulses 2+ bilaterally Cardiac:  normal S1, S2; RRR; no murmur Lungs:  clear to auscultation bilaterally, no wheezing, rhonchi or rales  Abd: soft, nontender, no hepatomegaly  Ext: no pitting edema Musculoskeletal:  No deformities, BUE and BLE strength normal and equal Skin: warm and dry  Neuro:  CNs 2-12 intact, no focal abnormalities noted Psych:  Normal affect   EKG:  The EKG was personally reviewed and demonstrates: sinus bradycardia, HR 42 with computer generated QT of 605 ms. Calculated corrected QT at 502 ms.   Telemetry:  Telemetry was personally reviewed and demonstrates: NSR, HR in 60's to 90's.   Relevant CV Studies:  Echocardiogram: Pending  Laboratory Data:  High Sensitivity Troponin:   Recent Labs  Lab 07/27/22 1714 07/28/22 0042  TROPONINIHS 4 3     Chemistry Recent Labs  Lab 07/22/22 0458 07/27/22 1714 07/27/22 1925 07/28/22 0042  NA 136 133*  --  137  K 3.5 3.6  --  3.3*  CL 103 100  --  106  CO2 27 21*  --  20*  GLUCOSE 92 111*  --  105*  BUN 8 14  --  16  CREATININE 0.74 0.90   --  1.05*  CALCIUM 8.8* 9.7  --  8.9  MG  --   --  2.2 2.0  GFRNONAA >60 >60  --  58*  ANIONGAP 6 12  --  11    Recent Labs  Lab 07/22/22 0458 07/27/22 1714 07/28/22 0042  PROT 4.9* 7.1 6.0*  ALBUMIN 2.3* 3.6 3.0*  AST 160* 16 16  ALT 112* 27 24  ALKPHOS 159* 170* 140*  BILITOT 0.4 0.8 0.7   Lipids No results for input(s): "CHOL", "TRIG", "HDL", "LABVLDL", "LDLCALC", "CHOLHDL" in the last 168 hours.  Hematology Recent Labs  Lab 07/27/22 1714 07/28/22 0042 07/28/22 0609  WBC 14.8* 17.8* 20.3*  RBC 4.85 4.11 4.41  HGB 14.1 12.2 13.2  HCT 43.6 38.4 40.4  MCV 89.9 93.4 91.6  MCH 29.1 29.7 29.9  MCHC 32.3 31.8 32.7  RDW 13.6 13.7 13.9  PLT 526* 430* 531*   Thyroid  Recent Labs  Lab 07/28/22 0042  TSH 5.434*    BNPNo results for input(s): "BNP", "PROBNP" in the last 168 hours.  DDimer No results for input(s): "DDIMER" in the last 168 hours.   Radiology/Studies:  DG Chest 2 View  Result Date: 07/27/2022 CLINICAL DATA:  Dizzy and generalized pain EXAM: CHEST - 2 VIEW COMPARISON:  Radiographs 07/18/2022 FINDINGS: Stable cardiomediastinal silhouette. Right-greater-than-left basilar atelectasis. No pleural effusion or pneumothorax. IMPRESSION: Right basilar airspace opacity favored to represent atelectasis. Electronically Signed   By: Minerva Fester M.D.   On: 07/27/2022 19:36     Assessment and Plan:   1. Sinus Bradycardia - She was bradycardiac and hypotensive on admission but was in sinus rhythm with this and no evidence of high-grade heart block. She did receive 1 dose of Atropine but has not required additional doses and HR has been in the 60's to 90's. Suspect this was possibly  due to her chronic pain and anxiety medications and by review of prior notes, there has been concern for polysubstance abuse. - Would continue to follow on telemetry and avoid AV nodal blocking agents. No additional recommendations at this time.   2. Prolonged QT - EKG on admission showed  sinus bradycardia, HR 42 with computer generated QT of 605 ms and calculated corrected QT at 502 ms. Improved this AM with computer generated QT at 466 ms. Her pain medications are currently held given hypotension. Would discontinue Trazodone as she reports she was not taking this at home.   3. Hypotension - Reports her SBP is typically in the 90's. She has been started on Midodrine by the admitting team and also on Levophed. Wean Levophed as BP allows.   4. Elevated Lactic Acid/Possible UTI - Lactic Acid was initially elevated at 3.9 with repeat of 1.8. She was started on Rocephin given concerns for a recurrent UTI. Management per the admitting team.   For questions or updates, please contact [facility] Please consult [email or web address] for contact info under    Signed, Ellsworth Lennox, PA-C  07/28/2022 9:04 AM   Patient seen and examined and agree with Randall An, PA-C as detailed above.  In brief, the patient is a  66 y.o. female with a hx of anxiety, gastroenteritis, polysubstance abuse, chronic pain syndrome, seizure disorder and recent admission for urosepsis who presented to [facility] with generalized pain after being out of her pain medications found to be hypotensive and bradycardic on admission. Cardiology was consulted for bradycardia.  HR initially in the ER 40s, but went down to the 30s. ECG with NSR, prolonged QT (~500) without ischemic changes. Labs notable for trop negative x2. Lactate 3.9>1.8, WBC 17.8>20.3. Cr 1.05. She was given atropine in the ER and HR have improved back to 70-90s this morning.   Overall, suspect her bradycardia was likely medication induced. She has no evidence of high degree AVB and HR recovered with single dose of atropine. Qtc has also improved to this AM. No signs of ACS. Will continue conservative management at this time.   GEN: No acute distress.   Neck: No JVD Cardiac: RRR, no murmurs, rubs, or gallops.  Respiratory: Clear to  auscultation bilaterally. GI: Soft, nontender, non-distended  MS: No edema; No deformity. Neuro:  Nonfocal  Psych: Normal affect    Plan: -Bradycardia has resolved with single dose of atropine; suspect symptoms were medication induced/substance use  -No evidence of ACS or high degree AVB -Management of suspected UTI/chronic pain per primary -No further cardiac work-up as inpatient  Cardiology will sign-off. Please feel free to call with questions or concerns.  Laurance Flatten, MD

## 2022-07-28 NOTE — Progress Notes (Signed)
Patient seen and examined; admitted after midnight secondary to generalized body aches, bradycardia and hypotension.  Patient responded to the use of atropine and subsequently given also decrease in her blood pressure and requiring initiation of midodrine and Levophed; patient admitted to the ICU, fluid resuscitation for ongoing lactic acidosis in the setting of hypotension provided.  At time of my evaluation feeling better and reporting improvement in her symptoms.  Please refer to H&P written by Dr. Carren Rang for further info/details on admission.  Plan: -Adjust midodrine dose and discontinue Levophed in the setting of a stable vital signs currently. -Continue fluid resuscitation -Continue empirical management with Rocephin for UTI while following culture results. -Patient with positive fecal occult blood test but is stable hemoglobin; stopping heparin products for DVT prophylaxis and using SCDs. -Follow hemoglobin trend -Continue PPI. -follow clinical response.  Vassie Loll MD [phone]

## 2022-07-28 NOTE — Assessment & Plan Note (Addendum)
-  Heart rate consistently low, as low as the 30s in the ER -No medications listed on her records that could cause AV node blockage -2D echo without wall motion abnormalities demonstrating just grade 1 diastolic dysfunction.  Ejection fraction more than 75%. -Excellent response to the use of atropine -Continue telemetry monitoring -Follow electrolytes and replete as needed -Continue to follow heart rate and maintain adequate hydration.

## 2022-07-28 NOTE — Progress Notes (Signed)
*  PRELIMINARY RESULTS* Echocardiogram 2D Echocardiogram has been performed.  Jamie [name] 07/28/2022, 2:15 PM

## 2022-07-28 NOTE — Assessment & Plan Note (Addendum)
-  Secondary to GI losses and poor p.o. intake -repleted -Continue to follow trend and further replete as needed -Potassium 3.5 at time of discharge.

## 2022-07-28 NOTE — Assessment & Plan Note (Addendum)
-  Opiates were held secondary to hypotension -Concerns of misuse analgesics as an outpatient -Continue the use of Tylenol and follow clinical response. -Patient also having some intermittent abdominal cramps from her IBS, Bentyl was use as needed while inpatient.

## 2022-07-29 DIAGNOSIS — E876 Hypokalemia: Secondary | ICD-10-CM | POA: Diagnosis not present

## 2022-07-29 DIAGNOSIS — K529 Noninfective gastroenteritis and colitis, unspecified: Secondary | ICD-10-CM | POA: Diagnosis not present

## 2022-07-29 DIAGNOSIS — R5381 Other malaise: Secondary | ICD-10-CM

## 2022-07-29 DIAGNOSIS — G894 Chronic pain syndrome: Secondary | ICD-10-CM | POA: Diagnosis not present

## 2022-07-29 DIAGNOSIS — R001 Bradycardia, unspecified: Secondary | ICD-10-CM | POA: Diagnosis not present

## 2022-07-29 DIAGNOSIS — R571 Hypovolemic shock: Secondary | ICD-10-CM

## 2022-07-29 DIAGNOSIS — R579 Shock, unspecified: Secondary | ICD-10-CM | POA: Diagnosis present

## 2022-07-29 LAB — GASTROINTESTINAL PANEL BY PCR, STOOL (REPLACES STOOL CULTURE)

## 2022-07-29 LAB — CBC
HCT: 35.6 % — ABNORMAL LOW (ref 36.0–46.0)
Hemoglobin: 11.4 g/dL — ABNORMAL LOW (ref 12.0–15.0)
MCH: 29.4 pg (ref 26.0–34.0)
MCHC: 32 g/dL (ref 30.0–36.0)
MCV: 91.8 fL (ref 80.0–100.0)
Platelets: 339 10*3/uL (ref 150–400)
RBC: 3.88 MIL/uL (ref 3.87–5.11)
RDW: 14 % (ref 11.5–15.5)
WBC: 11.3 10*3/uL — ABNORMAL HIGH (ref 4.0–10.5)
nRBC: 0 % (ref 0.0–0.2)

## 2022-07-29 LAB — BASIC METABOLIC PANEL
Anion gap: 5 (ref 5–15)
BUN: 10 mg/dL (ref 8–23)
CO2: 20 mmol/L — ABNORMAL LOW (ref 22–32)
Calcium: 8.5 mg/dL — ABNORMAL LOW (ref 8.9–10.3)
Chloride: 112 mmol/L — ABNORMAL HIGH (ref 98–111)
Creatinine, Ser: 0.8 mg/dL (ref 0.44–1.00)
GFR, Estimated: >60 mL/min (ref >60)
Glucose, Bld: 90 mg/dL (ref 70–99)
Potassium: 3.5 mmol/L (ref 3.5–5.1)
Sodium: 137 mmol/L (ref 135–145)

## 2022-07-29 LAB — URINE CULTURE: Culture: 30000 — AB

## 2022-07-29 LAB — PROCALCITONIN: Procalcitonin: <0.1 ng/mL

## 2022-07-29 MED ORDER — MORPHINE SULFATE (PF) 2 MG/ML IV SOLN
2.0000 mg | INTRAVENOUS | Status: DC | PRN
  Administered 2022-07-29 (×2): 2 mg via INTRAVENOUS
  Filled 2022-07-29 (×2): qty 1

## 2022-07-29 MED ORDER — LORATADINE 10 MG PO TABS
10.0000 mg | ORAL_TABLET | Freq: Every day | ORAL | Status: DC
  Administered 2022-07-29 – 2022-07-30 (×2): 10 mg via ORAL
  Filled 2022-07-29 (×2): qty 1

## 2022-07-29 MED ORDER — LABETALOL HCL 5 MG/ML IV SOLN
20.0000 mg | INTRAVENOUS | Status: DC | PRN
  Administered 2022-07-29: 20 mg via INTRAVENOUS
  Filled 2022-07-29: qty 4

## 2022-07-29 MED ORDER — SODIUM CHLORIDE 0.9 % IV SOLN
INTRAVENOUS | Status: DC
Start: 2022-07-29 — End: 2022-07-29

## 2022-07-29 MED ORDER — OXYCODONE HCL 5 MG PO TABS
5.0000 mg | ORAL_TABLET | Freq: Four times a day (QID) | ORAL | Status: DC | PRN
  Administered 2022-07-29 – 2022-07-30 (×4): 5 mg via ORAL
  Filled 2022-07-29 (×4): qty 1

## 2022-07-29 MED ORDER — BACITRACIN ZINC 500 UNIT/GM EX OINT: TOPICAL_OINTMENT | Freq: Two times a day (BID) | CUTANEOUS | Status: DC

## 2022-07-29 MED ORDER — MIDODRINE HCL 5 MG PO TABS
5.0000 mg | ORAL_TABLET | Freq: Once | ORAL | Status: AC
  Administered 2022-07-29: 5 mg via ORAL
  Filled 2022-07-29: qty 1

## 2022-07-29 NOTE — Progress Notes (Signed)
Patient's blood pressures have remained elevated. Verified with MD about giving first dose of midodrine this AM. Per Dr. Gwenlyn Perking ok to give.

## 2022-07-29 NOTE — Assessment & Plan Note (Signed)
-   PT evaluation requested -Follow clinical response recommendation for safe discharge.

## 2022-07-29 NOTE — Progress Notes (Signed)
Progress Note   Patient: Jamie [name]:[id number] DOB: 11/14/1956 DOA: 07/27/2022     1 DOS: the patient was seen and examined on 07/29/2022   Brief hospital admission course: As per H&P written by Dr. Carren Rang on 07/28/22 Jamie [name] is a 65 y.o. female with medical history significant of anxiety, chronic pain, gastroenteritis, depression, hypertension, presents the ED with a chief complaint of fall.  Patient reports she has had generalized weakness, and she had a fall.  She cannot describe how she fell.  She does not know if it is a trip and fall, or she had dizziness or chest pain beforehand.  She is a poor historian, but also at the time of my exam she is being treated acutely for bradycardia and hypotension.  For this reason questions were limited.  She does report that she has chronic pain all over her body.  She continues to ask for oxycodone.  When advised that her blood pressure is too low, and any opiate could cause it to drop to levels that are not compatible with life, she asks if I can just do a low-dose opiate.  She then goes on to explain that when she is in a lot of pain her blood pressure drops.  She reports that she is having crampy abdominal pain that is normal for her IBS.  She can remember when she was diagnosed for IBS.  She reports the only medication she takes for is oxycodone which helps to slow down her diarrhea.  Patient denies any blood in her stool, or urine.  At the time of my exam patient is being transferred to the ICU after being given atropine, also further history is not obtained at this time.  Assessment and Plan: * Sinus bradycardia -Heart rate consistently low, as low as the 30s in the ER -No medications listed on her records that could cause AV node blockage -2D echo without wall motion abnormalities demonstrating just grade 1 diastolic dysfunction.  Ejection fraction more than 75%. -Excellent response to the use of atropine -Continue telemetry  monitoring -Follow electrolytes and replete as needed -Continue to follow heart rate and maintain adequate hydration.   Hypovolemic shock (HCC) - Requiring transient use of pressors and midodrine at time of admission -Appears to be associated with hypovolemic shock in the setting of GI losses and severe dehydration -After fluid resuscitation patient no longer requiring pressors support and is also of midodrine -Will continue to follow intake and output, close monitoring of vital signs and follow response.  Physical deconditioning - PT evaluation requested -Follow clinical response recommendation for safe discharge.  Lactic acidosis -Lactic acid 3.9 at time of admission. -Likely secondary to persistent hypotension and GI losses/dehydration. -Continue to maintain adequate hydration, follow vital signs and lactic acid trend.  Chronic diarrhea -Patient reports this is her normal IBS diarrhea -GI panel not demonstrating acute infection -Enteric precaution has been discontinued -Continue to maintain adequate hydration and follow electrolytes -Diet has been advanced. -Continue to maintain adequate hydration and will use Lomotil as needed.  UTI (urinary tract infection) Leukocytosis as high as 20,000, urine white blood cells 21-50 -Rocephin started empirically. -No other signs of source of infection appreciated -Patient denies dysuria and is currently afebrile. -Sepsis was rule out.  Chronic pain -Opiates are being held secondary to hypotension -Concerns of misuse analgesics as an outpatient -Continue the use of Tylenol and follow clinical response. -Patient also having some intermittent abdominal cramps from her IBS, will continue the use  of Bentyl.  Hypokalemia -Secondary to GI losses and poor p.o. intake -Improving/repleted -Continue to follow trend and further replete as needed -Potassium 3.5 currently.    Subjective:  Feeling weak and tired; no chest pain, no nausea, no  vomiting, no shortness of breath.  Expressed having increased frequency hematuria, loose stools or bloody bowel movement.  Tolerating diet and overall feeling better.  Physical Exam: Vitals:   07/29/22 1300 07/29/22 1400 07/29/22 1500 07/29/22 1554  BP: (!) 178/91 (!) 186/85 (!) 167/95   Pulse: 81 81 84   Resp: 14 15 14    Temp:    98.4 F (36.9 C)  TempSrc:    Oral  SpO2: 100% 100% 100%   Weight:      Height:       General exam: Alert, awake, oriented x 3; currently denying chest pain, palpitations, nausea and vomiting. Respiratory system: Good air movement bilaterally; no using accessory muscle.  Good saturation on room air. Cardiovascular system:RRR. No rubs or gallops; no JVD on exam. Gastrointestinal system: Abdomen is nondistended, soft and nontender. No organomegaly or masses felt. Normal bowel sounds heard. Central nervous system: Alert and oriented. No focal neurological deficits. Extremities: No cyanosis or clubbing. Skin: No petechiae. Psychiatry: Judgement and insight appear normal.  Flat affect.  Data Reviewed: Procalcitonin level less than 0.10 CBC: WBCs 11.3, hemoglobin 11.4 and platelets count 339 K CBC: Basic metabolic panel sodium 137, potassium 3.5, chloride 112, BUN 10, creatinine 0.80 and anion gap 5.   Family Communication: No family at bedside.  Disposition: Status is: Inpatient Remains inpatient appropriate because: Still receiving IV antibiotics for UTI; cultures pending.  Transition patient off midodrine and pressor support.  Will transfer to telemetry bed.  Zickel therapy evaluation requested.   Planned Discharge Destination: To be determined.  Patient would like to return back home with home health services.  Author: , MD 07/29/2022 4:18 PM  For on call review [email or web address].

## 2022-07-29 NOTE — Assessment & Plan Note (Signed)
-   Requiring transient use of pressors and midodrine at time of admission -Appears to be associated with hypovolemic shock in the setting of GI losses and severe dehydration -After fluid resuscitation patient no longer requiring pressors support and is also of midodrine -Will continue to follow intake and output, close monitoring of vital signs and follow response.

## 2022-07-30 DIAGNOSIS — K529 Noninfective gastroenteritis and colitis, unspecified: Secondary | ICD-10-CM | POA: Diagnosis not present

## 2022-07-30 DIAGNOSIS — E876 Hypokalemia: Secondary | ICD-10-CM | POA: Diagnosis not present

## 2022-07-30 DIAGNOSIS — R001 Bradycardia, unspecified: Secondary | ICD-10-CM | POA: Diagnosis not present

## 2022-07-30 DIAGNOSIS — G894 Chronic pain syndrome: Secondary | ICD-10-CM | POA: Diagnosis not present

## 2022-07-30 LAB — PROCALCITONIN: Procalcitonin: <0.1 ng/mL

## 2022-07-30 MED ORDER — LORATADINE 10 MG PO TABS: 10.0000 mg | ORAL_TABLET | Freq: Every day | ORAL | 1 refills | Status: AC

## 2022-07-30 MED ORDER — ACETAMINOPHEN 500 MG PO TABS: 1000.0000 mg | ORAL_TABLET | Freq: Three times a day (TID) | ORAL | Status: AC | PRN

## 2022-07-30 MED ORDER — PANTOPRAZOLE SODIUM 40 MG PO TBEC: 40.0000 mg | DELAYED_RELEASE_TABLET | Freq: Every day | ORAL | 1 refills | Status: AC

## 2022-07-30 MED ORDER — CEFUROXIME AXETIL 500 MG PO TABS: 500.0000 mg | ORAL_TABLET | Freq: Two times a day (BID) | ORAL | 0 refills | Status: AC

## 2022-07-30 MED ORDER — TRAZODONE HCL 100 MG PO TABS: 100.0000 mg | ORAL_TABLET | Freq: Every evening | ORAL | Status: AC | PRN

## 2022-07-30 NOTE — Discharge Summary (Signed)
Physician Discharge Summary   Patient: Jamie [name] MRN: [id number] DOB: [birth year]  Admit date:     07/27/2022  Discharge date: 07/30/22  Discharge Physician: Vassie Loll   PCP: Barbie Banner, MD   Recommendations at discharge:  Repeat basic metabolic panel to follow ultralights renal function Reassess blood pressure and start antihypertensive regimen if needed Make sure patient has completed antibiotics as instructed Outpatient follow-up with pain clinic recommended.  Discharge Diagnoses: Principal Problem:   Sinus bradycardia Active Problems:   Hypokalemia   Chronic pain   UTI (urinary tract infection)   Chronic diarrhea   Lactic acidosis   Physical deconditioning   Hypovolemic shock (HCC)  Brief hospital admission course: As per H&P written by Dr. Carren Rang on 07/28/22 Jamie [name] is a 66 y.o. female with medical history significant of anxiety, chronic pain, gastroenteritis, depression, hypertension, presents the ED with a chief complaint of fall.  Patient reports she has had generalized weakness, and she had a fall.  She cannot describe how she fell.  She does not know if it is a trip and fall, or she had dizziness or chest pain beforehand.  She is a poor historian, but also at the time of my exam she is being treated acutely for bradycardia and hypotension.  For this reason questions were limited.  She does report that she has chronic pain all over her body.  She continues to ask for oxycodone.  When advised that her blood pressure is too low, and any opiate could cause it to drop to levels that are not compatible with life, she asks if I can just do a low-dose opiate.  She then goes on to explain that when she is in a lot of pain her blood pressure drops.  She reports that she is having crampy abdominal pain that is normal for her IBS.  She can remember when she was diagnosed for IBS.  She reports the only medication she takes for is oxycodone which helps to slow down her  diarrhea.  Patient denies any blood in her stool, or urine.  At the time of my exam patient is being transferred to the ICU after being given atropine, also further history is not obtained at this time.    Assessment and Plan: * Sinus bradycardia -Heart rate consistently low of admission, as low as the 30s in the ER -With concern for home medication misuse. -2D echo without wall motion abnormalities demonstrating just grade 1 diastolic dysfunction.  Ejection fraction more than 75%. -Excellent response to the use of atropine X1. -Continue telemetry monitoring -continue Follow electrolytes and further replete as needed -Continue to adequate hydration.   Hypovolemic shock (HCC) - Requiring transient use of pressors and midodrine at time of admission -Appears to be associated with hypovolemic shock in the setting of GI losses and severe dehydration -After fluid resuscitation patient no longer requiring pressors support and is also of midodrine -Will continue to follow intake and output, close monitoring of vital signs and follow response.  Physical deconditioning - PT evaluation requested and appreciated -Patient having discharge with home health PT/home health RN orders to facilitate transfer.  Lactic acidosis -Lactic acid 3.9 at time of admission. -Likely secondary to persistent hypotension and GI losses/dehydration. -She has been advised to maintain adequate hydration.  Chronic diarrhea -Patient reports this is her normal IBS diarrhea -GI panel not demonstrating acute infection -Enteric precaution has been discontinued -Continue to maintain adequate hydration and follow electrolytes -Diet has been advanced. -  Continue to maintain adequate hydration and contunue as needed loperamide.  UTI (urinary tract infection) -Leukocytosis as high as 20,000, urine white blood cells 21-50 -Patient was afebrile and tolerating p.o.'s at time of discharge. -Antibiotics transitioned to Ceftin at  time of discharge to complete therapy. -No other signs or source of infection appreciated -Patient denies dysuria and is currently afebrile. -Sepsis was rule out.  Chronic pain -Opiates were held secondary to hypotension -Concerns of misuse analgesics as an outpatient -Continue the use of Tylenol and follow clinical response. -Patient also having some intermittent abdominal cramps from her IBS, Bentyl was use as needed while inpatient.  Hypokalemia -Secondary to GI losses and poor p.o. intake -repleted -Continue to follow trend and further replete as needed -Potassium 3.5 at time of discharge.    Consultants: None Procedures performed: See below for x-ray reports. Disposition: Home with home health services. Diet recommendation: Heart healthy diet.  Discharge Diet Orders (From admission, onward)   DISCHARGE MEDICATION: Allergies as of 07/30/2022       Reactions   Amoxicillin-pot Clavulanate Nausea And Vomiting   Can tolerate plain Amoxicillin   Azithromycin Nausea And Vomiting   Other reaction(s): Other (See Comments) unknown   Bupropion Nausea And Vomiting   Nsaids Other (See Comments)   Stomach problems   Prednisone Diarrhea   Vilazodone Diarrhea        Medication List     STOP taking these medications    OVER THE COUNTER MEDICATION   potassium chloride 10 MEQ CR capsule Commonly known as: MICRO-K   tiZANidine 4 MG tablet Commonly known as: ZANAFLEX   ZzzQuil 50 MG/30ML Liqd Generic drug: diphenhydrAMINE HCl (Sleep)       TAKE these medications    acetaminophen 500 MG tablet Commonly known as: TYLENOL Take 2 tablets (1,000 mg total) by mouth every 8 (eight) hours as needed for mild pain or headache (or Fever >/= 101).   ALPRAZolam 1 MG tablet Commonly known as: XANAX Take 1 tablet (1 mg total) by mouth 3 (three) times daily as needed for anxiety or sleep.   butalbital-acetaminophen-caffeine 50-325-40 MG tablet Commonly known as: FIORICET Take  1-2 tablets by mouth 2 (two) times daily as needed for headache.   cefUROXime 500 MG tablet Commonly known as: CEFTIN Take 1 tablet (500 mg total) by mouth 2 (two) times daily with a meal for 3 days.   loperamide 2 MG capsule Commonly known as: IMODIUM Take 2 mg by mouth every 6 (six) hours as needed.   loratadine 10 MG tablet Commonly known as: CLARITIN Take 1 tablet (10 mg total) by mouth daily. Start taking on: July 31, 2022   pantoprazole 40 MG tablet Commonly known as: PROTONIX Take 1 tablet (40 mg total) by mouth daily.   traZODone 100 MG tablet Commonly known as: DESYREL Take 1 tablet (100 mg total) by mouth at bedtime as needed for sleep. What changed:  when to take this reasons to take this        Follow-up Information     [facility] Follow up.   Why: PT will call to schedule your first home visit.        Barbie Banner, MD. Schedule an appointment as soon as possible for a visit in 10 day(s).   Specialty: Family Medicine Contact information: [address] [place] [address] [phone]                Discharge Exam: Ceasar Mons Weights  07/27/22 1450  Weight: 51 kg   General exam: Alert, awake, oriented x 3; currently denying chest pain, palpitations, nausea and vomiting. Respiratory system: Good air movement bilaterally; no using accessory muscle.  Good saturation on room air. Cardiovascular system:RRR. No rubs or gallops; no JVD on exam. Gastrointestinal system: Abdomen is nondistended, soft and nontender. No organomegaly or masses felt. Normal bowel sounds heard. Central nervous system: Alert and oriented. No focal neurological deficits. Extremities: No cyanosis or clubbing. Skin: No petechiae. Psychiatry: Judgement and insight appear normal.  Flat affect.  Condition at discharge: Stable and improved.  The results of significant diagnostics from this hospitalization (including imaging, microbiology, ancillary and  laboratory) are listed below for reference.   Imaging Studies: ECHOCARDIOGRAM COMPLETE  Result Date: 07/28/2022    ECHOCARDIOGRAM REPORT   Patient Name:   Jamie [name] Date of Exam: 07/28/2022 Medical Rec #:  [id number]    Height:       65.0 in Accession #:    [id number]   Weight:       112.4 lb Date of Birth:  31-Aug-1956    BSA:          1.548 m Patient Age:    65 years     BP:           121/52 mmHg Patient Gender: F            HR:           84 bpm. [facility] Procedure: 2D Echo, Cardiac Doppler and Color Doppler Indications:    Murmur  History:        Patient has no prior history of Echocardiogram examinations.                 Arrythmias:Bradycardia; Signs/Symptoms:Murmur.  Sonographer:    Mikki Harbor Referring Phys: [id number] ASIA B ZIERLE-GHOSH  Sonographer Comments: Image acquisition challenging due to respiratory motion. IMPRESSIONS  1. Left ventricular ejection fraction, by estimation, is >75%. The left ventricle has hyperdynamic function. The left ventricle has no regional wall motion abnormalities. There is mild asymmetric left ventricular hypertrophy of the basal-septal segment.  Left ventricular diastolic parameters are consistent with Grade I diastolic dysfunction (impaired relaxation).  2. Right ventricular systolic function is normal. The right ventricular size is normal. There is normal pulmonary artery systolic pressure.  3. The mitral valve is normal in structure. Mild mitral valve regurgitation.  4. The aortic valve is tricuspid. Aortic valve regurgitation is not visualized. Aortic valve sclerosis is present, with no evidence of aortic valve stenosis. There are mildly elevated gradients across the aortic valve due to high output state with no evidence of valvular stenosis.  5. The inferior vena cava is normal in size with greater than 50% respiratory variability, suggesting right atrial pressure of 3 mmHg. Comparison(s): No prior Echocardiogram. FINDINGS  Left Ventricle: Left  ventricular ejection fraction, by estimation, is >75%. The left ventricle has hyperdynamic function. The left ventricle has no regional wall motion abnormalities. The left ventricular internal cavity size was normal in size. There is mild asymmetric left ventricular hypertrophy of the basal-septal segment. Left ventricular diastolic parameters are consistent with Grade I diastolic dysfunction (impaired relaxation). A small intracavitary gradient is present in the setting of a hyperdynamic LV with peak gradient .  Right Ventricle: The right ventricular size is normal. No increase in right ventricular wall thickness. Right ventricular systolic function is normal. There is normal pulmonary artery systolic pressure. The tricuspid regurgitant velocity is  2.43 m/s, and  with an assumed right atrial pressure of 3 mmHg, the estimated right ventricular systolic pressure is 26.6 mmHg. Left Atrium: Left atrial size was normal in size. Right Atrium: Right atrial size was normal in size. Pericardium: There is no evidence of pericardial effusion. Mitral Valve: The mitral valve is normal in structure. Mild mitral valve regurgitation. MV peak gradient, 8.0 mmHg. The mean mitral valve gradient is 3.0 mmHg. Tricuspid Valve: The tricuspid valve is normal in structure. Tricuspid valve regurgitation is trivial. Aortic Valve: The aortic valve is tricuspid. Aortic valve regurgitation is not visualized. Aortic valve sclerosis is present, with no evidence of aortic valve stenosis. Aortic valve mean gradient measures 10.7 mmHg. Aortic valve peak gradient measures 19.6 mmHg. Aortic valve area, by VTI measures 2.45 cm. Pulmonic Valve: The pulmonic valve was not well visualized. Pulmonic valve regurgitation is trivial. Aorta: The aortic root is normal in size and structure. Venous: The inferior vena cava is normal in size with greater than 50% respiratory variability, suggesting right atrial pressure of 3 mmHg. IAS/Shunts: The atrial  septum is grossly normal.  LEFT VENTRICLE PLAX 2D LVIDd:         4.20 cm   Diastology LVIDs:         2.30 cm   LV e' medial:    7.72 cm/s LV PW:         0.90 cm   LV E/e' medial:  11.4 LV IVS:        1.10 cm   LV e' lateral:   10.00 cm/s LVOT diam:     2.00 cm   LV E/e' lateral: 8.8 LV SV:         118 LV SV Index:   76 LVOT Area:     3.14 cm  RIGHT VENTRICLE RV Basal diam:  2.85 cm RV Mid diam:    2.60 cm RV S prime:     15.30 cm/s TAPSE (M-mode): 2.2 cm LEFT ATRIUM             Index        RIGHT ATRIUM           Index LA diam:        3.70 cm 2.39 cm/m   RA Area:     15.10 cm LA Vol (A2C):   40.6 ml 26.22 ml/m  RA Volume:   39.70 ml  25.64 ml/m LA Vol (A4C):   31.1 ml 20.08 ml/m LA Biplane Vol: 38.4 ml 24.80 ml/m  AORTIC VALVE                     PULMONIC VALVE AV Area (Vmax):    2.64 cm      PV Vmax:       0.91 m/s AV Area (Vmean):   2.55 cm      PV Peak grad:  3.3 mmHg AV Area (VTI):     2.45 cm AV Vmax:           221.33 cm/s AV Vmean:          152.000 cm/s AV VTI:            0.481 m AV Peak Grad:      19.6 mmHg AV Mean Grad:      10.7 mmHg LVOT Vmax:         186.00 cm/s LVOT Vmean:        123.500 cm/s LVOT VTI:  0.375 m LVOT/AV VTI ratio: 0.78  AORTA Ao Root diam: 3.30 cm MITRAL VALVE               TRICUSPID VALVE MV Area (PHT): 2.69 cm    TR Peak grad:   23.6 mmHg MV Area VTI:   3.35 cm    TR Vmax:        243.00 cm/s MV Peak grad:  8.0 mmHg MV Mean grad:  3.0 mmHg    SHUNTS MV Vmax:       1.41 m/s    Systemic VTI:  0.37 m MV Vmean:      81.4 cm/s   Systemic Diam: 2.00 cm MV Decel Time: 282 msec MV E velocity: 88.30 cm/s MV A velocity: 96.80 cm/s MV E/A ratio:  0.91 Laurance Flatten MD Electronically signed by Laurance Flatten MD Signature Date/Time: 07/28/2022/3:01:18 PM    Final    DG Chest 2 View  Result Date: 07/27/2022 CLINICAL DATA:  Dizzy and generalized pain EXAM: CHEST - 2 VIEW COMPARISON:  Radiographs 07/18/2022 FINDINGS: Stable cardiomediastinal silhouette.  Right-greater-than-left basilar atelectasis. No pleural effusion or pneumothorax. IMPRESSION: Right basilar airspace opacity favored to represent atelectasis. Electronically Signed   By: Minerva Fester M.D.   On: 07/27/2022 19:36   EEG adult  Result Date: 07/19/2022 Charlsie Quest, MD     07/19/2022  4:58 PM Patient Name: LURENA NAEVE MRN: [id number] Epilepsy Attending: Charlsie Quest Referring Physician/Provider: Shon Hale, MD Date: 07/19/2022 Duration: 23.26 mins Patient history: 66yo F with recurrent seizure like activity. EEG to evaluate for seizure. Level of alertness: Awake AEDs during EEG study: Ativan Technical aspects: This EEG study was done with scalp electrodes positioned according to the 10-20 International system of electrode placement. Electrical activity was acquired at a sampling rate of  and reviewed with a high frequency filter of  and a low frequency filter of . EEG data were recorded continuously and digitally stored. Description: No clear posterior dominant rhythm was seen. EEG showed continuous generalized polymorphic 3 to 6 Hz theta-delta slowing admixed with 15 to 18 Hz beta activity. Hyperventilation and photic stimulation were not performed.   ABNORMALITY - Continuous slow, generalized IMPRESSION: This study is suggestive of moderate diffuse encephalopathy, nonspecific etiology. No seizures or epileptiform discharges were seen throughout the recording. Charlsie Quest   MR BRAIN WO CONTRAST  Result Date: 07/19/2022 CLINICAL DATA:  66 year old female with altered mental status. Seizure. EXAM: MRI HEAD WITHOUT CONTRAST TECHNIQUE: Multiplanar, multiecho pulse sequences of the brain and surrounding structures were obtained without intravenous contrast. COMPARISON:  Head CT yesterday, and earlier. FINDINGS: Study is intermittently degraded by motion artifact despite repeated imaging attempts. Brain: No restricted diffusion to suggest acute infarction. No midline  shift, mass effect, evidence of mass lesion, ventriculomegaly, extra-axial collection or acute intracranial hemorrhage. Cervicomedullary junction and pituitary are within normal limits. Scattered Patchy and confluent bilateral cerebral white matter T2 and FLAIR hyperintensity is in a nonspecific configuration, moderately advanced for age. No superimposed cortical encephalomalacia or chronic cerebral blood products identified. Deep gray matter nuclei, brainstem, and cerebellum appear spared. On routine coronal T2 imaging the hippocampal formations (series 14, image 15) at other mesial temporal lobe structures appear within normal limits. Vascular: Major intracranial vascular flow voids are grossly preserved. Skull and upper cervical spine: Advanced cervical spine degeneration visible at C3-C4. At least mild spinal stenosis there. Visualized bone marrow signal is within normal limits. Sinuses/Orbits: Negative. Other: Mild bilateral mastoid air cell effusions appear  to be chronic and not significantly changed from last year. Negative visible scalp and face. IMPRESSION: 1. No acute intracranial abnormality on mildly motion degraded exam. 2. Moderately advanced for age cerebral white matter signal changes are nonspecific, but most commonly due to chronic small vessel disease. 3. 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 Electronically Signed   By: Odessa Fleming M.D.   On: 07/19/2022 10:17   CT Head Wo Contrast  Result Date: 07/18/2022 CLINICAL DATA:  Worsening headache altered with seizure EXAM: CT HEAD WITHOUT CONTRAST TECHNIQUE: Contiguous axial images were obtained from the base of the skull through the vertex without intravenous contrast. RADIATION DOSE REDUCTION: This exam was performed according to the departmental dose-optimization program which includes automated exposure control, adjustment of the mA and/or kV according to patient size and/or use of iterative reconstruction  technique. COMPARISON:  CT brain 05/27/2022 FINDINGS: Brain: No acute territorial infarction, hemorrhage or intracranial mass. Motion degradation. Atrophy and chronic small vessel ischemic changes of the white matter. Stable ventricle size Vascular: No hyperdense vessels.  No unexpected calcification Skull: Normal. Negative for fracture or focal lesion. Sinuses/Orbits: No acute finding. Other: None IMPRESSION: 1. No CT evidence for acute intracranial abnormality. 2. Atrophy and chronic small vessel ischemic changes of the white matter Electronically Signed   By: Jasmine Pang M.D.   On: 07/18/2022 18:43   DG Chest Port 1 View  Result Date: 07/18/2022 CLINICAL DATA:  Syncope EXAM: PORTABLE CHEST 1 VIEW COMPARISON:  05/13/2022 FINDINGS: Cardiac size is within normal limits. There are no signs of pulmonary edema or focal consolidation. Small linear densities in the lower lung fields have not changed, possibly suggesting scarring. There is no pleural effusion or pneumothorax. IMPRESSION: No active disease. Electronically Signed   By: Ernie Avena M.D.   On: 07/18/2022 16:16    Microbiology: Results for orders placed or performed during the hospital encounter of 07/27/22  Urine Culture     Status: Abnormal   Collection Time: 07/27/22  5:00 PM   Specimen: Urine, Clean Catch  Result Value Ref Range Status   Specimen Description   Final    URINE, CLEAN CATCH Performed at [facility], [address] [place], [place] [address]    Special Requests   Final    NONE Performed at [facility], [address], [place], [place] [address]    Culture (A)  Final    30,000 COLONIES/mL MULTIPLE SPECIES PRESENT, SUGGEST RECOLLECTION   Report Status 07/29/2022 FINAL  Final  MRSA Next Gen by PCR, Nasal     Status: None   Collection Time: 07/28/22  2:52 AM   Specimen: Nasal Mucosa; Nasal Swab  Result Value Ref Range Status   MRSA by PCR Next Gen NOT DETECTED NOT DETECTED Final    Comment: (NOTE) The  GeneXpert MRSA Assay (FDA approved for NASAL specimens only), is one component of a comprehensive MRSA colonization surveillance program. It is not intended to diagnose MRSA infection nor to guide or monitor treatment for MRSA infections. Test performance is not FDA approved in patients less than 70 years old. Performed at [facility], [address] [place], [place] [address]   Gastrointestinal Panel by PCR , Stool     Status: None   Collection Time: 07/28/22  9:00 AM  Result Value Ref Range Status   Campylobacter species NOT DETECTED NOT DETECTED Final   Plesimonas shigelloides NOT DETECTED NOT DETECTED Final   Salmonella species NOT DETECTED NOT  DETECTED Final   Yersinia enterocolitica NOT DETECTED NOT DETECTED Final   Vibrio species NOT DETECTED NOT DETECTED Final   Vibrio cholerae NOT DETECTED NOT DETECTED Final   Enteroaggregative E coli (EAEC) NOT DETECTED NOT DETECTED Final   Enteropathogenic E coli (EPEC) NOT DETECTED NOT DETECTED Final   Enterotoxigenic E coli (ETEC) NOT DETECTED NOT DETECTED Final   Shiga like toxin producing E coli (STEC) NOT DETECTED NOT DETECTED Final   Shigella/Enteroinvasive E coli (EIEC) NOT DETECTED NOT DETECTED Final   Cryptosporidium NOT DETECTED NOT DETECTED Final   Cyclospora cayetanensis NOT DETECTED NOT DETECTED Final   Entamoeba histolytica NOT DETECTED NOT DETECTED Final   Giardia lamblia NOT DETECTED NOT DETECTED Final   Adenovirus F40/41 NOT DETECTED NOT DETECTED Final   Astrovirus NOT DETECTED NOT DETECTED Final   Norovirus GI/GII NOT DETECTED NOT DETECTED Final   Rotavirus A NOT DETECTED NOT DETECTED Final   Sapovirus (I, II, IV, and V) NOT DETECTED NOT DETECTED Final    Comment: Performed at [facility], [address] [place], [place] [address]    Labs: CBC: Recent Labs  Lab 07/27/22 1714 07/28/22 0042 07/28/22 0609 07/29/22 0408  WBC 14.8* 17.8* 20.3* 11.3*  NEUTROABS 9.3* 14.7*  --   --   HGB 14.1 12.2 13.2  11.4*  HCT 43.6 38.4 40.4 35.6*  MCV 89.9 93.4 91.6 91.8  PLT 526* 430* 531* 339   Basic Metabolic Panel: Recent Labs  Lab 07/27/22 1714 07/27/22 1925 07/28/22 0042 07/28/22 1148 07/29/22 0408  NA 133*  --  137 135 137  K 3.6  --  3.3* 3.7 3.5  CL 100  --  106 109 112*  CO2 21*  --  20* 20* 20*  GLUCOSE 111*  --  105* 132* 90  BUN 14  --  CREATININE 0.90  --  1.05* 0.91 0.80  CALCIUM 9.7  --  8.9 8.6* 8.5*  MG  --  2.2 2.0  --   --    Liver Function Tests: Recent Labs  Lab 07/27/22 1714 07/28/22 0042  AST 16 16  ALT 27 24  ALKPHOS 170* 140*  BILITOT 0.8 0.7  PROT 7.1 6.0*  ALBUMIN 3.6 3.0*   CBG: No results for input(s): "GLUCAP" in the last 168 hours.  Discharge time spent: greater than 30 minutes.  Signed: Vassie Loll, MD [facility] 07/30/2022

## 2022-07-30 NOTE — TOC Transition Note (Signed)
Transition of Care [facility]) - CM/SW Discharge Note   Patient Details  Name: Jamie [name] MRN: [id number] Date of Birth: [birth year]  [facility] CM/SW Contact:  Karn Cassis, LCSW Phone Number: 07/30/2022, 1:22 PM   Clinical Narrative: Pt d/c today. PT evaluated pt and recommend home health. Pt was [facility]. MD adding RN. Orders in. Sarah with [facility] notified. Per RN, pt will need ride home today. LCSW provided number to [employer] for unit to arrange.       Final next level of care: Home w Home Health Services Barriers to Discharge: Barriers Resolved   Patient Goals and CMS Choice Patient states their goals for this hospitalization and ongoing recovery are:: to go home. CMS Medicare.gov Compare Post Acute Care list provided to:: Patient Represenative (must comment) Choice offered to / list presented to : Adult Children  Discharge Placement                       Discharge Plan and Services     Post Acute Care Choice: Home Health                    HH Arranged: RN, PT   Date [facility] Agency Contacted: 07/30/22 Time HH Agency Contacted: 1321 Representative spoke with at [facility] Agency: Sarah with [employer]  Social Determinants of Health (SDOH) Interventions     Readmission Risk Interventions    07/28/2022    1:55 PM  Readmission Risk Prevention Plan  Transportation Screening Complete  Medication Review [employer]) Complete  PCP or Specialist appointment within 3-5 days of discharge Not Complete  HRI or Home Care Consult Complete  SW Recovery Care/Counseling Consult Complete  Palliative Care Screening Not Applicable  Skilled Nursing Facility Not Applicable

## 2022-07-30 NOTE — Progress Notes (Signed)
Per patient she is unable to drive herself home and has no way of getting home. Called Daughter Leeroy Bock listed in chart and she states she is not on good terms with patient at this time and that the patient is going to have to find her way home. [place] with SW informed. MD informed. PT recommends home with HH.

## 2022-07-30 NOTE — Progress Notes (Signed)
Patient mad frequent requests for oxycodone this shift, requesting medication 1-2 hours prior to time for medication. States pain is no lower than 10 on 0-10 scale for arthritis. Blood pressures running 160-170's systolic and diastolic running in 90's, Dr. Emelia SalisburyCamillo Flaming notified. No new orders at this time. Patient did ambulate with assistance and walker multiple times to the bathroom this shift. Will continue to monitor patient.

## 2022-07-30 NOTE — Evaluation (Signed)
Physical Therapy Evaluation Patient Details Name: Jamie [name] MRN: [id number] DOB: 12/23/1956 Today's Date: 07/30/2022  History of Present Illness  Jamie [name] is a 66 y.o. female with medical history significant of anxiety, chronic pain, gastroenteritis, depression, hypertension, presents the ED with a chief complaint of fall.  Patient reports she has had generalized weakness, and she had a fall.  She cannot describe how she fell.  She does not know if it is a trip and fall, or she had dizziness or chest pain beforehand.  She is a poor historian, but also at the time of my exam she is being treated acutely for bradycardia and hypotension.  For this reason questions were limited.  She does report that she has chronic pain all over her body.  She continues to ask for oxycodone.  When advised that her blood pressure is too low, and any opiate could cause it to drop to levels that are not compatible with life, she asks if I can just do a low-dose opiate.  She then goes on to explain that when she is in a lot of pain her blood pressure drops.  She reports that she is having crampy abdominal pain that is normal for her IBS.  She can remember when she was diagnosed for IBS.  She reports the only medication she takes for is oxycodone which helps to slow down her diarrhea.  Patient denies any blood in her stool, or urine.  At the time of my exam patient is being transferred to the ICU after being given atropine, also further history is not obtained at this time.   Clinical Impression  Patient functioning near baseline for functional mobility and gait demonstrating good return for bed mobility, transfers and ambulation in room/hallway without loss of balance.  Patient requested to go back to bed due to lack of sleep overnight.  Patient encouraged to ambulate with nursing staff as tolerated for length of stay.  Plan:  Patient discharged from physical therapy to care of nursing for ambulation daily as tolerated for  length of stay.         Recommendations for follow up therapy are one component of a multi-disciplinary discharge planning process, led by the attending physician.  Recommendations may be updated based on patient status, additional functional criteria and insurance authorization.  Follow Up Recommendations Home health PT      Assistance Recommended at Discharge PRN  Patient can return home with the following  A little help with walking and/or transfers;A little help with bathing/dressing/bathroom;Help with stairs or ramp for entrance;Assistance with cooking/housework    Equipment Recommendations None recommended by PT  Recommendations for Other Services       Functional Status Assessment Patient has had a recent decline in their functional status and demonstrates the ability to make significant improvements in function in a reasonable and predictable amount of time.     Precautions / Restrictions Precautions Precautions: Fall Restrictions Weight Bearing Restrictions: No      Mobility  Bed Mobility Overal bed mobility: Modified Independent                  Transfers Overall transfer level: Modified independent                 General transfer comment: good return for transferring to/from commode in bathroom    Ambulation/Gait Ambulation/Gait assistance: Modified independent (Device/Increase time), Supervision Gait Distance (Feet): 85 Feet Assistive device: Rolling walker (2 wheels) Gait Pattern/deviations: Decreased step length -  right, Decreased step length - left, Decreased stride length Gait velocity: decreased     General Gait Details: slightly labored cadenc without loss of balance, limited mostly due to c/o fatigue  Stairs            Wheelchair Mobility    Modified Rankin (Stroke Patients Only)       Balance Overall balance assessment: Needs assistance Sitting-balance support: Feet supported, No upper extremity supported Sitting  balance-Leahy Scale: Good Sitting balance - Comments: seated at EOB   Standing balance support: During functional activity, Bilateral upper extremity supported Standing balance-Leahy Scale: Fair Standing balance comment: fair/good using RW                             Pertinent Vitals/Pain Pain Assessment Pain Assessment: Faces Faces Pain Scale: Hurts little more Pain Location: left knee Pain Descriptors / Indicators: Sore Pain Intervention(s): Limited activity within patient's tolerance, Monitored during session, Repositioned    Home Living Family/patient expects to be discharged to:: Private residence Living Arrangements: Alone Available Help at Discharge: Personal care attendant;Family;Available PRN/intermittently Type of Home: House Home Access: Stairs to enter Entrance Stairs-Rails: None Entrance Stairs-Number of Steps: 3   Home Layout: One level;Other (Comment) Home Equipment: Rolling Walker (2 wheels);Shower seat - built in;BSC/3in1;Cane - quad;Cane - single point      Prior Function Prior Level of Function : Needs assist;Driving;History of Falls (last six months)       Physical Assist : Mobility (physical);ADLs (physical) Mobility (physical): Bed mobility;Transfers;Gait;Stairs   Mobility Comments: household and short distanced community ambulator using SPC or RW, has not been driving per MD request due to recent seizure ADLs Comments: Home aides M & F x 3 hours/day     Hand Dominance   Dominant Hand: Right    Extremity/Trunk Assessment   Upper Extremity Assessment Upper Extremity Assessment: Overall WFL for tasks assessed    Lower Extremity Assessment Lower Extremity Assessment: Overall WFL for tasks assessed    Cervical / Trunk Assessment Cervical / Trunk Assessment: Kyphotic  Communication   Communication: No difficulties  Cognition Arousal/Alertness: Awake/alert Behavior During Therapy: WFL for tasks assessed/performed Overall  Cognitive Status: Within Functional Limits for tasks assessed                                          General Comments      Exercises     Assessment/Plan    PT Assessment All further PT needs can be met in the next venue of care  PT Problem List Decreased strength;Decreased activity tolerance;Decreased balance;Decreased mobility       PT Treatment Interventions      PT Goals (Current goals can be found in the Care Plan section)  Acute Rehab PT Goals Patient Stated Goal: return home with home aides to assist PT Goal Formulation: With patient Time For Goal Achievement: 07/30/22 Potential to Achieve Goals: Good    Frequency       Co-evaluation               AM-PAC PT "6 Clicks" Mobility  Outcome Measure Help needed turning from your back to your side while in a flat bed without using bedrails?: None Help needed moving from lying on your back to sitting on the side of a flat bed without using bedrails?: None Help needed moving to  and from a bed to a chair (including a wheelchair)?: A Little Help needed standing up from a chair using your arms (e.g., wheelchair or bedside chair)?: A Little Help needed to walk in hospital room?: A Little Help needed climbing 3-5 steps with a railing? : A Little 6 Click Score: 20    End of Session   Activity Tolerance: Patient tolerated treatment well;Patient limited by fatigue Patient left: in bed;with call bell/phone within reach Nurse Communication: Mobility status PT Visit Diagnosis: Unsteadiness on feet (R26.81);Other abnormalities of gait and mobility (R26.89);Muscle weakness (generalized) (M62.81)    Time: 7494-4967 PT Time Calculation (min) (ACUTE ONLY): 24 min   Charges:   PT Evaluation $PT Eval Moderate Complexity: 1 Mod PT Treatments $Therapeutic Activity: 23-37 mins        12:17 PM, 07/30/22 Lonell Grandchild, MPT Physical Therapist with [facility] [phone] office [phone]  mobile phone

## 2022-09-28 ENCOUNTER — Inpatient Hospital Stay (HOSPITAL_COMMUNITY)
Admission: EM | Admit: 2022-09-28 | Discharge: 2022-10-04 | DRG: 871 | Disposition: A | Discharge status: Home Health | Payer: Medicare HMO | Attending: Family Medicine | Admitting: Family Medicine

## 2022-09-28 DIAGNOSIS — E440 Moderate protein-calorie malnutrition: Secondary | ICD-10-CM | POA: Insufficient documentation

## 2022-09-28 DIAGNOSIS — R001 Bradycardia, unspecified: Secondary | ICD-10-CM

## 2022-09-28 DIAGNOSIS — A419 Sepsis, unspecified organism: Principal | ICD-10-CM | POA: Diagnosis present

## 2022-09-28 DIAGNOSIS — F419 Anxiety disorder, unspecified: Secondary | ICD-10-CM | POA: Diagnosis present

## 2022-09-28 DIAGNOSIS — J9811 Atelectasis: Secondary | ICD-10-CM | POA: Diagnosis not present

## 2022-09-28 DIAGNOSIS — F32A Depression, unspecified: Secondary | ICD-10-CM | POA: Diagnosis present

## 2022-09-28 DIAGNOSIS — R569 Unspecified convulsions: Secondary | ICD-10-CM | POA: Diagnosis present

## 2022-09-28 DIAGNOSIS — G9341 Metabolic encephalopathy: Secondary | ICD-10-CM | POA: Diagnosis present

## 2022-09-28 DIAGNOSIS — R9431 Abnormal electrocardiogram [ECG] [EKG]: Secondary | ICD-10-CM | POA: Diagnosis present

## 2022-09-28 DIAGNOSIS — R578 Other shock: Secondary | ICD-10-CM | POA: Diagnosis not present

## 2022-09-28 DIAGNOSIS — E8770 Fluid overload, unspecified: Secondary | ICD-10-CM | POA: Diagnosis present

## 2022-09-28 DIAGNOSIS — R112 Nausea with vomiting, unspecified: Secondary | ICD-10-CM

## 2022-09-28 DIAGNOSIS — G4700 Insomnia, unspecified: Secondary | ICD-10-CM | POA: Diagnosis present

## 2022-09-28 DIAGNOSIS — E874 Mixed disorder of acid-base balance: Secondary | ICD-10-CM | POA: Diagnosis present

## 2022-09-28 DIAGNOSIS — E876 Hypokalemia: Secondary | ICD-10-CM | POA: Diagnosis present

## 2022-09-28 DIAGNOSIS — L89150 Pressure ulcer of sacral region, unstageable: Secondary | ICD-10-CM | POA: Diagnosis present

## 2022-09-28 DIAGNOSIS — J189 Pneumonia, unspecified organism: Secondary | ICD-10-CM | POA: Diagnosis present

## 2022-09-28 DIAGNOSIS — E861 Hypovolemia: Secondary | ICD-10-CM | POA: Diagnosis not present

## 2022-09-28 DIAGNOSIS — Z880 Allergy status to penicillin: Secondary | ICD-10-CM

## 2022-09-28 DIAGNOSIS — I9589 Other hypotension: Secondary | ICD-10-CM | POA: Diagnosis present

## 2022-09-28 DIAGNOSIS — K589 Irritable bowel syndrome without diarrhea: Secondary | ICD-10-CM | POA: Diagnosis present

## 2022-09-28 DIAGNOSIS — Z886 Allergy status to analgesic agent status: Secondary | ICD-10-CM

## 2022-09-28 DIAGNOSIS — M549 Dorsalgia, unspecified: Secondary | ICD-10-CM | POA: Diagnosis present

## 2022-09-28 DIAGNOSIS — Z888 Allergy status to other drugs, medicaments and biological substances status: Secondary | ICD-10-CM

## 2022-09-28 DIAGNOSIS — M25562 Pain in left knee: Secondary | ICD-10-CM | POA: Diagnosis present

## 2022-09-28 DIAGNOSIS — R030 Elevated blood-pressure reading, without diagnosis of hypertension: Secondary | ICD-10-CM | POA: Diagnosis present

## 2022-09-28 DIAGNOSIS — Z79899 Other long term (current) drug therapy: Secondary | ICD-10-CM

## 2022-09-28 DIAGNOSIS — I5031 Acute diastolic (congestive) heart failure: Secondary | ICD-10-CM

## 2022-09-28 DIAGNOSIS — R7989 Other specified abnormal findings of blood chemistry: Secondary | ICD-10-CM | POA: Diagnosis present

## 2022-09-28 DIAGNOSIS — R579 Shock, unspecified: Secondary | ICD-10-CM

## 2022-09-28 DIAGNOSIS — G8929 Other chronic pain: Secondary | ICD-10-CM | POA: Diagnosis present

## 2022-09-28 DIAGNOSIS — Z6823 Body mass index (BMI) 23.0-23.9, adult: Secondary | ICD-10-CM

## 2022-09-28 DIAGNOSIS — F13239 Sedative, hypnotic or anxiolytic dependence with withdrawal, unspecified: Secondary | ICD-10-CM | POA: Diagnosis present

## 2022-09-28 DIAGNOSIS — I959 Hypotension, unspecified: Secondary | ICD-10-CM

## 2022-09-28 DIAGNOSIS — R627 Adult failure to thrive: Secondary | ICD-10-CM | POA: Diagnosis present

## 2022-09-28 NOTE — ED Triage Notes (Addendum)
Pt BIB EMS from home. Pt lives alone and EMS had to break door down to get in. Pt called EMS for new immobility and pain all over. EMS reports pt laying halfway off of couch, covered in feces and urine. Pt states she has been laying there "for weeks". Pt initially bradycardic in the mid 40s, BP 90 palp. EMS gave 500 of fluids and 1 atropine - reports HR came up to 58 with some PVCs.  Last BP 96/57 98% RA, end tidal 22 CBG 91 Pt is alert, reporting pain all over, pt with  breakdown all over bottom 20RFA

## 2022-09-29 ENCOUNTER — Other Ambulatory Visit: Payer: Self-pay

## 2022-09-29 ENCOUNTER — Emergency Department (HOSPITAL_COMMUNITY): Payer: Medicare HMO

## 2022-09-29 ENCOUNTER — Inpatient Hospital Stay: Payer: Self-pay

## 2022-09-29 ENCOUNTER — Inpatient Hospital Stay (HOSPITAL_COMMUNITY): Payer: Medicare HMO

## 2022-09-29 ENCOUNTER — Encounter (HOSPITAL_COMMUNITY): Payer: Self-pay | Admitting: Critical Care Medicine

## 2022-09-29 DIAGNOSIS — R001 Bradycardia, unspecified: Secondary | ICD-10-CM

## 2022-09-29 DIAGNOSIS — E8729 Other acidosis: Secondary | ICD-10-CM

## 2022-09-29 DIAGNOSIS — I959 Hypotension, unspecified: Secondary | ICD-10-CM

## 2022-09-29 DIAGNOSIS — I9589 Other hypotension: Secondary | ICD-10-CM | POA: Diagnosis present

## 2022-09-29 DIAGNOSIS — J189 Pneumonia, unspecified organism: Secondary | ICD-10-CM | POA: Diagnosis present

## 2022-09-29 DIAGNOSIS — G9341 Metabolic encephalopathy: Secondary | ICD-10-CM | POA: Diagnosis present

## 2022-09-29 DIAGNOSIS — J9811 Atelectasis: Secondary | ICD-10-CM | POA: Diagnosis not present

## 2022-09-29 DIAGNOSIS — M25562 Pain in left knee: Secondary | ICD-10-CM | POA: Diagnosis present

## 2022-09-29 DIAGNOSIS — Z6823 Body mass index (BMI) 23.0-23.9, adult: Secondary | ICD-10-CM | POA: Diagnosis not present

## 2022-09-29 DIAGNOSIS — R627 Adult failure to thrive: Secondary | ICD-10-CM | POA: Diagnosis present

## 2022-09-29 DIAGNOSIS — M549 Dorsalgia, unspecified: Secondary | ICD-10-CM | POA: Diagnosis present

## 2022-09-29 DIAGNOSIS — R578 Other shock: Secondary | ICD-10-CM | POA: Diagnosis not present

## 2022-09-29 DIAGNOSIS — E861 Hypovolemia: Secondary | ICD-10-CM | POA: Diagnosis not present

## 2022-09-29 DIAGNOSIS — E440 Moderate protein-calorie malnutrition: Secondary | ICD-10-CM | POA: Diagnosis present

## 2022-09-29 DIAGNOSIS — G8929 Other chronic pain: Secondary | ICD-10-CM | POA: Diagnosis present

## 2022-09-29 DIAGNOSIS — R569 Unspecified convulsions: Secondary | ICD-10-CM | POA: Diagnosis present

## 2022-09-29 DIAGNOSIS — L89150 Pressure ulcer of sacral region, unstageable: Secondary | ICD-10-CM | POA: Diagnosis present

## 2022-09-29 DIAGNOSIS — K589 Irritable bowel syndrome without diarrhea: Secondary | ICD-10-CM | POA: Diagnosis present

## 2022-09-29 DIAGNOSIS — F32A Depression, unspecified: Secondary | ICD-10-CM | POA: Diagnosis present

## 2022-09-29 DIAGNOSIS — A419 Sepsis, unspecified organism: Secondary | ICD-10-CM | POA: Diagnosis present

## 2022-09-29 DIAGNOSIS — F419 Anxiety disorder, unspecified: Secondary | ICD-10-CM | POA: Diagnosis present

## 2022-09-29 DIAGNOSIS — F13239 Sedative, hypnotic or anxiolytic dependence with withdrawal, unspecified: Secondary | ICD-10-CM | POA: Diagnosis present

## 2022-09-29 DIAGNOSIS — R9431 Abnormal electrocardiogram [ECG] [EKG]: Secondary | ICD-10-CM

## 2022-09-29 DIAGNOSIS — R609 Edema, unspecified: Secondary | ICD-10-CM | POA: Diagnosis not present

## 2022-09-29 DIAGNOSIS — E876 Hypokalemia: Secondary | ICD-10-CM | POA: Diagnosis present

## 2022-09-29 DIAGNOSIS — E874 Mixed disorder of acid-base balance: Secondary | ICD-10-CM | POA: Diagnosis present

## 2022-09-29 LAB — BASIC METABOLIC PANEL
Anion gap: 20 — ABNORMAL HIGH (ref 5–15)
Anion gap: 11 (ref 5–15)
BUN: 12 mg/dL (ref 8–23)
BUN: 11 mg/dL (ref 8–23)
CO2: 11 mmol/L — ABNORMAL LOW (ref 22–32)
CO2: 16 mmol/L — ABNORMAL LOW (ref 22–32)
Calcium: 8.6 mg/dL — ABNORMAL LOW (ref 8.9–10.3)
Calcium: 8.5 mg/dL — ABNORMAL LOW (ref 8.9–10.3)
Chloride: 105 mmol/L (ref 98–111)
Chloride: 106 mmol/L (ref 98–111)
Creatinine, Ser: 0.82 mg/dL (ref 0.44–1.00)
Creatinine, Ser: 0.97 mg/dL (ref 0.44–1.00)
GFR, Estimated: >60 mL/min (ref >60)
GFR, Estimated: >60 mL/min (ref >60)
Glucose, Bld: 135 mg/dL — ABNORMAL HIGH (ref 70–99)
Glucose, Bld: 192 mg/dL — ABNORMAL HIGH (ref 70–99)
Potassium: 3 mmol/L — ABNORMAL LOW (ref 3.5–5.1)
Potassium: 3.7 mmol/L (ref 3.5–5.1)
Sodium: 136 mmol/L (ref 135–145)
Sodium: 133 mmol/L — ABNORMAL LOW (ref 135–145)

## 2022-09-29 LAB — CBC
HCT: 40.4 % (ref 36.0–46.0)
HCT: 38.7 % (ref 36.0–46.0)
Hemoglobin: 12.3 g/dL (ref 12.0–15.0)
Hemoglobin: 12.5 g/dL (ref 12.0–15.0)
MCH: 27.5 pg (ref 26.0–34.0)
MCH: 28.5 pg (ref 26.0–34.0)
MCHC: 30.4 g/dL (ref 30.0–36.0)
MCHC: 32.3 g/dL (ref 30.0–36.0)
MCV: 90.2 fL (ref 80.0–100.0)
MCV: 88.2 fL (ref 80.0–100.0)
Platelets: 494 10*3/uL — ABNORMAL HIGH (ref 150–400)
Platelets: 502 10*3/uL — ABNORMAL HIGH (ref 150–400)
RBC: 4.48 MIL/uL (ref 3.87–5.11)
RBC: 4.39 MIL/uL (ref 3.87–5.11)
RDW: 14.4 % (ref 11.5–15.5)
RDW: 14.3 % (ref 11.5–15.5)
WBC: 12.3 10*3/uL — ABNORMAL HIGH (ref 4.0–10.5)
WBC: 13.4 10*3/uL — ABNORMAL HIGH (ref 4.0–10.5)
nRBC: 0 % (ref 0.0–0.2)
nRBC: 0 % (ref 0.0–0.2)

## 2022-09-29 LAB — RAPID URINE DRUG SCREEN, HOSP PERFORMED
Amphetamines: POSITIVE — AB
Barbiturates: POSITIVE — AB
Benzodiazepines: NOT DETECTED
Cocaine: NOT DETECTED
Opiates: NOT DETECTED
Tetrahydrocannabinol: NOT DETECTED

## 2022-09-29 LAB — URINALYSIS, ROUTINE W REFLEX MICROSCOPIC
Bilirubin Urine: NEGATIVE
Glucose, UA: NEGATIVE mg/dL
Hgb urine dipstick: NEGATIVE
Ketones, ur: 80 mg/dL — AB
Nitrite: NEGATIVE
Protein, ur: 30 mg/dL — AB
Specific Gravity, Urine: 1.026 (ref 1.005–1.030)
pH: 5 (ref 5.0–8.0)

## 2022-09-29 LAB — COMPREHENSIVE METABOLIC PANEL
ALT: 10 U/L (ref 0–44)
AST: 16 U/L (ref 15–41)
Albumin: 2.8 g/dL — ABNORMAL LOW (ref 3.5–5.0)
Alkaline Phosphatase: 144 U/L — ABNORMAL HIGH (ref 38–126)
Anion gap: 18 — ABNORMAL HIGH (ref 5–15)
BUN: 15 mg/dL (ref 8–23)
CO2: 14 mmol/L — ABNORMAL LOW (ref 22–32)
Calcium: 9 mg/dL (ref 8.9–10.3)
Chloride: 103 mmol/L (ref 98–111)
Creatinine, Ser: 1.04 mg/dL — ABNORMAL HIGH (ref 0.44–1.00)
GFR, Estimated: 59 mL/min — ABNORMAL LOW (ref >60)
Glucose, Bld: 81 mg/dL (ref 70–99)
Potassium: 2.8 mmol/L — ABNORMAL LOW (ref 3.5–5.1)
Sodium: 135 mmol/L (ref 135–145)
Total Bilirubin: 0.9 mg/dL (ref 0.3–1.2)
Total Protein: 5.8 g/dL — ABNORMAL LOW (ref 6.5–8.1)

## 2022-09-29 LAB — ACETAMINOPHEN LEVEL: Acetaminophen (Tylenol), Serum: 14 ug/mL (ref 10–30)

## 2022-09-29 LAB — CK: Total CK: 30 U/L — ABNORMAL LOW (ref 38–234)

## 2022-09-29 LAB — MAGNESIUM: Magnesium: 1.6 mg/dL — ABNORMAL LOW (ref 1.7–2.4)

## 2022-09-29 LAB — SALICYLATE LEVEL: Salicylate Lvl: <7 mg/dL — ABNORMAL LOW (ref 7.0–30.0)

## 2022-09-29 LAB — HEMOGLOBIN A1C
Hgb A1c MFr Bld: 5 % (ref 4.8–5.6)
Mean Plasma Glucose: 96.8 mg/dL

## 2022-09-29 LAB — PHOSPHORUS: Phosphorus: 2.4 mg/dL — ABNORMAL LOW (ref 2.5–4.6)

## 2022-09-29 LAB — PROCALCITONIN: Procalcitonin: 0.27 ng/mL

## 2022-09-29 LAB — ECHOCARDIOGRAM LIMITED
Area-P 1/2: 2.79 cm2
Height: 65 in
S' Lateral: 2.1 cm
Weight: 1834.23 oz

## 2022-09-29 LAB — GLUCOSE, CAPILLARY
Glucose-Capillary: 120 mg/dL — ABNORMAL HIGH (ref 70–99)
Glucose-Capillary: 157 mg/dL — ABNORMAL HIGH (ref 70–99)
Glucose-Capillary: 231 mg/dL — ABNORMAL HIGH (ref 70–99)

## 2022-09-29 LAB — MRSA NEXT GEN BY PCR, NASAL: MRSA by PCR Next Gen: NOT DETECTED

## 2022-09-29 LAB — BETA-HYDROXYBUTYRIC ACID: Beta-Hydroxybutyric Acid: 5.66 mmol/L — ABNORMAL HIGH (ref 0.05–0.27)

## 2022-09-29 LAB — LIPASE, BLOOD: Lipase: 24 U/L (ref 11–51)

## 2022-09-29 LAB — CBG MONITORING, ED
Glucose-Capillary: 78 mg/dL (ref 70–99)
Glucose-Capillary: 124 mg/dL — ABNORMAL HIGH (ref 70–99)

## 2022-09-29 LAB — LACTIC ACID, PLASMA: Lactic Acid, Venous: 1.1 mmol/L (ref 0.5–1.9)

## 2022-09-29 LAB — ETHANOL: Alcohol, Ethyl (B): <10 mg/dL (ref <10)

## 2022-09-29 LAB — URIC ACID: Uric Acid, Serum: 8.3 mg/dL — ABNORMAL HIGH (ref 2.5–7.1)

## 2022-09-29 LAB — TSH: TSH: 5.661 u[IU]/mL — ABNORMAL HIGH (ref 0.350–4.500)

## 2022-09-29 LAB — T4, FREE: Free T4: 1.6 ng/dL — ABNORMAL HIGH (ref 0.61–1.12)

## 2022-09-29 LAB — TROPONIN I (HIGH SENSITIVITY): Troponin I (High Sensitivity): 18 ng/L — ABNORMAL HIGH (ref <18)

## 2022-09-29 MED ORDER — SODIUM CHLORIDE 0.9 % IV SOLN
2.0000 g | INTRAVENOUS | Status: AC
  Administered 2022-09-30 – 2022-10-03 (×4): 2 g via INTRAVENOUS
  Filled 2022-09-29 (×5): qty 20

## 2022-09-29 MED ORDER — DEXTROSE 50 % IV SOLN
INTRAVENOUS | Status: AC
  Administered 2022-09-29: 50 mL
  Filled 2022-09-29: qty 50

## 2022-09-29 MED ORDER — PANTOPRAZOLE SODIUM 40 MG PO TBEC
40.0000 mg | DELAYED_RELEASE_TABLET | Freq: Every day | ORAL | Status: DC
  Administered 2022-09-29 – 2022-10-04 (×6): 40 mg via ORAL
  Filled 2022-09-29 (×6): qty 1

## 2022-09-29 MED ORDER — TRIMETHOBENZAMIDE HCL 100 MG/ML IM SOLN
200.0000 mg | Freq: Once | INTRAMUSCULAR | Status: DC | PRN
  Filled 2022-09-29: qty 2

## 2022-09-29 MED ORDER — POLYETHYLENE GLYCOL 3350 17 G PO PACK
17.0000 g | PACK | Freq: Every day | ORAL | Status: DC | PRN
  Administered 2022-10-01: 17 g via ORAL
  Filled 2022-09-29: qty 1

## 2022-09-29 MED ORDER — ADULT MULTIVITAMIN W/MINERALS CH
1.0000 | ORAL_TABLET | Freq: Every day | ORAL | Status: DC
  Administered 2022-09-29 – 2022-09-30 (×2): 1 via ORAL
  Filled 2022-09-29 (×2): qty 1

## 2022-09-29 MED ORDER — ONDANSETRON HCL 4 MG PO TABS
4.0000 mg | ORAL_TABLET | Freq: Three times a day (TID) | ORAL | Status: DC | PRN
  Administered 2022-09-30 – 2022-10-03 (×4): 4 mg via ORAL
  Filled 2022-09-29 (×5): qty 1

## 2022-09-29 MED ORDER — LIDOCAINE 5 % EX PTCH
1.0000 | MEDICATED_PATCH | CUTANEOUS | Status: DC
  Administered 2022-09-29 – 2022-10-04 (×6): 1 via TRANSDERMAL
  Filled 2022-09-29 (×6): qty 1

## 2022-09-29 MED ORDER — ACETAMINOPHEN 500 MG PO TABS
1000.0000 mg | ORAL_TABLET | Freq: Once | ORAL | Status: AC
  Administered 2022-09-29: 1000 mg via ORAL
  Filled 2022-09-29: qty 2

## 2022-09-29 MED ORDER — EPINEPHRINE HCL 5 MG/250ML IV SOLN IN NS
0.5000 ug/min | INTRAVENOUS | Status: DC
  Administered 2022-09-29: 0.5 ug/min via INTRAVENOUS
  Filled 2022-09-29: qty 250

## 2022-09-29 MED ORDER — INSULIN ASPART 100 UNIT/ML IJ SOLN
0.0000 [IU] | Freq: Three times a day (TID) | INTRAMUSCULAR | Status: DC
  Administered 2022-09-29 – 2022-10-01 (×4): 1 [IU] via SUBCUTANEOUS

## 2022-09-29 MED ORDER — MAGNESIUM SULFATE 4 GM/100ML IV SOLN
4.0000 g | Freq: Once | INTRAVENOUS | Status: AC
  Administered 2022-09-29: 4 g via INTRAVENOUS
  Filled 2022-09-29: qty 100

## 2022-09-29 MED ORDER — BOOST / RESOURCE BREEZE PO LIQD CUSTOM
1.0000 | Freq: Three times a day (TID) | ORAL | Status: DC
  Administered 2022-09-29 – 2022-10-02 (×3): 1 via ORAL

## 2022-09-29 MED ORDER — SODIUM CHLORIDE 0.9 % IV BOLUS
1000.0000 mL | Freq: Once | INTRAVENOUS | Status: AC
  Administered 2022-09-29: 1000 mL via INTRAVENOUS

## 2022-09-29 MED ORDER — LACTATED RINGERS IV BOLUS
1000.0000 mL | Freq: Once | INTRAVENOUS | Status: AC
  Administered 2022-09-29: 1000 mL via INTRAVENOUS

## 2022-09-29 MED ORDER — ACETAMINOPHEN 500 MG PO TABS
1000.0000 mg | ORAL_TABLET | Freq: Two times a day (BID) | ORAL | Status: DC
  Administered 2022-09-29 – 2022-10-04 (×9): 1000 mg via ORAL
  Filled 2022-09-29 (×11): qty 2

## 2022-09-29 MED ORDER — POTASSIUM & SODIUM PHOSPHATES 280-160-250 MG PO PACK
1.0000 | PACK | Freq: Three times a day (TID) | ORAL | Status: AC
  Administered 2022-09-29 (×3): 1 via ORAL
  Filled 2022-09-29 (×3): qty 1

## 2022-09-29 MED ORDER — SODIUM CHLORIDE 0.9 % IV SOLN
100.0000 mg | Freq: Two times a day (BID) | INTRAVENOUS | Status: DC
  Administered 2022-09-29 – 2022-09-30 (×3): 100 mg via INTRAVENOUS
  Filled 2022-09-29 (×3): qty 100

## 2022-09-29 MED ORDER — SODIUM CHLORIDE 0.9 % IV SOLN: 100.0000 mg | Freq: Two times a day (BID) | INTRAVENOUS | Status: DC

## 2022-09-29 MED ORDER — THIAMINE MONONITRATE 100 MG PO TABS
100.0000 mg | ORAL_TABLET | Freq: Every day | ORAL | Status: DC
  Administered 2022-09-29 – 2022-10-04 (×6): 100 mg via ORAL
  Filled 2022-09-29 (×6): qty 1

## 2022-09-29 MED ORDER — HEPARIN SODIUM (PORCINE) 5000 UNIT/ML IJ SOLN
5000.0000 [IU] | Freq: Three times a day (TID) | INTRAMUSCULAR | Status: DC
  Administered 2022-09-29 – 2022-10-04 (×15): 5000 [IU] via SUBCUTANEOUS
  Filled 2022-09-29 (×15): qty 1

## 2022-09-29 MED ORDER — NOREPINEPHRINE 4 MG/250ML-% IV SOLN
2.0000 ug/min | INTRAVENOUS | Status: DC
  Administered 2022-09-29: 2 ug/min via INTRAVENOUS

## 2022-09-29 MED ORDER — ATROPINE SULFATE 1 MG/10ML IJ SOSY
0.5000 mg | PREFILLED_SYRINGE | Freq: Once | INTRAMUSCULAR | Status: AC
  Administered 2022-09-29: 0.5 mg via INTRAVENOUS
  Filled 2022-09-29: qty 10

## 2022-09-29 MED ORDER — LACTATED RINGERS IV SOLN: INTRAVENOUS | Status: DC

## 2022-09-29 MED ORDER — NOREPINEPHRINE 4 MG/250ML-% IV SOLN
0.0000 ug/min | INTRAVENOUS | Status: DC
  Administered 2022-09-29 (×2): 2 ug/min via INTRAVENOUS
  Filled 2022-09-29 (×2): qty 250

## 2022-09-29 MED ORDER — HYDROCORTISONE SOD SUC (PF) 100 MG IJ SOLR
100.0000 mg | Freq: Two times a day (BID) | INTRAMUSCULAR | Status: DC
  Administered 2022-09-29 – 2022-09-30 (×3): 100 mg via INTRAVENOUS
  Filled 2022-09-29 (×3): qty 2

## 2022-09-29 MED ORDER — DOCUSATE SODIUM 100 MG PO CAPS
100.0000 mg | ORAL_CAPSULE | Freq: Two times a day (BID) | ORAL | Status: DC | PRN
  Administered 2022-09-30 – 2022-10-03 (×2): 100 mg via ORAL
  Filled 2022-09-29 (×2): qty 1

## 2022-09-29 MED ORDER — NOREPINEPHRINE 4 MG/250ML-% IV SOLN
2.0000 ug/min | INTRAVENOUS | Status: DC
  Filled 2022-09-29: qty 250

## 2022-09-29 MED ORDER — CHLORHEXIDINE GLUCONATE CLOTH 2 % EX PADS
6.0000 | MEDICATED_PAD | Freq: Every day | CUTANEOUS | Status: DC
  Administered 2022-09-29 – 2022-09-30 (×2): 6 via TOPICAL

## 2022-09-29 MED ORDER — FENTANYL CITRATE PF 50 MCG/ML IJ SOSY: 25.0000 ug | PREFILLED_SYRINGE | Freq: Once | INTRAMUSCULAR | Status: DC

## 2022-09-29 MED ORDER — ALPRAZOLAM 0.5 MG PO TABS
0.5000 mg | ORAL_TABLET | Freq: Once | ORAL | Status: AC
  Administered 2022-09-30: 0.5 mg via ORAL
  Filled 2022-09-29: qty 1

## 2022-09-29 MED ORDER — DICLOFENAC SODIUM 1 % EX GEL
2.0000 g | Freq: Four times a day (QID) | CUTANEOUS | Status: DC
  Administered 2022-09-29 – 2022-10-04 (×11): 2 g via TOPICAL
  Filled 2022-09-29: qty 100

## 2022-09-29 MED ORDER — SODIUM CHLORIDE 0.9 % IV SOLN
250.0000 mL | INTRAVENOUS | Status: DC
  Administered 2022-09-29: 250 mL via INTRAVENOUS

## 2022-09-29 MED ORDER — SODIUM CHLORIDE 0.9 % IV SOLN: 250.0000 mL | INTRAVENOUS | Status: DC

## 2022-09-29 MED ORDER — OXYCODONE HCL 5 MG PO TABS
10.0000 mg | ORAL_TABLET | Freq: Four times a day (QID) | ORAL | Status: DC | PRN
  Administered 2022-09-29 – 2022-10-04 (×18): 10 mg via ORAL
  Filled 2022-09-29 (×20): qty 2

## 2022-09-29 MED ORDER — ATROPINE SULFATE 1 MG/10ML IJ SOSY
1.0000 mg | PREFILLED_SYRINGE | Freq: Once | INTRAMUSCULAR | Status: AC
  Administered 2022-09-29: 1 mg via INTRAVENOUS
  Filled 2022-09-29: qty 10

## 2022-09-29 MED ORDER — POTASSIUM CHLORIDE 20 MEQ PO PACK
40.0000 meq | PACK | Freq: Two times a day (BID) | ORAL | Status: DC
  Administered 2022-09-29 – 2022-09-30 (×3): 40 meq via ORAL
  Filled 2022-09-29 (×3): qty 2

## 2022-09-29 MED ORDER — BUTALBITAL-APAP-CAFFEINE 50-325-40 MG PO TABS
1.0000 | ORAL_TABLET | Freq: Two times a day (BID) | ORAL | Status: DC | PRN
  Administered 2022-09-29 – 2022-09-30 (×3): 2 via ORAL
  Administered 2022-09-30: 1 via ORAL
  Administered 2022-10-01: 2 via ORAL
  Administered 2022-10-02 – 2022-10-03 (×3): 1 via ORAL
  Administered 2022-10-03 – 2022-10-04 (×2): 2 via ORAL
  Filled 2022-09-29 (×3): qty 2
  Filled 2022-09-29 (×2): qty 1
  Filled 2022-09-29 (×3): qty 2
  Filled 2022-09-29 (×2): qty 1
  Filled 2022-09-29: qty 2

## 2022-09-29 MED ORDER — GERHARDT'S BUTT CREAM
TOPICAL_CREAM | Freq: Two times a day (BID) | CUTANEOUS | Status: DC
  Administered 2022-09-30 – 2022-10-04 (×2): 1 via TOPICAL
  Filled 2022-09-29 (×3): qty 1

## 2022-09-29 MED ORDER — METHYLPREDNISOLONE SODIUM SUCC 125 MG IJ SOLR
125.0000 mg | Freq: Once | INTRAMUSCULAR | Status: AC
  Administered 2022-09-29: 125 mg via INTRAVENOUS
  Filled 2022-09-29: qty 2

## 2022-09-29 MED ORDER — SODIUM CHLORIDE 0.9 % IV SOLN
2.0000 g | Freq: Once | INTRAVENOUS | Status: AC
  Administered 2022-09-29: 2 g via INTRAVENOUS
  Filled 2022-09-29: qty 20

## 2022-09-29 MED ORDER — POTASSIUM CHLORIDE 10 MEQ/100ML IV SOLN
10.0000 meq | INTRAVENOUS | Status: AC
  Administered 2022-09-29 (×3): 10 meq via INTRAVENOUS
  Filled 2022-09-29 (×4): qty 100

## 2022-09-29 MED ORDER — POTASSIUM CHLORIDE CRYS ER 20 MEQ PO TBCR
40.0000 meq | EXTENDED_RELEASE_TABLET | Freq: Once | ORAL | Status: AC
  Administered 2022-09-29: 40 meq via ORAL
  Filled 2022-09-29: qty 2

## 2022-09-29 NOTE — ED Notes (Signed)
Per ICU MD dc Levo and switch to Epi

## 2022-09-29 NOTE — Progress Notes (Signed)
[facility] Progress Note Patient Name: Jamie [name] DOB: 1956/02/01 MRN: [id number]   Date of Service  09/29/2022  HPI/Events of Note  Patient requests sleep aid and states that Melatonin does not work. Takes Xanax at home.   eICU Interventions  Plan: Xanax 0.5 mg PO X 1.      Intervention Category Major Interventions: Other:  Lysle Dingwall 09/29/2022, 11:58 PM

## 2022-09-29 NOTE — Consult Note (Signed)
[place] Nurse Consult Note: Patient receiving care in [facility] 2M06. Reason for Consult: bilateral buttock/low back moisture related wounds Wound type: Patient was incontinent for some time with urine and stool, and unable to move. No doubt the injuries represented in the photo are due to contact dermatitis from urine and feces; and, there is a pressure related component. Pressure Injury POA: Yes Measurement: see photo Wound bed: see photo Drainage (amount, consistency, odor) to be determined Periwound: intact Dressing procedure/placement/frequency: I see there is already an order for twice daily Gerhardt's butt cream, which is helpful in this type of skin injury.  The patient is also on an ICU low air loss surface which will help with moisture and pressure reduction.  For now, there is nothing additional to do topically other than to keep the areas as clean as possible, and turn and reposition the patient.  I do expect that the areas negatively impacted by pressure will continue to demarcate.  I have placed the patient on the [facility] f/u list for re-evaluation next week to evaluate for further worsening of the areas.  Monitor the wound area(s) for worsening of condition such as: Signs/symptoms of infection,  Increase in size,  Development of or worsening of odor, Development of pain, or increased pain at the affected locations.  Notify the medical team if any of these develop. Val Riles, RN, MSN, CWOCN, CNS-BC, pager [phone]

## 2022-09-29 NOTE — ED Notes (Signed)
Lab to add on TSH  

## 2022-09-29 NOTE — ED Notes (Signed)
IV Team at bedside 

## 2022-09-29 NOTE — Progress Notes (Signed)
Initial Nutrition Assessment  DOCUMENTATION CODES:   Non-severe (moderate) malnutrition in context of social or environmental circumstances  INTERVENTION:  Monitor magnesium, potassium, and phosphorus BID for at least 3 days, MD to replete as needed, as pt is at risk for refeeding syndrome Boost Breeze po TID, each supplement provides 250 kcal and 9 grams of protein Carnation Instant Breakfast po TID - each supplement provides 140 kcal and 5g of protein  MVI with minerals daily Check micronutrient labs: Vitamin A, Vitamin C, Vitamin D, zinc, copper   NUTRITION DIAGNOSIS:   Moderate Malnutrition related to social / environmental circumstances as evidenced by moderate fat depletion, severe muscle depletion, moderate muscle depletion.  GOAL:   Patient will meet greater than or equal to 90% of their needs  MONITOR:   PO intake, Supplement acceptance, Labs, Weight trends, Skin  REASON FOR ASSESSMENT:   Rounds    ASSESSMENT:   Pt admitted with bradycardia. PMH significant for anxiety, chronic hypotension.  Pt reports feeling unwell at time of visit. She endorses some nausea, however had just received pain medications and has not eaten much per RN. Pt endorses inability to eat within the last 3 weeks d/t IBS. She had been experiencing nausea, vomiting and diarrhea within this time frame however reports improvement of vomiting and diarrhea. Per MD documentation, pt has been unable to get off the couch or use the bathroom.  She states that lunch was the first meal she has had in 3 weeks. She did not eat much and states that she believes she received a ham sandwich but she does not eat pork. Pt reports having an almond allergy, however she typically drinks almond milk.   She would benefit from the addition of nutrition supplements but states that she does not tolerate Ensure well. She denies having lactose intolerance. She is agreeable to trying Boost Breeze and [employer].   Pt endorses weight loss stating that her usual weight is ~125 lbs. Reviewed weight history. Pt's weight appears to have remained steady at 54.4 kg until 05/31. Since then she has had a weight loss of 4.4% which is not clinically significant for time frame.  Medications: solu-cortef, protonix, phos-nak, klor-con, thiamine IV drips: abx, LR @100ml /hr, Mg sulfate  Labs: potassium 3.0, phos 2.4, Mg 1.6, alkaline phosphatase 144, CBG's 78-124 x24 hours  NUTRITION - FOCUSED PHYSICAL EXAM:  Flowsheet Row Most Recent Value  Orbital Region Moderate depletion  Upper Arm Region Moderate depletion  Thoracic and Lumbar Region Moderate depletion  Buccal Region Moderate depletion  Temple Region Severe depletion  Clavicle Bone Region Severe depletion  Clavicle and Acromion Bone Region Moderate depletion  Scapular Bone Region Moderate depletion  Dorsal Hand No depletion  Patellar Region Moderate depletion  Anterior Thigh Region Moderate depletion  Posterior Calf Region Moderate depletion  Edema (RD Assessment) None  Hair Reviewed  Eyes Reviewed  Mouth Other (Comment)  [appears to have geographic tongue,  poor dentition]  Skin Reviewed  Nails Reviewed      Diet Order:   Diet Order             Diet regular Room service appropriate? Yes with Assist; Fluid consistency: Thin  Diet effective now                  EDUCATION NEEDS:   Education needs have been addressed  Skin:  Skin Assessment: Skin Integrity Issues: Skin Integrity Issues:: Unstageable Unstageable: sacrum  Last BM:  PTA  Height:  Ht Readings from Last 1 Encounters:  09/29/22 5\' 5"  (1.651 m)    Weight:   Wt Readings from Last 1 Encounters:  09/29/22 52 kg   BMI:  Body mass index is 19.08 kg/m.  Estimated Nutritional Needs:   Kcal:  1600-1800  Protein:  80-95g  Fluid:  >/=1.6L  11/29/22, RDN, LDN Clinical Nutrition

## 2022-09-29 NOTE — Progress Notes (Signed)
[name], MRN:  [id number], DOB:  07/15/1956, LOS: 0 ADMISSION DATE:  09/28/2022, CONSULTATION DATE:  09/29/2022 REFERRING MD: EDP, CHIEF COMPLAINT: Bradycardia  History of Present Illness:  This is a 66 year old female presented to the emergency room with weakness and fatigue.  Patient reports having 3 weeks of nausea, vomiting, and diarrhea leading to progressive weakness.  Patient was found to have low heart rate into the 30s.  Patient also was hypotensive into the low 70s.  Patient also had hypokalemia.  Patient admitted to ICU for further work-up and treatment of symptomatic bradycardia with hypotension.  Pertinent  Medical History  Hypotension, chronic pain, anxiety, IBS  Significant Hospital Events: Including procedures, antibiotic start and stop dates in addition to other pertinent events   10/5: Admit to ICU  Interim History / Subjective:  Patient is resting in bed upon my exam.  Patient is currently complaining of a migraine as well as left knee pain.  She also reports having back pain.  She states that she has not taken her oxycodone in 3 weeks.  Patient reports some nausea, but she states that she has not vomited.  Patient states no other concerns at this time.  Of note, patient does have a sacral wound, which was present on admission.  Objective   Blood pressure 133/62, pulse 61, temperature (!) 97.5 F (36.4 C), temperature source Axillary, resp. rate (!) 21, height 5\' 5"  (1.651 m), weight 52 kg, SpO2 100 %.        Intake/Output Summary (Last 24 hours) at 09/29/2022 1319 Last data filed at 09/29/2022 1000 Gross per 24 hour  Intake 3717.38 ml  Output --  Net 3717.38 ml   Filed Weights   09/29/22 0001  Weight: 52 kg    Examination:  General: Patient is resting comfortably in bed in no acute distress, ill appearing  Eyes: Pupils equal and reactive to light Head: Normocephalic, atraumatic  Cardio: Regular rate and rhythm, Grade III/VI systolic murmur heard  to left lower sternal border Pulmonary: Clear to ausculation bilaterally with no rales, rhonchi, and crackles  Abdomen: Soft, nontender Neuro: Alert and orientated x3 Skin: Wound noted to lower sacral region which was present on admission. No signs of infection   MSK: 4/5 strength to upper and lower extremities.   Resolved Hospital Problem list     Assessment & Plan:  This is a 66 year old female presented with concerns of fatigue and weakness.  Patient found to have heart rate into the 30s.  Patient also found to have hypokalemia.  Patient mated for further work-up of symptomatic bradycardia and hypokalemia.  #Symptomatic bradycardia Patient initial heart rate into the 30s.  Patient is status post some atropine, with partial response.  Patient required Levophed and epinephrine.  Patient's heart rate got into the 90s this AM.  Patient is now weaned off of Levophed and epinephrine.  Continue to monitor.  This is likely in the setting of electrolyte abnormalities.  I do not think patient has underlying cardiomyopathy.  I do not think the patient has any underlying thyroid etiology contributing. -Goal heart rate of 60 -Continue to monitor heart rate -Replete electrolytes -ECHO pending  #Hypotension Patient's initial blood pressures into the 70s.  Patient is status post fluid from the emergency department.  Patient did require Levophed and epinephrine.  Patient's blood pressure increased into the 140s.  Patient has been weaned off of Levophed and epinephrine with most recent blood pressure at 102. -This could be in  the setting of hypovolemia, as well as bradycardia -Continue to monitor blood pressure -Hold any antihypertensives at this point -No need for any pressors at this moment -Goal is systolic greater than 90  #Left pneumonia #Leukocytosis, secondary to above Patient did initially present with chest x-ray showing left lung infiltrate.  Patient started on ceftriaxone and doxycycline.   Patient is not endorsing a cough.  Pro-Cal 0.27.  We will continue antibiotics, and repeat chest x-ray tomorrow to see if pneumonia blossomed. -Continue with ceftriaxone and doxycycline -Repeat chest x-ray in tomorrow a.m -Monitor white count -Monitor fever curve   #Hypokalemia #Hypomagnesemia #Hypophosphatemia Potassium 2.8, magnesium 1.6, phosphorus 2.4.  Will replete -This could likely be contributing to bradycardia, will replete and repeat.  #Decubitus ulcer -Patient does have wound to lower back.  This is tiny, with some eschar around it.  Wound care following. -Continue wound care  Best Practice (right click and "Reselect all SmartList Selections" daily)   Diet/type: Regular consistency (see orders) DVT prophylaxis: LMWH GI prophylaxis: PPI Lines: N/A Foley:  N/A Code Status:  full code  Labs   CBC: Recent Labs  Lab 09/29/22 0024 09/29/22 0422  WBC 12.3* 13.4*  HGB 12.3 12.5  HCT 40.4 38.7  MCV 90.2 88.2  PLT 494* 502*    Basic Metabolic Panel: Recent Labs  Lab 09/29/22 0024 09/29/22 0422  NA 135 136  K 2.8* 3.0*  CL 103 105  CO2 14* 11*  GLUCOSE 81 135*  BUN 15 12  CREATININE 1.04* 0.82  CALCIUM 9.0 8.6*  MG  --  1.6*  PHOS  --  2.4*   GFR: Estimated Creatinine Clearance: 53.9 mL/min (by C-G formula based on SCr of 0.82 mg/dL). Recent Labs  Lab 09/29/22 0024 09/29/22 0422  PROCALCITON  --  0.27  WBC 12.3* 13.4*  LATICACIDVEN 1.1  --     Liver Function Tests: Recent Labs  Lab 09/29/22 0024  AST 16  ALT 10  ALKPHOS 144*  BILITOT 0.9  PROT 5.8*  ALBUMIN 2.8*   Recent Labs  Lab 09/29/22 0024  LIPASE 24   No results for input(s): "AMMONIA" in the last 168 hours.  ABG No results found for: "PHART", "PCO2ART", "PO2ART", "HCO3", "TCO2", "ACIDBASEDEF", "O2SAT"   Coagulation Profile: No results for input(s): "INR", "PROTIME" in the last 168 hours.  Cardiac Enzymes: Recent Labs  Lab 09/29/22 0024  CKTOTAL 30*    HbA1C: No  results found for: "HGBA1C"  CBG: Recent Labs  Lab 09/29/22 0113 09/29/22 0354 09/29/22 0605  GLUCAP 78 124* 120*    Review of Systems:   Negative except stated in HPI  Past Medical History:  She,  has a past medical history of Anxiety, Arthritis, Chronic pain, Gastroenteritis, Headache, Hypotension, Insomnia, and Suicidal ideation.   Surgical History:   Past Surgical History:  Procedure Laterality Date   BACK SURGERY     Lumbar Fusion   CARPAL TUNNEL RELEASE     LAPAROSCOPIC OOPHERECTOMY     MOUTH SURGERY       Social History:   reports that she has never smoked. She has never used smokeless tobacco. She reports that she does not currently use alcohol. She reports that she does not use drugs.   Family History:  Her family history is negative for Colon cancer.   Allergies Allergies  Allergen Reactions   Amoxicillin-Pot Clavulanate Nausea And Vomiting    Can tolerate plain Amoxicillin   Azithromycin Nausea And Vomiting    Other reaction(s): Other (  See Comments) unknown   Bupropion Nausea And Vomiting   Nsaids Other (See Comments)    Stomach problems   Prednisone Diarrhea   Vilazodone Diarrhea     Home Medications  Prior to Admission medications   Medication Sig Start Date End Date Taking? Authorizing Provider  acetaminophen (TYLENOL) 500 MG tablet Take 2 tablets (1,000 mg total) by mouth every 8 (eight) hours as needed for mild pain or headache (or Fever >/= 101). 07/30/22   Vassie Loll, MD  ALPRAZolam Prudy Feeler) 1 MG tablet Take 1 tablet (1 mg total) by mouth 3 (three) times daily as needed for anxiety or sleep. 05/17/22   Mancel Bale, MD  butalbital-acetaminophen-caffeine (FIORICET) [phone] MG tablet Take 1-2 tablets by mouth 2 (two) times daily as needed for headache. 05/17/22   Mancel Bale, MD  loperamide (IMODIUM) 2 MG capsule Take 2 mg by mouth every 6 (six) hours as needed. 05/09/22   [provider]  loratadine (CLARITIN) 10 MG tablet Take 1  tablet (10 mg total) by mouth daily. 07/31/22   Vassie Loll, MD  pantoprazole (PROTONIX) 40 MG tablet Take 1 tablet (40 mg total) by mouth daily. 07/30/22   Vassie Loll, MD  traZODone (DESYREL) 100 MG tablet Take 1 tablet (100 mg total) by mouth at bedtime as needed for sleep. 07/30/22   Vassie Loll, MD     Critical care time: 42    Modena Slater, DO Internal Medicine Resident PGY-1 Pager: [phone]

## 2022-09-29 NOTE — Progress Notes (Signed)
[place] Progress Note Patient Name: Jamie [name] DOB: 1956-07-25 MRN: [id number]   Date of Service  09/29/2022  HPI/Events of Note  Hypotension - BP = 77/52 with MAP = 61.   eICU Interventions  Plan: Norepinephrine IV infusion via PIV. Titrate to MAP >= 65 and SBP > 90. Monitor CVP now and Q 4 hours.      Intervention Category Major Interventions: Hypotension - evaluation and management  Wandell Scullion Eugene 09/29/2022, 8:14 PM

## 2022-09-29 NOTE — ED Notes (Signed)
Pt taken to CT with this RN.

## 2022-09-29 NOTE — Progress Notes (Signed)
An USGPIV (ultrasound guided PIV) has been placed for short-term vasopressor infusion. A correctly placed ivWatch must be used when administering Vasopressors. Should this treatment be needed beyond 72 hours, central line access should be obtained.  It will be the responsibility of the bedside nurse to follow best practice to prevent extravasations.   ?

## 2022-09-29 NOTE — Progress Notes (Signed)
  Echocardiogram 2D Echocardiogram has been performed.  Jamie [name] M 09/29/2022, 10:17 AM

## 2022-09-29 NOTE — ED Notes (Signed)
MD aware of pt cbg - d50 to be given

## 2022-09-29 NOTE — H&P (Signed)
[name], MRN:  [id number], DOB:  1956-08-27, LOS: 0 ADMISSION DATE:  09/28/2022, CONSULTATION DATE:  10/5 REFERRING MD:  Clovis Pu, CHIEF COMPLAINT:  bradycardia   History of Present Illness:  Ms. Jamie [name] is a 66 y/o woman with a history of anxiety, chronic hypotension who presented with EMS after having 3 weeks of nausea, vomiting, and diarrhea leading to progressive weakness. 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 She called EMS tonight due to not being able to get off the couch. She has not been able to get up to use the bathroom and has pain on her buttocks and back from lying in urine. She denies new medications and reports she has been taking her meds- tizanidine, butalbital, trazodone. She has not eaten in 3 weeks due to not being able to get off the couch.  With EMS and in the ED she was bradycardic in the 30s. She was given atropine with a partial response. She was started on norepi in the ED with HR improvement to the 40-50s and improvement in her BP. She was empirically started on ceftriaxone due to concern for sepsis due to pneumonia.   She has had a similar presentation in Aug 2023 that cardiology felt was attributable to overuse of home pain and anxiety medications. At that time she had hypovolemic shock, lactic acidosis as well.   Pertinent  Medical History  Chronic hypotension anxiety  Significant Hospital Events: Including procedures, antibiotic start and stop dates in addition to other pertinent events   10/5 admitted, started on ceftriaxone, doxy  Interim History / Subjective:    Objective   Blood pressure 137/61, pulse (!) 48, temperature (!) 97.3 F (36.3 C), temperature source Oral, resp. rate 13, height 5\' 5"  (1.651 m), weight 52 kg, SpO2 95 %.        Intake/Output Summary (Last 24 hours) at 09/29/2022 0439 Last data filed at 09/29/2022 0301 Gross per 24 hour  Intake 1004.75 ml  Output --  Net 1004.75 ml   Filed Weights   09/29/22 0001   Weight: 52 kg    Examination: General: chronically ill appearing woman lying in bed in NAD HENT: [place]/AT, eyes anicteric. Purulent conjunctival discharge bilaterally, but no conjunctival injection. Lungs: CTAB, breathing comfortably on RA. Cardiovascular: S1S2, bradycardic Abdomen: soft, NT Extremities: no significant edema Neuro: Awake, able to help reposition herself in bed but globally weak. Answering questions with mildly dysarthric speech. GU: amber urine with I/O cath Derm: extensive sloughing skin wounds on buttocks L>R, small scab on upper sacrum.  EKG- sinus brady, prolonged Qtc. No ST segment deviation or TWI.  K+ 2.8 Ca+ 9 BUN 15 Cr 1.04 WBC 12.3  CXR personally reviewed> L hemidiaphragm elevated, possible left basilar infiltrate   Resolved Hospital Problem list     Assessment & Plan:   Symptomatic bradycardia -not on home Bblocker or CCB. TSH is only minimally elevated, temp not significantly reduced.  Previously had bradycardia in previous admission- at that time felt to be due to PTA anxiety meds. Had a new script for 30 days (4x per day) 10mg  oxycodone tablets, plus 90 xanax dispensed on 08/27/22, 90 tizanidine filled 9/25.  -check troponin -con't steroids; no cortisol has been checked but at this point it will be increased due to solumedrol -change NE to epinephrine -try 1mg  atropine (dose previously helped in August) -check free T4 & free T3 to rule out central hypothyroidism -UDS pending -monitor on tele  Hypotension -volume resuscitation -epi for HR  Nausea, vomiting, diarrhea-- worry about potential opiate withdrawal syndrome vs cyclic vomiting syndrome, although she does not endorse THC use. -UDS pending  Acute metabolic encephalopathy -hold potentially sedating meds -monitor electrolytes  AGMA, negative LA -check B-OH-butyrate, uric acid -checking aspirin, tylenol levels  Possible LLL CAP Pleural effusion -ceftriaxone, adding doxycycline  for atypical coverage -check PCT  Hypokalemia -replete -check Mg+  Sacral & buttocks wounds -barrier cream -wound care consult  Overall failure to thrive -TOC consult; anticipate she will likely need placement after this hospitalization -PT, OT when HR improves  Best Practice (right click and "Reselect all SmartList Selections" daily)   Diet/type: NPO DVT prophylaxis: prophylactic heparin  GI prophylaxis: PPI Lines: N/A Foley:  N/A Code Status:  full code Last date of multidisciplinary goals of care discussion [ ]   Labs   CBC: Recent Labs  Lab 09/29/22 0024  WBC 12.3*  HGB 12.3  HCT 40.4  MCV 90.2  PLT 494*    Basic Metabolic Panel: Recent Labs  Lab 09/29/22 0024  NA 135  K 2.8*  CL 103  CO2 14*  GLUCOSE 81  BUN 15  CREATININE 1.04*  CALCIUM 9.0   GFR: Estimated Creatinine Clearance: 42.5 mL/min (A) (by C-G formula based on SCr of 1.04 mg/dL (H)). Recent Labs  Lab 09/29/22 0024  WBC 12.3*  LATICACIDVEN 1.1    Liver Function Tests: Recent Labs  Lab 09/29/22 0024  AST 16  ALT 10  ALKPHOS 144*  BILITOT 0.9  PROT 5.8*  ALBUMIN 2.8*   Recent Labs  Lab 09/29/22 0024  LIPASE 24   No results for input(s): "AMMONIA" in the last 168 hours.  ABG No results found for: "PHART", "PCO2ART", "PO2ART", "HCO3", "TCO2", "ACIDBASEDEF", "O2SAT"   Coagulation Profile: No results for input(s): "INR", "PROTIME" in the last 168 hours.  Cardiac Enzymes: Recent Labs  Lab 09/29/22 0024  CKTOTAL 30*    HbA1C: No results found for: "HGBA1C"  CBG: Recent Labs  Lab 09/29/22 0113 09/29/22 0354  GLUCAP 78 124*    Review of Systems:   Review of Systems  Constitutional:  Positive for malaise/fatigue.  HENT: Negative.    Respiratory:  Positive for cough and shortness of breath.   Cardiovascular: Negative.   Gastrointestinal:  Positive for diarrhea, nausea and vomiting.  Musculoskeletal:  Positive for back pain.  Skin:        wound   Neurological:  Positive for weakness.     Past Medical History:  She,  has a past medical history of Anxiety, Arthritis, Chronic pain, Gastroenteritis, Headache, Hypotension, Insomnia, and Suicidal ideation.   Surgical History:   Past Surgical History:  Procedure Laterality Date   BACK SURGERY     CARPAL TUNNEL RELEASE     LAPAROSCOPIC OOPHERECTOMY     MOUTH SURGERY       Social History:   reports that she has never smoked. She has never used smokeless tobacco. She reports that she does not currently use alcohol. She reports that she does not use drugs.   Family History:  Her family history is negative for Colon cancer.   Allergies Allergies  Allergen Reactions   Amoxicillin-Pot Clavulanate Nausea And Vomiting    Can tolerate plain Amoxicillin   Azithromycin Nausea And Vomiting    Other reaction(s): Other (See Comments) unknown   Bupropion Nausea And Vomiting   Nsaids Other (See Comments)    Stomach problems   Prednisone Diarrhea   Vilazodone Diarrhea     Home Medications  Prior to Admission medications   Medication Sig Start Date End Date Taking? Authorizing Provider  acetaminophen (TYLENOL) 500 MG tablet Take 2 tablets (1,000 mg total) by mouth every 8 (eight) hours as needed for mild pain or headache (or Fever >/= 101). 07/30/22   Vassie Loll, MD  ALPRAZolam Prudy Feeler) 1 MG tablet Take 1 tablet (1 mg total) by mouth 3 (three) times daily as needed for anxiety or sleep. 05/17/22   Mancel Bale, MD  butalbital-acetaminophen-caffeine (FIORICET) [phone] MG tablet Take 1-2 tablets by mouth 2 (two) times daily as needed for headache. 05/17/22   Mancel Bale, MD  loperamide (IMODIUM) 2 MG capsule Take 2 mg by mouth every 6 (six) hours as needed. 05/09/22   [provider]  loratadine (CLARITIN) 10 MG tablet Take 1 tablet (10 mg total) by mouth daily. 07/31/22   Vassie Loll, MD  pantoprazole (PROTONIX) 40 MG tablet Take 1 tablet (40 mg total) by mouth daily.  07/30/22   Vassie Loll, MD  traZODone (DESYREL) 100 MG tablet Take 1 tablet (100 mg total) by mouth at bedtime as needed for sleep. 07/30/22   Vassie Loll, MD     Critical care time: 45 min.      Steffanie Dunn, DO 09/29/22 4:41 AM [facility]

## 2022-09-29 NOTE — Progress Notes (Signed)
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 She is already asking for Alprazolam and Butalbital despite her altered mental status. Patient is also having recurrent nausea of unclear etiology.  eICU Interventions  New Patient Evaluation. Tigan 200 mg IM x 1.        Kerry Kass Carnelius Hammitt 09/29/2022, 6:32 AM

## 2022-09-29 NOTE — ED Notes (Signed)
Pt with breakdown on bottom. Pt rolled and cleaned up to best ability with NT - Barrier cream applied at this time

## 2022-09-29 NOTE — TOC Progression Note (Signed)
Transition of Care [facility]) - Progression Note    Patient Details  Name: ADMIRE BUNNELL MRN: [id number] Date of Birth: [birth year]  Transition of Care [facility]) CM/SW [place], RN Phone Number:[phone]  09/29/2022, 1:10 PM  Clinical Narrative:     Transition of Care [facility]) Screening Note   Patient Details  Name: TIERNEY BEHL Date of Birth: 24-Jan-1956   Transition of Care [facility]) CM/SW Contact:    Angelita Ingles, RN Phone Number: 09/29/2022, 1:10 PM    Transition of Care Department [facility]) has reviewed patient and no TOC needs have been identified at this time. We will continue to monitor patient advancement through interdisciplinary progression rounds.          Expected Discharge Plan and Services                                                 Social Determinants of Health (SDOH) Interventions    Readmission Risk Interventions    07/28/2022    1:55 PM  Readmission Risk Prevention Plan  Transportation Screening Complete  Medication Review ([place]) Complete  PCP or Specialist appointment within 3-5 days of discharge Not Complete  HRI or [place] Complete  SW Recovery Care/Counseling Consult Complete  [place] Not Applicable

## 2022-09-29 NOTE — ED Provider Notes (Signed)
[facility] DEPT [facility] Emergency Department Provider Note MRN:  [id number]  Arrival date & time: 09/29/22     Chief Complaint   Bradycardia and Weakness   History of Present Illness   Jamie [name] is a 66 y.o. year-old female with no pertinent past medical history presenting to the ED with chief complaint of weakness.  Persistent nausea and vomiting for the past few days, feeling very weak, unable to get around the house.  Found by EMS on the floor, covered in her own feces.  Patient denies headache, no vision change, no neck or back pain, no chest pain or shortness of breath, no abdominal pain, no numbness or weakness to the arms or legs.  Recent cough.  Pain to her buttocks.  Review of Systems  A thorough review of systems was obtained and all systems are negative except as noted in the HPI and PMH.   Patient's Health History    Past Medical History:  Diagnosis Date   Anxiety    Arthritis    Chronic pain    Gastroenteritis    Headache    Hypotension    Insomnia    Suicidal ideation     Past Surgical History:  Procedure Laterality Date   BACK SURGERY     CARPAL TUNNEL RELEASE     LAPAROSCOPIC OOPHERECTOMY     MOUTH SURGERY      Family History  Problem Relation Age of Onset   Colon cancer Neg Hx     Social History   Socioeconomic History   Marital status: Single    Spouse name: Not on file   Number of children: Not on file   Years of education: Not on file   Highest education level: Not on file  Occupational History   Not on file  Tobacco Use   Smoking status: Never   Smokeless tobacco: Never  Vaping Use   Vaping Use: Never used  Substance and Sexual Activity   Alcohol use: Not Currently   Drug use: Never   Sexual activity: Not on file  Other Topics Concern   Not on file  Social History Narrative   Not on file   Social Determinants of Health   Financial Resource Strain: Not on file  Food Insecurity: Not on file  Transportation Needs:  Not on file  Physical Activity: Not on file  Stress: Not on file  Social Connections: Not on file  Intimate Partner Violence: Not on file     Physical Exam   Vitals:   09/29/22 0330 09/29/22 0355  BP: 135/60   Pulse: (!) 50 (!) 51  Resp: 15 18  Temp:    SpO2: 99% 99%    CONSTITUTIONAL: Well-appearing, NAD NEURO/PSYCH:  Alert and oriented x 3, no focal deficits EYES:  eyes equal and reactive ENT/NECK:  no LAD, no JVD CARDIO: Regular rate, well-perfused, normal S1 and S2 PULM:  CTAB no wheezing or rhonchi GI/GU:  non-distended, non-tender MSK/SPINE:  No gross deformities, no edema SKIN: Skin breakdown to the buttocks   *Additional and/or pertinent findings included in MDM below  Diagnostic and Interventional Summary    EKG Interpretation  Date/Time:  Wednesday September 28 2022 23:51:18 EDT Ventricular Rate:  47 PR Interval:  171 QRS Duration: 103 QT Interval:  587 QTC Calculation: 520 R Axis:   79 Text Interpretation: Sinus bradycardia Atrial premature complex Nonspecific T abnormalities, diffuse leads Prolonged QT interval Confirmed by Kennis Carina [phone]) on 09/29/2022 12:18:43 AM  Labs Reviewed  CBC - Abnormal; Notable for the following components:      Result Value   WBC 12.3 (*)    Platelets 494 (*)    All other components within normal limits  COMPREHENSIVE METABOLIC PANEL - Abnormal; Notable for the following components:   Potassium 2.8 (*)    CO2 14 (*)    Creatinine, Ser 1.04 (*)    Total Protein 5.8 (*)    Albumin 2.8 (*)    Alkaline Phosphatase 144 (*)    GFR, Estimated 59 (*)    Anion gap 18 (*)    All other components within normal limits  CK - Abnormal; Notable for the following components:   Total CK 30 (*)    All other components within normal limits  TSH - Abnormal; Notable for the following components:   TSH 5.661 (*)    All other components within normal limits  CBG MONITORING, ED - Abnormal; Notable for the following  components:   Glucose-Capillary 124 (*)    All other components within normal limits  CULTURE, BLOOD (ROUTINE X 2)  CULTURE, BLOOD (ROUTINE X 2)  LIPASE, BLOOD  LACTIC ACID, PLASMA  ETHANOL  URINALYSIS, ROUTINE W REFLEX MICROSCOPIC  RAPID URINE DRUG SCREEN, HOSP PERFORMED  CBG MONITORING, ED  TROPONIN I (HIGH SENSITIVITY)    CT HEAD WO CONTRAST (5MM)  Final Result    CT ABDOMEN PELVIS WO CONTRAST  Final Result    DG Chest Port 1 View  Final Result      Medications  fentaNYL (SUBLIMAZE) injection 25 mcg (25 mcg Intravenous Not Given 09/29/22 0129)  norepinephrine (LEVOPHED) 4mg  in 274mL (0.016 mg/mL) premix infusion (4 mcg/min Intravenous Infusion Verify 09/29/22 0216)  lactated ringers infusion ( Intravenous New Bag/Given 09/29/22 0156)  doxycycline (VIBRAMYCIN) 100 mg in sodium chloride 0.9 % 250 mL IVPB (has no administration in time range)  sodium chloride 0.9 % bolus 1,000 mL (0 mLs Intravenous Stopped 09/29/22 0132)  atropine 1 MG/10ML injection 0.5 mg (0.5 mg Intravenous Given 09/29/22 0113)  methylPREDNISolone sodium succinate (SOLU-MEDROL) 125 mg/2 mL injection 125 mg (125 mg Intravenous Given 09/29/22 0119)  sodium chloride 0.9 % bolus 1,000 mL (0 mLs Intravenous Stopped 09/29/22 0301)  cefTRIAXone (ROCEPHIN) 2 g in sodium chloride 0.9 % 100 mL IVPB (0 g Intravenous Stopped 09/29/22 0203)  dextrose 50 % solution (50 mLs  Given 09/29/22 0120)  acetaminophen (TYLENOL) tablet 1,000 mg (1,000 mg Oral Given 09/29/22 0211)     Procedures  /  Critical Care .Critical Care  Performed by: Maudie Flakes, MD Authorized by: Maudie Flakes, MD   Critical care provider statement:    Critical care time (minutes):  85   Critical care was necessary to treat or prevent imminent or life-threatening deterioration of the following conditions:  Shock   Critical care was time spent personally by me on the following activities:  Development of treatment plan with patient or surrogate,  discussions with consultants, evaluation of patient's response to treatment, examination of patient, ordering and review of laboratory studies, ordering and review of radiographic studies, ordering and performing treatments and interventions, pulse oximetry, re-evaluation of patient's condition and review of old charts   ED Course and Medical Decision Making  Initial Impression and Ddx Weakness, on the ground, unclear downtime.  Nausea vomiting.  Differential diagnosis includes rhabdomyolysis, AKI, SBO, stroke, intracranial mass causing elevated ICP, UTI, pneumonia.  Bradycardic on arrival, soft blood pressure.  Feels well perfused, a bit hypothermic.  Received atropine with EMS.  Past medical/surgical history that increases complexity of ED encounter: None  Interpretation of Diagnostics I personally reviewed the EKG and my interpretation is as follows: Sinus bradycardia  Labs reveal no significant blood count or electrolyte disturbance, CT imaging is without acute processes.  Chest x-ray with possible pneumonia.  Patient Reassessment and Ultimate Disposition/Management     Patient with persistent bradycardia and hypotension, not really responding to repeated dose of atropine here in the emergency department.  Not really responding to 2 L of fluid.  Given Solu-Medrol to treat possible adrenal insufficiency or crisis.  Given antibiotics to treat for possible pneumonia or sepsis.  Started on low-dose norepinephrine with improvement in blood pressure.  Other considerations at this point include hypothyroidism, symptomatic bradycardia.  Will admit to ICU.  Patient management required discussion with the following services or consulting groups:  Intensivist Service  Complexity of Problems Addressed Acute illness or injury that poses threat of life of bodily function  Additional Data Reviewed and Analyzed Further history obtained from: EMS on arrival  Additional Factors Impacting ED Encounter  Risk Consideration of hospitalization  Elmer Sow. Pilar Plate, MD [facility] Emergency Medicine [facility] [email or web address]  Final Clinical Impressions(s) / ED Diagnoses     ICD-10-CM   1. Hypotension, unspecified hypotension type  I95.9     2. Bradycardia  R00.1     3. Nausea and vomiting, unspecified vomiting type  R11.2       ED Discharge Orders     None        Discharge Instructions Discussed with and Provided to Patient:   Discharge Instructions   None      Sabas Sous, MD 09/29/22 [phone]

## 2022-09-29 NOTE — ED Notes (Addendum)
MD at bedside MD aware of pt BP - second fluid bolus hung with pressure bag

## 2022-09-29 NOTE — ED Notes (Signed)
Admitting ICU at bedside

## 2022-09-30 ENCOUNTER — Inpatient Hospital Stay (HOSPITAL_COMMUNITY): Payer: Medicare HMO

## 2022-09-30 DIAGNOSIS — R001 Bradycardia, unspecified: Secondary | ICD-10-CM | POA: Diagnosis not present

## 2022-09-30 LAB — GLUCOSE, CAPILLARY
Glucose-Capillary: 116 mg/dL — ABNORMAL HIGH (ref 70–99)
Glucose-Capillary: 124 mg/dL — ABNORMAL HIGH (ref 70–99)
Glucose-Capillary: 138 mg/dL — ABNORMAL HIGH (ref 70–99)
Glucose-Capillary: 113 mg/dL — ABNORMAL HIGH (ref 70–99)

## 2022-09-30 LAB — VITAMIN D 25 HYDROXY (VIT D DEFICIENCY, FRACTURES): Vit D, 25-Hydroxy: 12.17 ng/mL — ABNORMAL LOW (ref 30–100)

## 2022-09-30 LAB — CBC WITH DIFFERENTIAL/PLATELET
Abs Immature Granulocytes: 0.07 10*3/uL (ref 0.00–0.07)
Basophils Absolute: 0 10*3/uL (ref 0.0–0.1)
Basophils Relative: 0 %
Eosinophils Absolute: 0 10*3/uL (ref 0.0–0.5)
Eosinophils Relative: 0 %
HCT: 29.4 % — ABNORMAL LOW (ref 36.0–46.0)
Hemoglobin: 9.7 g/dL — ABNORMAL LOW (ref 12.0–15.0)
Immature Granulocytes: 1 %
Lymphocytes Relative: 8 %
Lymphs Abs: 0.9 10*3/uL (ref 0.7–4.0)
MCH: 28 pg (ref 26.0–34.0)
MCHC: 33 g/dL (ref 30.0–36.0)
MCV: 84.7 fL (ref 80.0–100.0)
Monocytes Absolute: 0.4 10*3/uL (ref 0.1–1.0)
Monocytes Relative: 4 %
Neutro Abs: 10.1 10*3/uL — ABNORMAL HIGH (ref 1.7–7.7)
Neutrophils Relative %: 87 %
Platelets: 386 10*3/uL (ref 150–400)
RBC: 3.47 MIL/uL — ABNORMAL LOW (ref 3.87–5.11)
RDW: 15.2 % (ref 11.5–15.5)
WBC: 11.5 10*3/uL — ABNORMAL HIGH (ref 4.0–10.5)
nRBC: 0 % (ref 0.0–0.2)

## 2022-09-30 LAB — BASIC METABOLIC PANEL
Anion gap: 10 (ref 5–15)
BUN: 10 mg/dL (ref 8–23)
CO2: 16 mmol/L — ABNORMAL LOW (ref 22–32)
Calcium: 8.9 mg/dL (ref 8.9–10.3)
Chloride: 106 mmol/L (ref 98–111)
Creatinine, Ser: 0.64 mg/dL (ref 0.44–1.00)
GFR, Estimated: >60 mL/min (ref >60)
Glucose, Bld: 115 mg/dL — ABNORMAL HIGH (ref 70–99)
Potassium: 5 mmol/L (ref 3.5–5.1)
Sodium: 132 mmol/L — ABNORMAL LOW (ref 135–145)

## 2022-09-30 LAB — PHOSPHORUS: Phosphorus: 1.7 mg/dL — ABNORMAL LOW (ref 2.5–4.6)

## 2022-09-30 LAB — T3, FREE: T3, Free: 2 pg/mL (ref 2.0–4.4)

## 2022-09-30 LAB — PROCALCITONIN: Procalcitonin: 0.58 ng/mL

## 2022-09-30 LAB — MAGNESIUM: Magnesium: 1.9 mg/dL (ref 1.7–2.4)

## 2022-09-30 MED ORDER — SODIUM PHOSPHATES 45 MMOLE/15ML IV SOLN
30.0000 mmol | Freq: Once | INTRAVENOUS | Status: AC
  Administered 2022-09-30: 30 mmol via INTRAVENOUS
  Filled 2022-09-30: qty 10

## 2022-09-30 MED ORDER — ADULT MULTIVITAMIN W/MINERALS CH
1.0000 | ORAL_TABLET | Freq: Every day | ORAL | Status: DC
  Filled 2022-09-30 (×4): qty 1

## 2022-09-30 MED ORDER — DOXYCYCLINE HYCLATE 100 MG PO TABS
100.0000 mg | ORAL_TABLET | Freq: Two times a day (BID) | ORAL | Status: DC
  Administered 2022-09-30 – 2022-10-03 (×6): 100 mg via ORAL
  Filled 2022-09-30 (×7): qty 1

## 2022-09-30 MED ORDER — TRAZODONE HCL 50 MG PO TABS
50.0000 mg | ORAL_TABLET | Freq: Once | ORAL | Status: AC
  Administered 2022-09-30: 50 mg via ORAL
  Filled 2022-09-30: qty 1

## 2022-09-30 MED ORDER — K PHOS MONO-SOD PHOS DI & MONO 155-852-130 MG PO TABS
500.0000 mg | ORAL_TABLET | Freq: Two times a day (BID) | ORAL | Status: AC
  Administered 2022-09-30 – 2022-10-01 (×2): 500 mg via ORAL
  Filled 2022-09-30 (×3): qty 2

## 2022-09-30 MED ORDER — POLYVINYL ALCOHOL 1.4 % OP SOLN
1.0000 [drp] | OPHTHALMIC | Status: DC | PRN
  Administered 2022-09-30: 1 [drp] via OPHTHALMIC
  Filled 2022-09-30: qty 15

## 2022-09-30 MED ORDER — ALPRAZOLAM 0.5 MG PO TABS
0.5000 mg | ORAL_TABLET | Freq: Three times a day (TID) | ORAL | Status: DC | PRN
  Administered 2022-09-30 – 2022-10-04 (×9): 0.5 mg via ORAL
  Filled 2022-09-30 (×11): qty 1

## 2022-09-30 NOTE — Progress Notes (Addendum)
[name], MRN:  [id number], DOB:  [birth year], LOS: 1 ADMISSION DATE:  09/28/2022, CONSULTATION DATE:  09/29/2022 REFERRING MD: EDP, CHIEF COMPLAINT: Bradycardia  History of Present Illness:  This is a 66 year old female presented to the emergency room with weakness and fatigue.  Patient reports having 3 weeks of nausea, vomiting, and diarrhea leading to progressive weakness.  Patient was found to have low heart rate into the 30s.  Patient also was hypotensive into the low 70s.  Patient also had hypokalemia.  Patient admitted to ICU for further work-up and treatment of symptomatic bradycardia with hypotension.  Pertinent  Medical History  Hypotension, chronic pain, anxiety, IBS  Significant Hospital Events: Including procedures, antibiotic start and stop dates in addition to other pertinent events   10/5: Admit to ICU  Interim History / Subjective:  Overnight events: Patient required some Levophed to keep systolics above 90.  Patient is resting in bed upon my exam.  Patient is currently complaining of anxiety as well as nausea.  Patient reports that she has not had any vomiting and reports a good appetite.  She reports that she is in pain, and needs her pain medicine.  She states no other complaints today.  Objective   Blood pressure (!) 143/75, pulse 83, temperature 98.5 F (36.9 C), temperature source Oral, resp. rate 18, height 5\' 5"  (1.651 m), weight 52 kg, SpO2 97 %.        Intake/Output Summary (Last 24 hours) at 09/30/2022 1205 Last data filed at 09/30/2022 1000 Gross per 24 hour  Intake 4202.84 ml  Output 250 ml  Net 3952.84 ml   Filed Weights   09/29/22 0001  Weight: 52 kg    Examination:  General: Patient is resting comfortably in bed in no acute distress, ill appearing  Eyes: Pupils equal and reactive to light Head: Normocephalic, atraumatic  Cardio: Regular rate and rhythm, Grade III/VI systolic murmur heard to left lower sternal border Pulmonary: Clear  to ausculation bilaterally with no rales, rhonchi, and crackles  Abdomen: Soft, nontender Neuro: Alert and orientated x3 Skin: Wound noted to lower sacral region which was present on admission. No signs of infection   MSK: 4/5 strength to upper and lower extremities.   Resolved Hospital Problem list     Assessment & Plan:  This is a 66 year old female presented with concerns of fatigue and weakness.  Patient found to have heart rate into the 30s.  Patient also found to have hypokalemia.  Patient admitted for further work-up of symptomatic bradycardia and hypokalemia.  #Symptomatic bradycardia, resolving Patient still remains with bradycardia at times.  Patient heart rate into the 80s at this time.  Patient does report some lightheadedness.  Echo showing preserved ejection fraction.  With electrolyte repletion, patient has rebound of her heart rate.  Not concern for any conduction delay versus thyroid abnormality.  No need for cardiology consult at this time given that with electrolyte repletion, heart rate has improved without the need of pressors. -Goal heart rate of 60 -Continue to monitor heart rate -Replete electrolytes  #Hypotension, resolving Overnight, patient required some Levophed, but has not required any other pressors.  Patient has not been weaned off of pressors for about 10 hours.  This could be in the setting of bradycardia, given that as heart rate has increased and now, blood pressure has been stable.  Patient is current blood pressure is 112/61.  Leave Levophed as a as needed order, but likely will not need given heart  rate is improving. -Likely in the setting of bradycardia -Continue to monitor blood pressure -Avoid any blood pressure dropping medicines -Hold is systolic greater than 90  #Left pneumonia #Leukocytosis, secondary to above, resolving Pro-Cal up to 4.58 no.  Chest x-ray and CT showed possible infiltrate.  Will treat empirically with ceftriaxone and  doxycycline for 7 days.  White count at 11.5. -Continue with ceftriaxone and doxycycline (day 2 of 7) -Monitor white count -Monitor fever curve   #Hypokalemia, resolved #Hypomagnesemia, resolved #Hypophosphatemia, resolving Potassium 5.0, magnesium 1.9, phosphorus 1.7.  Will replete phosphorus today. -Recheck in a.m.  #Decubitus ulcer -Patient does have wound to lower back.  This is tiny, with some eschar around it.  Wound care following. -Continue wound care  #Chronic pain Patient is complaining of chronic back pain and knee pain -Continue home oxycodone 10 mg every 6 hours as needed  #Anxiety/depression Patient reports that she takes Xanax for depression and anxiety.  She reports that it helps her breathing, and she feels that she gets tachypneic if she does not get her Xanax.  We will resume home Xanax, half dose, and continue to monitor for any effects on heart rate -Resume home Xanax 0.5 mg 3 times daily   Best Practice (right click and "Reselect all SmartList Selections" daily)   Diet/type: Regular consistency (see orders) DVT prophylaxis: LMWH GI prophylaxis: PPI Lines: N/A Foley:  N/A Code Status:  full code  Labs   CBC: Recent Labs  Lab 09/29/22 0024 09/29/22 0422 09/30/22 0712  WBC 12.3* 13.4* 11.5*  NEUTROABS  --   --  10.1*  HGB 12.3 12.5 9.7*  HCT 40.4 38.7 29.4*  MCV 90.2 88.2 84.7  PLT 494* 502* 386    Basic Metabolic Panel: Recent Labs  Lab 09/29/22 0024 09/29/22 0422 09/29/22 1134 09/30/22 0712  NA 135 136 133* 132*  K 2.8* 3.0* 3.7 5.0  CL 103 105 106 106  CO2 14* 11* 16* 16*  GLUCOSE 81 135* 192* 115*  BUN 15 12 11 10   CREATININE 1.04* 0.82 0.97 0.64  CALCIUM 9.0 8.6* 8.5* 8.9  MG  --  1.6*  --  1.9  PHOS  --  2.4*  --  1.7*   GFR: Estimated Creatinine Clearance: 55.3 mL/min (by C-G formula based on SCr of 0.64 mg/dL). Recent Labs  Lab 09/29/22 0024 09/29/22 0422 09/30/22 0712  PROCALCITON  --  0.27 0.58  WBC 12.3* 13.4*  11.5*  LATICACIDVEN 1.1  --   --     Liver Function Tests: Recent Labs  Lab 09/29/22 0024  AST 16  ALT 10  ALKPHOS 144*  BILITOT 0.9  PROT 5.8*  ALBUMIN 2.8*   Recent Labs  Lab 09/29/22 0024  LIPASE 24   No results for input(s): "AMMONIA" in the last 168 hours.  ABG No results found for: "PHART", "PCO2ART", "PO2ART", "HCO3", "TCO2", "ACIDBASEDEF", "O2SAT"   Coagulation Profile: No results for input(s): "INR", "PROTIME" in the last 168 hours.  Cardiac Enzymes: Recent Labs  Lab 09/29/22 0024  CKTOTAL 30*    HbA1C: Hgb A1c MFr Bld  Date/Time Value Ref Range Status  09/29/2022 11:34 AM 5.0 4.8 - 5.6 % Final    Comment:    (NOTE) Pre diabetes:          5.7%-6.4%  Diabetes:              >6.4%  Glycemic control for   <7.0% adults with diabetes     CBG: Recent Labs  Lab 09/29/22 0354 09/29/22 0605 09/29/22 1701 09/29/22 2136 09/30/22 0748  GLUCAP 124* 120* 157* 231* 116*    Review of Systems:   Negative except stated in HPI  Past Medical History:  She,  has a past medical history of Anxiety, Arthritis, Chronic pain, Gastroenteritis, Headache, Hypotension, Insomnia, and Suicidal ideation.   Surgical History:   Past Surgical History:  Procedure Laterality Date   BACK SURGERY     Lumbar Fusion   CARPAL TUNNEL RELEASE     LAPAROSCOPIC OOPHERECTOMY     MOUTH SURGERY       Social History:   reports that she has never smoked. She has never used smokeless tobacco. She reports that she does not currently use alcohol. She reports that she does not use drugs.   Family History:  Her family history is negative for Colon cancer.   Allergies Allergies  Allergen Reactions   Almond Oil Itching   Amoxicillin-Pot Clavulanate Nausea And Vomiting and Other (See Comments)    Can tolerate plain Amoxicillin (??)   Azithromycin Nausea And Vomiting   Bupropion Nausea And Vomiting   Nsaids Other (See Comments)    Stomach problems   Prednisone Diarrhea    Vilazodone Diarrhea and Other (See Comments)    Viibryd     Home Medications  Prior to Admission medications   Medication Sig Start Date End Date Taking? Authorizing Provider  acetaminophen (TYLENOL) 500 MG tablet Take 2 tablets (1,000 mg total) by mouth every 8 (eight) hours as needed for mild pain or headache (or Fever >/= 101). 07/30/22   Barton Dubois, MD  ALPRAZolam Duanne Moron) 1 MG tablet Take 1 tablet (1 mg total) by mouth 3 (three) times daily as needed for anxiety or sleep. 05/17/22   Daleen Bo, MD  butalbital-acetaminophen-caffeine (FIORICET) [phone] MG tablet Take 1-2 tablets by mouth 2 (two) times daily as needed for headache. 05/17/22   Daleen Bo, MD  loperamide (IMODIUM) 2 MG capsule Take 2 mg by mouth every 6 (six) hours as needed. 05/09/22   [provider]  loratadine (CLARITIN) 10 MG tablet Take 1 tablet (10 mg total) by mouth daily. 07/31/22   Barton Dubois, MD  pantoprazole (PROTONIX) 40 MG tablet Take 1 tablet (40 mg total) by mouth daily. 07/30/22   Barton Dubois, MD  traZODone (DESYREL) 100 MG tablet Take 1 tablet (100 mg total) by mouth at bedtime as needed for sleep. 07/30/22   Barton Dubois, MD     Critical care time: [place], DO Internal Medicine Resident PGY-1 Pager: [phone]

## 2022-09-30 NOTE — Progress Notes (Signed)
[facility] Progress Note Patient Name: Jamie [name] DOB: 01-22-1956 MRN: [id number]   Date of Service  09/30/2022  HPI/Events of Note  Patient requests sleep aid. Received Xanax 0.5 mg PO without effect. Patient takes Trazodone at home.   eICU Interventions  Plan: Trazodone 50 mg PO X 1.      Intervention Category Major Interventions: Other:  Edman Lipsey Cornelia Copa 09/30/2022, 9:15 PM

## 2022-09-30 NOTE — Progress Notes (Signed)
IV consult for PIV: Patient assessed with ultrasound. R arm is swollen almost to the axilla from infiltration. L arm swollen, pink. and tender to touch. No suitable veins noted on bilateral arms. Recommend central line placement for IV access. RN aware.

## 2022-09-30 NOTE — Evaluation (Signed)
Physical Therapy Evaluation Patient Details Name: Jamie [name] MRN: [id number] DOB: 13-Feb-1956 Today's Date: 09/30/2022  History of Present Illness  pt is a 66 y/o female admitted 10/4 with 3 weeks of nausea, vomiting and diarrhea leading to progressive weakness before finally calling EMS due to not being able to get off the couch .  Marland Kitchen PMH: arthralgia, headaches, chronic pain syndrome with prior back surgery, chronic opiate use, insomnia on bezodiazepine, alcohol abuse, hx of noncompliance.  Clinical Impression  Pt admitted with/for  the above related event and presently needing moderate assist for general mobility and standing.  Have not assess gait due to pt's profound weakness.  Pt currently limited functionally due to the problems listed. ( See problems list.)   Pt will benefit from PT to maximize function and safety in order to get ready for next venue listed below.        Recommendations for follow up therapy are one component of a multi-disciplinary discharge planning process, led by the attending physician.  Recommendations may be updated based on patient status, additional functional criteria and insurance authorization.  Follow Up Recommendations Skilled nursing-short term rehab (<3 hours/day) Can patient physically be transported by private vehicle: No    Assistance Recommended at Discharge Frequent or constant Supervision/Assistance  Patient can return home with the following  A little help with walking and/or transfers;A little help with bathing/dressing/bathroom;Assistance with cooking/housework;Assist for transportation;Help with stairs or ramp for entrance;Direct supervision/assist for medications management    Equipment Recommendations BSC/3in1  Recommendations for Other Services  Rehab consult    Functional Status Assessment Patient has had a recent decline in their functional status and demonstrates the ability to make significant improvements in function in a reasonable  and predictable amount of time.     Precautions / Restrictions Precautions Precautions: Fall      Mobility  Bed Mobility Overal bed mobility: Needs Assistance Bed Mobility: Rolling, Sidelying to Sit, Sit to Supine Rolling: Mod assist Sidelying to sit: Mod assist, HOB elevated Supine to sit: Mod assist Sit to supine: Mod assist, Max assist, +2 for physical assistance   General bed mobility comments: cues for sequencing to decrease pain.  truncal and LE assist    Transfers Overall transfer level: Needs assistance   Transfers: Sit to/from Stand Sit to Stand: Mod assist, +2 safety/equipment           General transfer comment: x2    Ambulation/Gait Ambulation/Gait assistance: Mod assist Gait Distance (Feet): 3 Feet       Gait velocity interpretation: <1.31 ft/sec, indicative of household ambulator   General Gait Details: face to face side-stepping to [facility].  Lots of w/shift assist and support for stepping.  Stairs            Wheelchair Mobility    Modified Rankin (Stroke Patients Only)       Balance Overall balance assessment: Needs assistance Sitting-balance support: Bilateral upper extremity supported, Single extremity supported, Feet supported Sitting balance-Leahy Scale: Fair Sitting balance - Comments: prefers UE assist due to pain, but able to sit unsupported   Standing balance support: Bilateral upper extremity supported Standing balance-Leahy Scale: Poor Standing balance comment: face to face standing at EOB for salve application, before stepping to [facility]                             Pertinent Vitals/Pain Pain Assessment Pain Assessment: Faces Faces Pain Scale: Hurts even more Pain Location: bottom  raw Pain Descriptors / Indicators: Burning, Grimacing, Discomfort Pain Intervention(s): Monitored during session    Home Living Family/patient expects to be discharged to:: Private residence Living Arrangements: Alone Available Help  at Discharge: Family;Available PRN/intermittently (?PCA) Type of Home: House Home Access: Stairs to enter Entrance Stairs-Rails: None Entrance Stairs-Number of Steps: 3   Home Layout: One level;Able to live on main level with bedroom/bathroom Home Equipment: Rolling Walker (2 wheels);Shower seat - built in;BSC/3in1;Cane - quad;Cane - single point Additional Comments: Pt reports that she has not had PCA for long    Prior Function Prior Level of Function : Needs assist;History of Falls (last six months)             Mobility Comments: household and short distanced community ambulator using SPC or RW, has not been driving per MD request due to recent seizure ADLs Comments: per chart home aide M and F x 3 hours/day     Hand Dominance   Dominant Hand: Right    Extremity/Trunk Assessment   Upper Extremity Assessment Upper Extremity Assessment: Generalized weakness    Lower Extremity Assessment Lower Extremity Assessment: Generalized weakness    Cervical / Trunk Assessment Cervical / Trunk Assessment: Normal  Communication   Communication: No difficulties  Cognition Arousal/Alertness: Awake/alert Behavior During Therapy: WFL for tasks assessed/performed, Anxious Overall Cognitive Status: No family/caregiver present to determine baseline cognitive functioning                                          General Comments General comments (skin integrity, edema, etc.): vss    Exercises     Assessment/Plan    PT Assessment Patient needs continued PT services  PT Problem List Decreased strength;Decreased activity tolerance;Decreased balance;Decreased mobility;Pain;Decreased knowledge of use of DME       PT Treatment Interventions DME instruction;Gait training;Functional mobility training;Stair training;Therapeutic activities;Balance training;Patient/family education    PT Goals (Current goals can be found in the Care Plan section)  Acute Rehab PT  Goals Patient Stated Goal: ultimately get home PT Goal Formulation: With patient Time For Goal Achievement: 10/14/22 Potential to Achieve Goals: Good    Frequency Min 3X/week     Co-evaluation               AM-PAC PT "6 Clicks" Mobility  Outcome Measure Help needed turning from your back to your side while in a flat bed without using bedrails?: A Lot Help needed moving from lying on your back to sitting on the side of a flat bed without using bedrails?: A Lot Help needed moving to and from a bed to a chair (including a wheelchair)?: A Lot Help needed standing up from a chair using your arms (e.g., wheelchair or bedside chair)?: A Lot Help needed to walk in hospital room?: Total Help needed climbing 3-5 steps with a railing? : Total 6 Click Score: 10    End of Session Equipment Utilized During Treatment: Oxygen Activity Tolerance: Patient tolerated treatment well;Patient limited by pain Patient left: in bed;with call bell/phone within reach;with bed alarm set Nurse Communication: Mobility status PT Visit Diagnosis: Other abnormalities of gait and mobility (R26.89);Pain Pain - part of body:  (bottom)    Time: 3299-2426 PT Time Calculation (min) (ACUTE ONLY): 32 min   Charges:   PT Evaluation $PT Eval Moderate Complexity: 1 Mod PT Treatments $Therapeutic Activity: 8-22 mins  09/30/2022  Ginger Carne., PT Acute Rehabilitation Services [phone]  (office)  Jamie [name] 09/30/2022, 6:37 PM

## 2022-09-30 NOTE — Progress Notes (Signed)
[place] Progress Note Patient Name: Jamie [name] DOB: 07-15-1956 MRN: [id number]   Date of Service  09/30/2022  HPI/Events of Note  Patient c/o dry and itchy eyes. Requesting eye drops.  eICU Interventions  Liquifilm Tears 1 drop to both eyes PRN dry eyes.      Intervention Category Major Interventions: Other:  Cing  Cornelia Copa 09/30/2022, 2:12 AM

## 2022-10-01 LAB — CBC WITH DIFFERENTIAL/PLATELET
Abs Immature Granulocytes: 0.07 10*3/uL (ref 0.00–0.07)
Basophils Absolute: 0 10*3/uL (ref 0.0–0.1)
Basophils Relative: 0 %
Eosinophils Absolute: 0 10*3/uL (ref 0.0–0.5)
Eosinophils Relative: 0 %
HCT: 32.4 % — ABNORMAL LOW (ref 36.0–46.0)
Hemoglobin: 10.5 g/dL — ABNORMAL LOW (ref 12.0–15.0)
Immature Granulocytes: 1 %
Lymphocytes Relative: 22 %
Lymphs Abs: 3.1 10*3/uL (ref 0.7–4.0)
MCH: 27.9 pg (ref 26.0–34.0)
MCHC: 32.4 g/dL (ref 30.0–36.0)
MCV: 85.9 fL (ref 80.0–100.0)
Monocytes Absolute: 1.1 10*3/uL — ABNORMAL HIGH (ref 0.1–1.0)
Monocytes Relative: 8 %
Neutro Abs: 9.6 10*3/uL — ABNORMAL HIGH (ref 1.7–7.7)
Neutrophils Relative %: 69 %
Platelets: 498 10*3/uL — ABNORMAL HIGH (ref 150–400)
RBC: 3.77 MIL/uL — ABNORMAL LOW (ref 3.87–5.11)
RDW: 15.4 % (ref 11.5–15.5)
WBC: 13.9 10*3/uL — ABNORMAL HIGH (ref 4.0–10.5)
nRBC: 0 % (ref 0.0–0.2)

## 2022-10-01 LAB — BASIC METABOLIC PANEL
Anion gap: 8 (ref 5–15)
BUN: 8 mg/dL (ref 8–23)
CO2: 19 mmol/L — ABNORMAL LOW (ref 22–32)
Calcium: 8.9 mg/dL (ref 8.9–10.3)
Chloride: 106 mmol/L (ref 98–111)
Creatinine, Ser: 0.82 mg/dL (ref 0.44–1.00)
GFR, Estimated: >60 mL/min (ref >60)
Glucose, Bld: 100 mg/dL — ABNORMAL HIGH (ref 70–99)
Potassium: 4.7 mmol/L (ref 3.5–5.1)
Sodium: 133 mmol/L — ABNORMAL LOW (ref 135–145)

## 2022-10-01 LAB — GLUCOSE, CAPILLARY
Glucose-Capillary: 104 mg/dL — ABNORMAL HIGH (ref 70–99)
Glucose-Capillary: 108 mg/dL — ABNORMAL HIGH (ref 70–99)
Glucose-Capillary: 122 mg/dL — ABNORMAL HIGH (ref 70–99)
Glucose-Capillary: 119 mg/dL — ABNORMAL HIGH (ref 70–99)

## 2022-10-01 LAB — PROCALCITONIN: Procalcitonin: 0.39 ng/mL

## 2022-10-01 NOTE — Progress Notes (Signed)
Followed up with pt regarding effectiveness of zofran for increased meal intake. Pt states that it helped a lot and was able to eat full meal.

## 2022-10-01 NOTE — Progress Notes (Signed)
Pt reports feeling nauseas when eating meals. Discussed with pt trying zofran prior to eating meal in attempt to increase amount of meal intake. Zofran administered, will come back and reassess

## 2022-10-01 NOTE — Plan of Care (Signed)
°  Problem: Clinical Measurements: °Goal: Respiratory complications will improve °Outcome: Progressing °Goal: Cardiovascular complication will be avoided °Outcome: Progressing °  °Problem: Activity: °Goal: Risk for activity intolerance will decrease °Outcome: Progressing °  °

## 2022-10-01 NOTE — Progress Notes (Signed)
PROGRESS NOTE    Jamie [name]  GQQ:[id number] DOB: [birth year] DOA: 09/28/2022 PCP: Barbie Banner, MD  Chief Complaint  Patient presents with   Bradycardia   Weakness    Brief Narrative:  67 yo with hx chronic pain, anxiety, IBS, hypotension who presented with weakness and fatigue after 3 weeks of nausea, vomiting, and diarrhea. She was was found to have low heart rate into the 30s.  Patient also was hypotensive into the low 70s.  Patient also had significant electrolyte abnormalities.  Patient admitted to ICU for further work-up and treatment of symptomatic bradycardia with hypotension.   Assessment & Plan:   Principal Problem:   Symptomatic bradycardia Active Problems:   Shock circulatory (HCC)   Malnutrition of moderate degree   Hypotension   Assessment and Plan: #Symptomatic bradycardia, resolving - rate improved at this time - limited echo with EF 64%, grade II diastolic dysfunction  - TSH not suggestive of significant hypothyroidism (repeat outpatient) - improved at this time, suspected related to electrolyte abnormalities at presentation?   #Hypotension, resolving - now off pressors -Likely in the setting of bradycardia -Continue to monitor blood pressure -Avoid any blood pressure dropping medicines -Hold is systolic greater than 90   #Left pneumonia #Leukocytosis, secondary to above, resolving CXR 10/6 with persistent ill defined opacity within the left lung base (concern for atelectassi or pneumonia) CT abd/pelvis with moderate L and small R effusions Pro-Cal up to 4.58  Will treat empirically with ceftriaxone and doxycycline   #Hypokalemia, resolved #Hypomagnesemia, resolved #Hypophosphatemia, resolving - improved   #Decubitus ulcer -Patient does have wound to lower back.  This is tiny, with some eschar around it.  Wound care following. -Continue wound care   #Chronic pain Patient is complaining of chronic back pain and knee pain -Continue home  oxycodone 10 mg every 6 hours as needed   #Anxiety/depression -Resume home Xanax 0.5 mg 3 times daily (half dose)  # Moderate Malnutrition - continue RD  # Pressure Ulcer Pressure Injury 09/29/22 Sacrum Mid Unstageable - Full thickness tissue loss in which the base of the injury is covered by slough (yellow, tan, gray, green or brown) and/or eschar (tan, brown or black) in the wound bed. 2x1.5cm irregular eschar [name] (Active)  09/29/22 0919  Location: Sacrum  Location Orientation: Mid  Staging: Unstageable - Full thickness tissue loss in which the base of the injury is covered by slough (yellow, tan, gray, green or brown) and/or eschar (tan, brown or black) in the wound bed.  Wound Description (Comments): 2x1.5cm irregular eschar (unstageable) surrounded by stage II  Present on Admission: Yes        DVT prophylaxis: heparin Code Status: full Family Communication: none Disposition:   Status is: Inpatient Remains inpatient appropriate because: noen   Consultants:  Pccm  Procedures:  Echo IMPRESSIONS     1. Left ventricular ejection fraction by 3D volume is 64 %. Left  ventricular diastolic parameters are consistent with Grade II diastolic  dysfunction (pseudonormalization).   2. Right ventricular systolic function is normal. The right ventricular  size is normal. There is mildly elevated pulmonary artery systolic  pressure. The estimated right ventricular systolic pressure is 37.1 mmHg.   3. Left atrial size was severely dilated.   4. Right atrial size was mildly dilated.   5. The mitral valve is grossly normal. Trivial mitral valve  regurgitation. No evidence of mitral stenosis.   6. The aortic valve is tricuspid. Aortic valve regurgitation is trivial.  No  aortic stenosis is present.   7. The inferior vena cava is normal in size with greater than 50%  respiratory variability, suggesting right atrial pressure of 3 mmHg.   Antimicrobials:  Anti-infectives (From  admission, onward)    Start     Dose/Rate Route Frequency Ordered Stop   09/30/22 2200  doxycycline (VIBRA-TABS) tablet 100 mg        100 mg Oral Every 12 hours 09/30/22 1150     09/30/22 0100  cefTRIAXone (ROCEPHIN) 2 g in sodium chloride 0.9 % 100 mL IVPB        2 g 200 mL/hr over 30 Minutes Intravenous Every 24 hours 09/29/22 0853 10/04/22 0059   09/29/22 1000  doxycycline (VIBRAMYCIN) 100 mg in sodium chloride 0.9 % 250 mL IVPB  Status:  Discontinued        100 mg 125 mL/hr over 120 Minutes Intravenous Every 12 hours 09/29/22 0902 09/30/22 1150   09/29/22 0415  doxycycline (VIBRAMYCIN) 100 mg in sodium chloride 0.9 % 250 mL IVPB  Status:  Discontinued        100 mg 125 mL/hr over 120 Minutes Intravenous Every 12 hours 09/29/22 0400 09/29/22 0902   09/29/22 0130  cefTRIAXone (ROCEPHIN) 2 g in sodium chloride 0.9 % 100 mL IVPB        2 g 200 mL/hr over 30 Minutes Intravenous  Once 09/29/22 0116 09/29/22 0203       Subjective: No complaints  Objective: Vitals:   10/01/22 0041 10/01/22 0456 10/01/22 0800 10/01/22 1400  BP: 136/70 (!) 141/84 (!) 150/79 (!) 151/74  Pulse: 87 88 95 90  Resp: 15 16 17 18   Temp: 98.6 F (37 C) 98.6 F (37 C) 98.3 F (36.8 C) 98.1 F (36.7 C)  TempSrc: Oral Oral Oral Oral  SpO2: 98% 97% 100% 96%  Weight:  64.4 kg    Height:        Intake/Output Summary (Last 24 hours) at 10/01/2022 1743 Last data filed at 10/01/2022 1500 Gross per 24 hour  Intake 120 ml  Output 625 ml  Net -505 ml   Filed Weights   09/29/22 0001 10/01/22 0456  Weight: 52 kg 64.4 kg    Examination:  General exam: Appears calm and comfortable  Respiratory system: unlabored Cardiovascular system: RRR Gastrointestinal system: Abdomen is nondistended, soft and nontender. Central nervous system: Alert and oriented. No focal neurological deficits. Extremities: no LEE   Data Reviewed: I have personally reviewed following labs and imaging studies  CBC: Recent Labs   Lab 09/29/22 0024 09/29/22 0422 09/30/22 0712 10/01/22 0137  WBC 12.3* 13.4* 11.5* 13.9*  NEUTROABS  --   --  10.1* 9.6*  HGB 12.3 12.5 9.7* 10.5*  HCT 40.4 38.7 29.4* 32.4*  MCV 90.2 88.2 84.7 85.9  PLT 494* 502* 386 498*    Basic Metabolic Panel: Recent Labs  Lab 09/29/22 0024 09/29/22 0422 09/29/22 1134 09/30/22 0712 10/01/22 0137  NA 135 136 133* 132* 133*  K 2.8* 3.0* 3.7 5.0 4.7  CL 103 105 106 106 106  CO2 14* 11* 16* 16* 19*  GLUCOSE 81 135* 192* 115* 100*  BUN 15 12 11 10 8   CREATININE 1.04* 0.82 0.97 0.64 0.82  CALCIUM 9.0 8.6* 8.5* 8.9 8.9  MG  --  1.6*  --  1.9  --   PHOS  --  2.4*  --  1.7*  --     GFR: Estimated Creatinine Clearance: 59.1 mL/min (by C-G formula based on SCr  of 0.82 mg/dL).  Liver Function Tests: Recent Labs  Lab 09/29/22 0024  AST 16  ALT 10  ALKPHOS 144*  BILITOT 0.9  PROT 5.8*  ALBUMIN 2.8*    CBG: Recent Labs  Lab 09/30/22 1509 09/30/22 2122 10/01/22 0841 10/01/22 1153 10/01/22 1726  GLUCAP 138* 113* 104* 108* 122*     Recent Results (from the past 240 hour(s))  Culture, blood (Routine X 2) w Reflex to ID Panel     Status: None (Preliminary result)   Collection Time: 09/29/22 12:24 AM   Specimen: BLOOD LEFT WRIST  Result Value Ref Range Status   Specimen Description BLOOD LEFT WRIST  Final   Special Requests   Final    BOTTLES DRAWN AEROBIC AND ANAEROBIC Blood Culture adequate volume   Culture   Final    NO GROWTH 2 DAYS Performed at [facility] [address] [place], [place] [address]    Report Status PENDING  Incomplete  Culture, blood (Routine X 2) w Reflex to ID Panel     Status: None (Preliminary result)   Collection Time: 09/29/22 12:40 AM   Specimen: BLOOD RIGHT HAND  Result Value Ref Range Status   Specimen Description BLOOD RIGHT HAND  Final   Special Requests   Final    BOTTLES DRAWN AEROBIC AND ANAEROBIC Blood Culture adequate volume   Culture   Final    NO GROWTH 2  DAYS Performed at [facility], [place] [address], [place], [place] [address]    Report Status PENDING  Incomplete  MRSA Next Gen by PCR, Nasal     Status: None   Collection Time: 09/29/22  5:59 AM   Specimen: Nasal Mucosa; Nasal Swab  Result Value Ref Range Status   MRSA by PCR Next Gen NOT DETECTED NOT DETECTED Final    Comment: (NOTE) The GeneXpert MRSA Assay (FDA approved for NASAL specimens only), is one component of Jeraline Marcinek comprehensive MRSA colonization surveillance program. It is not intended to diagnose MRSA infection nor to guide or monitor treatment for MRSA infections. Test performance is not FDA approved in patients less than 40 years old. Performed at [facility], [place] [address], [place], [place] [address]          Radiology Studies: DG Chest Port 1 View  Result Date: 09/30/2022 CLINICAL DATA:  Provided history: Pneumonia. Shortness of breath. Bradycardia. EXAM: PORTABLE CHEST 1 VIEW COMPARISON:  Prior chest radiographs 09/29/2022 and earlier. FINDINGS: Heart size within normal limits. Aortic atherosclerosis. Chronic, mild elevation of the left hemidiaphragm. Persistent ill-defined opacity at the left lung base, which may reflect atelectasis or pneumonia. Mild atelectasis within the right mid and lower lung fields. No appreciable airspace consolidation on the right. No evidence of pleural effusion or pneumothorax. No acute bony abnormality identified. IMPRESSION: Persistent ill-defined opacity within the left lung base, which may reflect atelectasis or pneumonia. Mild atelectasis within the right mid and lower lung fields. Chronic, mild elevation of the left hemidiaphragm. Aortic Atherosclerosis (ICD10-I70.0). Electronically Signed   By: Kellie Simmering D.O.   On: 09/30/2022 08:30        Scheduled Meds:  acetaminophen  1,000 mg Oral q12n4p   diclofenac Sodium  2 g Topical QID   doxycycline  100 mg Oral Q12H   feeding supplement  1 Container Oral TID BM   fentaNYL  (SUBLIMAZE) injection  25 mcg Intravenous Once   Gerhardt's butt cream   Topical BID   heparin  5,000  Units Subcutaneous Q8H   insulin aspart  0-9 Units Subcutaneous TID WC   lidocaine  1 patch Transdermal Q24H   multivitamin with minerals  1 tablet Oral Daily   pantoprazole  40 mg Oral Daily   thiamine  100 mg Oral Daily   Continuous Infusions:  cefTRIAXone (ROCEPHIN)  IV 2 g (10/01/22 0059)     LOS: 2 days    Time spent: over 30 min    Lacretia Nicks, MD [facility]   To contact the attending provider between 7A-7P or the covering provider during after hours 7P-7A, please log into the web site [email or web address] and access using universal [facility] password for that web site. If you do not have the password, please call the hospital operator.  10/01/2022, 5:43 PM

## 2022-10-02 ENCOUNTER — Inpatient Hospital Stay (HOSPITAL_COMMUNITY): Payer: Medicare HMO

## 2022-10-02 DIAGNOSIS — R609 Edema, unspecified: Secondary | ICD-10-CM | POA: Diagnosis not present

## 2022-10-02 DIAGNOSIS — R569 Unspecified convulsions: Secondary | ICD-10-CM

## 2022-10-02 LAB — CBC WITH DIFFERENTIAL/PLATELET
Abs Immature Granulocytes: 0.08 10*3/uL — ABNORMAL HIGH (ref 0.00–0.07)
Abs Immature Granulocytes: 0.23 10*3/uL — ABNORMAL HIGH (ref 0.00–0.07)
Basophils Absolute: 0.1 10*3/uL (ref 0.0–0.1)
Basophils Absolute: 0.2 10*3/uL — ABNORMAL HIGH (ref 0.0–0.1)
Basophils Relative: 1 %
Basophils Relative: 1 %
Eosinophils Absolute: 0.1 10*3/uL (ref 0.0–0.5)
Eosinophils Absolute: 0.3 10*3/uL (ref 0.0–0.5)
Eosinophils Relative: 1 %
Eosinophils Relative: 2 %
HCT: 33.1 % — ABNORMAL LOW (ref 36.0–46.0)
HCT: 43.4 % (ref 36.0–46.0)
Hemoglobin: 10.8 g/dL — ABNORMAL LOW (ref 12.0–15.0)
Hemoglobin: 13.7 g/dL (ref 12.0–15.0)
Immature Granulocytes: 1 %
Immature Granulocytes: 1 %
Lymphocytes Relative: 26 %
Lymphocytes Relative: 33 %
Lymphs Abs: 3 10*3/uL (ref 0.7–4.0)
Lymphs Abs: 7 10*3/uL — ABNORMAL HIGH (ref 0.7–4.0)
MCH: 27.9 pg (ref 26.0–34.0)
MCH: 28 pg (ref 26.0–34.0)
MCHC: 32.6 g/dL (ref 30.0–36.0)
MCHC: 31.6 g/dL (ref 30.0–36.0)
MCV: 85.5 fL (ref 80.0–100.0)
MCV: 88.6 fL (ref 80.0–100.0)
Monocytes Absolute: 0.8 10*3/uL (ref 0.1–1.0)
Monocytes Absolute: 1.4 10*3/uL — ABNORMAL HIGH (ref 0.1–1.0)
Monocytes Relative: 7 %
Monocytes Relative: 7 %
Neutro Abs: 7.8 10*3/uL — ABNORMAL HIGH (ref 1.7–7.7)
Neutro Abs: 11.9 10*3/uL — ABNORMAL HIGH (ref 1.7–7.7)
Neutrophils Relative %: 64 %
Neutrophils Relative %: 56 %
Platelets: 489 10*3/uL — ABNORMAL HIGH (ref 150–400)
Platelets: 774 10*3/uL — ABNORMAL HIGH (ref 150–400)
RBC: 3.87 MIL/uL (ref 3.87–5.11)
RBC: 4.9 MIL/uL (ref 3.87–5.11)
RDW: 15.6 % — ABNORMAL HIGH (ref 11.5–15.5)
RDW: 15.4 % (ref 11.5–15.5)
Smear Review: NORMAL
WBC: 11.9 10*3/uL — ABNORMAL HIGH (ref 4.0–10.5)
WBC: 21 10*3/uL — ABNORMAL HIGH (ref 4.0–10.5)
nRBC: 0 % (ref 0.0–0.2)
nRBC: 0 % (ref 0.0–0.2)

## 2022-10-02 LAB — LACTIC ACID, PLASMA: Lactic Acid, Venous: >9 mmol/L (ref 0.5–1.9)

## 2022-10-02 LAB — GLUCOSE, CAPILLARY
Glucose-Capillary: 95 mg/dL (ref 70–99)
Glucose-Capillary: 111 mg/dL — ABNORMAL HIGH (ref 70–99)
Glucose-Capillary: 112 mg/dL — ABNORMAL HIGH (ref 70–99)
Glucose-Capillary: 100 mg/dL — ABNORMAL HIGH (ref 70–99)
Glucose-Capillary: 171 mg/dL — ABNORMAL HIGH (ref 70–99)

## 2022-10-02 LAB — COMPREHENSIVE METABOLIC PANEL
ALT: 14 U/L (ref 0–44)
ALT: 14 U/L (ref 0–44)
AST: 15 U/L (ref 15–41)
AST: 25 U/L (ref 15–41)
Albumin: 2.4 g/dL — ABNORMAL LOW (ref 3.5–5.0)
Albumin: 2.9 g/dL — ABNORMAL LOW (ref 3.5–5.0)
Alkaline Phosphatase: 192 U/L — ABNORMAL HIGH (ref 38–126)
Alkaline Phosphatase: 204 U/L — ABNORMAL HIGH (ref 38–126)
Anion gap: 8 (ref 5–15)
Anion gap: 19 — ABNORMAL HIGH (ref 5–15)
BUN: 13 mg/dL (ref 8–23)
BUN: 16 mg/dL (ref 8–23)
CO2: 21 mmol/L — ABNORMAL LOW (ref 22–32)
CO2: 14 mmol/L — ABNORMAL LOW (ref 22–32)
Calcium: 8.6 mg/dL — ABNORMAL LOW (ref 8.9–10.3)
Calcium: 9.1 mg/dL (ref 8.9–10.3)
Chloride: 102 mmol/L (ref 98–111)
Chloride: 99 mmol/L (ref 98–111)
Creatinine, Ser: 0.82 mg/dL (ref 0.44–1.00)
Creatinine, Ser: 1.09 mg/dL — ABNORMAL HIGH (ref 0.44–1.00)
GFR, Estimated: >60 mL/min (ref >60)
GFR, Estimated: 55 mL/min — ABNORMAL LOW (ref >60)
Glucose, Bld: 102 mg/dL — ABNORMAL HIGH (ref 70–99)
Glucose, Bld: 133 mg/dL — ABNORMAL HIGH (ref 70–99)
Potassium: 4.5 mmol/L (ref 3.5–5.1)
Potassium: 4.4 mmol/L (ref 3.5–5.1)
Sodium: 131 mmol/L — ABNORMAL LOW (ref 135–145)
Sodium: 132 mmol/L — ABNORMAL LOW (ref 135–145)
Total Bilirubin: 0.3 mg/dL (ref 0.3–1.2)
Total Bilirubin: 0.5 mg/dL (ref 0.3–1.2)
Total Protein: 5.1 g/dL — ABNORMAL LOW (ref 6.5–8.1)
Total Protein: 6 g/dL — ABNORMAL LOW (ref 6.5–8.1)

## 2022-10-02 LAB — BLOOD GAS, ARTERIAL
Acid-base deficit: 12.2 mmol/L — ABNORMAL HIGH (ref 0.0–2.0)
Bicarbonate: 14.1 mmol/L — ABNORMAL LOW (ref 20.0–28.0)
Drawn by: 164
O2 Saturation: 99.7 %
Patient temperature: 37
pCO2 arterial: 33 mmHg (ref 32–48)
pH, Arterial: 7.24 — ABNORMAL LOW (ref 7.35–7.45)
pO2, Arterial: 295 mmHg — ABNORMAL HIGH (ref 83–108)

## 2022-10-02 LAB — MAGNESIUM
Magnesium: 1.6 mg/dL — ABNORMAL LOW (ref 1.7–2.4)
Magnesium: 2.2 mg/dL (ref 1.7–2.4)

## 2022-10-02 LAB — TROPONIN I (HIGH SENSITIVITY): Troponin I (High Sensitivity): 19 ng/L — ABNORMAL HIGH (ref <18)

## 2022-10-02 LAB — PHOSPHORUS
Phosphorus: 3.2 mg/dL (ref 2.5–4.6)
Phosphorus: 4.8 mg/dL — ABNORMAL HIGH (ref 2.5–4.6)

## 2022-10-02 MED ORDER — FUROSEMIDE 10 MG/ML IJ SOLN: 40.0000 mg | Freq: Once | INTRAMUSCULAR | Status: AC

## 2022-10-02 MED ORDER — MAGNESIUM OXIDE -MG SUPPLEMENT 400 (240 MG) MG PO TABS
400.0000 mg | ORAL_TABLET | Freq: Every day | ORAL | Status: DC
  Administered 2022-10-02 – 2022-10-04 (×3): 400 mg via ORAL
  Filled 2022-10-02 (×3): qty 1

## 2022-10-02 MED ORDER — MAGNESIUM SULFATE 2 GM/50ML IV SOLN
2.0000 g | Freq: Once | INTRAVENOUS | Status: AC
  Administered 2022-10-02: 2 g via INTRAVENOUS
  Filled 2022-10-02: qty 50

## 2022-10-02 MED ORDER — FUROSEMIDE 10 MG/ML IJ SOLN
INTRAMUSCULAR | Status: AC
  Administered 2022-10-02: 40 mg via INTRAVENOUS
  Filled 2022-10-02: qty 8

## 2022-10-02 NOTE — Progress Notes (Signed)
TRH night cross cover note:   Patient with reported episode of unresponsiveness associated with tonic-clonic activity  that resolved spontaneously, without pharmacologic intervention.  Subsequently somnolent, but gradually awakening, now opening her eyes to verbal stimuli.  Vital signs at this time include SBP in the 200s, sinus tach on the monitor w/ HR's in the 120s.  The following stat labs have been ordered, ABG, magnesium, troponin, CMP, CBC, lactic acid, phosphorus, CBG.  Additionally stat chest x-ray ordered along with EKG.  Per chart review, this is a 66 year old female admitted 3 days ago with nausea vomiting diarrhea, multiple electrolyte abnormalities, including hypomagnesemia, hypophosphatemia, along with concern for pneumonia ,bradycardia, hypotension, for which she was transferred into the ICU started on pressors.  Blood pressure subsequently improved, was off pressors, and was transferred out of the ICU to the hospitalist service on the morning of 10/01/2022.  She has a history of GAD on prn Xanax, chronic pain syndrome on prn opioids.  Per chart review, no prior history of seizures.  In addition to the above work-up, I have also touched base with the on-call neurologist, Dr. Leonel Ramsay, Who recommends MRI brain without contrast, EEG, and he will also see the patient.     Babs Bertin, DO Hospitalist

## 2022-10-02 NOTE — Code Documentation (Signed)
  Patient Name: Jamie [name]   MRN: [id number]   Date of Birth/ Sex: [birth year] , female      Admission Date: 09/28/2022  Attending Provider: Elodia Florence., *  Primary Diagnosis: Symptomatic bradycardia   Indication: Pt was in her usual state of health until this PM, when she was noted to be seizing and with acute respiratory failure. Code blue was subsequently called. At the time of arrival on scene, ACLS protocol was underway.   Technical Description:  - CPR performance duration:  1 minutes  - Was defibrillation or cardioversion used? No   - Was external pacer placed? No  - Was patient intubated pre/post CPR? No   Medications Administered: Y = Yes; Blank = No Amiodarone    Atropine    Calcium    Epinephrine    Lidocaine    Magnesium    Norepinephrine    Phenylephrine    Sodium bicarbonate    Vasopressin     Post CPR evaluation:  - Final Status - Was patient successfully resuscitated ? Yes - What is current rhythm? Sinus tach - What is current hemodynamic status? stable  Miscellaneous Information:  - Labs sent, including: ABG, cbc,CMP, lactic acid, Mg, phosphorus, troponin, chest XR  - Primary team notified?  Yes  - Family Notified? Yes  - Additional notes/ transfer status: Patient did not lose pulse; initially received BVM for ~1 min, then transitioned to non rebreather mask with 100% saturation. Patient was post ictal but slowly redirectable. Mental status improving.  PCCM was consulted to evaluate patient and deemed her stable to be on progressive unit. CXR revealed pulmonary edema; 1x 40mg  IV lasix given.      Romana Juniper, MD  10/02/2022, 9:53 PM

## 2022-10-02 NOTE — Consult Note (Signed)
Neurology Consultation Reason for Consult: Seizure Referring Physician: Arlean Hopping, J  CC: Seizure  History is obtained from: Patient, chart  HPI: Jamie [name] is a 66 y.o. female she was seen in July with new onset seizure with concern for possible Xanax or alcohol withdrawal.  There were multiple physiological stressors at the time, and therefore it was felt to be a provoked seizure and with negative EEG she has not started on antiepileptic medication.  Subsequent to this, she cut back drastically on her Xanax, and currently takes it about once a week.  She also has not been drinking regularly.  She was admitted to the hospital with severe bradycardia, required pressors.  Today, nursing received a call that she was tachycardic and on the response of the nursing training, she found her to be slumped to the side with gaze to the left.  She got her supervisor and by the time she arrived, she was having generalized convulsive activity which lasted for approximately a minute.  She subsequently had a postictal state, and then gradually improved.  Past Medical History:  Diagnosis Date   Anxiety    Arthritis    Chronic pain    Gastroenteritis    Headache    Hypotension    Insomnia    Suicidal ideation      Family History  Problem Relation Age of Onset   Colon cancer Neg Hx      Social History:  reports that she has never smoked. She has never used smokeless tobacco. She reports that she does not currently use alcohol. She reports that she does not use drugs.   Exam: Current vital signs: BP 138/84 (BP Location: Right Arm)   Pulse 100   Temp 98 F (36.7 C) (Oral)   Resp 16   Ht 5\' 5"  (1.651 m)   Wt 64.4 kg   SpO2 91%   BMI 23.63 kg/m  Vital signs in last 24 hours: Temp:  [98 F (36.7 C)-98.1 F (36.7 C)] 98 F (36.7 C) (10/08 1909) Pulse Rate:  [81-136] 100 (10/08 2214) Resp:  [15-19] 16 (10/08 2214) BP: (138-206)/(84-120) 138/84 (10/08 2214) SpO2:  [91 %-99 %] 91 %  (10/08 2214)   Physical Exam  Constitutional: Appears well-developed and well-nourished.   Neuro: Mental Status: Patient is awake, alert, oriented to person, place, month, year, and situation. Patient is able to give a clear and coherent history. No signs of aphasia or neglect Cranial Nerves: II: Visual Fields are full. Pupils are equal, round, and reactive to light.   III,IV, VI: EOMI without ptosis or diploplia.  V: Facial sensation is symmetric to temperature VII: Facial movement is symmetric.  VIII: hearing is intact to voice X: Uvula elevates symmetrically XI: Shoulder shrug is symmetric. XII: tongue is midline without atrophy or fasciculations.  Motor: Tone is normal. Bulk is normal. 5/5 strength was present in all four extremities.  Sensory: Sensation is symmetric to light touch and temperature in the arms and legs. Deep Tendon Reflexes: 2+ and symmetric in the biceps and patellae.  Plantars: Toes are downgoing bilaterally.  Cerebellar: FNF intact bilaterally      I have reviewed labs in epic and the results pertinent to this consultation are: Magnesium 2.2 Sodium 132 Calcium 9.1  I have reviewed the images obtained: CT head from 10/5-negative other than chronic atrophy  Impression: 66 year old female with second lifetime seizure.  She is no longer drinking or taking Xanax in an amount that I would expect to cause  withdrawal.  Her electrolyte abnormality seen earlier in the day had been corrected.  Finally, there was a focal finding of gaze deviation prior to secondary generalization, suggestive of a focal onset of the seizure.  Given this constellation of symptoms, I would consider this an unprovoked seizure, with her history of a previous seizure I think I would favor starting antiepileptic therapy at this time.  Recommendations: 1) MRI brain 2) EEG 3) Keppra 500 mg twice daily   Roland Rack, MD [facility] [phone]  If 7pm- 7am,  please page neurology on call as listed in [place].

## 2022-10-02 NOTE — Progress Notes (Signed)
   10/02/22 2105  Clinical Encounter Type  Visited With Patient not available  Visit Type Initial;Code  Referral From Nurse  Consult/Referral To Chaplain   Chaplain responded to a code blue. Patient was under the care of the medical team. No family is present. If a chaplain is requested someone will respond.   Danice Goltz [facility]  [phone]

## 2022-10-02 NOTE — Progress Notes (Signed)
2059 Maggie, RN requesting assistance with patient after being notified by CCMD at approx 2058 for pt's heart rate 160s. Upon entering room to assist pt noted to be unresponsive and having seizure like activity with agonal breathing,  oxygen sats 50s, tachycardia with pulse present and hypertensive.  Initiated code blue and began bag mask ventilations, pt placed on cpr board and attached to zoll.  Code team to bedside, attending and pt dtr notified.  Upon pt becoming responsive to opening eyes and nodding head, slow to verbally communicate.  Pt currently alert and oriented X 4, has diuresed close to 2L after IV lasix given per orders.  Will transport to MRI per orders when MRI available and pt able to lie flat without respiratory difficulty.

## 2022-10-02 NOTE — Significant Event (Addendum)
Rapid Response Event Note   Reason for Call :  Pt had seizure and began to breathe agonally with SpO2-50% on RA with palpable pulse.   Prior to calling RRT, RN had initiated Code Blue for respiratory arrest and had begun to bag patient.   Initial Focused Assessment:  Pt lying in bed with eyes open, completely unresponsive to any stimulation. Pt being bagged via on 100% FiO2, SpO2-100%. Breathing labored. Lungs with crackles. After a few minutes, pt began to blink eyes. Pt was then placed on NRB with SpO2 remaining at 100%. Breathing was still labored at this time. Pupils 4, equal, and sluggish.   BP-206/120, HR-136, RR-40, SpO2-100% on NRB.  Pt began to respond after a few minutes. She will follow commands, move all extremities, but will not yet speak.   Interventions:  CBG-171 ABG-7.24/33/295/14.1 CMP, MG, Phos CBCD LA Trop PCXR-Shallow inspiration with atelectasis or infiltration in the lung bases, progressing since prior study. Lasix 40mg  IV MRI stat Plan of Care:  Pt still post-ictal but is returning to baseline slowly. Breathing is better. Give lasix and monitor response. MRI ordered, however, I'm not sure pt will hold still for this test at this time. Continue to monitor pt closely. Call RRT if further assistance needed.   Event Summary:   MD Notified: Dr. Velia Meyer notified Call Time:2101 Arrival Time:2102 End Time:2150  Dillard Essex, RN

## 2022-10-02 NOTE — Progress Notes (Signed)
VASCULAR LAB    Bilateral lower extremity venous duplex has been performed.  See CV proc for preliminary results.   Nocole Zammit, RVT 10/02/2022, 3:52 PM

## 2022-10-02 NOTE — Progress Notes (Signed)
PROGRESS NOTE    Jamie [name]  TIR:[id number] DOB: 20-Jun-1956 DOA: 09/28/2022 PCP: Christain Sacramento, MD  Chief Complaint  Patient presents with   Bradycardia   Weakness    Brief Narrative:  66 yo with hx chronic pain, anxiety, IBS, hypotension who presented with weakness and fatigue after 3 weeks of nausea, vomiting, and diarrhea. She was was found to have low heart rate into the 30s.  Patient also was hypotensive into the low 70s.  Patient also had significant electrolyte abnormalities.  Patient admitted to ICU for further work-up and treatment of symptomatic bradycardia with hypotension.   Assessment & Plan:   Principal Problem:   Symptomatic bradycardia Active Problems:   Shock circulatory (HCC)   Malnutrition of moderate degree   Hypotension   Assessment and Plan: #Symptomatic bradycardia, resolving - rate improved at this time - limited echo with EF 64%, grade II diastolic dysfunction  - TSH not suggestive of significant hypothyroidism (repeat outpatient) - improved at this time, suspected related to electrolyte abnormalities at presentation?   #Hypotension, resolving - now off pressors -Likely in the setting of bradycardia -Continue to monitor blood pressure -Avoid any blood pressure dropping medicines -Hold is systolic greater than 90   #Left pneumonia #Leukocytosis, secondary to above, resolving CXR 10/6 with persistent ill defined opacity within the left lung base (concern for atelectassi or pneumonia) CT abd/pelvis with moderate L and small R effusions Pro-Cal up to 4.58  Will treat empirically with ceftriaxone and doxycycline Blood cx NGTD   #Hypokalemia, resolved #Hypomagnesemia, resolved #Hypophosphatemia, resolving - improved   #Decubitus ulcer -Patient does have wound to lower back.  This is tiny, with some eschar around it.  Wound care following. -Continue wound care   #Chronic pain Patient is complaining of chronic back pain and knee  pain -Continue home oxycodone 10 mg every 6 hours as needed   #Anxiety/depression -Resume home Xanax 0.5 mg 3 times daily (half dose)  # Moderate Malnutrition - continue RD  # Pressure Ulcer Pressure Injury 09/29/22 Sacrum Mid Unstageable - Full thickness tissue loss in which the base of the injury is covered by slough (yellow, tan, gray, green or brown) and/or eschar (tan, brown or black) in the wound bed. 2x1.5cm irregular eschar [name] (Active)  09/29/22 0919  Location: Sacrum  Location Orientation: Mid  Staging: Unstageable - Full thickness tissue loss in which the base of the injury is covered by slough (yellow, tan, gray, green or brown) and/or eschar (tan, brown or black) in the wound bed.  Wound Description (Comments): 2x1.5cm irregular eschar (unstageable) surrounded by stage II  Present on Admission: Yes   Therapy recommending SNF placement, will follow     DVT prophylaxis: heparin Code Status: full Family Communication: none Disposition:   Status is: Inpatient Remains inpatient appropriate because: noen   Consultants:  Pccm  Procedures:  Echo IMPRESSIONS     1. Left ventricular ejection fraction by 3D volume is 64 %. Left  ventricular diastolic parameters are consistent with Grade II diastolic  dysfunction (pseudonormalization).   2. Right ventricular systolic function is normal. The right ventricular  size is normal. There is mildly elevated pulmonary artery systolic  pressure. The estimated right ventricular systolic pressure is 67.6 mmHg.   3. Left atrial size was severely dilated.   4. Right atrial size was mildly dilated.   5. The mitral valve is grossly normal. Trivial mitral valve  regurgitation. No evidence of mitral stenosis.   6. The aortic valve is  tricuspid. Aortic valve regurgitation is trivial.  No aortic stenosis is present.   7. The inferior vena cava is normal in size with greater than 50%  respiratory variability, suggesting right  atrial pressure of 3 mmHg.   Antimicrobials:  Anti-infectives (From admission, onward)    Start     Dose/Rate Route Frequency Ordered Stop   09/30/22 2200  doxycycline (VIBRA-TABS) tablet 100 mg        100 mg Oral Every 12 hours 09/30/22 1150     09/30/22 0100  cefTRIAXone (ROCEPHIN) 2 g in sodium chloride 0.9 % 100 mL IVPB        2 g 200 mL/hr over 30 Minutes Intravenous Every 24 hours 09/29/22 0853 10/04/22 0059   09/29/22 1000  doxycycline (VIBRAMYCIN) 100 mg in sodium chloride 0.9 % 250 mL IVPB  Status:  Discontinued        100 mg 125 mL/hr over 120 Minutes Intravenous Every 12 hours 09/29/22 0902 09/30/22 1150   09/29/22 0415  doxycycline (VIBRAMYCIN) 100 mg in sodium chloride 0.9 % 250 mL IVPB  Status:  Discontinued        100 mg 125 mL/hr over 120 Minutes Intravenous Every 12 hours 09/29/22 0400 09/29/22 0902   09/29/22 0130  cefTRIAXone (ROCEPHIN) 2 g in sodium chloride 0.9 % 100 mL IVPB        2 g 200 mL/hr over 30 Minutes Intravenous  Once 09/29/22 0116 09/29/22 0203       Subjective: No new complaints  Objective: Vitals:   10/01/22 1939 10/02/22 0410 10/02/22 0744 10/02/22 0840  BP: (!) 150/89 (!) 150/89    Pulse: 85 81 91 82  Resp: 14 15 16 15   Temp: 98 F (36.7 C) 98 F (36.7 C)    TempSrc: Oral Oral    SpO2: 98% 98% 99% 98%  Weight:      Height:        Intake/Output Summary (Last 24 hours) at 10/02/2022 1529 Last data filed at 10/02/2022 0912 Gross per 24 hour  Intake 508 ml  Output 650 ml  Net -142 ml   Filed Weights   09/29/22 0001 10/01/22 0456  Weight: 52 kg 64.4 kg    Examination:  General: No acute distress. Sitting up in chair. Cardiovascular: RRR Lungs: unlabored Abdomen: Soft, nontender, nondistended Neurological: Alert and oriented 3. Moves all extremities 4 with equal strength. Cranial nerves II through XII grossly intact. Extremities: No clubbing or cyanosis. No edema.   Data Reviewed: I have personally reviewed following labs  and imaging studies  CBC: Recent Labs  Lab 09/29/22 0024 09/29/22 0422 09/30/22 0712 10/01/22 0137 10/02/22 0218  WBC 12.3* 13.4* 11.5* 13.9* 11.9*  NEUTROABS  --   --  10.1* 9.6* 7.8*  HGB 12.3 12.5 9.7* 10.5* 10.8*  HCT 40.4 38.7 29.4* 32.4* 33.1*  MCV 90.2 88.2 84.7 85.9 85.5  PLT 494* 502* 386 498* 489*    Basic Metabolic Panel: Recent Labs  Lab 09/29/22 0422 09/29/22 1134 09/30/22 0712 10/01/22 0137 10/02/22 0218  NA 136 133* 132* 133* 131*  K 3.0* 3.7 5.0 4.7 4.5  CL 105 106 106 106 102  CO2 11* 16* 16* 19* 21*  GLUCOSE 135* 192* 115* 100* 102*  BUN 12 11 10 8 13   CREATININE 0.82 0.97 0.64 0.82 0.82  CALCIUM 8.6* 8.5* 8.9 8.9 8.6*  MG 1.6*  --  1.9  --  1.6*  PHOS 2.4*  --  1.7*  --  3.2  GFR: Estimated Creatinine Clearance: 59.1 mL/min (by C-G formula based on SCr of 0.82 mg/dL).  Liver Function Tests: Recent Labs  Lab 09/29/22 0024 10/02/22 0218  AST 16 15  ALT 10 14  ALKPHOS 144* 192*  BILITOT 0.9 0.3  PROT 5.8* 5.1*  ALBUMIN 2.8* 2.4*    CBG: Recent Labs  Lab 10/01/22 1153 10/01/22 1726 10/01/22 2140 10/02/22 0835 10/02/22 1136  GLUCAP 108* 122* 119* 95 111*     Recent Results (from the past 240 hour(s))  Culture, blood (Routine X 2) w Reflex to ID Panel     Status: None (Preliminary result)   Collection Time: 09/29/22 12:24 AM   Specimen: BLOOD LEFT WRIST  Result Value Ref Range Status   Specimen Description BLOOD LEFT WRIST  Final   Special Requests   Final    BOTTLES DRAWN AEROBIC AND ANAEROBIC Blood Culture adequate volume   Culture   Final    NO GROWTH 3 DAYS Performed at [facility], [address], [place], [place] [address]    Report Status PENDING  Incomplete  Culture, blood (Routine X 2) w Reflex to ID Panel     Status: None (Preliminary result)   Collection Time: 09/29/22 12:40 AM   Specimen: BLOOD RIGHT HAND  Result Value Ref Range Status   Specimen Description BLOOD RIGHT HAND  Final   Special Requests    Final    BOTTLES DRAWN AEROBIC AND ANAEROBIC Blood Culture adequate volume   Culture   Final    NO GROWTH 3 DAYS Performed at [facility], [address], [place], [place] [address]    Report Status PENDING  Incomplete  MRSA Next Gen by PCR, Nasal     Status: None   Collection Time: 09/29/22  5:59 AM   Specimen: Nasal Mucosa; Nasal Swab  Result Value Ref Range Status   MRSA by PCR Next Gen NOT DETECTED NOT DETECTED Final    Comment: (NOTE) The GeneXpert MRSA Assay (FDA approved for NASAL specimens only), is one component of [facility] comprehensive MRSA colonization surveillance program. It is not intended to diagnose MRSA infection nor to guide or monitor treatment for MRSA infections. Test performance is not FDA approved in patients less than 36 years old. Performed at [facility], [address], [place], [place] [address]          Radiology Studies: No results found.      Scheduled Meds:  acetaminophen  1,000 mg Oral q12n4p   diclofenac Sodium  2 g Topical QID   doxycycline  100 mg Oral Q12H   feeding supplement  1 Container Oral TID BM   fentaNYL (SUBLIMAZE) injection  25 mcg Intravenous Once   Gerhardt's butt cream   Topical BID   heparin  5,000 Units Subcutaneous Q8H   insulin aspart  0-9 Units Subcutaneous TID WC   lidocaine  1 patch Transdermal Q24H   magnesium oxide  400 mg Oral Daily   multivitamin with minerals  1 tablet Oral Daily   pantoprazole  40 mg Oral Daily   thiamine  100 mg Oral Daily   Continuous Infusions:  cefTRIAXone (ROCEPHIN)  IV 2 g (10/02/22 0057)     LOS: 3 days    Time spent: over 30 min    Lacretia Nicks, MD [facility]   To contact the attending provider between 7A-7P or the covering provider during after hours 7P-7A, please log into the web site [email or web address] and access using  universal [place] password for that web site. If you do not have the password, please call the hospital operator.  10/02/2022,  3:29 PM

## 2022-10-03 ENCOUNTER — Inpatient Hospital Stay (HOSPITAL_COMMUNITY): Payer: Medicare HMO

## 2022-10-03 LAB — CBC WITH DIFFERENTIAL/PLATELET
Abs Immature Granulocytes: 0.15 10*3/uL — ABNORMAL HIGH (ref 0.00–0.07)
Basophils Absolute: 0.1 10*3/uL (ref 0.0–0.1)
Basophils Relative: 1 %
Eosinophils Absolute: 0.3 10*3/uL (ref 0.0–0.5)
Eosinophils Relative: 1 %
HCT: 37.3 % (ref 36.0–46.0)
Hemoglobin: 11.9 g/dL — ABNORMAL LOW (ref 12.0–15.0)
Immature Granulocytes: 1 %
Lymphocytes Relative: 13 %
Lymphs Abs: 2.4 10*3/uL (ref 0.7–4.0)
MCH: 27.4 pg (ref 26.0–34.0)
MCHC: 31.9 g/dL (ref 30.0–36.0)
MCV: 85.9 fL (ref 80.0–100.0)
Monocytes Absolute: 1.3 10*3/uL — ABNORMAL HIGH (ref 0.1–1.0)
Monocytes Relative: 7 %
Neutro Abs: 13.5 10*3/uL — ABNORMAL HIGH (ref 1.7–7.7)
Neutrophils Relative %: 77 %
Platelets: 574 10*3/uL — ABNORMAL HIGH (ref 150–400)
RBC: 4.34 MIL/uL (ref 3.87–5.11)
RDW: 15.4 % (ref 11.5–15.5)
WBC: 17.6 10*3/uL — ABNORMAL HIGH (ref 4.0–10.5)
nRBC: 0 % (ref 0.0–0.2)

## 2022-10-03 LAB — CREATININE, URINE, RANDOM: Creatinine, Urine: 27 mg/dL

## 2022-10-03 LAB — PHOSPHORUS: Phosphorus: 3.7 mg/dL (ref 2.5–4.6)

## 2022-10-03 LAB — COMPREHENSIVE METABOLIC PANEL
ALT: 12 U/L (ref 0–44)
AST: 16 U/L (ref 15–41)
Albumin: 2.6 g/dL — ABNORMAL LOW (ref 3.5–5.0)
Alkaline Phosphatase: 167 U/L — ABNORMAL HIGH (ref 38–126)
Anion gap: 10 (ref 5–15)
BUN: 15 mg/dL (ref 8–23)
CO2: 26 mmol/L (ref 22–32)
Calcium: 8.7 mg/dL — ABNORMAL LOW (ref 8.9–10.3)
Chloride: 97 mmol/L — ABNORMAL LOW (ref 98–111)
Creatinine, Ser: 0.89 mg/dL (ref 0.44–1.00)
GFR, Estimated: >60 mL/min (ref >60)
Glucose, Bld: 112 mg/dL — ABNORMAL HIGH (ref 70–99)
Potassium: 4.2 mmol/L (ref 3.5–5.1)
Sodium: 133 mmol/L — ABNORMAL LOW (ref 135–145)
Total Bilirubin: 0.4 mg/dL (ref 0.3–1.2)
Total Protein: 5.8 g/dL — ABNORMAL LOW (ref 6.5–8.1)

## 2022-10-03 LAB — URINALYSIS, COMPLETE (UACMP) WITH MICROSCOPIC
Bacteria, UA: NONE SEEN
Bilirubin Urine: NEGATIVE
Glucose, UA: NEGATIVE mg/dL
Hgb urine dipstick: NEGATIVE
Ketones, ur: NEGATIVE mg/dL
Leukocytes,Ua: NEGATIVE
Nitrite: NEGATIVE
Protein, ur: NEGATIVE mg/dL
Specific Gravity, Urine: 1.01 (ref 1.005–1.030)
pH: 7 (ref 5.0–8.0)

## 2022-10-03 LAB — VITAMIN A: Vitamin A (Retinoic Acid): 20.9 ug/dL — ABNORMAL LOW (ref 22.0–69.5)

## 2022-10-03 LAB — PROCALCITONIN: Procalcitonin: 0.12 ng/mL

## 2022-10-03 LAB — SODIUM, URINE, RANDOM: Sodium, Ur: 119 mmol/L

## 2022-10-03 LAB — PROTIME-INR
INR: 1 (ref 0.8–1.2)
Prothrombin Time: 12.6 seconds (ref 11.4–15.2)

## 2022-10-03 LAB — BRAIN NATRIURETIC PEPTIDE: B Natriuretic Peptide: 1280.2 pg/mL — ABNORMAL HIGH (ref 0.0–100.0)

## 2022-10-03 LAB — LACTIC ACID, PLASMA: Lactic Acid, Venous: 1.8 mmol/L (ref 0.5–1.9)

## 2022-10-03 LAB — MAGNESIUM: Magnesium: 1.8 mg/dL (ref 1.7–2.4)

## 2022-10-03 LAB — GLUCOSE, CAPILLARY
Glucose-Capillary: 103 mg/dL — ABNORMAL HIGH (ref 70–99)
Glucose-Capillary: 113 mg/dL — ABNORMAL HIGH (ref 70–99)
Glucose-Capillary: 117 mg/dL — ABNORMAL HIGH (ref 70–99)
Glucose-Capillary: 128 mg/dL — ABNORMAL HIGH (ref 70–99)

## 2022-10-03 MED ORDER — SODIUM CHLORIDE 0.9 % IV SOLN
2.0000 g | INTRAVENOUS | Status: DC
  Administered 2022-10-03: 2 g via INTRAVENOUS
  Filled 2022-10-03 (×2): qty 20

## 2022-10-03 MED ORDER — DOXYCYCLINE HYCLATE 100 MG PO TABS
100.0000 mg | ORAL_TABLET | Freq: Once | ORAL | Status: AC
  Administered 2022-10-03: 100 mg via ORAL
  Filled 2022-10-03: qty 1

## 2022-10-03 MED ORDER — LEVETIRACETAM 500 MG PO TABS
500.0000 mg | ORAL_TABLET | Freq: Two times a day (BID) | ORAL | Status: DC
  Administered 2022-10-03 – 2022-10-04 (×3): 500 mg via ORAL
  Filled 2022-10-03 (×3): qty 1

## 2022-10-03 MED ORDER — FUROSEMIDE 40 MG PO TABS
40.0000 mg | ORAL_TABLET | Freq: Every day | ORAL | Status: DC
  Administered 2022-10-03 – 2022-10-04 (×2): 40 mg via ORAL
  Filled 2022-10-03 (×2): qty 1

## 2022-10-03 NOTE — Progress Notes (Signed)
Physical Therapy Treatment Patient Details Name: Jamie [name] MRN: [id number] DOB: [birth year] Today's Date: 10/03/2022   History of Present Illness pt is a 66 y/o female admitted 10/4 with 3 weeks of nausea, vomiting and diarrhea leading to progressive weakness before finally calling EMS due to not being able to get off the couch .  Marland Kitchen PMH: arthralgia, headaches, chronic pain syndrome with prior back surgery, chronic opiate use, insomnia on bezodiazepine, alcohol abuse, hx of noncompliance. On 10/8 pt experienced episode of unresponsiveness with tonic-clonic activity that resolved spontaneously without pharmacological intervention. MRI was negative for acute intracranial abnormality.    PT Comments    Pt seen for PT tx with pt received asleep but awakened & agreeable to tx. Pt endorses chronic pain all over, HA & pain 2/2 wound on her buttocks. Pt also endorses fatigue 2/2 eventful night last night. Pt is able to complete supine>sit with supervision, sit>supine with mod I with use of bed rails PRN. Throughout session pt completes STS with CGA<>supervision and stand pivot with CGA<>supervision with RW & without AD. Pt agreeable to short distance gait, taking a few steps forwards & backwards with RW & CGA. Pt notes she can likely ambulate across the room but declines attempts today 2/2 fatigue. Pt engages in STS x 4 reps for BLE/global strengthening. Pt politely declines further participation on this date & requests to return to bed; PT encouraged pt to sit in recliner for dinner. Pt reports she feels she can d/c home vs going to rehab & this PT agrees, d/c recommendations have been updated to reflect this.  Pt does c/o dizziness/lightheadedness upon standing but BP in RUE sitting after standing: 124/71 mmHg MAP 88.    Recommendations for follow up therapy are one component of a multi-disciplinary discharge planning process, led by the attending physician.  Recommendations may be updated based on patient  status, additional functional criteria and insurance authorization.  Follow Up Recommendations  Home health PT Can patient physically be transported by private vehicle: Yes   Assistance Recommended at Discharge Intermittent Supervision/Assistance  Patient can return home with the following A little help with walking and/or transfers;A little help with bathing/dressing/bathroom;Assistance with cooking/housework;Assist for transportation;Help with stairs or ramp for entrance   Equipment Recommendations  BSC/3in1    Recommendations for Other Services       Precautions / Restrictions Precautions Precautions: Fall Restrictions Weight Bearing Restrictions: No     Mobility  Bed Mobility Overal bed mobility: Needs Assistance Bed Mobility: Supine to Sit, Sit to Supine     Supine to sit: Supervision, HOB elevated (use of bed rails) Sit to supine: Modified independent (Device/Increase time)        Transfers Overall transfer level: Needs assistance Equipment used: None, Rolling walker (2 wheels) Transfers: Sit to/from Stand, Bed to chair/wheelchair/BSC Sit to Stand: Min guard, Supervision   Step pivot transfers: Min guard (with RW then without AD)            Ambulation/Gait Ambulation/Gait assistance: Min guard Gait Distance (Feet):  (3 ft forwards + 3 ft backwards) Assistive device: Rolling walker (2 wheels) Gait Pattern/deviations: Decreased step length - right, Decreased step length - left, Decreased stride length, Trunk flexed Gait velocity: decreased         Stairs             Wheelchair Mobility    Modified Rankin (Stroke Patients Only)       Balance Overall balance assessment: Needs assistance Sitting-balance support: Feet supported,  No upper extremity supported Sitting balance-Leahy Scale: Fair Sitting balance - Comments: supervision static sitting EOB   Standing balance support: During functional activity, Bilateral upper extremity  supported Standing balance-Leahy Scale: Fair                              Cognition Arousal/Alertness: Awake/alert Behavior During Therapy: Flat affect (pt is somewhat self limiting during session) Overall Cognitive Status: No family/caregiver present to determine baseline cognitive functioning                                 General Comments: follows simple commands throughout session        Exercises Other Exercises Other Exercises: Pt attempted 5x STS with BUE support with cuing for hand placement & increased eccentric control with focus on BLE/global strengthening with pt only performing 4 repetitions before endorsing fatigue & declining more attemtps.    General Comments General comments (skin integrity, edema, etc.): Pt on room air with SPO2 >90%      Pertinent Vitals/Pain Pain Assessment Pain Assessment: Faces Faces Pain Scale: Hurts even more Pain Location: buttocks 2/2 wound, HA, hurts "all over" Pain Descriptors / Indicators: Discomfort, Aching Pain Intervention(s): Premedicated before session, Monitored during session, Repositioned, Limited activity within patient's tolerance (pt reports she's already received pain meds)    Home Living                          Prior Function            PT Goals (current goals can now be found in the care plan section) Acute Rehab PT Goals Patient Stated Goal: ultimately get home PT Goal Formulation: With patient Time For Goal Achievement: 10/14/22 Potential to Achieve Goals: Good Progress towards PT goals: Progressing toward goals    Frequency    Min 3X/week      PT Plan Discharge plan needs to be updated    Co-evaluation              AM-PAC PT "6 Clicks" Mobility   Outcome Measure  Help needed turning from your back to your side while in a flat bed without using bedrails?: None Help needed moving from lying on your back to sitting on the side of a flat bed without using  bedrails?: A Little Help needed moving to and from a bed to a chair (including a wheelchair)?: A Little Help needed standing up from a chair using your arms (e.g., wheelchair or bedside chair)?: A Little Help needed to walk in hospital room?: A Little Help needed climbing 3-5 steps with a railing? : A Lot 6 Click Score: 18    End of Session Equipment Utilized During Treatment: Gait belt Activity Tolerance: Patient limited by fatigue (pt self limiting, endorsing fatigue following last night) Patient left: in bed;with call bell/phone within reach;with bed alarm set Nurse Communication: Mobility status (c/o pain & dizziness during session) PT Visit Diagnosis: Other abnormalities of gait and mobility (R26.89);Pain;Muscle weakness (generalized) (M62.81) Pain - part of body:  (buttocks)     Time: 1410-1434 PT Time Calculation (min) (ACUTE ONLY): 24 min  Charges:  $Therapeutic Activity: 23-37 mins                     Lavone Nian, PT, DPT 10/03/22, 2:48 PM  Waunita Schooner 10/03/2022,  2:45 PM

## 2022-10-03 NOTE — Progress Notes (Signed)
TRH night cross cover note:  Review of interval results:  CBG 171.  ABG demonstrates metabolic acidemia with partial compensatory respiratory alkalosis.  Troponin 19 similar to most recent prior value of 18 on 09/29/2022.  CMP notable for sodium 132 compared to 131 on the morning of 10/8/202, potassium 4.3, bicarbonate 14, anion gap 19, BUN 16, creatinine 1.09 compared to 0.82 on the morning of 10/02/2022, glucose 133, adjusted calcium 9.9, albumin 2.9, alkaline phosphatase 204 compared to 192 on the morning of 10/02/2022, otherwise, liver enzymes within normal limits.  CBC notable for white cell count 21,000, relative to 11,900 on the morning of 10/02/2022, hemoglobin 13.7, platelets 774 compared to 489 on the morning of 10/02/2022. LA > 9.0 (was 1.1 on 09/29/22).   CXR, compared to CXR on 09/30/2022, shows continued bibasilar airspace opacities suggestive of atelectasis versus infiltrate, with slight interval progression of right basilar opacity, stable on left, and no evidence of pulmonary edema, pleural effusion, or pneumothorax.    Updated assessment and plan:  #) Seizure: Appears to represent new diagnosis for patient.  Etiology unclear at this time. While less likely , various pharmacologic possibilities were considered, including relative benzodiazepine withdrawal, noting that patient is on scheduled Xanax 1 mg p.o. every 8 hours as an outpatient, receiving reduced dose of 0.5 mg p.o. every 8 hours as needed in the hospital.  Has received a total of 1 mg Xanax over the preceding 48 hours.  Also considered possibility of contribution from relative opioid withdrawal noting the patient is on a total of 40 mg scheduled oxycodone q day as an outpatient. Otherwise, per chart review, no obvious medications associated with diminishing seizure threshold. no overt electrolyte contributions, noting stable mild hyponatremia, along with interval resolution of previous mild hypomagnesemia.  No evidence of  hypoglycemia. Case d\w neuro. Per their preliminary recs will pursue MRI brain, EEG.   Plan: MRI brain without contrast, EEG monitoring.  Seizure precautions.  Accu-Cheks as ordered.  Repeat CMP and serum magnesium levels in the morning.  Serial neurochecks.  N.p.o. for now.     #) Anion gap metabolic acidosis with ABG reflecting metabolic acidemia with partial compensatory respiratory alkalosis.  Strong contribution from elevated lactate, with associated elevation appearing consistent with episode of seizure this evening.  Patient with known underlying infection in the form community-acquired pneumonia with very mild interval increase in right basilar airspace opacity compared to 2 days ago as well as stable left basilar infiltrate, but otherwise no evidence of acute/interval cardiopulmonary process, including no evidence of new infiltrate .  Overall, no new evidence of additional infectious source at this time.  will check urinalysis to further evaluate and repeat procalcitonin, noting significant elevation in most recent prior value. Suspect contribution towards interval increase in white blood cell count relative to pre-existing leukocytosis from inflammatory response to interval seizure episode, particular given corresponding interval increase in platelet count.   Other potential secondary contributions towards elevated lactate/anion gap metabolic acidosis include mild interval decline in renal function as well as elevated blood pressure at the time of the patient's seizure episode,which may also contributed to a transient relative decline in generalized perfusion.   Plan: Further evaluation and management of Seizure, as above.  Check urinalysis with microscopy, procalcitonin, INR.  Continue existing IV antibiotics for community-acquired pneumonia coverage.  Repeat CMP/CBC in the morning.  Repeat lactate in the morning. Add-on BNP. Incentive spirometry.        #) Elevated creatinine: Most  recent creatinine 1.09 compared  to 0.82 on the morning of 10/02/2022, with this interval increase not quite meeting quantitative threshold for acute kidney injury.  Suspect prerenal contribution from transient relative decline in renal perfusion as result of aforementioned hypertensive blood pressures that occurred at time of the seizure episode.   Plan: Check urinalysis with microscopy.  Add-on random urine Na and random urine creatinine.  Repeat CMP in the morning.  Monitor strict I's and O's and daily weights.  Attempt to avoid nephrotoxic agents.    Newton Pigg, DO Hospitalist

## 2022-10-03 NOTE — Progress Notes (Signed)
   10/02/22 2106  Assess: MEWS Score  Temp 98 F (36.7 C)  BP (!) 206/120  MAP (mmHg) 143  Pulse Rate (!) 136  Level of Consciousness Unresponsive  SpO2 97 %  O2 Device Bag-valve Mask  Assess: MEWS Score  MEWS Temp 0  MEWS Systolic 2  MEWS Pulse 3  MEWS RR 0  MEWS LOC 3  MEWS Score 8  MEWS Score Color Red  Assess: if the MEWS score is Yellow or Red  Were vital signs taken at a resting state? Yes  Focused Assessment Change from prior assessment (see assessment flowsheet)  Does the patient meet 2 or more of the SIRS criteria? No  MEWS guidelines implemented *See Row Information* Yes  Treat  MEWS Interventions Other (Comment) (code d/t to agonal resp)  Take Vital Signs  Increase Vital Sign Frequency  Red: Q 1hr X 4 then Q 4hr X 4, if remains red, continue Q 4hrs  Escalate  MEWS: Escalate Red: discuss with charge nurse/RN and provider, consider discussing with RRT  Notify: Charge Nurse/RN  Name of Charge Nurse/RN Notified Glenna Durand  Date Charge Nurse/RN Notified 10/02/22  Time Charge Nurse/RN Notified 2106  Notify: Provider  Provider Name/Title [place]  Date Provider Notified 10/02/22  Method of Notification Page  Notification Reason Change in status  Provider response See new orders  Date of Provider Response 10/02/22  Notify: Rapid Response  Name of Rapid Response RN Notified Mindy  Date Rapid Response Notified 10/02/22  Time Rapid Response Notified 2100  Document  Patient Outcome Stabilized after interventions  Progress note created (see row info) Yes  Assess: SIRS CRITERIA  SIRS Temperature  0  SIRS Pulse 1  SIRS Respirations  0  SIRS WBC 1  SIRS Score Sum  2

## 2022-10-03 NOTE — Progress Notes (Signed)
EEG complete - results pending 

## 2022-10-03 NOTE — Consult Note (Signed)
WOC will not follow at this time; should wounds deteriorate (POA wounds) please re-consult Thanks  [place], [place], [place]

## 2022-10-03 NOTE — Care Management Important Message (Signed)
Important Message  Patient Details  Name: Jamie [name] MRN: [id number] Date of Birth: [birth year]   Medicare Important Message Given:  Yes     Shelda Altes 10/03/2022, 9:41 AM

## 2022-10-03 NOTE — Procedures (Signed)
Patient Name: Jamie [name]  MRN: [id number]  Epilepsy Attending: Lora Havens  Referring Physician/Provider: Rhetta Mura, DO Date: 10/03/2022 Duration: 23.45 mins  Patient history:  66 y.o. female she was seen in July with new onset seizure with concern for possible Xanax or alcohol withdrawal. Yesterday nursing received a call that she was tachycardic and on the response of the nursing training, she found her to be slumped to the side with gaze to the left.  She got her supervisor and by the time she arrived, she was having generalized convulsive activity which lasted for approximately a minute.  She subsequently had a postictal state, and then gradually improved.EEG to evaluate for seizure  Level of alertness: Awake, asleep  AEDs during EEG study: LEV, Xanax  Technical aspects: This EEG study was done with scalp electrodes positioned according to the 10-20 International system of electrode placement. Electrical activity was reviewed with band pass filter of 1-70Hz , sensitivity of 7 uV/mm, display speed of 36mm/sec with a 60Hz  notched filter applied as appropriate. EEG data were recorded continuously and digitally stored.  Video monitoring was available and reviewed as appropriate.  Description: The posterior dominant rhythm consists of 8-9 Hz activity of moderate voltage (25-35 uV) seen predominantly in posterior head regions, symmetric and reactive to eye opening and eye closing. Sleep was characterized by vertex waves, sleep spindles (12 to 14 Hz), maximal frontocentral region. Physiologic photic driving was seen during photic stimulation.  Hyperventilation was not performed.     IMPRESSION: This study is within normal limits. No seizures or epileptiform discharges were seen throughout the recording.  A normal interictal EEG does not exclude the diagnosis of epilepsy.  [name]

## 2022-10-03 NOTE — Progress Notes (Signed)
Neurology Progress Note  Patient ID: Jamie [name] is a 66 y.o. with PMHx of  has a past medical history of Anxiety, Arthritis, Chronic pain, Gastroenteritis, Headache, Hypotension, Insomnia, and Suicidal ideation.  Initially consulted for: generalized convulsive activity, second lifetime seizure   Major interval events:  Patient has had no seizures overnight and no other acute events  Subjective: Patient states that she feels tired.  She denies any mood symptoms or irritability and states that her personality is at its baseline  Exam: Vitals:   10/03/22 0136 10/03/22 0430  BP: 134/84 133/82  Pulse: 93 89  Resp: 14 17  Temp: 98.6 F (37 C) 98.3 F (36.8 C)  SpO2: 98% 98%   Gen: In bed, comfortable  Resp: non-labored breathing, no grossly audible wheezing Cardiac: Perfusing extremities well  Abd: soft, nt  Neuro: MS: alert and oriented x3, pleasant and cooperative EU:MPNTI, EOMI, face symmetrical, facial sensation intact, hearing intact to voice, phonation normal, shoulder shrug symmetrical, tongue midline Motor: tone and bulk normal with 5/5 strength in all extremities Sensory:intact to light touch throughout DTR:2+ throughout    I have reviewed labs in epic and the results pertinent to this consultation are:  Basic Metabolic Panel: Recent Labs  Lab 09/29/22 0422 09/29/22 1134 09/30/22 0712 10/01/22 0137 10/02/22 0218 10/02/22 2118 10/03/22 0209  NA 136   < > 132* 133* 131* 132* 133*  K 3.0*   < > 5.0 4.7 4.5 4.4 4.2  CL 105   < > 106 106 102 99 97*  CO2 11*   < > 16* 19* 21* 14* 26  GLUCOSE 135*   < > 115* 100* 102* 133* 112*  BUN 12   < > 10 8 13 16 15   CREATININE 0.82   < > 0.64 0.82 0.82 1.09* 0.89  CALCIUM 8.6*   < > 8.9 8.9 8.6* 9.1 8.7*  MG 1.6*  --  1.9  --  1.6* 2.2 1.8  PHOS 2.4*  --  1.7*  --  3.2 4.8* 3.7   < > = values in this interval not displayed.    CBC: Recent Labs  Lab 09/30/22 0712 10/01/22 0137 10/02/22 0218 10/02/22 2118  10/03/22 0209  WBC 11.5* 13.9* 11.9* 21.0* 17.6*  NEUTROABS 10.1* 9.6* 7.8* 11.9* 13.5*  HGB 9.7* 10.5* 10.8* 13.7 11.9*  HCT 29.4* 32.4* 33.1* 43.4 37.3  MCV 84.7 85.9 85.5 88.6 85.9  PLT 386 498* 489* 774* 574*    Coagulation Studies: Recent Labs    10/03/22 0434  LABPROT 12.6  INR 1.0      I have reviewed the images obtained  MRI brain personally reviewed, agree with radiology:   1. No acute intracranial abnormality. 2. Generalized age-related cerebral atrophy with mild to moderate chronic microvascular ischemic disease.  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  - Long term lamotrigine or depakote may be helpful for dual purpose (mood stabilization + seizure control)  Patient seen by nurse practitioner Loma Sousa de la Mervyn Gay and discussed with myself.  Billing per NP  Lesleigh Noe MD-PhD [facility] [phone] Available 7 AM to 7 PM, outside these hours please contact Neurologist on call listed on AMION

## 2022-10-03 NOTE — Progress Notes (Signed)
PROGRESS NOTE    Jamie [name]  EXN:[id number] DOB: [birth year] DOA: 09/28/2022 PCP: Jamie Banner, MD  Chief Complaint  Patient presents with   Bradycardia   Weakness    Brief Narrative:  66 yo with hx chronic pain, anxiety, IBS, hypotension who presented with weakness and fatigue after 3 weeks of nausea, vomiting, and diarrhea. She was was found to have low heart rate into the 30s.  Patient also was hypotensive into the low 70s.  Patient also had significant electrolyte abnormalities.  Patient admitted to ICU for further work-up and treatment of symptomatic bradycardia with hypotension.   Assessment & Plan:   Principal Problem:   Symptomatic bradycardia Active Problems:   Shock circulatory (HCC)   Malnutrition of moderate degree   Hypotension   Assessment and Plan: # Seizure - appreciate neurology recommendations, in setting of unprovoked seizure, starting antiepileptic therapy - MRI brain without acute abnormality - EEG without seizures or epileptiform discharges - will continue keppra - neuro recs appreciated  # Elevated BNP  Abnormal EKG - suspect EKG findings related to seizure - BNP may also be related, doesn't seem significantly overloaded on exam, but LE US showed pulsatile waveforms c/w volume overload - will repeat CXR - PO lasix - Strict I/O, daily weights  # AGMA  Lactic Acidosis - related to seizure, resolved  #Symptomatic bradycardia, resolving - rate improved at this time - limited echo with EF 64%, grade II diastolic dysfunction  - TSH not suggestive of significant hypothyroidism (repeat outpatient) - improved at this time, suspected related to electrolyte abnormalities at presentation?   #Hypotension, resolving - now off pressors -Likely in the setting of bradycardia -Continue to monitor blood pressure -Avoid any blood pressure dropping medicines -Hold is systolic greater than 90   #Left pneumonia #Leukocytosis, secondary to above,  resolving CXR 10/6 with persistent ill defined opacity within the left lung base (concern for atelectassi or pneumonia) CT abd/pelvis with moderate L and small R effusionsCXR 10/8 with shallow inspiration with atelectasis or infiltration in the lung bases Pro-Cal up to 4.58  Will treat empirically with ceftriaxone and doxycycline Blood cx NGTD   #Hypokalemia, resolved #Hypomagnesemia, resolved #Hypophosphatemia, resolving - improved   #Decubitus ulcer -Patient does have wound to lower back.  This is tiny, with some eschar around it.  Wound care following. -Continue wound care   #Chronic pain Patient is complaining of chronic back pain and knee pain -Continue home oxycodone 10 mg every 6 hours as needed   #Anxiety/depression -Resume home Xanax 0.5 mg 3 times daily (half dose)  # Moderate Malnutrition - continue RD  # Pressure Ulcer Pressure Injury 09/29/22 Sacrum Mid Unstageable - Full thickness tissue loss in which the base of the injury is covered by slough (yellow, tan, gray, green or brown) and/or eschar (tan, brown or black) in the wound bed. 2x1.5cm irregular eschar Jamie [name] (Active)  09/29/22 0919  Location: Sacrum  Location Orientation: Mid  Staging: Unstageable - Full thickness tissue loss in which the base of the injury is covered by slough (yellow, tan, gray, green or brown) and/or eschar (tan, brown or black) in the wound bed.  Wound Description (Comments): 2x1.5cm irregular eschar (unstageable) surrounded by stage II  Present on Admission: Yes   Therapy recommending SNF placement, will follow     DVT prophylaxis: heparin Code Status: full Family Communication: none Disposition:   Status is: Inpatient Remains inpatient appropriate because: noen   Consultants:  Pccm  Procedures:  Echo IMPRESSIONS  1. Left ventricular ejection fraction by 3D volume is 64 %. Left  ventricular diastolic parameters are consistent with Grade II diastolic  dysfunction  (pseudonormalization).   2. Right ventricular systolic function is normal. The right ventricular  size is normal. There is mildly elevated pulmonary artery systolic  pressure. The estimated right ventricular systolic pressure is 37.1 mmHg.   3. Left atrial size was severely dilated.   4. Right atrial size was mildly dilated.   5. The mitral valve is grossly normal. Trivial mitral valve  regurgitation. No evidence of mitral stenosis.   6. The aortic valve is tricuspid. Aortic valve regurgitation is trivial.  No aortic stenosis is present.   7. The inferior vena cava is normal in size with greater than 50%  respiratory variability, suggesting right atrial pressure of 3 mmHg.   Antimicrobials:  Anti-infectives (From admission, onward)    Start     Dose/Rate Route Frequency Ordered Stop   09/30/22 2200  doxycycline (VIBRA-TABS) tablet 100 mg        100 mg Oral Every 12 hours 09/30/22 1150     09/30/22 0100  cefTRIAXone (ROCEPHIN) 2 g in sodium chloride 0.9 % 100 mL IVPB        2 g 200 mL/hr over 30 Minutes Intravenous Every 24 hours 09/29/22 0853 10/03/22 0700   09/29/22 1000  doxycycline (VIBRAMYCIN) 100 mg in sodium chloride 0.9 % 250 mL IVPB  Status:  Discontinued        100 mg 125 mL/hr over 120 Minutes Intravenous Every 12 hours 09/29/22 0902 09/30/22 1150   09/29/22 0415  doxycycline (VIBRAMYCIN) 100 mg in sodium chloride 0.9 % 250 mL IVPB  Status:  Discontinued        100 mg 125 mL/hr over 120 Minutes Intravenous Every 12 hours 09/29/22 0400 09/29/22 0902   09/29/22 0130  cefTRIAXone (ROCEPHIN) 2 g in sodium chloride 0.9 % 100 mL IVPB        2 g 200 mL/hr over 30 Minutes Intravenous  Once 09/29/22 0116 09/29/22 0203       Subjective: Remembers waking up confused, not much else  Objective: Vitals:   10/03/22 0136 10/03/22 0430 10/03/22 0807 10/03/22 1221  BP: 134/84 133/82 127/77 114/80  Pulse: 93 89 91 90  Resp: 14 17 18 17   Temp: 98.6 F (37 C) 98.3 F (36.8 C) 98.2  F (36.8 C) 98.8 F (37.1 C)  TempSrc: Oral Oral Oral Oral  SpO2: 98% 98% 96%   Weight:  62.6 kg    Height:        Intake/Output Summary (Last 24 hours) at 10/03/2022 1534 Last data filed at 10/03/2022 0600 Gross per 24 hour  Intake 220 ml  Output 4570 ml  Net -4350 ml   Filed Weights   09/29/22 0001 10/01/22 0456 10/03/22 0430  Weight: 52 kg 64.4 kg 62.6 kg    Examination:  General: No acute distress. Cardiovascular: RRR Lungs: unlabored Abdomen: Soft, nontender, nondistended Neurological: Alert and oriented 3. Moves all extremities 4 with equal strength. Cranial nerves II through XII grossly intact. Extremities: No clubbing or cyanosis. No edema.   Data Reviewed: I have personally reviewed following labs and imaging studies  CBC: Recent Labs  Lab 09/30/22 0712 10/01/22 0137 10/02/22 0218 10/02/22 2118 10/03/22 0209  WBC 11.5* 13.9* 11.9* 21.0* 17.6*  NEUTROABS 10.1* 9.6* 7.8* 11.9* 13.5*  HGB 9.7* 10.5* 10.8* 13.7 11.9*  HCT 29.4* 32.4* 33.1* 43.4 37.3  MCV 84.7 85.9 85.5 88.6  85.9  PLT 386 498* 489* 774* 574*    Basic Metabolic Panel: Recent Labs  Lab 09/29/22 0422 09/29/22 1134 09/30/22 0712 10/01/22 0137 10/02/22 0218 10/02/22 2118 10/03/22 0209  NA 136   < > 132* 133* 131* 132* 133*  K 3.0*   < > 5.0 4.7 4.5 4.4 4.2  CL 105   < > 106 106 102 99 97*  CO2 11*   < > 16* 19* 21* 14* 26  GLUCOSE 135*   < > 115* 100* 102* 133* 112*  BUN 12   < > 10 8 13 16 15   CREATININE 0.82   < > 0.64 0.82 0.82 1.09* 0.89  CALCIUM 8.6*   < > 8.9 8.9 8.6* 9.1 8.7*  MG 1.6*  --  1.9  --  1.6* 2.2 1.8  PHOS 2.4*  --  1.7*  --  3.2 4.8* 3.7   < > = values in this interval not displayed.    GFR: Estimated Creatinine Clearance: 54.4 mL/min (by C-G formula based on SCr of 0.89 mg/dL).  Liver Function Tests: Recent Labs  Lab 09/29/22 0024 10/02/22 0218 10/02/22 2118 10/03/22 0209  AST 16 15 25 16   ALT 10 14 14 12   ALKPHOS 144* 192* 204* 167*  BILITOT 0.9  0.3 0.5 0.4  PROT 5.8* 5.1* 6.0* 5.8*  ALBUMIN 2.8* 2.4* 2.9* 2.6*    CBG: Recent Labs  Lab 10/02/22 1549 10/02/22 1701 10/02/22 2105 10/03/22 0811 10/03/22 1225  GLUCAP 112* 100* 171* 103* 113*     Recent Results (from the past 240 hour(s))  Culture, blood (Routine X 2) w Reflex to ID Panel     Status: None (Preliminary result)   Collection Time: 09/29/22 12:24 AM   Specimen: BLOOD LEFT WRIST  Result Value Ref Range Status   Specimen Description BLOOD LEFT WRIST  Final   Special Requests   Final    BOTTLES DRAWN AEROBIC AND ANAEROBIC Blood Culture adequate volume   Culture   Final    NO GROWTH 4 DAYS Performed at [facility], [place] [address] [place], [place] [address]    Report Status PENDING  Incomplete  Culture, blood (Routine X 2) w Reflex to ID Panel     Status: None (Preliminary result)   Collection Time: 09/29/22 12:40 AM   Specimen: BLOOD RIGHT HAND  Result Value Ref Range Status   Specimen Description BLOOD RIGHT HAND  Final   Special Requests   Final    BOTTLES DRAWN AEROBIC AND ANAEROBIC Blood Culture adequate volume   Culture   Final    NO GROWTH 4 DAYS Performed at [facility], [place] [address] [place], [place] [address]    Report Status PENDING  Incomplete  MRSA Next Gen by PCR, Nasal     Status: None   Collection Time: 09/29/22  5:59 AM   Specimen: Nasal Mucosa; Nasal Swab  Result Value Ref Range Status   MRSA by PCR Next Gen NOT DETECTED NOT DETECTED Final    Comment: (NOTE) The GeneXpert MRSA Assay (FDA approved for NASAL specimens only), is one component of Arryana Tolleson comprehensive MRSA colonization surveillance program. It is not intended to diagnose MRSA infection nor to guide or monitor treatment for MRSA infections. Test performance is not FDA approved in patients less than 64 years old. Performed at [facility], [place] [address], [place], [place] [address]          Radiology Studies: VAS [place] LOWER  EXTREMITY VENOUS  (DVT)  Result Date: 10/03/2022  Lower Venous DVT Study Patient Name:  ASMA HARVARD  Date of Exam:   10/02/2022 Medical Rec #: [id number]     Accession #:    [id number] Date of Birth: [birth year]     Patient Gender: F Patient Age:   35 years Exam Location:  [facility] Procedure:      VAS [place] LOWER EXTREMITY VENOUS (DVT) Referring Phys: Tamzin Bertling POWELL JR --------------------------------------------------------------------------------  Indications: Edema.  Comparison Study: No prior study on file Performing Technologist: Sherren Kerns RVS  Examination Guidelines: Jacklyne Baik complete evaluation includes B-mode imaging, spectral Doppler, color Doppler, and power Doppler as needed of all accessible portions of each vessel. Bilateral testing is considered an integral part of Mar Walmer complete examination. Limited examinations for reoccurring indications may be performed as noted. The reflux portion of the exam is performed with the patient in reverse Trendelenburg.  +--------+---------------+---------+-----------+----------------+-------------+ RIGHT   CompressibilityPhasicitySpontaneityProperties      Thrombus                                                                 Aging         +--------+---------------+---------+-----------+----------------+-------------+ CFV     Full                               pulsatile                                                                waveforms                     +--------+---------------+---------+-----------+----------------+-------------+ SFJ     Full                                                             +--------+---------------+---------+-----------+----------------+-------------+ FV Prox Full                                                             +--------+---------------+---------+-----------+----------------+-------------+ FV Mid  Full                                                              +--------+---------------+---------+-----------+----------------+-------------+ FV      Full  Distal                                                                   +--------+---------------+---------+-----------+----------------+-------------+ PFV     Full                                                             +--------+---------------+---------+-----------+----------------+-------------+ POP     Full                               pulsatile                                                                waveforms                     +--------+---------------+---------+-----------+----------------+-------------+ PTV     Full                                                             +--------+---------------+---------+-----------+----------------+-------------+ PERO    Full                                                             +--------+---------------+---------+-----------+----------------+-------------+   +--------+---------------+---------+-----------+----------------+-------------+ LEFT    CompressibilityPhasicitySpontaneityProperties      Thrombus                                                                 Aging         +--------+---------------+---------+-----------+----------------+-------------+ CFV     Full                               pulsatile                                                                waveforms                     +--------+---------------+---------+-----------+----------------+-------------+  SFJ     Full                                                             +--------+---------------+---------+-----------+----------------+-------------+ FV Prox Full                                                             +--------+---------------+---------+-----------+----------------+-------------+ FV Mid  Full                                                              +--------+---------------+---------+-----------+----------------+-------------+ FV      Full                                                             Distal                                                                   +--------+---------------+---------+-----------+----------------+-------------+ PFV     Full                                                             +--------+---------------+---------+-----------+----------------+-------------+ POP     Full                               pulsatile                                                                waveforms                     +--------+---------------+---------+-----------+----------------+-------------+ PTV     Full                                                             +--------+---------------+---------+-----------+----------------+-------------+ PERO    Full                                                             +--------+---------------+---------+-----------+----------------+-------------+  Summary: BILATERAL: - No evidence of deep vein thrombosis seen in the lower extremities, bilaterally. -No evidence of popliteal cyst, bilaterally. RIGHT: pulsatile waveforms consistent with fluid overload  LEFT: Pulsatile waveforms consistent with fluid overload.  *See table(s) above for measurements and observations. Electronically signed by Sherald Hess MD on 10/03/2022 at 10:45:28 AM.    Final    EEG adult  Result Date: 10/03/2022 Charlsie Quest, MD     10/03/2022 10:12 AM Patient Name: SUSANA DUELL MRN: [id number] Epilepsy Attending: Charlsie Quest Referring Physician/Provider: Angie Fava, DO Date: 10/03/2022 Duration: 23.45 mins Patient history:  66 y.o. female she was seen in July with new onset seizure with concern for possible Xanax or alcohol withdrawal. Yesterday nursing received Glyn Zendejas call that she was tachycardic  and on the response of the nursing training, she found her to be slumped to the side with gaze to the left.  She got her supervisor and by the time she arrived, she was having generalized convulsive activity which lasted for approximately Keniel Ralston minute.  She subsequently had Jshon Ibe postictal state, and then gradually improved.EEG to evaluate for seizure Level of alertness: Awake, asleep AEDs during EEG study: LEV, Xanax Technical aspects: This EEG study was done with scalp electrodes positioned according to the 10-20 International system of electrode placement. Electrical activity was reviewed with band pass filter of 1-70Hz , sensitivity of 7 uV/mm, display speed of 16mm/sec with Kazue Cerro 60Hz  notched filter applied as appropriate. EEG data were recorded continuously and digitally stored.  Video monitoring was available and reviewed as appropriate. Description: The posterior dominant rhythm consists of 8-9 Hz activity of moderate voltage (25-35 uV) seen predominantly in posterior head regions, symmetric and reactive to eye opening and eye closing. Sleep was characterized by vertex waves, sleep spindles (12 to 14 Hz), maximal frontocentral region. Physiologic photic driving was seen during photic stimulation.  Hyperventilation was not performed.   IMPRESSION: This study is within normal limits. No seizures or epileptiform discharges were seen throughout the recording. Tyshay Adee normal interictal EEG does not exclude the diagnosis of epilepsy.   MR BRAIN WO CONTRAST  Result Date: 10/03/2022 CLINICAL DATA:  Initial evaluation for seizure, weakness. EXAM: MRI HEAD WITHOUT CONTRAST TECHNIQUE: Multiplanar, multiecho pulse sequences of the brain and surrounding structures were obtained without intravenous contrast. COMPARISON:  Prior CT from 09/29/2022. FINDINGS: Brain: Examination mildly degraded by motion artifact. Diffuse prominence of the CSF containing spaces compatible generalized age-related cerebral atrophy. Scattered  patchy T2/FLAIR hyperintensity involving the periventricular deep white matter both cerebral hemispheres, most consistent with chronic small vessel ischemic disease, mild to moderate in nature. No abnormal foci of restricted diffusion to suggest acute or subacute ischemia. Gray-white matter differentiation well maintained. No encephalomalacia to suggest chronic cortical infarction or other insult. No foci of susceptibility artifact indicative of acute or chronic intracranial blood products. No mass lesion, midline shift or mass effect. Ventricles normal in size and morphology without hydrocephalus. No extra-axial fluid collection. Pituitary gland and suprasellar region within normal limits. No intrinsic temporal lobe abnormality. Vascular: Asymmetric FLAIR signal intensity involving the right transverse sinus, likely related to slow/sluggish flow. Major intracranial vascular flow voids are otherwise maintained. Skull and upper cervical spine: Craniocervical junction within normal limits. Visualized upper cervical spine demonstrates no significant finding. Bone marrow signal intensity within normal limits. No scalp soft tissue abnormality. Sinuses/Orbits: Globes and orbital soft tissues are within normal limits. Paranasal sinuses are largely clear. Small to moderate bilateral mastoid effusions. Visualized  nasopharynx unremarkable. Other: None. IMPRESSION: 1. No acute intracranial abnormality. 2. Generalized age-related cerebral atrophy with mild to moderate chronic microvascular ischemic disease. Electronically Signed   By: Rise Mu M.D.   On: 10/03/2022 01:40   DG CHEST PORT 1 VIEW  Result Date: 10/02/2022 CLINICAL DATA:  Pneumonia.  Patient is in distress after code. EXAM: PORTABLE CHEST 1 VIEW COMPARISON:  09/30/2022 FINDINGS: Shallow inspiration with infiltration or atelectasis in the lung bases. Mild progression on the right since the previous study. Cardiac enlargement. No vascular congestion.  No pleural effusions. No pneumothorax. Mediastinal contours appear intact. Degenerative changes in the spine and shoulders. IMPRESSION: Shallow inspiration with atelectasis or infiltration in the lung bases, progressing since prior study. Electronically Signed   By: Burman Nieves M.D.   On: 10/02/2022 21:36        Scheduled Meds:  acetaminophen  1,000 mg Oral q12n4p   diclofenac Sodium  2 g Topical QID   doxycycline  100 mg Oral Q12H   fentaNYL (SUBLIMAZE) injection  25 mcg Intravenous Once   Gerhardt's butt cream   Topical BID   heparin  5,000 Units Subcutaneous Q8H   insulin aspart  0-9 Units Subcutaneous TID WC   levETIRAcetam  500 mg Oral BID   lidocaine  1 patch Transdermal Q24H   magnesium oxide  400 mg Oral Daily   multivitamin with minerals  1 tablet Oral Daily   pantoprazole  40 mg Oral Daily   thiamine  100 mg Oral Daily   Continuous Infusions:     LOS: 4 days    Time spent: over 30 min    Lacretia Nicks, MD [facility]   To contact the attending provider between 7A-7P or the covering provider during after hours 7P-7A, please log into the web site [email or web address] and access using universal [place] password for that web site. If you do not have the password, please call the hospital operator.  10/03/2022, 3:34 PM

## 2022-10-04 ENCOUNTER — Other Ambulatory Visit (HOSPITAL_COMMUNITY): Payer: Self-pay

## 2022-10-04 LAB — CBC WITH DIFFERENTIAL/PLATELET
Abs Immature Granulocytes: 0.13 10*3/uL — ABNORMAL HIGH (ref 0.00–0.07)
Basophils Absolute: 0.1 10*3/uL (ref 0.0–0.1)
Basophils Relative: 1 %
Eosinophils Absolute: 0.5 10*3/uL (ref 0.0–0.5)
Eosinophils Relative: 5 %
HCT: 35.4 % — ABNORMAL LOW (ref 36.0–46.0)
Hemoglobin: 11 g/dL — ABNORMAL LOW (ref 12.0–15.0)
Immature Granulocytes: 1 %
Lymphocytes Relative: 32 %
Lymphs Abs: 3.8 10*3/uL (ref 0.7–4.0)
MCH: 26.8 pg (ref 26.0–34.0)
MCHC: 31.1 g/dL (ref 30.0–36.0)
MCV: 86.3 fL (ref 80.0–100.0)
Monocytes Absolute: 1 10*3/uL (ref 0.1–1.0)
Monocytes Relative: 8 %
Neutro Abs: 6.3 10*3/uL (ref 1.7–7.7)
Neutrophils Relative %: 53 %
Platelets: 575 10*3/uL — ABNORMAL HIGH (ref 150–400)
RBC: 4.1 MIL/uL (ref 3.87–5.11)
RDW: 15.8 % — ABNORMAL HIGH (ref 11.5–15.5)
WBC: 11.8 10*3/uL — ABNORMAL HIGH (ref 4.0–10.5)
nRBC: 0 % (ref 0.0–0.2)

## 2022-10-04 LAB — COMPREHENSIVE METABOLIC PANEL
ALT: 10 U/L (ref 0–44)
AST: 13 U/L — ABNORMAL LOW (ref 15–41)
Albumin: 2.5 g/dL — ABNORMAL LOW (ref 3.5–5.0)
Alkaline Phosphatase: 149 U/L — ABNORMAL HIGH (ref 38–126)
Anion gap: 11 (ref 5–15)
BUN: 18 mg/dL (ref 8–23)
CO2: 25 mmol/L (ref 22–32)
Calcium: 8.8 mg/dL — ABNORMAL LOW (ref 8.9–10.3)
Chloride: 98 mmol/L (ref 98–111)
Creatinine, Ser: 0.95 mg/dL (ref 0.44–1.00)
GFR, Estimated: >60 mL/min (ref >60)
Glucose, Bld: 113 mg/dL — ABNORMAL HIGH (ref 70–99)
Potassium: 3.9 mmol/L (ref 3.5–5.1)
Sodium: 134 mmol/L — ABNORMAL LOW (ref 135–145)
Total Bilirubin: 0.2 mg/dL — ABNORMAL LOW (ref 0.3–1.2)
Total Protein: 5.3 g/dL — ABNORMAL LOW (ref 6.5–8.1)

## 2022-10-04 LAB — MAGNESIUM: Magnesium: 1.8 mg/dL (ref 1.7–2.4)

## 2022-10-04 LAB — CULTURE, BLOOD (ROUTINE X 2)
Culture: NO GROWTH
Culture: NO GROWTH
Special Requests: ADEQUATE
Special Requests: ADEQUATE

## 2022-10-04 LAB — GLUCOSE, CAPILLARY
Glucose-Capillary: 109 mg/dL — ABNORMAL HIGH (ref 70–99)
Glucose-Capillary: 96 mg/dL (ref 70–99)

## 2022-10-04 LAB — PHOSPHORUS: Phosphorus: 4.3 mg/dL (ref 2.5–4.6)

## 2022-10-04 MED ORDER — OXYCODONE HCL 10 MG PO TABS
10.0000 mg | ORAL_TABLET | Freq: Four times a day (QID) | ORAL | 0 refills | Status: AC
  Filled 2022-10-04: qty 20, 5d supply, fill #0

## 2022-10-04 MED ORDER — POTASSIUM CHLORIDE CRYS ER 20 MEQ PO TBCR
20.0000 meq | EXTENDED_RELEASE_TABLET | Freq: Every day | ORAL | 0 refills | Status: AC
  Filled 2022-10-04: qty 7, 7d supply, fill #0

## 2022-10-04 MED ORDER — LEVETIRACETAM 500 MG PO TABS
500.0000 mg | ORAL_TABLET | Freq: Two times a day (BID) | ORAL | 1 refills | Status: AC
  Filled 2022-10-04: qty 60, 30d supply, fill #0

## 2022-10-04 MED ORDER — FUROSEMIDE 40 MG PO TABS
40.0000 mg | ORAL_TABLET | Freq: Every day | ORAL | 0 refills | Status: DC
  Filled 2022-10-04: qty 7, 7d supply, fill #0

## 2022-10-04 MED ORDER — GERHARDT'S BUTT CREAM
1.0000 | TOPICAL_CREAM | Freq: Two times a day (BID) | CUTANEOUS | 0 refills | Status: DC
  Filled 2022-10-04: qty 60, 30d supply, fill #0

## 2022-10-04 MED ORDER — ALPRAZOLAM 1 MG PO TABS
0.5000 mg | ORAL_TABLET | Freq: Every day | ORAL | 0 refills | Status: AC
  Filled 2022-10-04: qty 3, 6d supply, fill #0

## 2022-10-04 NOTE — Discharge Summary (Addendum)
Physician Discharge Summary  Jamie [name] [id number] DOB: 03-May-1956 DOA: 09/28/2022  PCP: Christain Sacramento, MD  Admit date: 09/28/2022 Discharge date: 10/04/2022  Time spent: 40 minutes  Recommendations for Outpatient Follow-up:  Follow outpatient CBC/CMP  Follow with neurology outpatient with keppra for seizures Follow with cardiology outpatient for diastolic heart failure Needs repeat CXR in Zareena Willis few weeks outpatient Follow MASD outpatient  Follow polypharmacy outpatient - deprescribe where able, limit narcotics as able - discharging with limited prescriptions of oxycodone, xanax - reviewed database.  Xanax ultimately would preferentially be discontinued long term.  Discharge Diagnoses:  Principal Problem:   Symptomatic bradycardia Active Problems:   Shock circulatory (HCC)   Malnutrition of moderate degree   Hypotension   Discharge Condition: stable  Diet recommendation: heart healthy  Filed Weights   10/01/22 0456 10/03/22 0430 10/04/22 0456  Weight: 64.4 kg 62.6 kg 60 kg    History of present illness:  66 yo with hx chronic pain, anxiety, IBS, hypotension who presented with weakness and fatigue after 3 weeks of nausea, vomiting, and diarrhea. She was was found to have low heart rate into the 30s.  Patient also was hypotensive into the low 70s.  Patient also had significant electrolyte abnormalities.  Patient admitted to ICU for further work-up and treatment of symptomatic bradycardia with hypotension.  She was found to have shock related to her bradycardia and sepsis from pneumonia.  She's improved after electrolyte correction, pressors, antibiotics.  Hospitalization was complicated by seizure x1 (possibly related to benzo withdrawal?).  Now on keppa.  She's stable at this time for discharge with outpatient PCP, neurology, and cardiology follow up.  LE [place] notable for pulsatile waveforms concerning for overload.  She's not significantly volume overloaded on exam, but will  discharge with lasix and referral to cardiology given grade 2 diastolic dysfunction.  See below for additional details   Hospital Course:  Assessment and Plan: # Seizure - appreciate neurology recommendations, in setting of unprovoked seizure, starting antiepileptic therapy (? Of benzo withdrawal, not sure if this fits hx, apparently hadn't taken benzos for 2 weeks prior to admission, doesn't quite fit).  Will discharge with short course of xanax, given hx of not taking benzo for Elianie Hubers few weeks, doubt she should withdrawal (has received intermittently here - discussed paln with neuro). - MRI brain without acute abnormality - EEG without seizures or epileptiform discharges - will continue keppra - neuro recs appreciated  Per [facility] statutes, patients with seizures are not allowed to drive until  they have been seizure-free for six months. Use caution when using heavy equipment or power tools. Avoid working on ladders or at heights. Take showers instead of baths. Ensure the water temperature is not too high on the home water heater. Do not go swimming alone. When caring for infants or small children, sit down when holding, feeding, or changing them to minimize risk of injury to the child in the event you have Kimmy Parish seizure. Also, Maintain good sleep hygiene. Avoid alcohol.    # Elevated BNP  Abnormal EKG  Diastolic Dysfunction - suspect EKG findings related to seizure - BNP elevation may also be related to seizure - doesn't seem significantly overloaded on exam, but LE US showed pulsatile waveforms c/w volume overload - will repeat CXR -> persistent ill defined bibasilar opacities (worsened in R lung base), small bilateral effusions, borderline cardiomegaly  - PO lasix - Strict I/O, daily weights   # AGMA  Lactic Acidosis - related  to seizure, resolved   #Symptomatic bradycardia, resolving - rate improved at this time - limited echo with EF 64%, grade II diastolic dysfunction  -  TSH not suggestive of significant hypothyroidism (repeat outpatient) - improved at this time, suspected related to electrolyte abnormalities at presentation?   #Hypotension, resolving - now off pressors -Likely in the setting of bradycardia -Continue to monitor blood pressure -Avoid any blood pressure dropping medicines -Hold is systolic greater than 90   #Left pneumonia #Leukocytosis, secondary to above, resolving CXR 10/6 with persistent ill defined opacity within the left lung base (concern for atelectassi or pneumonia) CT abd/pelvis with moderate L and small R effusionsCXR 10/8 with shallow inspiration with atelectasis or infiltration in the lung bases Pro-Cal up to 4.58  S/p treatment with ceftriaxone/doxycycline  Needs repeat CXR outpatient (worsening opacity in R lung, suspect this maybe related to aspiration from seizure event -> white count improving, afebrile, no worsening resp symptoms) Blood cx NGTD   #Hypokalemia, resolved #Hypomagnesemia, resolved #Hypophosphatemia, resolving - improved   #Chronic pain Patient is complaining of chronic back pain and knee pain -Continue home oxycodone 10 mg every 6 hours as needed   #Anxiety/depression -Resume home Xanax 0.5 mg 3 times daily (half dose)   # Moderate Malnutrition - continue RD   #Decubitus ulcer -Patient does have wound to lower back.  This is tiny, with some eschar around it.  Wound care following -> currently I only appreciate MASD - continue gerhardts - follow outpatient   Pressure Injury 09/29/22 Sacrum Mid Unstageable - Full thickness tissue loss in which the base of the injury is covered by slough (yellow, tan, gray, green or brown) and/or eschar (tan, brown or black) in the wound bed. 2x1.5cm irregular eschar [name] (Active)  09/29/22 0919  Location: Sacrum  Location Orientation: Mid  Staging: Unstageable - Full thickness tissue loss in which the base of the injury is covered by slough (yellow, tan,  gray, green or brown) and/or eschar (tan, brown or black) in the wound bed.  Wound Description (Comments): 2x1.5cm irregular eschar (unstageable) surrounded by stage II  Present on Admission: Yes   Pressure Injury 09/29/22 Sacrum Mid Unstageable - Full thickness tissue loss in which the base of the injury is covered by slough (yellow, tan, gray, green or brown) and/or eschar (tan, brown or black) in the wound bed. 2x1.5cm irregular eschar [name] (Active)  09/29/22 0919  Location: Sacrum  Location Orientation: Mid  Staging: Unstageable - Full thickness tissue loss in which the base of the injury is covered by slough (yellow, tan, gray, green or brown) and/or eschar (tan, brown or black) in the wound bed.  Wound Description (Comments): 2x1.5cm irregular eschar (unstageable) surrounded by stage II  Present on Admission: Yes     Procedures: LE [place] Summary:  BILATERAL:  - No evidence of deep vein thrombosis seen in the lower extremities,  bilaterally.  -No evidence of popliteal cyst, bilaterally.  RIGHT:  pulsatile waveforms consistent with fluid overload     LEFT:  Pulsatile waveforms consistent with fluid overload.   Echo IMPRESSIONS     1. Left ventricular ejection fraction by 3D volume is 64 %. Left  ventricular diastolic parameters are consistent with Grade II diastolic  dysfunction (pseudonormalization).   2. Right ventricular systolic function is normal. The right ventricular  size is normal. There is mildly elevated pulmonary artery systolic  pressure. The estimated right ventricular systolic pressure is [id number] mmHg.   3. Left atrial size was severely dilated.  4. Right atrial size was mildly dilated.   5. The mitral valve is grossly normal. Trivial mitral valve  regurgitation. No evidence of mitral stenosis.   6. The aortic valve is tricuspid. Aortic valve regurgitation is trivial.  No aortic stenosis is present.   7. The inferior vena cava is normal in size with  greater than 50%  respiratory variability, suggesting right atrial pressure of 3 mmHg.   Consultations: PCCM  Discharge Exam: Vitals:   10/04/22 0739 10/04/22 1200  BP: 125/74 116/77  Pulse: 90 94  Resp: 14 16  Temp: 98.6 F (37 C) 97.8 F (36.6 C)  SpO2: 97% 96%   Feels well, comfortable with going home Extensive discussion with daughter  General: No acute distress. Cardiovascular: Heart sounds show Jarick Harkins regular rate, and rhythm. Lungs: Clear to auscultation bilaterally Abdomen: Soft, nontender, nondistended  Neurological: Alert and oriented 3. Moves all extremities 4. Cranial nerves II through XII grossly intact. Extremities: No clubbing or cyanosis. No edema.   Discharge Instructions   Discharge Instructions     Ambulatory referral to Neurology   Complete by: As directed    An appointment is requested in approximately: 4 weeks   Call MD for:  difficulty breathing, headache or visual disturbances   Complete by: As directed    Call MD for:  extreme fatigue   Complete by: As directed    Call MD for:  hives   Complete by: As directed    Call MD for:  persistant dizziness or light-headedness   Complete by: As directed    Call MD for:  persistant nausea and vomiting   Complete by: As directed    Call MD for:  redness, tenderness, or signs of infection (pain, swelling, redness, odor or green/yellow discharge around incision site)   Complete by: As directed    Call MD for:  severe uncontrolled pain   Complete by: As directed    Call MD for:  temperature >100.4   Complete by: As directed    Diet - low sodium heart healthy   Complete by: As directed    Discharge instructions   Complete by: As directed    You were seen initially with Rolena Knutson slow heart rate with low blood pressure in the setting of electrolyte abnormalities and pneumonia.  You've improved as we've corrected your electrolytes and treated your pneumonia.    Your hospitalization was complicated by Ame Heagle seizure.   You've been started on keppra.  You'll need to follow up with neurology as an outpatient.  You should NOT drive.  Follow seizure precautions outpatient.  You've also been started on lasix for concern for Arianna Delsanto degree of heart failure.  You'll need outpatient follow up to reassess your fluid status as well as your electrolytes and kidney function.  Ask your PCP if you need to continue this long term.  You'll need repeat chest x ray within Zadrian Mccauley few weeks.  Return for new, recurrent, or worsening symptoms.  Please ask your PCP to request records from this hospitalization so they know what was done and what the next steps will be.  Per [facility] statutes, patients with seizures are not allowed to drive until  they have been seizure-free for six months. Use caution when using heavy equipment or power tools. Avoid working on ladders or at heights. Take showers instead of baths. Ensure the water temperature is not too high on the home water heater. Do not go swimming alone. When caring for infants or  small children, sit down when holding, feeding, or changing them to minimize risk of injury to the child in the event you have Jaquis Picklesimer seizure. Also, Maintain good sleep hygiene. Avoid alcohol.   Discharge wound care:   Complete by: As directed    Continue with the gerhardt's butt cream twice daily Follow with outpatient provider for additional wound care   Increase activity slowly   Complete by: As directed       Allergies as of 10/04/2022       Reactions   Almond Oil Itching   Amoxicillin-pot Clavulanate Nausea And Vomiting, Other (See Comments)   Can tolerate plain Amoxicillin (??)   Azithromycin Nausea And Vomiting   Bupropion Nausea And Vomiting   Nsaids Other (See Comments)   Stomach problems   Prednisone Diarrhea   Vilazodone Diarrhea, Other (See Comments)   Viibryd        Medication List     TAKE these medications    acetaminophen 500 MG tablet Commonly known as: TYLENOL Take 2  tablets (1,000 mg total) by mouth every 8 (eight) hours as needed for mild pain or headache (or Fever >/= 101).   ALPRAZolam 1 MG tablet Commonly known as: XANAX Take 1 tablet (1 mg total) by mouth 3 (three) times daily as needed for anxiety or sleep. What changed: when to take this   BC HEADACHE POWDER PO Take 1 packet by mouth 3 (three) times daily as needed (for headaches).   butalbital-acetaminophen-caffeine 50-325-40 MG tablet Commonly known as: FIORICET Take 1-2 tablets by mouth 2 (two) times daily as needed for headache. What changed: how much to take   furosemide 40 MG tablet Commonly known as: LASIX Take 1 tablet (40 mg total) by mouth daily for 7 days. Follow repeat labs with your PCP within Maila Dukes week and discuss whether you should continue this medication. Start taking on: October 05, 2022   Gerhardt's butt cream Crea Apply 1 Application topically 2 (two) times daily. To rash on bottom   levETIRAcetam 500 MG tablet Commonly known as: KEPPRA Take 1 tablet (500 mg total) by mouth 2 (two) times daily.   loratadine 10 MG tablet Commonly known as: CLARITIN Take 1 tablet (10 mg total) by mouth daily. What changed:  when to take this reasons to take this   Oxycodone HCl 10 MG Tabs Take 1 tablet (10 mg total) by mouth 4 (four) times daily for 5 days.   pantoprazole 40 MG tablet Commonly known as: PROTONIX Take 1 tablet (40 mg total) by mouth daily. What changed:  when to take this reasons to take this   Potassium Chloride ER 20 MEQ Tbcr Take 20 mEq by mouth daily for 7 days. Follow repeat labs within 1 week and discuss whether you should continue this medication   tiZANidine 4 MG tablet Commonly known as: ZANAFLEX Take 4 mg by mouth 3 (three) times daily.   traZODone 100 MG tablet Commonly known as: DESYREL Take 1 tablet (100 mg total) by mouth at bedtime as needed for sleep.               Discharge Care Instructions  (From admission, onward)            Start     Ordered   10/04/22 0000  Discharge wound care:       Comments: Continue with the gerhardt's butt cream twice daily Follow with outpatient provider for additional wound care   10/04/22 1459  Allergies  Allergen Reactions   Almond Oil Itching   Amoxicillin-Pot Clavulanate Nausea And Vomiting and Other (See Comments)    Can tolerate plain Amoxicillin (??)   Azithromycin Nausea And Vomiting   Bupropion Nausea And Vomiting   Nsaids Other (See Comments)    Stomach problems   Prednisone Diarrhea   Vilazodone Diarrhea and Other (See Comments)    Viibryd      The results of significant diagnostics from this hospitalization (including imaging, microbiology, ancillary and laboratory) are listed below for reference.    Significant Diagnostic Studies: DG CHEST PORT 1 VIEW  Result Date: 10/03/2022 CLINICAL DATA:  Elevated BNP level. EXAM: PORTABLE CHEST 1 VIEW COMPARISON:  Radiograph yesterday. FINDINGS: Improved lung aeration from prior exam. Heart size upper normal. Stable mediastinal contours. Persistent ill-defined bibasilar opacities, worsening in the right lung base. No convincing pulmonary edema. There may be small pleural effusions. No pneumothorax. IMPRESSION: 1. Persistent ill-defined bibasilar opacities, worsening in the right lung base. This is greater than typically seen with atelectasis and suspicious for pneumonia or aspiration. 2. Possible small pleural effusions. 3. Borderline cardiomegaly without convincing pulmonary edema. Electronically Signed   By: Keith Rake M.D.   On: 10/03/2022 16:43   VAS [place] LOWER EXTREMITY VENOUS (DVT)  Result Date: 10/03/2022  Lower Venous DVT Study Patient Name:  KIARA WELCOME  Date of Exam:   10/02/2022 Medical Rec #: [id number]     Accession #:    [id number] Date of Birth: 1956-03-04     Patient Gender: F Patient Age:   66 years Exam Location:  [facility] Procedure:      VAS [place] LOWER EXTREMITY VENOUS (DVT)  Referring Phys: Faythe Heitzenrater POWELL JR --------------------------------------------------------------------------------  Indications: Edema.  Comparison Study: No prior study on file Performing Technologist: Sharion Dove RVS  Examination Guidelines: Marysol Wellnitz complete evaluation includes B-mode imaging, spectral Doppler, color Doppler, and power Doppler as needed of all accessible portions of each vessel. Bilateral testing is considered an integral part of Aryon Nham complete examination. Limited examinations for reoccurring indications may be performed as noted. The reflux portion of the exam is performed with the patient in reverse Trendelenburg.  +--------+---------------+---------+-----------+----------------+-------------+ RIGHT   CompressibilityPhasicitySpontaneityProperties      Thrombus                                                                 Aging         +--------+---------------+---------+-----------+----------------+-------------+ CFV     Full                               pulsatile                                                                waveforms                     +--------+---------------+---------+-----------+----------------+-------------+ SFJ     Full                                                             +--------+---------------+---------+-----------+----------------+-------------+  FV Prox Full                                                             +--------+---------------+---------+-----------+----------------+-------------+ FV Mid  Full                                                             +--------+---------------+---------+-----------+----------------+-------------+ FV      Full                                                             Distal                                                                   +--------+---------------+---------+-----------+----------------+-------------+ PFV     Full                                                              +--------+---------------+---------+-----------+----------------+-------------+ POP     Full                               pulsatile                                                                waveforms                     +--------+---------------+---------+-----------+----------------+-------------+ PTV     Full                                                             +--------+---------------+---------+-----------+----------------+-------------+ PERO    Full                                                             +--------+---------------+---------+-----------+----------------+-------------+   +--------+---------------+---------+-----------+----------------+-------------+ LEFT    CompressibilityPhasicitySpontaneityProperties  Thrombus                                                                 Aging         +--------+---------------+---------+-----------+----------------+-------------+ CFV     Full                               pulsatile                                                                waveforms                     +--------+---------------+---------+-----------+----------------+-------------+ SFJ     Full                                                             +--------+---------------+---------+-----------+----------------+-------------+ FV Prox Full                                                             +--------+---------------+---------+-----------+----------------+-------------+ FV Mid  Full                                                             +--------+---------------+---------+-----------+----------------+-------------+ FV      Full                                                             Distal                                                                    +--------+---------------+---------+-----------+----------------+-------------+ PFV     Full                                                             +--------+---------------+---------+-----------+----------------+-------------+ POP  Full                               pulsatile                                                                waveforms                     +--------+---------------+---------+-----------+----------------+-------------+ PTV     Full                                                             +--------+---------------+---------+-----------+----------------+-------------+ PERO    Full                                                             +--------+---------------+---------+-----------+----------------+-------------+     Summary: BILATERAL: - No evidence of deep vein thrombosis seen in the lower extremities, bilaterally. -No evidence of popliteal cyst, bilaterally. RIGHT: pulsatile waveforms consistent with fluid overload  LEFT: Pulsatile waveforms consistent with fluid overload.  *See table(s) above for measurements and observations. Electronically signed by Monica Martinez MD on 10/03/2022 at 10:45:28 AM.    Final    EEG adult  Result Date: 10/03/2022 Lora Havens, MD     10/03/2022 10:12 AM Patient Name: HAYDE CONDREN MRN: [id number] Epilepsy Attending: Lora Havens Referring Physician/Provider: Rhetta Mura, DO Date: 10/03/2022 Duration: 23.45 mins Patient history:  66 y.o. female she was seen in July with new onset seizure with concern for possible Xanax or alcohol withdrawal. Yesterday nursing received Nakya Weyand call that she was tachycardic and on the response of the nursing training, she found her to be slumped to the side with gaze to the left.  She got her supervisor and by the time she arrived, she was having generalized convulsive activity which lasted for approximately Chabely Norby minute.  She subsequently had Rachna Schonberger postictal state, and  then gradually improved.EEG to evaluate for seizure Level of alertness: Awake, asleep AEDs during EEG study: LEV, Xanax Technical aspects: This EEG study was done with scalp electrodes positioned according to the 10-20 International system of electrode placement. Electrical activity was reviewed with band pass filter of 1-70Hz , sensitivity of 7 uV/mm, display speed of 54mm/sec with Aryahi Denzler 60Hz  notched filter applied as appropriate. EEG data were recorded continuously and digitally stored.  Video monitoring was available and reviewed as appropriate. Description: The posterior dominant rhythm consists of 8-9 Hz activity of moderate voltage (25-35 uV) seen predominantly in posterior head regions, symmetric and reactive to eye opening and eye closing. Sleep was characterized by vertex waves, sleep spindles (12 to 14 Hz), maximal frontocentral region. Physiologic photic driving was seen during photic stimulation.  Hyperventilation was not performed.   IMPRESSION: This study is within normal limits. No seizures or epileptiform discharges were seen throughout the recording.  Jamesa Tedrick normal interictal EEG does not exclude the diagnosis of epilepsy. Lora Havens   MR BRAIN WO CONTRAST  Result Date: 10/03/2022 CLINICAL DATA:  Initial evaluation for seizure, weakness. EXAM: MRI HEAD WITHOUT CONTRAST TECHNIQUE: Multiplanar, multiecho pulse sequences of the brain and surrounding structures were obtained without intravenous contrast. COMPARISON:  Prior CT from 09/29/2022. FINDINGS: Brain: Examination mildly degraded by motion artifact. Diffuse prominence of the CSF containing spaces compatible generalized age-related cerebral atrophy. Scattered patchy T2/FLAIR hyperintensity involving the periventricular deep white matter both cerebral hemispheres, most consistent with chronic small vessel ischemic disease, mild to moderate in nature. No abnormal foci of restricted diffusion to suggest acute or subacute ischemia. Gray-white matter  differentiation well maintained. No encephalomalacia to suggest chronic cortical infarction or other insult. No foci of susceptibility artifact indicative of acute or chronic intracranial blood products. No mass lesion, midline shift or mass effect. Ventricles normal in size and morphology without hydrocephalus. No extra-axial fluid collection. Pituitary gland and suprasellar region within normal limits. No intrinsic temporal lobe abnormality. Vascular: Asymmetric FLAIR signal intensity involving the right transverse sinus, likely related to slow/sluggish flow. Major intracranial vascular flow voids are otherwise maintained. Skull and upper cervical spine: Craniocervical junction within normal limits. Visualized upper cervical spine demonstrates no significant finding. Bone marrow signal intensity within normal limits. No scalp soft tissue abnormality. Sinuses/Orbits: Globes and orbital soft tissues are within normal limits. Paranasal sinuses are largely clear. Small to moderate bilateral mastoid effusions. Visualized nasopharynx unremarkable. Other: None. IMPRESSION: 1. No acute intracranial abnormality. 2. Generalized age-related cerebral atrophy with mild to moderate chronic microvascular ischemic disease. Electronically Signed   By: Jeannine Boga M.D.   On: 10/03/2022 01:40   DG CHEST PORT 1 VIEW  Result Date: 10/02/2022 CLINICAL DATA:  Pneumonia.  Patient is in distress after code. EXAM: PORTABLE CHEST 1 VIEW COMPARISON:  09/30/2022 FINDINGS: Shallow inspiration with infiltration or atelectasis in the lung bases. Mild progression on the right since the previous study. Cardiac enlargement. No vascular congestion. No pleural effusions. No pneumothorax. Mediastinal contours appear intact. Degenerative changes in the spine and shoulders. IMPRESSION: Shallow inspiration with atelectasis or infiltration in the lung bases, progressing since prior study. Electronically Signed   By: Lucienne Capers M.D.    On: 10/02/2022 21:36   DG Chest Port 1 View  Result Date: 09/30/2022 CLINICAL DATA:  Provided history: Pneumonia. Shortness of breath. Bradycardia. EXAM: PORTABLE CHEST 1 VIEW COMPARISON:  Prior chest radiographs 09/29/2022 and earlier. FINDINGS: Heart size within normal limits. Aortic atherosclerosis. Chronic, mild elevation of the left hemidiaphragm. Persistent ill-defined opacity at the left lung base, which may reflect atelectasis or pneumonia. Mild atelectasis within the right mid and lower lung fields. No appreciable airspace consolidation on the right. No evidence of pleural effusion or pneumothorax. No acute bony abnormality identified. IMPRESSION: Persistent ill-defined opacity within the left lung base, which may reflect atelectasis or pneumonia. Mild atelectasis within the right mid and lower lung fields. Chronic, mild elevation of the left hemidiaphragm. Aortic Atherosclerosis (ICD10-I70.0). Electronically Signed   By: Kellie Simmering D.O.   On: 09/30/2022 08:30   ECHOCARDIOGRAM LIMITED  Result Date: 09/29/2022    ECHOCARDIOGRAM LIMITED REPORT   Patient Name:   TIGERLILY LUMIA Date of Exam: 09/29/2022 Medical Rec #:  [id number]    Height:       65.0 in Accession #:    [id number]   Weight:       114.6 lb Date of Birth:  23-Feb-1956  BSA:          1.561 m Patient Age:    5 years     BP:           135/58 mmHg Patient Gender: F            HR:           62 bpm. Exam Location:  Inpatient Procedure: Limited Echo, 3D Echo, Cardiac Doppler and Color Doppler Indications:    Abnormal ECG R94.31  History:        Patient has prior history of Echocardiogram examinations, most                 recent 07/28/2022. Chronic hypotension. Failure to thrive.  Sonographer:    Darlina Sicilian RDCS Referring Phys: [place]  1. Left ventricular ejection fraction by 3D volume is 64 %. Left ventricular diastolic parameters are consistent with Grade II diastolic dysfunction (pseudonormalization).  2. Right  ventricular systolic function is normal. The right ventricular size is normal. There is mildly elevated pulmonary artery systolic pressure. The estimated right ventricular systolic pressure is Q000111Q mmHg.  3. Left atrial size was severely dilated.  4. Right atrial size was mildly dilated.  5. The mitral valve is grossly normal. Trivial mitral valve regurgitation. No evidence of mitral stenosis.  6. The aortic valve is tricuspid. Aortic valve regurgitation is trivial. No aortic stenosis is present.  7. The inferior vena cava is normal in size with greater than 50% respiratory variability, suggesting right atrial pressure of 3 mmHg. FINDINGS  Left Ventricle: Left ventricular ejection fraction by 3D volume is 64 %. Left ventricular diastolic parameters are consistent with Grade II diastolic dysfunction (pseudonormalization). Right Ventricle: The right ventricular size is normal. No increase in right ventricular wall thickness. Right ventricular systolic function is normal. There is mildly elevated pulmonary artery systolic pressure. The tricuspid regurgitant velocity is 2.92  m/s, and with an assumed right atrial pressure of 3 mmHg, the estimated right ventricular systolic pressure is Q000111Q mmHg. Left Atrium: Left atrial size was severely dilated. Right Atrium: Right atrial size was mildly dilated. Prominent Eustachian valve. Pericardium: Trivial pericardial effusion is present. Mitral Valve: The mitral valve is grossly normal. Trivial mitral valve regurgitation. No evidence of mitral valve stenosis. Tricuspid Valve: The tricuspid valve is normal in structure. Tricuspid valve regurgitation is mild . No evidence of tricuspid stenosis. Aortic Valve: The aortic valve is tricuspid. Aortic valve regurgitation is trivial. No aortic stenosis is present. Venous: The inferior vena cava is normal in size with greater than 50% respiratory variability, suggesting right atrial pressure of 3 mmHg. LEFT VENTRICLE PLAX 2D LVIDd:          4.60 cm         Diastology LVIDs:         2.10 cm         LV e' medial:    3.99 cm/s LV PW:         0.90 cm         LV E/e' medial:  21.1 LV IVS:        0.90 cm         LV e' lateral:   5.22 cm/s LVOT diam:     1.70 cm         LV E/e' lateral: 16.1 LV SV:         46 LV SV Index:   30 LVOT Area:     2.27 cm  3D Volume EF                                LV 3D EF:    Left                                             ventricul                                             ar                                             ejection                                             fraction                                             by 3D                                             volume is                                             64 %.                                 3D Volume EF:                                3D EF:        64 %                                LV EDV:       129 ml                                LV ESV:       46 ml                                LV SV:        83 ml LEFT ATRIUM           Index LA diam:      4.10 cm 2.63 cm/m LA Vol (A2C): 88.3 ml 56.58 ml/m LA Vol (A4C): 88.4 ml 56.63 ml/m  AORTIC  VALVE LVOT Vmax:   122.00 cm/s LVOT Vmean:  85.500 cm/s LVOT VTI:    0.203 m MITRAL VALVE               TRICUSPID VALVE MV Area (PHT): 2.79 cm    TR Peak grad:   34.1 mmHg MV Decel Time: 272 msec    TR Vmax:        292.00 cm/s MV E velocity: 84.00 cm/s MV Alayla Dethlefs velocity: 45.40 cm/s  SHUNTS MV E/Zamzam Whinery ratio:  1.85        Systemic VTI:  0.20 m                            Systemic Diam: 1.70 cm Cherlynn Kaiser MD Electronically signed by Cherlynn Kaiser MD Signature Date/Time: 09/29/2022/2:24:49 PM    Final    [place] EKG SITE RITE  Result Date: 09/29/2022 If Site Rite image not attached, placement could not be confirmed due to current cardiac rhythm.  CT ABDOMEN PELVIS WO CONTRAST  Result Date: 09/29/2022 CLINICAL DATA:  Nausea/vomiting EXAM: CT ABDOMEN AND PELVIS WITHOUT CONTRAST TECHNIQUE: Multidetector CT imaging of  the abdomen and pelvis was performed following the standard protocol without IV contrast. RADIATION DOSE REDUCTION: This exam was performed according to the departmental dose-optimization program which includes automated exposure control, adjustment of the mA and/or kV according to patient size and/or use of iterative reconstruction technique. COMPARISON:  05/08/2016 FINDINGS: Lower chest: Moderate left and small right pleural effusion. Associated bilateral lower lobe atelectasis. Eventration left hemidiaphragm. Hepatobiliary: Unenhanced liver is unremarkable. Gallbladder is mildly distended. No cholelithiasis or associated inflammatory changes. No intrahepatic or extrahepatic ductal dilatation. Pancreas: Within normal limits. Spleen: Within normal limits. Adrenals/Urinary Tract: Adrenal glands are within normal limits. Kidneys are within normal limits. No renal, ureteral, or bladder calculi. No hydronephrosis. Bladder is within normal limits. Stomach/Bowel: Stomach is within normal limits. No evidence of bowel obstruction. Normal appendix (series 3/image 88). Sigmoid diverticulosis with chronic mucosal hypertrophy. No evidence of diverticulitis. Vascular/Lymphatic: No evidence of abdominal aortic aneurysm. Atherosclerotic calcifications of the abdominal aorta and branch vessels. No suspicious abdominopelvic lymphadenopathy. Reproductive: Uterus is within normal limits. No adnexal masses. Other: No abdominopelvic ascites. Musculoskeletal: Mild degenerative changes of the visualized thoracolumbar spine. Status post PLIF at L5-S1. IMPRESSION: No acute findings in the abdomen/pelvis. Sigmoid diverticulosis, without evidence of diverticulitis. Moderate left and small right pleural effusions. Associated bilateral lower lobe atelectasis. Electronically Signed   By: Julian Hy M.D.   On: 09/29/2022 03:26   CT HEAD WO CONTRAST (5MM)  Result Date: 09/29/2022 CLINICAL DATA:  Neuro deficit EXAM: CT HEAD WITHOUT  CONTRAST TECHNIQUE: Contiguous axial images were obtained from the base of the skull through the vertex without intravenous contrast. RADIATION DOSE REDUCTION: This exam was performed according to the departmental dose-optimization program which includes automated exposure control, adjustment of the mA and/or kV according to patient size and/or use of iterative reconstruction technique. COMPARISON:  07/18/2022 FINDINGS: Brain: No evidence of acute infarction, hemorrhage, hydrocephalus, extra-axial collection or mass lesion/mass effect. Mild atrophic changes are noted. Vascular: No hyperdense vessel or unexpected calcification. Skull: Normal. Negative for fracture or focal lesion. Sinuses/Orbits: No acute finding. Other: None IMPRESSION: Chronic atrophic changes without acute abnormality. Electronically Signed   By: Inez Catalina M.D.   On: 09/29/2022 03:21   DG Chest Port 1 View  Result Date: 09/29/2022 CLINICAL DATA:  Cough and altered mental status. EXAM: PORTABLE CHEST 1 VIEW  COMPARISON:  July 27, 2022 FINDINGS: The heart size and mediastinal contours are within normal limits. There is moderate severity calcification of the aortic arch. Mild atelectasis and/or early infiltrate is seen within the left lung base. There is no evidence of Juwaun Inskeep pleural effusion or pneumothorax. The visualized skeletal structures are unremarkable. IMPRESSION: Mild left basilar atelectasis and/or early infiltrate. Electronically Signed   By: Virgina Norfolk M.D.   On: 09/29/2022 00:31    Microbiology: Recent Results (from the past 240 hour(s))  Culture, blood (Routine X 2) w Reflex to ID Panel     Status: None   Collection Time: 09/29/22 12:24 AM   Specimen: BLOOD LEFT WRIST  Result Value Ref Range Status   Specimen Description BLOOD LEFT WRIST  Final   Special Requests   Final    BOTTLES DRAWN AEROBIC AND ANAEROBIC Blood Culture adequate volume   Culture   Final    NO GROWTH 5 DAYS Performed at [facility],  [address] [place], [place] [address]    Report Status 10/04/2022 FINAL  Final  Culture, blood (Routine X 2) w Reflex to ID Panel     Status: None   Collection Time: 09/29/22 12:40 AM   Specimen: BLOOD RIGHT HAND  Result Value Ref Range Status   Specimen Description BLOOD RIGHT HAND  Final   Special Requests   Final    BOTTLES DRAWN AEROBIC AND ANAEROBIC Blood Culture adequate volume   Culture   Final    NO GROWTH 5 DAYS Performed at [facility], [place] [address] [place], [place] [address]    Report Status 10/04/2022 FINAL  Final  MRSA Next Gen by PCR, Nasal     Status: None   Collection Time: 09/29/22  5:59 AM   Specimen: Nasal Mucosa; Nasal Swab  Result Value Ref Range Status   MRSA by PCR Next Gen NOT DETECTED NOT DETECTED Final    Comment: (NOTE) The GeneXpert MRSA Assay (FDA approved for NASAL specimens only), is one component of Corrissa Martello comprehensive MRSA colonization surveillance program. It is not intended to diagnose MRSA infection nor to guide or monitor treatment for MRSA infections. Test performance is not FDA approved in patients less than 36 years old. Performed at [facility], [place] [address] [place],  [address]      Labs: Basic Metabolic Panel: Recent Labs  Lab 09/30/22 [phone] 10/01/22 0137 10/02/22 0218 10/02/22 2118 10/03/22 0209 10/04/22 0106  NA 132* 133* 131* 132* 133* 134*  K 5.0 4.7 4.5 4.4 4.2 3.9  CL 106 106 102 99 97* 98  CO2 16* 19* 21* 14* 26 25  GLUCOSE 115* 100* 102* 133* 112* 113*  BUN 10 8 13 16 15 18   CREATININE 0.64 0.82 0.82 1.09* 0.89 0.95  CALCIUM 8.9 8.9 8.6* 9.1 8.7* 8.8*  MG 1.9  --  1.6* 2.2 1.8 1.8  PHOS 1.7*  --  3.2 4.8* 3.7 4.3   Liver Function Tests: Recent Labs  Lab 09/29/22 0024 10/02/22 0218 10/02/22 2118 10/03/22 0209 10/04/22 0106  AST 16 15 25 16  13*  ALT 10 14 14 12 10   ALKPHOS 144* 192* 204* 167* 149*  BILITOT 0.9 0.3 0.5 0.4 0.2*  PROT 5.8* 5.1* 6.0* 5.8* 5.3*  ALBUMIN 2.8* 2.4* 2.9*  2.6* 2.5*   Recent Labs  Lab 09/29/22 0024  LIPASE 24   No results for input(s): "AMMONIA" in the last 168 hours. CBC: Recent Labs  Lab 10/01/22 0137  10/02/22 0218 10/02/22 2118 10/03/22 0209 10/04/22 0106  WBC 13.9* 11.9* 21.0* 17.6* 11.8*  NEUTROABS 9.6* 7.8* 11.9* 13.5* 6.3  HGB 10.5* 10.8* 13.7 11.9* 11.0*  HCT 32.4* 33.1* 43.4 37.3 35.4*  MCV 85.9 85.5 88.6 85.9 86.3  PLT 498* 489* 774* 574* 575*   Cardiac Enzymes: Recent Labs  Lab 09/29/22 0024  CKTOTAL 30*   BNP: BNP (last 3 results) Recent Labs    10/03/22 0209  BNP 1,280.2*    ProBNP (last 3 results) No results for input(s): "PROBNP" in the last 8760 hours.  CBG: Recent Labs  Lab 10/03/22 1225 10/03/22 1559 10/03/22 2056 10/04/22 0738 10/04/22 1158  GLUCAP 113* 117* 128* 109* 96       Signed:  Fayrene Helper MD.  [facility] 10/04/2022, 3:01 PM

## 2022-10-04 NOTE — Plan of Care (Signed)

## 2022-10-04 NOTE — TOC Initial Note (Addendum)
Transition of Care [facility]) - Initial/Assessment Note    Patient Details  Name: Jamie [name] MRN: [id number] Date of Birth: 09-17-1956  [facility] CM/SW Contact:    Bethena Roys, RN Phone Number: 10/04/2022, 3:47 PM  Clinical Narrative: Risk for Readmission Assessment completed. Case Manager spoke with patient and she is agreeable to home health services via [place]. Medicare.gov list discussed. Referral submitted to [facility] and they can service the patient. The office will call the patient and discuss start of care. Patient had questions regarding personal care services and the patient is aware that the patient and family will have to find those agencies. Case Manager did discuss the cost with the patient. Patient states she will discuss personal care with her daughter. Patient in need of transportation home. Speaking with Lead Supervisor to get her home. No further needs from Case Manager at this time.                  Expected Discharge Plan: [place] Barriers to Discharge: No Barriers Identified   Patient Goals and CMS Choice Patient states their goals for this hospitalization and ongoing recovery are:: to return home CMS Medicare.gov Compare Post Acute Care list provided to:: Patient Choice offered to / list presented to : Patient  Expected Discharge Plan and Services Expected Discharge Plan: [place] In-house Referral: NA Discharge Planning Services: CM Consult Post Acute Care Choice: [place] arrangements for the past 2 months: Single Family Home Expected Discharge Date: 10/04/22               DME Arranged: N/A DME Agency: NA       HH Arranged: RN, PT, OT HH Agency: [place] Date HH Agency Contacted: 10/04/22 Time HH Agency Contacted: 48 Representative spoke with at [place] Arrangements/Services Living arrangements for the past 2 months: [place] with:: Self Patient language and need for interpreter reviewed:: Yes Do you feel safe going back to the place where you live?: Yes      Need for Family Participation in Patient Care: Yes (Comment) Care giver support system in place?: Yes (comment) Current home services: DME (has rolling walker and bedside commode.) Criminal Activity/Legal Involvement Pertinent to Current Situation/Hospitalization: No - Comment as needed  Activities of Daily Living      Permission Sought/Granted Permission sought to share information with : Case Manager, Customer service manager, Family Supports Permission granted to share information with : Yes, Verbal Permission Granted     Permission granted to share info w AGENCY: [facility]        Emotional Assessment Appearance:: Appears stated age Attitude/Demeanor/Rapport: Engaged Affect (typically observed): Appropriate Orientation: : Oriented to Self, Oriented to Place, Oriented to  Time, Oriented to Situation Alcohol / Substance Use: Not Applicable Psych Involvement: No (comment)  Admission diagnosis:  Bradycardia [R00.1] Hypotension, unspecified hypotension type [I95.9] Nausea and vomiting, unspecified vomiting type [R11.2] Patient Active Problem List   Diagnosis Date Noted   Symptomatic bradycardia 09/29/2022   Malnutrition of moderate degree 09/29/2022   Hypotension    Physical deconditioning 07/29/2022   Shock circulatory ([place]) 07/29/2022   Chronic pain 07/28/2022   UTI (urinary tract infection) 07/28/2022   Chronic diarrhea 07/28/2022   Lactic acidosis 07/28/2022   Sinus bradycardia 07/27/2022   Seizure ([place]) 07/18/2022   Generalized weakness 05/04/2022   Hypoglycemia 05/04/2022   Hypokalemia 04/06/2022   Falls frequently 04/06/2022  Alcohol abuse 04/06/2022   Opiate dependence --Chronic pain 04/06/2022   Benzodiazepine dependence /Chronic anxiety 04/06/2022   Abdominal pain    Nausea vomiting and diarrhea 05/08/2016    Sepsis, unspecified organism (HCC) 05/08/2016   Migraine 05/08/2016   AKI (acute kidney injury) (HCC) 05/08/2016   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 [place] [address] Phone: [phone] Fax: [phone]  [facility] [address] [place] [address] Phone: [phone] Fax: [phone]  Readmission Risk Interventions    10/04/2022    3:42 PM 07/28/2022    1:55 PM  Readmission Risk Prevention Plan  Transportation Screening Complete Complete  Medication Review (RN Care Manager) Complete Complete  PCP or Specialist appointment within 3-5 days of discharge  Not Complete  HRI or Home Care Consult Complete Complete  SW Recovery Care/Counseling Consult Complete Complete  Palliative Care Screening Not Applicable Not Applicable  Skilled Nursing Facility Not Applicable Not Applicable

## 2022-10-05 ENCOUNTER — Other Ambulatory Visit (HOSPITAL_COMMUNITY): Payer: Self-pay

## 2022-10-07 ENCOUNTER — Encounter: Payer: Self-pay | Admitting: Neurology

## 2022-10-09 LAB — COPPER, SERUM: Copper: 126 ug/dL (ref 80–158)

## 2022-10-09 LAB — ZINC: Zinc: 50 ug/dL (ref 44–115)

## 2022-10-10 LAB — VITAMIN C

## 2022-10-24 ENCOUNTER — Ambulatory Visit: Payer: No Typology Code available for payment source | Admitting: Neurology

## 2022-10-31 ENCOUNTER — Emergency Department (HOSPITAL_COMMUNITY): Payer: Medicare HMO

## 2022-10-31 ENCOUNTER — Encounter (HOSPITAL_COMMUNITY): Payer: Self-pay | Admitting: *Deleted

## 2022-10-31 ENCOUNTER — Inpatient Hospital Stay (HOSPITAL_COMMUNITY): Payer: Medicare HMO

## 2022-10-31 ENCOUNTER — Other Ambulatory Visit: Payer: Self-pay

## 2022-10-31 ENCOUNTER — Inpatient Hospital Stay: Payer: Self-pay

## 2022-10-31 ENCOUNTER — Inpatient Hospital Stay (HOSPITAL_COMMUNITY)
Admission: EM | Admit: 2022-10-31 | Discharge: 2022-11-28 | DRG: 871 | Disposition: A | Discharge status: Skilled Nursing Facility | Payer: Medicare HMO | Attending: Internal Medicine | Admitting: Internal Medicine

## 2022-10-31 DIAGNOSIS — E440 Moderate protein-calorie malnutrition: Secondary | ICD-10-CM | POA: Diagnosis present

## 2022-10-31 DIAGNOSIS — G4710 Hypersomnia, unspecified: Secondary | ICD-10-CM | POA: Diagnosis present

## 2022-10-31 DIAGNOSIS — Z7189 Other specified counseling: Secondary | ICD-10-CM | POA: Diagnosis not present

## 2022-10-31 DIAGNOSIS — G40909 Epilepsy, unspecified, not intractable, without status epilepticus: Secondary | ICD-10-CM | POA: Diagnosis present

## 2022-10-31 DIAGNOSIS — F32A Depression, unspecified: Secondary | ICD-10-CM | POA: Diagnosis present

## 2022-10-31 DIAGNOSIS — L8990 Pressure ulcer of unspecified site, unspecified stage: Secondary | ICD-10-CM | POA: Insufficient documentation

## 2022-10-31 DIAGNOSIS — M40204 Unspecified kyphosis, thoracic region: Secondary | ICD-10-CM | POA: Diagnosis present

## 2022-10-31 DIAGNOSIS — K8689 Other specified diseases of pancreas: Secondary | ICD-10-CM | POA: Diagnosis present

## 2022-10-31 DIAGNOSIS — A419 Sepsis, unspecified organism: Secondary | ICD-10-CM | POA: Diagnosis present

## 2022-10-31 DIAGNOSIS — W19XXXA Unspecified fall, initial encounter: Secondary | ICD-10-CM | POA: Diagnosis present

## 2022-10-31 DIAGNOSIS — J189 Pneumonia, unspecified organism: Secondary | ICD-10-CM | POA: Diagnosis not present

## 2022-10-31 DIAGNOSIS — Z23 Encounter for immunization: Secondary | ICD-10-CM | POA: Diagnosis present

## 2022-10-31 DIAGNOSIS — K8050 Calculus of bile duct without cholangitis or cholecystitis without obstruction: Secondary | ICD-10-CM | POA: Diagnosis not present

## 2022-10-31 DIAGNOSIS — L89152 Pressure ulcer of sacral region, stage 2: Secondary | ICD-10-CM | POA: Diagnosis present

## 2022-10-31 DIAGNOSIS — I5032 Chronic diastolic (congestive) heart failure: Secondary | ICD-10-CM | POA: Diagnosis present

## 2022-10-31 DIAGNOSIS — R935 Abnormal findings on diagnostic imaging of other abdominal regions, including retroperitoneum: Secondary | ICD-10-CM | POA: Diagnosis not present

## 2022-10-31 DIAGNOSIS — R001 Bradycardia, unspecified: Secondary | ICD-10-CM | POA: Diagnosis present

## 2022-10-31 DIAGNOSIS — M069 Rheumatoid arthritis, unspecified: Secondary | ICD-10-CM | POA: Diagnosis present

## 2022-10-31 DIAGNOSIS — G8929 Other chronic pain: Secondary | ICD-10-CM | POA: Diagnosis not present

## 2022-10-31 DIAGNOSIS — R109 Unspecified abdominal pain: Secondary | ICD-10-CM | POA: Diagnosis not present

## 2022-10-31 DIAGNOSIS — K8042 Calculus of bile duct with acute cholecystitis without obstruction: Secondary | ICD-10-CM | POA: Diagnosis present

## 2022-10-31 DIAGNOSIS — F1120 Opioid dependence, uncomplicated: Secondary | ICD-10-CM | POA: Diagnosis present

## 2022-10-31 DIAGNOSIS — R64 Cachexia: Secondary | ICD-10-CM | POA: Diagnosis present

## 2022-10-31 DIAGNOSIS — K251 Acute gastric ulcer with perforation: Secondary | ICD-10-CM | POA: Diagnosis present

## 2022-10-31 DIAGNOSIS — E876 Hypokalemia: Secondary | ICD-10-CM | POA: Diagnosis present

## 2022-10-31 DIAGNOSIS — Z6820 Body mass index (BMI) 20.0-20.9, adult: Secondary | ICD-10-CM

## 2022-10-31 DIAGNOSIS — R569 Unspecified convulsions: Secondary | ICD-10-CM | POA: Diagnosis not present

## 2022-10-31 DIAGNOSIS — Z7982 Long term (current) use of aspirin: Secondary | ICD-10-CM

## 2022-10-31 DIAGNOSIS — D62 Acute posthemorrhagic anemia: Secondary | ICD-10-CM | POA: Diagnosis present

## 2022-10-31 DIAGNOSIS — K255 Chronic or unspecified gastric ulcer with perforation: Secondary | ICD-10-CM | POA: Diagnosis present

## 2022-10-31 DIAGNOSIS — E669 Obesity, unspecified: Secondary | ICD-10-CM | POA: Diagnosis present

## 2022-10-31 DIAGNOSIS — R1084 Generalized abdominal pain: Secondary | ICD-10-CM | POA: Diagnosis not present

## 2022-10-31 DIAGNOSIS — Z8619 Personal history of other infectious and parasitic diseases: Secondary | ICD-10-CM

## 2022-10-31 DIAGNOSIS — R188 Other ascites: Secondary | ICD-10-CM | POA: Diagnosis not present

## 2022-10-31 DIAGNOSIS — Z66 Do not resuscitate: Secondary | ICD-10-CM | POA: Diagnosis present

## 2022-10-31 DIAGNOSIS — K668 Other specified disorders of peritoneum: Secondary | ICD-10-CM | POA: Diagnosis not present

## 2022-10-31 DIAGNOSIS — G4700 Insomnia, unspecified: Secondary | ICD-10-CM | POA: Diagnosis present

## 2022-10-31 DIAGNOSIS — R6521 Severe sepsis with septic shock: Secondary | ICD-10-CM | POA: Diagnosis present

## 2022-10-31 DIAGNOSIS — M545 Low back pain, unspecified: Secondary | ICD-10-CM | POA: Diagnosis present

## 2022-10-31 DIAGNOSIS — T402X5A Adverse effect of other opioids, initial encounter: Secondary | ICD-10-CM | POA: Diagnosis present

## 2022-10-31 DIAGNOSIS — K838 Other specified diseases of biliary tract: Secondary | ICD-10-CM | POA: Diagnosis not present

## 2022-10-31 DIAGNOSIS — Z888 Allergy status to other drugs, medicaments and biological substances status: Secondary | ICD-10-CM

## 2022-10-31 DIAGNOSIS — R52 Pain, unspecified: Secondary | ICD-10-CM | POA: Diagnosis not present

## 2022-10-31 DIAGNOSIS — Z515 Encounter for palliative care: Secondary | ICD-10-CM

## 2022-10-31 DIAGNOSIS — K5903 Drug induced constipation: Secondary | ICD-10-CM | POA: Diagnosis present

## 2022-10-31 DIAGNOSIS — G894 Chronic pain syndrome: Secondary | ICD-10-CM | POA: Diagnosis present

## 2022-10-31 DIAGNOSIS — R7401 Elevation of levels of liver transaminase levels: Secondary | ICD-10-CM | POA: Diagnosis present

## 2022-10-31 DIAGNOSIS — K659 Peritonitis, unspecified: Secondary | ICD-10-CM | POA: Diagnosis present

## 2022-10-31 DIAGNOSIS — F1010 Alcohol abuse, uncomplicated: Secondary | ICD-10-CM | POA: Diagnosis present

## 2022-10-31 DIAGNOSIS — Z765 Malingerer [conscious simulation]: Secondary | ICD-10-CM

## 2022-10-31 DIAGNOSIS — F1011 Alcohol abuse, in remission: Secondary | ICD-10-CM | POA: Diagnosis present

## 2022-10-31 DIAGNOSIS — R627 Adult failure to thrive: Secondary | ICD-10-CM | POA: Diagnosis present

## 2022-10-31 DIAGNOSIS — E872 Acidosis, unspecified: Secondary | ICD-10-CM | POA: Diagnosis present

## 2022-10-31 DIAGNOSIS — R1011 Right upper quadrant pain: Secondary | ICD-10-CM | POA: Diagnosis not present

## 2022-10-31 DIAGNOSIS — R0781 Pleurodynia: Secondary | ICD-10-CM | POA: Diagnosis not present

## 2022-10-31 DIAGNOSIS — Z881 Allergy status to other antibiotic agents status: Secondary | ICD-10-CM

## 2022-10-31 DIAGNOSIS — K8070 Calculus of gallbladder and bile duct without cholecystitis without obstruction: Secondary | ICD-10-CM | POA: Diagnosis not present

## 2022-10-31 DIAGNOSIS — F411 Generalized anxiety disorder: Secondary | ICD-10-CM | POA: Diagnosis present

## 2022-10-31 DIAGNOSIS — R198 Other specified symptoms and signs involving the digestive system and abdomen: Secondary | ICD-10-CM | POA: Diagnosis not present

## 2022-10-31 DIAGNOSIS — K580 Irritable bowel syndrome with diarrhea: Secondary | ICD-10-CM | POA: Diagnosis present

## 2022-10-31 DIAGNOSIS — G43909 Migraine, unspecified, not intractable, without status migrainosus: Secondary | ICD-10-CM | POA: Diagnosis present

## 2022-10-31 DIAGNOSIS — M4316 Spondylolisthesis, lumbar region: Secondary | ICD-10-CM | POA: Diagnosis present

## 2022-10-31 DIAGNOSIS — J90 Pleural effusion, not elsewhere classified: Secondary | ICD-10-CM | POA: Diagnosis not present

## 2022-10-31 DIAGNOSIS — Z981 Arthrodesis status: Secondary | ICD-10-CM

## 2022-10-31 LAB — COMPREHENSIVE METABOLIC PANEL
ALT: 86 U/L — ABNORMAL HIGH (ref 0–44)
AST: 187 U/L — ABNORMAL HIGH (ref 15–41)
Albumin: 3.7 g/dL (ref 3.5–5.0)
Alkaline Phosphatase: 263 U/L — ABNORMAL HIGH (ref 38–126)
Anion gap: 12 (ref 5–15)
BUN: 23 mg/dL (ref 8–23)
CO2: 14 mmol/L — ABNORMAL LOW (ref 22–32)
Calcium: 8.7 mg/dL — ABNORMAL LOW (ref 8.9–10.3)
Chloride: 110 mmol/L (ref 98–111)
Creatinine, Ser: 0.98 mg/dL (ref 0.44–1.00)
GFR, Estimated: >60 mL/min (ref >60)
Glucose, Bld: 97 mg/dL (ref 70–99)
Potassium: 3.5 mmol/L (ref 3.5–5.1)
Sodium: 136 mmol/L (ref 135–145)
Total Bilirubin: 0.5 mg/dL (ref 0.3–1.2)
Total Protein: 7.1 g/dL (ref 6.5–8.1)

## 2022-10-31 LAB — CBC WITH DIFFERENTIAL/PLATELET
Abs Immature Granulocytes: 0.19 10*3/uL — ABNORMAL HIGH (ref 0.00–0.07)
Basophils Absolute: 0.1 10*3/uL (ref 0.0–0.1)
Basophils Relative: 0 %
Eosinophils Absolute: 0 10*3/uL (ref 0.0–0.5)
Eosinophils Relative: 0 %
HCT: 37.3 % (ref 36.0–46.0)
Hemoglobin: 11.4 g/dL — ABNORMAL LOW (ref 12.0–15.0)
Immature Granulocytes: 1 %
Lymphocytes Relative: 2 %
Lymphs Abs: 0.6 10*3/uL — ABNORMAL LOW (ref 0.7–4.0)
MCH: 25.4 pg — ABNORMAL LOW (ref 26.0–34.0)
MCHC: 30.6 g/dL (ref 30.0–36.0)
MCV: 83.3 fL (ref 80.0–100.0)
Monocytes Absolute: 1.2 10*3/uL — ABNORMAL HIGH (ref 0.1–1.0)
Monocytes Relative: 4 %
Neutro Abs: 27.7 10*3/uL — ABNORMAL HIGH (ref 1.7–7.7)
Neutrophils Relative %: 93 %
Platelets: 461 10*3/uL — ABNORMAL HIGH (ref 150–400)
RBC: 4.48 MIL/uL (ref 3.87–5.11)
RDW: 15.3 % (ref 11.5–15.5)
WBC: 29.7 10*3/uL — ABNORMAL HIGH (ref 4.0–10.5)
nRBC: 0 % (ref 0.0–0.2)

## 2022-10-31 LAB — URINALYSIS, ROUTINE W REFLEX MICROSCOPIC
Bilirubin Urine: NEGATIVE
Glucose, UA: NEGATIVE mg/dL
Hgb urine dipstick: NEGATIVE
Ketones, ur: NEGATIVE mg/dL
Leukocytes,Ua: NEGATIVE
Nitrite: NEGATIVE
Protein, ur: NEGATIVE mg/dL
Specific Gravity, Urine: 1.044 — ABNORMAL HIGH (ref 1.005–1.030)
pH: 5 (ref 5.0–8.0)

## 2022-10-31 LAB — TROPONIN I (HIGH SENSITIVITY)
Troponin I (High Sensitivity): 4 ng/L (ref <18)
Troponin I (High Sensitivity): 4 ng/L (ref <18)

## 2022-10-31 LAB — RAPID URINE DRUG SCREEN, HOSP PERFORMED
Amphetamines: NOT DETECTED
Barbiturates: POSITIVE — AB
Benzodiazepines: POSITIVE — AB
Cocaine: NOT DETECTED
Opiates: NOT DETECTED
Tetrahydrocannabinol: NOT DETECTED

## 2022-10-31 LAB — LACTIC ACID, PLASMA
Lactic Acid, Venous: 2 mmol/L (ref 0.5–1.9)
Lactic Acid, Venous: 2.1 mmol/L (ref 0.5–1.9)

## 2022-10-31 MED ORDER — SODIUM CHLORIDE 0.9 % IV SOLN
2.0000 g | Freq: Two times a day (BID) | INTRAVENOUS | Status: DC
  Administered 2022-10-31 – 2022-11-01 (×3): 2 g via INTRAVENOUS
  Filled 2022-10-31 (×3): qty 12.5

## 2022-10-31 MED ORDER — SODIUM CHLORIDE 0.9 % IV SOLN: 250.0000 mL | INTRAVENOUS | Status: DC

## 2022-10-31 MED ORDER — FENTANYL CITRATE PF 50 MCG/ML IJ SOSY
50.0000 ug | PREFILLED_SYRINGE | Freq: Once | INTRAMUSCULAR | Status: AC
  Administered 2022-10-31: 50 ug via INTRAVENOUS
  Filled 2022-10-31: qty 1

## 2022-10-31 MED ORDER — METRONIDAZOLE 500 MG/100ML IV SOLN
500.0000 mg | Freq: Once | INTRAVENOUS | Status: AC
  Administered 2022-10-31: 500 mg via INTRAVENOUS
  Filled 2022-10-31: qty 100

## 2022-10-31 MED ORDER — ONDANSETRON HCL 4 MG/2ML IJ SOLN
4.0000 mg | Freq: Once | INTRAMUSCULAR | Status: AC
  Administered 2022-10-31: 4 mg via INTRAVENOUS
  Filled 2022-10-31: qty 2

## 2022-10-31 MED ORDER — LACTATED RINGERS IV BOLUS
1000.0000 mL | Freq: Once | INTRAVENOUS | Status: AC
  Administered 2022-10-31: 1000 mL via INTRAVENOUS

## 2022-10-31 MED ORDER — ONDANSETRON 4 MG PO TBDP
4.0000 mg | ORAL_TABLET | Freq: Four times a day (QID) | ORAL | Status: DC | PRN
  Administered 2022-11-02 – 2022-11-06 (×6): 4 mg via ORAL
  Filled 2022-10-31 (×6): qty 1

## 2022-10-31 MED ORDER — DIAZEPAM 5 MG PO TABS
ORAL_TABLET | ORAL | Status: AC
  Filled 2022-10-31: qty 2

## 2022-10-31 MED ORDER — ACETAMINOPHEN 325 MG PO TABS
650.0000 mg | ORAL_TABLET | Freq: Four times a day (QID) | ORAL | Status: DC | PRN
  Administered 2022-11-02 – 2022-11-05 (×8): 650 mg via ORAL
  Filled 2022-10-31 (×9): qty 2

## 2022-10-31 MED ORDER — ACETAMINOPHEN 650 MG RE SUPP: 650.0000 mg | Freq: Four times a day (QID) | RECTAL | Status: DC | PRN

## 2022-10-31 MED ORDER — ENOXAPARIN SODIUM 40 MG/0.4ML IJ SOSY
40.0000 mg | PREFILLED_SYRINGE | INTRAMUSCULAR | Status: DC
  Administered 2022-10-31 – 2022-11-06 (×7): 40 mg via SUBCUTANEOUS
  Filled 2022-10-31 (×7): qty 0.4

## 2022-10-31 MED ORDER — LACTATED RINGERS IV SOLN: INTRAVENOUS | Status: DC

## 2022-10-31 MED ORDER — HYDROMORPHONE HCL 1 MG/ML IJ SOLN
1.0000 mg | INTRAMUSCULAR | Status: DC | PRN
  Administered 2022-10-31 – 2022-11-06 (×44): 1 mg via INTRAVENOUS
  Filled 2022-10-31 (×44): qty 1

## 2022-10-31 MED ORDER — METRONIDAZOLE 500 MG/100ML IV SOLN
500.0000 mg | Freq: Two times a day (BID) | INTRAVENOUS | Status: DC
  Administered 2022-11-01 – 2022-11-06 (×12): 500 mg via INTRAVENOUS
  Filled 2022-10-31 (×12): qty 100

## 2022-10-31 MED ORDER — NOREPINEPHRINE 4 MG/250ML-% IV SOLN
2.0000 ug/min | INTRAVENOUS | Status: DC
  Administered 2022-10-31: 2 ug/min via INTRAVENOUS
  Filled 2022-10-31: qty 250

## 2022-10-31 MED ORDER — IOHEXOL 300 MG/ML  SOLN
80.0000 mL | Freq: Once | INTRAMUSCULAR | Status: AC | PRN
  Administered 2022-10-31: 80 mL via INTRAVENOUS

## 2022-10-31 MED ORDER — ONDANSETRON HCL 4 MG/2ML IJ SOLN
4.0000 mg | Freq: Four times a day (QID) | INTRAMUSCULAR | Status: DC | PRN
  Administered 2022-11-01 – 2022-11-06 (×12): 4 mg via INTRAVENOUS
  Filled 2022-10-31 (×12): qty 2

## 2022-10-31 MED ORDER — DIAZEPAM 2 MG PO TABS
2.0000 mg | ORAL_TABLET | Freq: Once | ORAL | Status: AC
  Administered 2022-10-31: 2 mg via ORAL
  Filled 2022-10-31: qty 1

## 2022-10-31 MED ORDER — PNEUMOCOCCAL 20-VAL CONJ VACC 0.5 ML IM SUSY: 0.5000 mL | PREFILLED_SYRINGE | INTRAMUSCULAR | Status: DC

## 2022-10-31 MED ORDER — PANTOPRAZOLE SODIUM 40 MG IV SOLR
40.0000 mg | Freq: Every day | INTRAVENOUS | Status: DC
  Administered 2022-10-31: 40 mg via INTRAVENOUS
  Filled 2022-10-31: qty 10

## 2022-10-31 MED ORDER — INFLUENZA VAC A&B SA ADJ QUAD 0.5 ML IM PRSY
0.5000 mL | PREFILLED_SYRINGE | INTRAMUSCULAR | Status: AC
  Administered 2022-11-01: 0.5 mL via INTRAMUSCULAR
  Filled 2022-10-31: qty 0.5

## 2022-10-31 MED ORDER — CHLORHEXIDINE GLUCONATE CLOTH 2 % EX PADS
6.0000 | MEDICATED_PAD | Freq: Every day | CUTANEOUS | Status: DC
  Administered 2022-10-31 – 2022-11-06 (×7): 6 via TOPICAL

## 2022-10-31 MED ORDER — SODIUM CHLORIDE 0.9 % IV SOLN
2.0000 g | Freq: Once | INTRAVENOUS | Status: AC
  Administered 2022-10-31: 2 g via INTRAVENOUS
  Filled 2022-10-31: qty 12.5

## 2022-10-31 MED ORDER — LORAZEPAM 2 MG/ML IJ SOLN
1.0000 mg | INTRAMUSCULAR | Status: DC | PRN
  Administered 2022-10-31 – 2022-11-01 (×4): 1 mg via INTRAVENOUS
  Filled 2022-10-31 (×4): qty 1

## 2022-10-31 MED ORDER — EPINEPHRINE HCL 5 MG/250ML IV SOLN IN NS
0.5000 ug/min | INTRAVENOUS | Status: DC
  Filled 2022-10-31: qty 250

## 2022-10-31 NOTE — Consult Note (Signed)
Reason for Consult: Acute abdominal pain Referring Physician: Dr. Durwin Nora, ER  Jamie [name] is an 66 y.o. female.  HPI: Patient is a 66 year old white female who presented to the emergency room today with a greater than 24-hour history of abdominal pain.  She states that she started having abdominal pain yesterday.  It was acute on onset.  Since that time, she has had persistent abdominal pain.  She states her pain is mostly on the left upper portion of the abdomen.  It is not worsened.  She denies any nausea or vomiting.  She does take occasional BC powders for headaches.  She denies any hematemesis, hematochezia.  She does not remember when her last bowel movement was.  A CT scan of the abdomen pelvis was performed which revealed multiple findings including a speck of free air high up above the liver with thickened cardia and antral stomach areas.  In addition, she has a dilated hepatobiliary tree with a possible 4 mm common bile duct stone distally.  No stones are noted within the gallbladder.  She also has a dilated pancreatic duct.  She was just discharged from [facility] in early October with hypotension due to bradycardia and relative diastolic heart failure.  Urine is negative for alcohol.  It is positive for amphetamines and barbiturates.  Past Medical History:  Diagnosis Date   Anxiety    Arthritis    Chronic pain    Gastroenteritis    Headache    Hypotension    Insomnia    Suicidal ideation     Past Surgical History:  Procedure Laterality Date   BACK SURGERY     Lumbar Fusion   CARPAL TUNNEL RELEASE     LAPAROSCOPIC OOPHERECTOMY     MOUTH SURGERY      Family History  Problem Relation Age of Onset   Colon cancer Neg Hx     Social History:  reports that she has never smoked. She has never used smokeless tobacco. She reports that she does not currently use alcohol. She reports that she does not use drugs.  Allergies:  Allergies  Allergen Reactions   Almond Oil Itching    Amoxicillin-Pot Clavulanate Nausea And Vomiting and Other (See Comments)    Can tolerate plain Amoxicillin (??)   Azithromycin Nausea And Vomiting   Bupropion Nausea And Vomiting   Nsaids Other (See Comments)    Stomach problems   Prednisone Diarrhea   Vilazodone Diarrhea and Other (See Comments)    Viibryd    Medications: Prior to Admission: (Not in a hospital admission)   Results for orders placed or performed during the hospital encounter of 10/31/22 (from the past 48 hour(s))  Comprehensive metabolic panel     Status: Abnormal   Collection Time: 10/31/22  1:17 PM  Result Value Ref Range   Sodium 136 135 - 145 mmol/L   Potassium 3.5 3.5 - 5.1 mmol/L   Chloride 110 98 - 111 mmol/L   CO2 14 (L) 22 - 32 mmol/L   Glucose, Bld 97 70 - 99 mg/dL    Comment: Glucose reference range applies only to samples taken after fasting for at least 8 hours.   BUN 23 8 - 23 mg/dL   Creatinine, Ser 4.09 0.44 - 1.00 mg/dL   Calcium 8.7 (L) 8.9 - 10.3 mg/dL   Total Protein 7.1 6.5 - 8.1 g/dL   Albumin 3.7 3.5 - 5.0 g/dL   AST 811 (H) 15 - 41 U/L   ALT 86 (  H) 0 - 44 U/L   Alkaline Phosphatase 263 (H) 38 - 126 U/L   Total Bilirubin 0.5 0.3 - 1.2 mg/dL   GFR, Estimated >60 >60 mL/min    Comment: (NOTE) Calculated using the CKD-EPI Creatinine Equation (2021)    Anion gap 12 5 - 15    Comment: Performed at [facility], [address], [place], [place] [address]  Troponin I (High Sensitivity)     Status: None   Collection Time: 10/31/22  1:17 PM  Result Value Ref Range   Troponin I (High Sensitivity) 4 <18 ng/L    Comment: (NOTE) Elevated high sensitivity troponin I (hsTnI) values and significant  changes across serial measurements may suggest ACS but many other  chronic and acute conditions are known to elevate hsTnI results.  Refer to the "Links" section for chest pain algorithms and additional  guidance. Performed at [facility], [address], [place], [place] [address]   CBC with  Differential     Status: Abnormal   Collection Time: 10/31/22  1:17 PM  Result Value Ref Range   WBC 29.7 (H) 4.0 - 10.5 K/uL   RBC 4.48 3.87 - 5.11 MIL/uL   Hemoglobin 11.4 (L) 12.0 - 15.0 g/dL   HCT 37.3 36.0 - 46.0 %   MCV 83.3 80.0 - 100.0 fL   MCH 25.4 (L) 26.0 - 34.0 pg   MCHC 30.6 30.0 - 36.0 g/dL   RDW 15.3 11.5 - 15.5 %   Platelets 461 (H) 150 - 400 K/uL   nRBC 0.0 0.0 - 0.2 %   Neutrophils Relative % 93 %   Neutro Abs 27.7 (H) 1.7 - 7.7 K/uL   Lymphocytes Relative 2 %   Lymphs Abs 0.6 (L) 0.7 - 4.0 K/uL   Monocytes Relative 4 %   Monocytes Absolute 1.2 (H) 0.1 - 1.0 K/uL   Eosinophils Relative 0 %   Eosinophils Absolute 0.0 0.0 - 0.5 K/uL   Basophils Relative 0 %   Basophils Absolute 0.1 0.0 - 0.1 K/uL   Immature Granulocytes 1 %   Abs Immature Granulocytes 0.19 (H) 0.00 - 0.07 K/uL    Comment: Performed at [facility], [address] [place], [place] [address]  Lactic acid, plasma     Status: Abnormal   Collection Time: 10/31/22  1:17 PM  Result Value Ref Range   Lactic Acid, Venous 2.0 (HH) 0.5 - 1.9 mmol/L    Comment: CRITICAL RESULT CALLED TO, READ BACK BY AND VERIFIED WITH: [name] DOSS @ 1344 ON 10/31/22 C VARNER Performed at [facility], [address] [place], [place] [address]   Blood culture (routine x 2)     Status: None (Preliminary result)   Collection Time: 10/31/22  2:57 PM   Specimen: BLOOD LEFT HAND  Result Value Ref Range   Specimen Description BLOOD LEFT HAND    Special Requests      BOTTLES DRAWN AEROBIC AND ANAEROBIC Blood Culture adequate volume Performed at [facility], [address] [place], [place] [address]    Culture PENDING    Report Status PENDING   Blood culture (routine x 2)     Status: None (Preliminary result)   Collection Time: 10/31/22  2:57 PM   Specimen: BLOOD LEFT HAND  Result Value Ref Range   Specimen Description BLOOD RIGHT HAND    Special Requests      BOTTLES DRAWN AEROBIC ONLY Blood Culture results may not be  optimal due to an inadequate volume of blood  received in culture bottles Performed at [facility], [address], [place], [place] [address]    Culture PENDING    Report Status PENDING     CT ABDOMEN PELVIS W CONTRAST  Result Date: 10/31/2022 CLINICAL DATA:  Sepsis. EXAM: CT ABDOMEN AND PELVIS WITH CONTRAST TECHNIQUE: Multidetector CT imaging of the abdomen and pelvis was performed using the standard protocol following bolus administration of intravenous contrast. RADIATION DOSE REDUCTION: This exam was performed according to the departmental dose-optimization program which includes automated exposure control, adjustment of the mA and/or kV according to patient size and/or use of iterative reconstruction technique. CONTRAST:  41mL OMNIPAQUE IOHEXOL 300 MG/ML  SOLN COMPARISON:  Noncontrast CT 09/29/2021 FINDINGS: Lower chest: Dependent atelectasis with trace pleural thickening in the lung bases. Hepatobiliary: Focal fatty infiltration adjacent to the falciform ligament. No evidence of discrete intrahepatic lesion. There is marked intra and extrahepatic biliary ductal dilatation. The common bile duct measures 17 mm. The gallbladder is moderately distended. No calcified gallstone. There is a 4 mm rounded noncalcified density in the distal common bile duct series 2, image 35 suspicious for choledocholithiasis. Pancreas: Pancreatic atrophy. Pancreatic ductal dilatation of 5 mm. There is no obvious obstructing pancreatic mass. Question small pancreatic cyst or side branch lesion series 2, image 30. Spleen: The spleen is enlarged spanning 15 cm cranial caudal. No focal abnormalities. Adrenals/Urinary Tract: Normal adrenal glands. No hydronephrosis or perinephric edema. Homogeneous renal enhancement with symmetric excretion on delayed phase imaging. Small cyst in the upper left kidney. No further follow-up imaging is needed. No visualized renal calculi or solid renal lesion. Urinary bladder is physiologically  distended without wall thickening. Stomach/Bowel: There are foci of free air in the upper abdomen most consistent with perforated viscus, for example series 2, image 22. There is moderate wall thickening about the gastric cardia which is ill-defined. Moderate wall thickening involves the distal gastric body is well. Occasional fluid-filled loops of small bowel in the right abdomen, but no evidence of small-bowel obstruction. There is no small bowel pneumatosis or discrete small bowel inflammation to suggest small bowel as site of perforation. The appendix is tentatively visualized and normal series 6, image 33. Formed stool in the ascending and splenic flexure of the colon. The distal descending and sigmoid colon are nondistended. Equivocal wall thickening versus nondistention. Few sigmoid colonic diverticula, but no focal diverticulitis as cause for perforation. Vascular/Lymphatic: Aortic atherosclerosis without aneurysm. Patent portal, splenic and superior mesenteric veins. No portal venous or mesenteric gas. Limited assessment for adenopathy on the current exam, no obvious enlarged lymph nodes. Reproductive: Retroverted uterus.  No adnexal mass. Other: Small foci of free air in the anterior upper abdomen, series 2, image 21. Small fat containing supraumbilical ventral abdominal wall hernia. There is no evidence of discrete abdominopelvic collection. Musculoskeletal: Posterior L5-S1 fusion hardware. Grade 1 anterolisthesis of L4 on L5 with prominent facet hypertrophy. There are no acute or suspicious osseous abnormalities. IMPRESSION: 1. Foci of free air in the upper abdomen most consistent with perforated viscus. Exact source is difficult to delineate, however there is wall thickening about both the gastric cardia as well as distal stomach suggesting gastric or upper GI source of perforation. 2. Marked intra and extrahepatic biliary ductal dilatation. There is a 4 mm rounded noncalcified density in the distal  common bile duct suspicious for choledocholithiasis. However there is also pancreatic ductal dilatation and atrophy. Possibility of a CT occult obstructing pancreatic lesion is considered. Recommend further assessment with MRI after resolution of acute  event. 3. Equivocal wall thickening versus nondistention of the distal descending and sigmoid colon. Few sigmoid colonic diverticula, but no focal diverticulitis as cause for perforation. 4. Splenomegaly. 5. Small fat containing supraumbilical ventral abdominal wall hernia. Aortic Atherosclerosis (ICD10-I70.0). Critical Value/emergent results were called by telephone at the time of interpretation on 10/31/2022 at 4:07 pm to provider Edilia Bo , who verbally acknowledged these results. Electronically Signed   By: Narda Rutherford M.D.   On: 10/31/2022 16:08    ROS:  Pertinent items are noted in HPI.  Blood pressure (!) 74/56, pulse (!) 54, temperature (!) 97.4 F (36.3 C), temperature source Oral, resp. rate 15, SpO2 100 %. Physical Exam: Cachectic white female in no acute distress Head is normocephalic, atraumatic Lungs clear to auscultation with equal breath sounds bilaterally Heart examination reveals a regular bradycardic rhythm.  No S3 or S4 noted. Abdomen is flat with diffuse tenderness, but no boardlike rigidity.  No bowel sounds appreciated. CT scan images personally reviewed Assessment/Plan: 1: Abdominal pain, question microperforation of peptic ulcer.  As there is no significant pneumoperitoneum or free fluid, she may have had a microperforation of the stomach which has since healed. 2: Sepsis secondary to #1. 3: Hepatobiliary dilatation secondary to possible choledocholithiasis.  She does have transaminitis but does not have an elevated bilirubin.  Plan: Patient will be admitted to the ICU for fluid resuscitation, IV antibiotics, and close monitoring.  As that there is the possibility that this microperforation has already sealed, I want to do  more resuscitation prior to any acute surgical invention.  Once this acute problem has been handled, we will then address her hepatobiliary tree.  Patient is in critical but guarded condition.  We will ask the hospitalist to follow along with me. Franky Macho 10/31/2022, 5:48 PM

## 2022-10-31 NOTE — ED Triage Notes (Signed)
Pt brought in by [facility] for c/o sob; pt told [facility]-comm she was "gasping for air" before ems arrived;   when ems arrived they found pt to not have power   Pt was found sitting on couch covered in urine and feces  Pt was alert and oriented when they arrived and in no acute respiratory distress  Pt c/o left side abdominal pain, pt reported to ems she has not eat in the last 24 hours  EMS reported pt to APS due to pt sitting on couch in feces and urine and unable to get up; pt told ems her daughter has not been to check on her in 9 days  Pt has a hx of ETOH abuse

## 2022-10-31 NOTE — Progress Notes (Addendum)
Pharmacy Antibiotic Note  Jamie [name] is a 66 y.o. female admitted on 10/31/2022 with concern of intra-abdominal infection.  Pharmacy has been consulted for cefepime dosing. Cefepime 2g given in the ED. Patient with noted allergy of itching to Augmentin but received CTX in the past and cefepime earlier today (no reaction per RN from previous RN handoff).   Plan: Start cefepime 2g q12h  Monitor renal function, cultures, and clinical progression Continue metronidazole per MD     Temp (24hrs), Avg:97.6 F (36.4 C), Min:97.4 F (36.3 C), Max:97.8 F (36.6 C)  Recent Labs  Lab 10/31/22 1317  WBC 29.7*  CREATININE 0.98  LATICACIDVEN 2.0*    CrCl cannot be calculated (Unknown ideal weight.).    Allergies  Allergen Reactions   Almond Oil Itching   Amoxicillin-Pot Clavulanate Nausea And Vomiting and Other (See Comments)    Can tolerate plain Amoxicillin (??)   Azithromycin Nausea And Vomiting   Bupropion Nausea And Vomiting   Nsaids Other (See Comments)    Stomach problems   Prednisone Diarrhea   Vilazodone Diarrhea and Other (See Comments)    Viibryd    Antimicrobials this admission: Cefepime 11/6 >>  Metronidazole 11/6 >>   Dose adjustments this admission:   Microbiology results: 11/6 bcx:   Thank you for allowing pharmacy to be a part of this patient's care.  Cristela Felt, PharmD, BCPS Clinical Pharmacist 10/31/2022 5:49 PM

## 2022-10-31 NOTE — ED Notes (Signed)
Report called to Jessica, RN

## 2022-10-31 NOTE — ED Provider Notes (Signed)
.  Central Line  Date/Time: 10/31/2022 9:33 PM  Performed by: Godfrey Pick, MD Authorized by: Godfrey Pick, MD   Consent:    Consent obtained:  Verbal   Consent given by:  Patient   Risks, benefits, and alternatives were discussed: yes     Risks discussed:  Bleeding, infection, incorrect placement and arterial puncture   Alternatives discussed:  No treatment and delayed treatment Universal protocol:    Procedure explained and questions answered to patient or proxy's satisfaction: yes     Patient identity confirmed:  Verbally with patient Pre-procedure details:    Indication(s): central venous access and insufficient peripheral access     Hand hygiene: Hand hygiene performed prior to insertion     Sterile barrier technique: All elements of maximal sterile technique followed     Skin preparation:  Chlorhexidine   Skin preparation agent: Skin preparation agent completely dried prior to procedure   Sedation:    Sedation type:  None Anesthesia:    Anesthesia method:  Local infiltration   Local anesthetic:  Lidocaine 1% w/o epi Procedure details:    Location:  L internal jugular   Patient position:  Supine   Procedural supplies:  Triple lumen   Catheter size:  7 Fr   Landmarks identified: yes     Ultrasound guidance: yes     Ultrasound guidance timing: real time     Sterile ultrasound techniques: Sterile gel and sterile probe covers were used     Number of attempts:  1   Successful placement: yes   Post-procedure details:    Post-procedure:  Dressing applied and line sutured   Assessment:  Blood return through all ports and free fluid flow   Procedure completion:  Tolerated well, no immediate complications     Godfrey Pick, MD 10/31/22 2134

## 2022-10-31 NOTE — ED Provider Notes (Signed)
Care of patient assumed from Dr. Tinnie Gens at 3:15 PM.  She was found at home too weak to get up off the couch.  Initial vital signs and lab work are consistent with sepsis.  Cefepime and IV fluids have been ordered.  Intra-abdominal source is suspected. Physical Exam  BP (!) 100/46   Pulse (!) 54   Temp 97.8 F (36.6 C) (Oral)   Resp 16   SpO2 99%   Physical Exam Vitals and nursing note reviewed.  Constitutional:      General: She is not in acute distress.    Appearance: She is normal weight. She is ill-appearing. She is not toxic-appearing or diaphoretic.  HENT:     Head: Normocephalic and atraumatic.     Mouth/Throat:     Mouth: Mucous membranes are moist.  Eyes:     Conjunctiva/sclera: Conjunctivae normal.  Cardiovascular:     Rate and Rhythm: Regular rhythm. Bradycardia present.  Pulmonary:     Effort: Pulmonary effort is normal. No tachypnea, accessory muscle usage or respiratory distress.     Breath sounds: Normal breath sounds.  Abdominal:     Palpations: Abdomen is soft.     Tenderness: There is abdominal tenderness.  Musculoskeletal:        General: No swelling.     Cervical back: Normal range of motion and neck supple.  Skin:    General: Skin is warm and dry.     Coloration: Skin is pale. Skin is not cyanotic.  Neurological:     General: No focal deficit present.     Mental Status: She is alert and oriented to person, place, and time.  Psychiatric:        Mood and Affect: Mood normal.        Behavior: Behavior normal.     Procedures  Procedures  ED Course / MDM    Medical Decision Making Amount and/or Complexity of Data Reviewed Labs: ordered. Radiology: ordered.  Risk Prescription drug management. Decision regarding hospitalization.   Flagyl was added to antibiotics for suspected intra-abdominal source.  CT of abdomen pelvis revealed a trace amount of free air.  She has areas of gastric wall thickening and perforated viscus is suspected.  There is  also some evidence of biliary duct dilatation.  I discussed with general surgeon on-call, Dr. Arnoldo Morale, who evaluated the patient in the ED.  He also reviewed imaging and feels that patient would benefit from ICU admission, continue antibiotics, and overnight observation.  Patient continues blood pressure despite IV fluids.  Epinephrine gtt. was ordered.  Patient was admitted to ICU for further management.       Godfrey Pick, MD 10/31/22 [phone]

## 2022-10-31 NOTE — Consult Note (Signed)
[place] Consult    Patient Demographics:    Jamie [name], is a 66 y.o. female  MRN: [id number]  DOB - [birth year]  Admit Date - 10/31/2022  Referring MD/NP/PA: Dr Arnoldo Morale  Outpatient Primary MD for the patient is Christain Sacramento, MD  Chief complaint-abdominal pain   HPI:    Jamie [name]  is a 66 y.o. female, with history of bradycardia, chronic pain syndrome, rheumatoid arthritis for which she takes [facility] powder almost every day for past 3 years, anxiety/depression, moderate malnutrition came to hospital with abdominal pain which started on Sunday.  Patient said the pain was in the left upper quadrant with radiation to right upper quadrant.  She denies nausea or vomiting.  Denies black stools.  Denies hematemesis.  No bloody bowel movement. In the ED CT abdomen/pelvis showed foci of free air in the upper abdomen most consistent with perforated viscus.  Wall thickening about both the gastric cardia as well as distal stomach suggesting gastric or upper GI source of perforation.  Also showed marked intra and extrahepatic biliary ductal dilatation.  4 mm rounded noncalcified density in the distal common bile duct suspicious for choledocholithiasis.  General surgery, Dr. Arnoldo Morale has admitted the patient. [facility] consulted for medical management.  Patient denies chest pain; complains of shortness of breath Denies nausea vomiting or diarrhea Patient was started on LR at 125 mill per hour along with Levophed gtt.    Review of systems:    In addition to the HPI above,    All other systems reviewed and are negative.    Past History of the following :    Past Medical History:  Diagnosis Date   Anxiety    Arthritis    Chronic pain    Gastroenteritis    Headache    Hypotension    Insomnia    Suicidal ideation       Past Surgical History:  Procedure Laterality Date   BACK SURGERY     Lumbar Fusion   CARPAL TUNNEL RELEASE      LAPAROSCOPIC OOPHERECTOMY     MOUTH SURGERY        Social History:      Social History   Tobacco Use   Smoking status: Never   Smokeless tobacco: Never  Substance Use Topics   Alcohol use: Not Currently       Family History :     Family History  Problem Relation Age of Onset   Colon cancer Neg Hx       Home Medications:   Prior to Admission medications   Medication Sig Start Date End Date Taking? Authorizing Provider  acetaminophen (TYLENOL) 500 MG tablet Take 2 tablets (1,000 mg total) by mouth every 8 (eight) hours as needed for mild pain or headache (or Fever >/= 101). Patient not taking: Reported on 09/29/2022 07/30/22   Barton Dubois, MD  furosemide (LASIX) 40 MG tablet Take 1 tablet (40 mg total) by mouth daily for 7 days. Follow repeat labs with your PCP within a week and discuss whether you should continue this medication. 10/05/22  10/12/22  Elodia Florence., MD  levETIRAcetam (KEPPRA) 500 MG tablet Take 1 tablet (500 mg total) by mouth 2 (two) times daily. 10/04/22 12/03/22  Elodia Florence., MD  loratadine (CLARITIN) 10 MG tablet Take 1 tablet (10 mg total) by mouth daily. Patient taking differently: Take 10 mg by mouth daily as needed for allergies, rhinitis or itching. 07/31/22   Barton Dubois, MD  Nystatin (GERHARDT'S BUTT CREAM) CREA Apply 1 Application topically 2 (two) times daily. To rash on bottom 10/04/22 11/03/22  Elodia Florence., MD  pantoprazole (PROTONIX) 40 MG tablet Take 1 tablet (40 mg total) by mouth daily. Patient taking differently: Take 40 mg by mouth daily as needed (for reflux). 07/30/22   Barton Dubois, MD  tiZANidine (ZANAFLEX) 4 MG tablet Take 4 mg by mouth 3 (three) times daily.    [provider]  traZODone (DESYREL) 100 MG tablet Take 1 tablet (100 mg total) by mouth at bedtime as needed for sleep. 07/30/22   Barton Dubois, MD  potassium chloride (MICRO-K) 10 MEQ CR capsule Take 10 mEq by mouth 2 (two) times  daily. Patient not taking: Reported on 07/27/2022 07/17/22 07/30/22  [provider]     Allergies:     Allergies  Allergen Reactions   Almond Oil Itching   Amoxicillin-Pot Clavulanate Nausea And Vomiting and Other (See Comments)    Can tolerate plain Amoxicillin (??)   Azithromycin Nausea And Vomiting   Bupropion Nausea And Vomiting   Nsaids Other (See Comments)    Stomach problems   Prednisone Diarrhea   Vilazodone Diarrhea and Other (See Comments)    Viibryd     Physical Exam:   Vitals  Blood pressure (!) 95/51, pulse 63, temperature 98.2 F (36.8 C), temperature source Oral, resp. rate 14, height _0  (1.676 m), weight 57.5 kg, SpO2 96 %.  1.  General: Appears in no acute distress  2. Psychiatric: Alert, oriented x3, intact insight and judgment  3. Neurologic: Cranial nerves II through XII grossly intact, no focal deficit noted  4. HEENMT:  Atraumatic normocephalic, extraocular muscles are intact  5. Respiratory : Clear to auscultation bilaterally  6. Cardiovascular : S1-S2, regular, no murmur auscultated  7. Gastrointestinal:  Abdomen soft, tenderness noted in the left upper quadrant, right upper quadrant and epigastric region.  No rigidity or guarding elicited  8. Skin:  No rashes noted      Data Review:    CBC Recent Labs  Lab 10/31/22 1317  WBC 29.7*  HGB 11.4*  HCT 37.3  PLT 461*  MCV 83.3  MCH 25.4*  MCHC 30.6  RDW 15.3  LYMPHSABS 0.6*  MONOABS 1.2*  EOSABS 0.0  BASOSABS 0.1   ------------------------------------------------------------------------------------------------------------------  Results for orders placed or performed during the hospital encounter of 10/31/22 (from the past 48 hour(s))  Comprehensive metabolic panel     Status: Abnormal   Collection Time: 10/31/22  1:17 PM  Result Value Ref Range   Sodium 136 135 - 145 mmol/L   Potassium 3.5 3.5 - 5.1 mmol/L   Chloride 110 98 - 111 mmol/L   CO2 14 (L) 22 - 32  mmol/L   Glucose, Bld 97 70 - 99 mg/dL    Comment: Glucose reference range applies only to samples taken after fasting for at least 8 hours.   BUN 23 8 - 23 mg/dL   Creatinine, Ser 0.98 0.44 - 1.00 mg/dL   Calcium 8.7 (L) 8.9 - 10.3 mg/dL  Total Protein 7.1 6.5 - 8.1 g/dL   Albumin 3.7 3.5 - 5.0 g/dL   AST 187 (H) 15 - 41 U/L   ALT 86 (H) 0 - 44 U/L   Alkaline Phosphatase 263 (H) 38 - 126 U/L   Total Bilirubin 0.5 0.3 - 1.2 mg/dL   GFR, Estimated >60 >60 mL/min    Comment: (NOTE) Calculated using the CKD-EPI Creatinine Equation (2021)    Anion gap 12 5 - 15    Comment: Performed at [facility], [address] [place], [place] [address]  Troponin I (High Sensitivity)     Status: None   Collection Time: 10/31/22  1:17 PM  Result Value Ref Range   Troponin I (High Sensitivity) 4 <18 ng/L    Comment: (NOTE) Elevated high sensitivity troponin I (hsTnI) values and significant  changes across serial measurements may suggest ACS but many other  chronic and acute conditions are known to elevate hsTnI results.  Refer to the "Links" section for chest pain algorithms and additional  guidance. Performed at [facility], [address] [place], [place] [address]   CBC with Differential     Status: Abnormal   Collection Time: 10/31/22  1:17 PM  Result Value Ref Range   WBC 29.7 (H) 4.0 - 10.5 K/uL   RBC 4.48 3.87 - 5.11 MIL/uL   Hemoglobin 11.4 (L) 12.0 - 15.0 g/dL   HCT 37.3 36.0 - 46.0 %   MCV 83.3 80.0 - 100.0 fL   MCH 25.4 (L) 26.0 - 34.0 pg   MCHC 30.6 30.0 - 36.0 g/dL   RDW 15.3 11.5 - 15.5 %   Platelets 461 (H) 150 - 400 K/uL   nRBC 0.0 0.0 - 0.2 %   Neutrophils Relative % 93 %   Neutro Abs 27.7 (H) 1.7 - 7.7 K/uL   Lymphocytes Relative 2 %   Lymphs Abs 0.6 (L) 0.7 - 4.0 K/uL   Monocytes Relative 4 %   Monocytes Absolute 1.2 (H) 0.1 - 1.0 K/uL   Eosinophils Relative 0 %   Eosinophils Absolute 0.0 0.0 - 0.5 K/uL   Basophils Relative 0 %   Basophils Absolute 0.1 0.0 - 0.1  K/uL   Immature Granulocytes 1 %   Abs Immature Granulocytes 0.19 (H) 0.00 - 0.07 K/uL    Comment: Performed at [facility], [address] [place], [place] [address]  Lactic acid, plasma     Status: Abnormal   Collection Time: 10/31/22  1:17 PM  Result Value Ref Range   Lactic Acid, Venous 2.0 (HH) 0.5 - 1.9 mmol/L    Comment: CRITICAL RESULT CALLED TO, READ BACK BY AND VERIFIED WITH: [name] DOSS @ 1344 ON 10/31/22 C VARNER Performed at [facility], [address] [place], [place] [address]   Blood culture (routine x 2)     Status: None (Preliminary result)   Collection Time: 10/31/22  2:57 PM   Specimen: BLOOD LEFT HAND  Result Value Ref Range   Specimen Description BLOOD LEFT HAND    Special Requests      BOTTLES DRAWN AEROBIC AND ANAEROBIC Blood Culture adequate volume Performed at [facility], [address], [place],  [address]    Culture PENDING    Report Status PENDING   Blood culture (routine x 2)     Status: None (Preliminary result)   Collection Time: 10/31/22  2:57 PM   Specimen: BLOOD LEFT HAND  Result Value Ref Range   Specimen Description BLOOD RIGHT HAND  Special Requests      BOTTLES DRAWN AEROBIC ONLY Blood Culture results may not be optimal due to an inadequate volume of blood received in culture bottles Performed at [facility], [address] [place], [place] [address]    Culture PENDING    Report Status PENDING   Lactic acid, plasma     Status: Abnormal   Collection Time: 10/31/22  5:13 PM  Result Value Ref Range   Lactic Acid, Venous 2.1 (HH) 0.5 - 1.9 mmol/L    Comment: CRITICAL VALUE NOTED.  VALUE IS CONSISTENT WITH PREVIOUSLY REPORTED AND CALLED VALUE. Performed at [facility], [address] [place], [place] [address]   Troponin I (High Sensitivity)     Status: None   Collection Time: 10/31/22  5:13 PM  Result Value Ref Range   Troponin I (High Sensitivity) 4 <18 ng/L    Comment: (NOTE) Elevated high sensitivity troponin I (hsTnI)  values and significant  changes across serial measurements may suggest ACS but many other  chronic and acute conditions are known to elevate hsTnI results.  Refer to the "Links" section for chest pain algorithms and additional  guidance. Performed at [facility], [address], [place], [place] [address]   Urinalysis, Routine w reflex microscopic Urine, Clean Catch     Status: Abnormal   Collection Time: 10/31/22  8:51 PM  Result Value Ref Range   Color, Urine YELLOW YELLOW   APPearance CLEAR CLEAR   Specific Gravity, Urine 1.044 (H) 1.005 - 1.030   pH 5.0 5.0 - 8.0   Glucose, UA NEGATIVE NEGATIVE mg/dL   Hgb urine dipstick NEGATIVE NEGATIVE   Bilirubin Urine NEGATIVE NEGATIVE   Ketones, ur NEGATIVE NEGATIVE mg/dL   Protein, ur NEGATIVE NEGATIVE mg/dL   Nitrite NEGATIVE NEGATIVE   Leukocytes,Ua NEGATIVE NEGATIVE    Comment: Performed at [facility], [address] [place], [place] [address]  Rapid urine drug screen (hospital performed)     Status: Abnormal   Collection Time: 10/31/22  8:51 PM  Result Value Ref Range   Opiates NONE DETECTED NONE DETECTED   Cocaine NONE DETECTED NONE DETECTED   Benzodiazepines POSITIVE (A) NONE DETECTED   Amphetamines NONE DETECTED NONE DETECTED   Tetrahydrocannabinol NONE DETECTED NONE DETECTED   Barbiturates POSITIVE (A) NONE DETECTED    Comment: (NOTE) DRUG SCREEN FOR MEDICAL PURPOSES ONLY.  IF CONFIRMATION IS NEEDED FOR ANY PURPOSE, NOTIFY LAB WITHIN 5 DAYS.  LOWEST DETECTABLE LIMITS FOR URINE DRUG SCREEN Drug Class                     Cutoff (ng/mL) Amphetamine and metabolites    1000 Barbiturate and metabolites    200 Benzodiazepine                 200 Opiates and metabolites        300 Cocaine and metabolites        300 THC                            50 Performed at [facility], [address] [place], [place] [address]     Chemistries  Recent Labs  Lab 10/31/22 1317  NA 136  K 3.5  CL 110  CO2 14*  GLUCOSE 97  BUN  23  CREATININE 0.98  CALCIUM 8.7*  AST 187*  ALT 86*  ALKPHOS 263*  BILITOT 0.5   ------------------------------------------------------------------------------------------------------------------  ------------------------------------------------------------------------------------------------------------------ GFR: Estimated  Creatinine Clearance: 49.9 mL/min (by C-G formula based on SCr of 0.98 mg/dL). Liver Function Tests: Recent Labs  Lab 10/31/22 1317  AST 187*  ALT 86*  ALKPHOS 263*  BILITOT 0.5  PROT 7.1  ALBUMIN 3.7   No results for input(s): "LIPASE", "AMYLASE" in the last 168 hours. No results for input(s): "AMMONIA" in the last 168 hours. Coagulation Profile: No results for input(s): "INR", "PROTIME" in the last 168 hours. Cardiac Enzymes: No results for input(s): "CKTOTAL", "CKMB", "CKMBINDEX", "TROPONINI" in the last 168 hours. BNP (last 3 results) No results for input(s): "PROBNP" in the last 8760 hours. HbA1C: No results for input(s): "HGBA1C" in the last 72 hours. CBG: No results for input(s): "GLUCAP" in the last 168 hours. Lipid Profile: No results for input(s): "CHOL", "HDL", "LDLCALC", "TRIG", "CHOLHDL", "LDLDIRECT" in the last 72 hours. Thyroid Function Tests: No results for input(s): "TSH", "T4TOTAL", "FREET4", "T3FREE", "THYROIDAB" in the last 72 hours. Anemia Panel: No results for input(s): "VITAMINB12", "FOLATE", "FERRITIN", "TIBC", "IRON", "RETICCTPCT" in the last 72 hours.  --------------------------------------------------------------------------------------------------------------- Urine analysis:    Component Value Date/Time   COLORURINE YELLOW 10/31/2022 2051   APPEARANCEUR CLEAR 10/31/2022 2051   LABSPEC 1.044 (H) 10/31/2022 2051   PHURINE 5.0 10/31/2022 2051   GLUCOSEU NEGATIVE 10/31/2022 2051   HGBUR NEGATIVE 10/31/2022 2051   [place] NEGATIVE 10/31/2022 2051   [place] NEGATIVE 10/31/2022 2051   PROTEINUR NEGATIVE  10/31/2022 2051   NITRITE NEGATIVE 10/31/2022 2051   LEUKOCYTESUR NEGATIVE 10/31/2022 2051      Imaging Results:    DG Chest Portable 1 View  Result Date: 10/31/2022 CLINICAL DATA:  Central line placement EXAM: PORTABLE CHEST 1 VIEW COMPARISON:  Radiographs 10/03/2022 FINDINGS: New left IJ CVC tip in the low SVC. Remainder unchanged from 10/03/2022. Scarring/atelectasis left lung base. Elevated left hemidiaphragm. Stable cardiomediastinal silhouette. Aortic atherosclerotic calcification. No acute osseous abnormality. No pleural effusion or pneumothorax. IMPRESSION: Left IJ CVC tip in the low SVC. Electronically Signed   By: Placido Sou M.D.   On: 10/31/2022 21:54   [place] EKG SITE RITE  Result Date: 10/31/2022 If Site Rite image not attached, placement could not be confirmed due to current cardiac rhythm.  CT ABDOMEN PELVIS W CONTRAST  Result Date: 10/31/2022 CLINICAL DATA:  Sepsis. EXAM: CT ABDOMEN AND PELVIS WITH CONTRAST TECHNIQUE: Multidetector CT imaging of the abdomen and pelvis was performed using the standard protocol following bolus administration of intravenous contrast. RADIATION DOSE REDUCTION: This exam was performed according to the departmental dose-optimization program which includes automated exposure control, adjustment of the mA and/or kV according to patient size and/or use of iterative reconstruction technique. CONTRAST:  60m OMNIPAQUE IOHEXOL 300 MG/ML  SOLN COMPARISON:  Noncontrast CT 09/29/2021 FINDINGS: Lower chest: Dependent atelectasis with trace pleural thickening in the lung bases. Hepatobiliary: Focal fatty infiltration adjacent to the falciform ligament. No evidence of discrete intrahepatic lesion. There is marked intra and extrahepatic biliary ductal dilatation. The common bile duct measures 17 mm. The gallbladder is moderately distended. No calcified gallstone. There is a 4 mm rounded noncalcified density in the distal common bile duct series 2, image 35 suspicious  for choledocholithiasis. Pancreas: Pancreatic atrophy. Pancreatic ductal dilatation of 5 mm. There is no obvious obstructing pancreatic mass. Question small pancreatic cyst or side branch lesion series 2, image 30. Spleen: The spleen is enlarged spanning 15 cm cranial caudal. No focal abnormalities. Adrenals/Urinary Tract: Normal adrenal glands. No hydronephrosis or perinephric edema. Homogeneous renal enhancement with symmetric excretion on delayed phase imaging.  Small cyst in the upper left kidney. No further follow-up imaging is needed. No visualized renal calculi or solid renal lesion. Urinary bladder is physiologically distended without wall thickening. Stomach/Bowel: There are foci of free air in the upper abdomen most consistent with perforated viscus, for example series 2, image 22. There is moderate wall thickening about the gastric cardia which is ill-defined. Moderate wall thickening involves the distal gastric body is well. Occasional fluid-filled loops of small bowel in the right abdomen, but no evidence of small-bowel obstruction. There is no small bowel pneumatosis or discrete small bowel inflammation to suggest small bowel as site of perforation. The appendix is tentatively visualized and normal series 6, image 33. Formed stool in the ascending and splenic flexure of the colon. The distal descending and sigmoid colon are nondistended. Equivocal wall thickening versus nondistention. Few sigmoid colonic diverticula, but no focal diverticulitis as cause for perforation. Vascular/Lymphatic: Aortic atherosclerosis without aneurysm. Patent portal, splenic and superior mesenteric veins. No portal venous or mesenteric gas. Limited assessment for adenopathy on the current exam, no obvious enlarged lymph nodes. Reproductive: Retroverted uterus.  No adnexal mass. Other: Small foci of free air in the anterior upper abdomen, series 2, image 21. Small fat containing supraumbilical ventral abdominal wall hernia.  There is no evidence of discrete abdominopelvic collection. Musculoskeletal: Posterior L5-S1 fusion hardware. Grade 1 anterolisthesis of L4 on L5 with prominent facet hypertrophy. There are no acute or suspicious osseous abnormalities. IMPRESSION: 1. Foci of free air in the upper abdomen most consistent with perforated viscus. Exact source is difficult to delineate, however there is wall thickening about both the gastric cardia as well as distal stomach suggesting gastric or upper GI source of perforation. 2. Marked intra and extrahepatic biliary ductal dilatation. There is a 4 mm rounded noncalcified density in the distal common bile duct suspicious for choledocholithiasis. However there is also pancreatic ductal dilatation and atrophy. Possibility of a CT occult obstructing pancreatic lesion is considered. Recommend further assessment with MRI after resolution of acute event. 3. Equivocal wall thickening versus nondistention of the distal descending and sigmoid colon. Few sigmoid colonic diverticula, but no focal diverticulitis as cause for perforation. 4. Splenomegaly. 5. Small fat containing supraumbilical ventral abdominal wall hernia. Aortic Atherosclerosis (ICD10-I70.0). Critical Value/emergent results were called by telephone at the time of interpretation on 10/31/2022 at 4:07 pm to provider Scot Dock , who verbally acknowledged these results. Electronically Signed   By: Keith Rake M.D.   On: 10/31/2022 16:08    My personal review of EKG: Sinus bradycardia   Assessment & Plan:    Principal Problem:   Peritonitis ([place])   Peritonitis/perforated viscus-General surgery following.  Patient started on cefepime, Flagyl.  Continue LR at 125 mill per hour.  Levophed gtt ordered. Sinus bradycardia-EKG shows sinus bradycardia with heart rate in 50s.  Patient has a history of sinus bradycardia.  We will continue to monitor closely in ICU. ?  Peptic ulcer disease-patient has been taking BC powder almost  daily for past 3 years.  Has been started on IV Protonix. Metabolic acidosis-bicarb is 14, likely from perforated viscus/hypotension.  Started on pressor support and IV fluids as above.  Lactic acid is 2.1.  Follow BMP in am. Choledocholithiasis-seen on CT abdomen/pelvis, patient also has AST and ALT elevated along with alk phos 263.  Will need further evaluation with GI once stable from surgical standpoint.        Time spent in minutes : 60 min   [place]  M.D

## 2022-10-31 NOTE — ED Provider Notes (Signed)
[facility] EMERGENCY DEPARTMENT Provider Note  CSN: [id number] Arrival date & time: 10/31/22 1217  Chief Complaint(s) Shortness of Breath  HPI Jamie [name] is a 66 y.o. female with PMH anxiety, gastroenteritis, alcohol abuse who presents emergency department for evaluation of abdominal pain, shortness of breath and generalized weakness.  Patient called EMS due to concern for significant shortness of breath and gasping for air.  Patient found by EMS covered in stool and urine with no power on in her home.  On arrival here in the emergency department, she is denying shortness of breath but arrives hypotensive, bradycardic with significant abdominal pain.  Denies nausea, chest pain, headache, fever or other systemic symptoms.   Past Medical History Past Medical History:  Diagnosis Date   Anxiety    Arthritis    Chronic pain    Gastroenteritis    Headache    Hypotension    Insomnia    Suicidal ideation    Patient Active Problem List   Diagnosis Date Noted   Symptomatic bradycardia 09/29/2022   Malnutrition of moderate degree 09/29/2022   Hypotension    Physical deconditioning 07/29/2022   Shock circulatory ([place]) 07/29/2022   Chronic pain 07/28/2022   UTI (urinary tract infection) 07/28/2022   Chronic diarrhea 07/28/2022   Lactic acidosis 07/28/2022   Sinus bradycardia 07/27/2022   Seizure ([place]) 07/18/2022   Generalized weakness 05/04/2022   Hypoglycemia 05/04/2022   Hypokalemia 04/06/2022   Falls frequently 04/06/2022   Alcohol abuse 04/06/2022   Opiate dependence --Chronic pain 04/06/2022   Benzodiazepine dependence /Chronic anxiety 04/06/2022   Abdominal pain    Nausea vomiting and diarrhea 05/08/2016   Sepsis, unspecified organism ([place]) 05/08/2016   Migraine 05/08/2016   AKI (acute kidney injury) ([place]) 05/08/2016   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 Authorizing Provider  acetaminophen (TYLENOL) 500 MG tablet Take 2 tablets (1,000 mg total) by mouth every 8 (eight) hours as needed for mild pain or headache (or Fever >/= 101). Patient not taking: Reported on 09/29/2022 07/30/22   Barton Dubois, MD  furosemide (LASIX) 40 MG tablet Take 1 tablet (40 mg total) by mouth daily for 7 days. Follow repeat labs with your PCP within a week and discuss whether you should continue this medication. 10/05/22 10/12/22  Elodia Florence., MD  levETIRAcetam (KEPPRA) 500 MG tablet Take 1 tablet (500 mg total) by mouth 2 (two) times daily. 10/04/22 12/03/22  Elodia Florence., MD  loratadine (CLARITIN) 10 MG tablet Take 1 tablet (10 mg total) by mouth daily. Patient taking differently: Take 10 mg by mouth daily as needed for allergies, rhinitis or itching. 07/31/22   Barton Dubois, MD  Nystatin (GERHARDT'S BUTT CREAM) CREA Apply 1 Application topically 2 (two) times daily. To rash on bottom 10/04/22 11/03/22  Elodia Florence., MD  pantoprazole (PROTONIX) 40 MG tablet Take 1 tablet (40 mg total) by mouth daily. Patient taking differently: Take 40 mg by mouth daily as needed (for reflux). 07/30/22   Barton Dubois, MD  tiZANidine (ZANAFLEX) 4 MG tablet Take 4 mg by mouth 3 (three) times daily.    [provider]  traZODone (DESYREL) 100 MG tablet Take 1 tablet (100 mg total) by mouth at bedtime as needed for sleep. 07/30/22   Barton Dubois, MD  potassium chloride (MICRO-K) 10 MEQ CR capsule Take 10 mEq  by mouth 2 (two) times daily. Patient not taking: Reported on 07/27/2022 07/17/22 07/30/22  [provider]                                                                                                                                    Past Surgical History Past Surgical History:  Procedure Laterality Date   BACK SURGERY     Lumbar Fusion   CARPAL TUNNEL RELEASE     LAPAROSCOPIC OOPHERECTOMY     MOUTH SURGERY      Family History Family History  Problem Relation Age of Onset   Colon cancer Neg Hx     Social History Social History   Tobacco Use   Smoking status: Never   Smokeless tobacco: Never  Vaping Use   Vaping Use: Never used  Substance Use Topics   Alcohol use: Not Currently   Drug use: Never   Allergies Almond oil, Amoxicillin-pot clavulanate, Azithromycin, Bupropion, Nsaids, Prednisone, and Vilazodone  Review of Systems Review of Systems  Gastrointestinal:  Positive for abdominal pain.    Physical Exam Vital Signs  I have reviewed the triage vital signs BP (!) 87/61 (BP Location: Left Arm)   Pulse (!) 56   Resp 18   SpO2 100%   Physical Exam Vitals and nursing note reviewed.  Constitutional:      General: She is not in acute distress.    Appearance: She is well-developed. She is ill-appearing.  HENT:     Head: Normocephalic and atraumatic.  Eyes:     Conjunctiva/sclera: Conjunctivae normal.  Cardiovascular:     Rate and Rhythm: Normal rate and regular rhythm.     Heart sounds: No murmur heard. Pulmonary:     Effort: Pulmonary effort is normal. No respiratory distress.     Breath sounds: Normal breath sounds.  Abdominal:     Palpations: Abdomen is soft.     Tenderness: There is abdominal tenderness.  Musculoskeletal:        General: No swelling.     Cervical back: Neck supple.  Skin:    General: Skin is warm and dry.     Capillary Refill: Capillary refill takes less than 2 seconds.  Neurological:     Mental Status: She is alert.  Psychiatric:        Mood and Affect: Mood normal.     ED Results and Treatments Labs (all labs ordered are listed, but only abnormal results are displayed) Labs Reviewed  CBC WITH DIFFERENTIAL/PLATELET - Abnormal; Notable for the following components:      Result Value   WBC 29.7 (*)    Hemoglobin 11.4 (*)    MCH 25.4 (*)    Platelets 461 (*)    All other components within normal limits  COMPREHENSIVE METABOLIC PANEL   URINALYSIS, ROUTINE W REFLEX MICROSCOPIC  RAPID URINE DRUG SCREEN, HOSP PERFORMED  LACTIC ACID, PLASMA  LACTIC ACID, PLASMA  TROPONIN  I (HIGH SENSITIVITY)                                                                                                                          Radiology No results found.  Pertinent labs & imaging results that were available during my care of the patient were reviewed by me and considered in my medical decision making (see MDM for details).  Medications Ordered in ED Medications  lactated ringers bolus 1,000 mL (has no administration in time range)                                                                                                                                     Procedures .Critical Care  Performed by: Teressa Lower, MD Authorized by: Teressa Lower, MD   Critical care provider statement:    Critical care time (minutes):  30   Critical care was necessary to treat or prevent imminent or life-threatening deterioration of the following conditions:  Sepsis   Critical care was time spent personally by me on the following activities:  Development of treatment plan with patient or surrogate, discussions with consultants, evaluation of patient's response to treatment, examination of patient, ordering and review of laboratory studies, ordering and review of radiographic studies, ordering and performing treatments and interventions, pulse oximetry, re-evaluation of patient's condition and review of old charts   (including critical care time)  Medical Decision Making / ED Course   This patient presents to the ED for concern of abdominal pain, this involves an extensive number of treatment options, and is a complaint that carries with it a high risk of complications and morbidity.  The differential diagnosis includes sepsis, intra-abdominal infection, perforated viscus, nephrolithiasis, gastroenteritis  MDM: Patient seen in the emergency  room for evaluation of abdominal pain.  Physical exam reveals an ill-appearing patient with significant tenderness to palpation in all quadrants of the abdomen.  Initial laboratory evaluation with an AST of 187, ALT 86, alk phos 286, significant leukocytosis to 29.7, hemoglobin 11.4 with a lactic acid of 2.0.  Sepsis alert initiated and patient started on cefepime for suspected abdominal source.  Hypotension improving with fluid resuscitation.  At time of signout, patient is pending CT imaging.  Please see provider signout for continuation of work-up.  Anticipate admission.   Additional history obtained:  -External records from outside source obtained and reviewed including: Chart review including previous  notes, labs, imaging, consultation notes   Lab Tests: -I ordered, reviewed, and interpreted labs.   The pertinent results include:   Labs Reviewed  CBC WITH DIFFERENTIAL/PLATELET - Abnormal; Notable for the following components:      Result Value   WBC 29.7 (*)    Hemoglobin 11.4 (*)    MCH 25.4 (*)    Platelets 461 (*)    All other components within normal limits  COMPREHENSIVE METABOLIC PANEL  URINALYSIS, ROUTINE W REFLEX MICROSCOPIC  RAPID URINE DRUG SCREEN, HOSP PERFORMED  LACTIC ACID, PLASMA  LACTIC ACID, PLASMA  TROPONIN I (HIGH SENSITIVITY)      EKG   EKG Interpretation  Date/Time:    Ventricular Rate:    PR Interval:    QRS Duration:   QT Interval:    QTC Calculation:   R Axis:     Text Interpretation:           Imaging Studies ordered: I ordered imaging studies including CT AP, results pending   Medicines ordered and prescription drug management: Meds ordered this encounter  Medications   lactated ringers bolus 1,000 mL    -I have reviewed the patients home medicines and have made adjustments as needed  Critical interventions Antibiotics, fluid resuscitation 2   Cardiac Monitoring: The patient was maintained on a cardiac monitor.  I personally  viewed and interpreted the cardiac monitored which showed an underlying rhythm of: Sinus bradycardia  Social Determinants of Health:  Factors impacting patients care include: Patient currently still drinking alcohol and found in home with no power   Reevaluation: After the interventions noted above, I reevaluated the patient and found that they have :improved  Co morbidities that complicate the patient evaluation  Past Medical History:  Diagnosis Date   Anxiety    Arthritis    Chronic pain    Gastroenteritis    Headache    Hypotension    Insomnia    Suicidal ideation       Dispostion: I considered admission for this patient, and disposition pending completion of CT imaging.  Please see provider signout for continuation of work-up.  Anticipate admission     Final Clinical Impression(s) / ED Diagnoses Final diagnoses:  None     _0 @    Teressa Lower, MD 10/31/22 [id number]

## 2022-10-31 NOTE — Progress Notes (Signed)
[facility] Progress Note Patient Name: Jamie [name] DOB: 10/19/1956 MRN: [id number]   Date of Service  10/31/2022  HPI/Events of Note  Patient admitted with perforated gastric ulcer, acute abdomen secondary to perforation, and possible choledocholithiasis / cholecystitis.  eICU Interventions  New Patient Evaluation.        Kerry Kass Marley Pakula 10/31/2022, 11:08 PM

## 2022-11-01 ENCOUNTER — Inpatient Hospital Stay (HOSPITAL_COMMUNITY): Payer: Medicare HMO

## 2022-11-01 DIAGNOSIS — A419 Sepsis, unspecified organism: Secondary | ICD-10-CM | POA: Diagnosis not present

## 2022-11-01 DIAGNOSIS — R001 Bradycardia, unspecified: Secondary | ICD-10-CM

## 2022-11-01 DIAGNOSIS — R569 Unspecified convulsions: Secondary | ICD-10-CM | POA: Diagnosis not present

## 2022-11-01 DIAGNOSIS — K659 Peritonitis, unspecified: Secondary | ICD-10-CM | POA: Diagnosis not present

## 2022-11-01 LAB — COMPREHENSIVE METABOLIC PANEL
ALT: 52 U/L — ABNORMAL HIGH (ref 0–44)
AST: 53 U/L — ABNORMAL HIGH (ref 15–41)
Albumin: 3.2 g/dL — ABNORMAL LOW (ref 3.5–5.0)
Alkaline Phosphatase: 203 U/L — ABNORMAL HIGH (ref 38–126)
Anion gap: 4 — ABNORMAL LOW (ref 5–15)
BUN: 16 mg/dL (ref 8–23)
CO2: 18 mmol/L — ABNORMAL LOW (ref 22–32)
Calcium: 8.3 mg/dL — ABNORMAL LOW (ref 8.9–10.3)
Chloride: 113 mmol/L — ABNORMAL HIGH (ref 98–111)
Creatinine, Ser: 0.88 mg/dL (ref 0.44–1.00)
GFR, Estimated: >60 mL/min (ref >60)
Glucose, Bld: 90 mg/dL (ref 70–99)
Potassium: 3.8 mmol/L (ref 3.5–5.1)
Sodium: 135 mmol/L (ref 135–145)
Total Bilirubin: 0.5 mg/dL (ref 0.3–1.2)
Total Protein: 6.3 g/dL — ABNORMAL LOW (ref 6.5–8.1)

## 2022-11-01 LAB — CBC
HCT: 31.2 % — ABNORMAL LOW (ref 36.0–46.0)
Hemoglobin: 9.7 g/dL — ABNORMAL LOW (ref 12.0–15.0)
MCH: 25.7 pg — ABNORMAL LOW (ref 26.0–34.0)
MCHC: 31.1 g/dL (ref 30.0–36.0)
MCV: 82.5 fL (ref 80.0–100.0)
Platelets: 453 10*3/uL — ABNORMAL HIGH (ref 150–400)
RBC: 3.78 MIL/uL — ABNORMAL LOW (ref 3.87–5.11)
RDW: 15.5 % (ref 11.5–15.5)
WBC: 13.9 10*3/uL — ABNORMAL HIGH (ref 4.0–10.5)
nRBC: 0 % (ref 0.0–0.2)

## 2022-11-01 LAB — MRSA NEXT GEN BY PCR, NASAL: MRSA by PCR Next Gen: NOT DETECTED

## 2022-11-01 LAB — MAGNESIUM: Magnesium: 2.1 mg/dL (ref 1.7–2.4)

## 2022-11-01 LAB — PHOSPHORUS: Phosphorus: 3 mg/dL (ref 2.5–4.6)

## 2022-11-01 MED ORDER — LEVETIRACETAM IN NACL 500 MG/100ML IV SOLN
500.0000 mg | Freq: Two times a day (BID) | INTRAVENOUS | Status: DC
  Administered 2022-11-01 – 2022-11-06 (×11): 500 mg via INTRAVENOUS
  Filled 2022-11-01 (×12): qty 100

## 2022-11-01 MED ORDER — GADOBUTROL 1 MMOL/ML IV SOLN
7.0000 mL | Freq: Once | INTRAVENOUS | Status: AC | PRN
  Administered 2022-11-01: 7 mL via INTRAVENOUS

## 2022-11-01 MED ORDER — HYDRALAZINE HCL 20 MG/ML IJ SOLN
10.0000 mg | Freq: Four times a day (QID) | INTRAMUSCULAR | Status: DC | PRN
  Administered 2022-11-01: 10 mg via INTRAVENOUS
  Filled 2022-11-01: qty 1

## 2022-11-01 MED ORDER — ALPRAZOLAM 0.25 MG PO TABS
1.0000 mg | ORAL_TABLET | Freq: Three times a day (TID) | ORAL | Status: DC | PRN
  Administered 2022-11-01 – 2022-11-06 (×14): 1 mg via ORAL
  Filled 2022-11-01 (×5): qty 4
  Filled 2022-11-01: qty 2
  Filled 2022-11-01 (×3): qty 4
  Filled 2022-11-01 (×5): qty 2
  Filled 2022-11-01: qty 4

## 2022-11-01 MED ORDER — PANTOPRAZOLE SODIUM 40 MG IV SOLR
40.0000 mg | Freq: Two times a day (BID) | INTRAVENOUS | Status: DC
  Administered 2022-11-01 – 2022-11-06 (×11): 40 mg via INTRAVENOUS
  Filled 2022-11-01 (×11): qty 10

## 2022-11-01 MED ORDER — PROSOURCE PLUS PO LIQD
30.0000 mL | Freq: Three times a day (TID) | ORAL | Status: DC
  Administered 2022-11-03 – 2022-11-04 (×3): 30 mL via ORAL
  Filled 2022-11-01 (×11): qty 30

## 2022-11-01 MED ORDER — AMLODIPINE BESYLATE 5 MG PO TABS
5.0000 mg | ORAL_TABLET | Freq: Every day | ORAL | Status: DC
  Administered 2022-11-01 – 2022-11-15 (×15): 5 mg via ORAL
  Filled 2022-11-01 (×16): qty 1

## 2022-11-01 NOTE — Progress Notes (Addendum)
MRCP shows multiple distal common bile duct stones with a common bile duct that measures 12 mm.  Patient also has biliary sludge and some pericholecystic fluid present.  There is no mention of the speckles of free air seen on the CT scan.  I have discussed the case with Vicie Mutters, PA of [facility] at [facility].  The will see the patient in consult at [facility].  Dr. Carles Collet will transfer the patient to the Hospitalist Service at [facility].  I will send a note to [place] about the patient. To make them aware.  [name], Daughter, aware.

## 2022-11-01 NOTE — Progress Notes (Signed)
Initial Nutrition Assessment  DOCUMENTATION CODES:      INTERVENTION:  ProSource Plus 30 ml TID (each 30 ml provides 100 kcal, 15 gr protein)   Follow diet advancement   NUTRITION DIAGNOSIS:   Inadequate oral intake related to acute illness as evidenced by  (clear liquid diet).   GOAL:  Patient will meet greater than or equal to 90% of their needs  MONITOR:  Diet advancement, Labs, Weight trends, PO intake  REASON FOR ASSESSMENT:   Malnutrition Screening Tool    ASSESSMENT: patient hospitalized last month due to sepsis/pneumonia. She is a 66 yo female with chronic pain syndrome who presented with abdominal pain. Patient septic secondary to peritonitis (perforated stomach). Surgery consulted and no immediate intervention planned.  Diet advanced. Patient receiving clear liqiuds. No family bedside. Patient feeling hungry. Explained what liquid diet consists of. Offered her a [employer] which is appropriate for  clear liquid diet but patient refused and cites poor tolerance of juice type beverages.   Medications: protonix   Patient weight history reviewed- Since April '23 she has fluctuated between 51- 63.5 kg.  Lactated Ringers @ 100 ml/hr     Latest Ref Rng & Units 11/01/2022    3:36 AM 10/31/2022    1:17 PM 10/04/2022    1:06 AM  BMP  Glucose 70 - 99 mg/dL 90  97  113   BUN 8 - 23 mg/dL 16  23  18    Creatinine 0.44 - 1.00 mg/dL 0.88  0.98  0.95   Sodium 135 - 145 mmol/L 135  136  134   Potassium 3.5 - 5.1 mmol/L 3.8  3.5  3.9   Chloride 98 - 111 mmol/L 113  110  98   CO2 22 - 32 mmol/L 18  14  25    Calcium 8.9 - 10.3 mg/dL 8.3  8.7  8.8       NUTRITION - FOCUSED PHYSICAL EXAM:  Flowsheet Row Most Recent Value  Orbital Region Mild depletion  Upper Arm Region No depletion  Thoracic and Lumbar Region No depletion  Buccal Region No depletion  Temple Region Mild depletion  Clavicle and Acromion Bone Region Mild depletion  Dorsal Hand No depletion  Patellar  Region Mild depletion  Edema (RD Assessment) None  Hair Reviewed  Eyes Reviewed  Mouth Reviewed  Skin Reviewed  Nails Reviewed       Diet Order:   Diet Order             Diet clear liquid Room service appropriate? Yes; Fluid consistency: Thin  Diet effective now                   EDUCATION NEEDS:  Education needs have been addressed  Skin:  Skin Assessment: Skin Integrity Issues: Skin Integrity Issues:: Stage II Stage II: sacrum  Last BM:  unknown  Height:   Ht Readings from Last 1 Encounters:  10/31/22 5\' 6"  (1.676 m)    Weight:   Wt Readings from Last 1 Encounters:  10/31/22 57.5 kg    Ideal Body Weight:   59 kg  BMI:  Body mass index is 20.46 kg/m.  Estimated Nutritional Needs:   Kcal:  1800-1900  Protein:  80-85 gr  Fluid:  >1600 ml daily   Colman Cater MS,RD,CSG,LDN Contact: Shea Evans

## 2022-11-01 NOTE — Progress Notes (Signed)
Subjective: Patient complains of left-sided abdominal pain.  When I enter the room, she was sitting up and lying down in the bed in no apparent pain.  Objective: Vital signs in last 24 hours: Temp:  [97.4 F (36.3 C)-98.3 F (36.8 C)] 98.3 F (36.8 C) (11/07 0708) Pulse Rate:  [52-96] 92 (11/07 0800) Resp:  [13-28] 17 (11/07 0800) BP: (74-181)/(36-96) 181/75 (11/07 0800) SpO2:  [93 %-100 %] 93 % (11/07 0800) Weight:  [57.5 kg-59.9 kg] 57.5 kg (11/06 2200)    Intake/Output from previous day: 11/06 0701 - 11/07 0700 In: 2134 [I.V.:736.9; IV Piggyback:1397.2] Out: 200 [Urine:200] Intake/Output this shift: Total I/O In: 418.3 [I.V.:328.5; IV Piggyback:89.8] Out: -   General appearance: alert, cooperative, cachectic, and mild distress Resp: clear to auscultation bilaterally Cardio: regular rate and rhythm, S1, S2 normal, no murmur, click, rub or gallop GI: Soft with migratory tenderness throughout the abdomen.  No boardlike rigidity noted.  No peritoneal signs noted.  Lab Results:  Recent Labs    10/31/22 1317 11/01/22 0336  WBC 29.7* 13.9*  HGB 11.4* 9.7*  HCT 37.3 31.2*  PLT 461* 453*   BMET Recent Labs    10/31/22 1317 11/01/22 0336  NA 136 135  K 3.5 3.8  CL 110 113*  CO2 14* 18*  GLUCOSE 97 90  BUN 23 16  CREATININE 0.98 0.88  CALCIUM 8.7* 8.3*   PT/INR No results for input(s): "LABPROT", "INR" in the last 72 hours.  Studies/Results: DG Chest Portable 1 View  Result Date: 10/31/2022 CLINICAL DATA:  Central line placement EXAM: PORTABLE CHEST 1 VIEW COMPARISON:  Radiographs 10/03/2022 FINDINGS: New left IJ CVC tip in the low SVC. Remainder unchanged from 10/03/2022. Scarring/atelectasis left lung base. Elevated left hemidiaphragm. Stable cardiomediastinal silhouette. Aortic atherosclerotic calcification. No acute osseous abnormality. No pleural effusion or pneumothorax. IMPRESSION: Left IJ CVC tip in the low SVC. Electronically Signed   By: Placido Sou M.D.   On: 10/31/2022 21:54   [place] EKG SITE RITE  Result Date: 10/31/2022 If Site Rite image not attached, placement could not be confirmed due to current cardiac rhythm.  CT ABDOMEN PELVIS W CONTRAST  Result Date: 10/31/2022 CLINICAL DATA:  Sepsis. EXAM: CT ABDOMEN AND PELVIS WITH CONTRAST TECHNIQUE: Multidetector CT imaging of the abdomen and pelvis was performed using the standard protocol following bolus administration of intravenous contrast. RADIATION DOSE REDUCTION: This exam was performed according to the departmental dose-optimization program which includes automated exposure control, adjustment of the mA and/or kV according to patient size and/or use of iterative reconstruction technique. CONTRAST:  25mL OMNIPAQUE IOHEXOL 300 MG/ML  SOLN COMPARISON:  Noncontrast CT 09/29/2021 FINDINGS: Lower chest: Dependent atelectasis with trace pleural thickening in the lung bases. Hepatobiliary: Focal fatty infiltration adjacent to the falciform ligament. No evidence of discrete intrahepatic lesion. There is marked intra and extrahepatic biliary ductal dilatation. The common bile duct measures 17 mm. The gallbladder is moderately distended. No calcified gallstone. There is a 4 mm rounded noncalcified density in the distal common bile duct series 2, image 35 suspicious for choledocholithiasis. Pancreas: Pancreatic atrophy. Pancreatic ductal dilatation of 5 mm. There is no obvious obstructing pancreatic mass. Question small pancreatic cyst or side branch lesion series 2, image 30. Spleen: The spleen is enlarged spanning 15 cm cranial caudal. No focal abnormalities. Adrenals/Urinary Tract: Normal adrenal glands. No hydronephrosis or perinephric edema. Homogeneous renal enhancement with symmetric excretion on delayed phase imaging. Small cyst in the upper left kidney. No further follow-up imaging is  needed. No visualized renal calculi or solid renal lesion. Urinary bladder is physiologically distended  without wall thickening. Stomach/Bowel: There are foci of free air in the upper abdomen most consistent with perforated viscus, for example series 2, image 22. There is moderate wall thickening about the gastric cardia which is ill-defined. Moderate wall thickening involves the distal gastric body is well. Occasional fluid-filled loops of small bowel in the right abdomen, but no evidence of small-bowel obstruction. There is no small bowel pneumatosis or discrete small bowel inflammation to suggest small bowel as site of perforation. The appendix is tentatively visualized and normal series 6, image 33. Formed stool in the ascending and splenic flexure of the colon. The distal descending and sigmoid colon are nondistended. Equivocal wall thickening versus nondistention. Few sigmoid colonic diverticula, but no focal diverticulitis as cause for perforation. Vascular/Lymphatic: Aortic atherosclerosis without aneurysm. Patent portal, splenic and superior mesenteric veins. No portal venous or mesenteric gas. Limited assessment for adenopathy on the current exam, no obvious enlarged lymph nodes. Reproductive: Retroverted uterus.  No adnexal mass. Other: Small foci of free air in the anterior upper abdomen, series 2, image 21. Small fat containing supraumbilical ventral abdominal wall hernia. There is no evidence of discrete abdominopelvic collection. Musculoskeletal: Posterior L5-S1 fusion hardware. Grade 1 anterolisthesis of L4 on L5 with prominent facet hypertrophy. There are no acute or suspicious osseous abnormalities. IMPRESSION: 1. Foci of free air in the upper abdomen most consistent with perforated viscus. Exact source is difficult to delineate, however there is wall thickening about both the gastric cardia as well as distal stomach suggesting gastric or upper GI source of perforation. 2. Marked intra and extrahepatic biliary ductal dilatation. There is a 4 mm rounded noncalcified density in the distal common bile  duct suspicious for choledocholithiasis. However there is also pancreatic ductal dilatation and atrophy. Possibility of a CT occult obstructing pancreatic lesion is considered. Recommend further assessment with MRI after resolution of acute event. 3. Equivocal wall thickening versus nondistention of the distal descending and sigmoid colon. Few sigmoid colonic diverticula, but no focal diverticulitis as cause for perforation. 4. Splenomegaly. 5. Small fat containing supraumbilical ventral abdominal wall hernia. Aortic Atherosclerosis (ICD10-I70.0). Critical Value/emergent results were called by telephone at the time of interpretation on 10/31/2022 at 4:07 pm to provider Edilia Bo , who verbally acknowledged these results. Electronically Signed   By: Narda Rutherford M.D.   On: 10/31/2022 16:08    Anti-infectives: Anti-infectives (From admission, onward)    Start     Dose/Rate Route Frequency Ordered Stop   11/01/22 0600  metroNIDAZOLE (FLAGYL) IVPB 500 mg        500 mg 100 mL/hr over 60 Minutes Intravenous Every 12 hours 10/31/22 1747     10/31/22 2200  ceFEPIme (MAXIPIME) 2 g in sodium chloride 0.9 % 100 mL IVPB        2 g 200 mL/hr over 30 Minutes Intravenous Every 12 hours 10/31/22 1757     10/31/22 1600  metroNIDAZOLE (FLAGYL) IVPB 500 mg        500 mg 100 mL/hr over 60 Minutes Intravenous  Once 10/31/22 1546 10/31/22 1759   10/31/22 1445  ceFEPIme (MAXIPIME) 2 g in sodium chloride 0.9 % 100 mL IVPB        2 g 200 mL/hr over 30 Minutes Intravenous  Once 10/31/22 1440 10/31/22 1643       Assessment/Plan: Impression: Septic shock of unknown etiology.  Patient's abdominal examination has not progressed.  I doubt she  has an active perforated viscus.  She is off her Levophed drip.  Her blood pressure and heart rate have significantly improved.  Her leukocytosis is normalizing.  Her transaminitis is improving.  I think her hypotension was multifactorial in nature.  As she is improving, no need for  acute surgical invention at this time. Plan: We will start liquid diet.  May have p.o. medications.  We will get MRCP today to evaluate the hepatobiliary tree.  Further management is pending those results.  LOS: 1 day    Franky Macho 11/01/2022

## 2022-11-01 NOTE — Hospital Course (Signed)
66 year old female with a history of chronic pain syndrome, anxiety, alcohol use in remission, chronic back pain, seizure presenting with worsening upper abdominal pain that began on 10/29/2022.  Notably, the patient was recently discharged from the hospital after a stay from 09/28/2029 to 10/04/2022 where she was treated for sepsis secondary to pneumonia.  The patient required vasopressors for short period of time during the hospitalization.  Her hospital stay was complicated by a seizure for which she was started on Keppra.  The patient was also noted to have symptomatic bradycardia which was thought to be due to her metabolic abnormalities.  This resolved with treatment of her acute medical issues.  The patient was discharged home with home health PT. Apparently, the patient had progressive weakness since 10/29/2022 to the point where she was unable to get off the couch.  Upon EMS  activation, the patient was found to be covered in urine and feces.  The patient complains of her chronic pain complaints all over, primarily back pain.  She denies any fevers or chills, but endorsed nausea and vomiting.  There is no diarrhea, hematochezia, melena.  She complains of chronic shortness of breath. In the ED, the patient bradycardic and hypotensive.  She was started on vasopressors.  WBC 29.7, hemoglobin 1.4, platelets 461,000.  Sodium 135, potassium 3.8, bicarbonate 18, serum creatinine 0.88.  CT of the abdomen and pelvis showed foci of free air in the upper abdomen with moderate thickening of the gastric cardia and distal gastric body.  There was also marked intrahepatic and extrahepatic ductal dilatation with a 4 mm round density in the distal CBD.  There was also pancreatic atrophy and pancreatic duct dilatation.  The patient was started on cefepime and metronidazole and IV fluids.  We are being consulted for bradycardia and medical management.

## 2022-11-01 NOTE — TOC Initial Note (Signed)
Transition of Care [facility]) - Initial/Assessment Note    Patient Details  Name: Jamie [name] MRN: [id number] Date of Birth: 04-May-1956  Transition of Care [facility]) CM/SW Contact:    Elliot Gault, LCSW Phone Number: 11/01/2022, 12:40 PM  Clinical Narrative:                  Pt admitted from home. Pt well known to TOC from previous admissions. EMS made APS report based on condition of pt's home, personal care when they arrived at the home. This LCSW spoke with DSS APS worker, Merry Proud this AM to update.   Per MD, pt may have to transfer to [facility] for further workup. Anticipating pt will have recommendation for [facility] or SNF at dc. Will await further evaluation by PT/OT and also APS outcome.  TOC will follow.   Expected Discharge Plan: Home w Home Health Services Barriers to Discharge: Continued Medical Work up   Patient Goals and CMS Choice        Expected Discharge Plan and Services Expected Discharge Plan: Home w Home Health Services In-house Referral: Clinical Social Work     Living arrangements for the past 2 months: Single Family Home                                      Prior Living Arrangements/Services Living arrangements for the past 2 months: Single Family Home Lives with:: Self Patient language and need for interpreter reviewed:: Yes Do you feel safe going back to the place where you live?: Yes      Need for Family Participation in Patient Care: Yes (Comment) Care giver support system in place?: No (comment) Current home services: DME Criminal Activity/Legal Involvement Pertinent to Current Situation/Hospitalization: No - Comment as needed  Activities of Daily Living Home Assistive Devices/Equipment: Bedside commode/3-in-1, Cane (specify quad or straight), Walker (specify type) ADL Screening (condition at time of admission) Patient's cognitive ability adequate to safely complete daily activities?: No Is the patient deaf or have difficulty hearing?: Yes Does  the patient have difficulty seeing, even when wearing glasses/contacts?: No Does the patient have difficulty concentrating, remembering, or making decisions?: No Patient able to express need for assistance with ADLs?: Yes Does the patient have difficulty dressing or bathing?: Yes Independently performs ADLs?: No Communication: Independent Dressing (OT): Needs assistance Is this a change from baseline?: Change from baseline, expected to last >3 days Grooming: Needs assistance Is this a change from baseline?: Change from baseline, expected to last >3 days Feeding: Independent Bathing: Needs assistance Is this a change from baseline?: Change from baseline, expected to last >3 days In/Out Bed: Independent with device (comment) Walks in Home: Independent with device (comment) Does the patient have difficulty walking or climbing stairs?: Yes Weakness of Legs: Both Weakness of Arms/Hands: Both  Permission Sought/Granted                  Emotional Assessment       Orientation: : Oriented to Self Alcohol / Substance Use: Not Applicable Psych Involvement: No (comment)  Admission diagnosis:  Peritonitis (HCC) [K65.9] Intra-abdominal free air of unknown etiology [K66.8] Sepsis, due to unspecified organism, unspecified whether acute organ dysfunction present [facility]) [A41.9] Patient Active Problem List   Diagnosis Date Noted   Peritonitis (HCC) 10/31/2022   Symptomatic bradycardia 09/29/2022   Malnutrition of moderate degree 09/29/2022   Hypotension    Physical deconditioning 07/29/2022  Shock circulatory ([place]) 07/29/2022   Chronic pain 07/28/2022   UTI (urinary tract infection) 07/28/2022   Chronic diarrhea 07/28/2022   Lactic acidosis 07/28/2022   Sinus bradycardia 07/27/2022   Seizure ([place]) 07/18/2022   Generalized weakness 05/04/2022   Hypoglycemia 05/04/2022   Hypokalemia 04/06/2022   Falls frequently 04/06/2022   Alcohol abuse 04/06/2022   Opiate dependence --Chronic  pain 04/06/2022   Benzodiazepine dependence /Chronic anxiety 04/06/2022   Abdominal pain    Nausea vomiting and diarrhea 05/08/2016   Sepsis, unspecified organism ([place]) 05/08/2016   Migraine 05/08/2016   AKI (acute kidney injury) ([place]) 05/08/2016   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 [place] [address] Phone: [phone] Fax: [phone]  [facility] [address] [place] [address] Phone: [phone] Fax: [phone]     Social Determinants of Health (SDOH) Interventions    Readmission Risk Interventions    11/01/2022   12:39 PM 10/04/2022    3:42 PM 07/28/2022    1:55 PM  Readmission Risk Prevention Plan  Transportation Screening Complete Complete Complete  Medication Review Press photographer) Complete Complete Complete  PCP or Specialist appointment within 3-5 days of discharge   Not Complete  HRI or [place]  Complete Complete  SW Recovery Care/Counseling Consult Complete Complete Complete  Palliative Care Screening Not Applicable Not Applicable Not [place]  Not Applicable Not Applicable

## 2022-11-01 NOTE — H&P (Signed)
Reason for Consult: Acute abdominal pain Referring Physician: Dr. Durwin Nora, ER   Jamie [name] is an 66 y.o. female.  HPI: Patient is a 66 year old white female who presented to the emergency room today with a greater than 24-hour history of abdominal pain.  She states that she started having abdominal pain yesterday.  It was acute on onset.  Since that time, she has had persistent abdominal pain.  She states her pain is mostly on the left upper portion of the abdomen.  It is not worsened.  She denies any nausea or vomiting.  She does take occasional BC powders for headaches.  She denies any hematemesis, hematochezia.  She does not remember when her last bowel movement was.  A CT scan of the abdomen pelvis was performed which revealed multiple findings including a speck of free air high up above the liver with thickened cardia and antral stomach areas.  In addition, she has a dilated hepatobiliary tree with a possible 4 mm common bile duct stone distally.  No stones are noted within the gallbladder.  She also has a dilated pancreatic duct.  She was just discharged from [facility] in early October with hypotension due to bradycardia and relative diastolic heart failure.  Urine is negative for alcohol.  It is positive for amphetamines and barbiturates.       Past Medical History:  Diagnosis Date   Anxiety     Arthritis     Chronic pain     Gastroenteritis     Headache     Hypotension     Insomnia     Suicidal ideation             Past Surgical History:  Procedure Laterality Date   BACK SURGERY        Lumbar Fusion   CARPAL TUNNEL RELEASE       LAPAROSCOPIC OOPHERECTOMY       MOUTH SURGERY               Family History  Problem Relation Age of Onset   Colon cancer Neg Hx        Social History:  reports that she has never smoked. She has never used smokeless tobacco. She reports that she does not currently use alcohol. She reports that she does not use drugs.   Allergies:        Allergies  Allergen Reactions   Almond Oil Itching   Amoxicillin-Pot Clavulanate Nausea And Vomiting and Other (See Comments)      Can tolerate plain Amoxicillin (??)   Azithromycin Nausea And Vomiting   Bupropion Nausea And Vomiting   Nsaids Other (See Comments)      Stomach problems   Prednisone Diarrhea   Vilazodone Diarrhea and Other (See Comments)      Viibryd      Medications: Prior to Admission: (Not in a hospital admission)    Lab Results Last 48 Hours        Results for orders placed or performed during the hospital encounter of 10/31/22 (from the past 48 hour(s))  Comprehensive metabolic panel     Status: Abnormal    Collection Time: 10/31/22  1:17 PM  Result Value Ref Range    Sodium 136 135 - 145 mmol/L    Potassium 3.5 3.5 - 5.1 mmol/L    Chloride 110 98 - 111 mmol/L    CO2 14 (L) 22 - 32 mmol/L    Glucose, Bld 97 70 - 99 mg/dL  Comment: Glucose reference range applies only to samples taken after fasting for at least 8 hours.    BUN 23 8 - 23 mg/dL    Creatinine, Ser 7.56 0.44 - 1.00 mg/dL    Calcium 8.7 (L) 8.9 - 10.3 mg/dL    Total Protein 7.1 6.5 - 8.1 g/dL    Albumin 3.7 3.5 - 5.0 g/dL    AST 433 (H) 15 - 41 U/L    ALT 86 (H) 0 - 44 U/L    Alkaline Phosphatase 263 (H) 38 - 126 U/L    Total Bilirubin 0.5 0.3 - 1.2 mg/dL    GFR, Estimated >29 >51 mL/min      Comment: (NOTE) Calculated using the CKD-EPI Creatinine Equation (2021)      Anion gap 12 5 - 15      Comment: Performed at [facility], [address], [place], [place] [address]  Troponin I (High Sensitivity)     Status: None    Collection Time: 10/31/22  1:17 PM  Result Value Ref Range    Troponin I (High Sensitivity) 4 <18 ng/L      Comment: (NOTE) Elevated high sensitivity troponin I (hsTnI) values and significant  changes across serial measurements may suggest ACS but many other  chronic and acute conditions are known to elevate hsTnI results.  Refer to the "Links" section for chest  pain algorithms and additional  guidance. Performed at [facility], [address], [place], [place] [address]    CBC with Differential     Status: Abnormal    Collection Time: 10/31/22  1:17 PM  Result Value Ref Range    WBC 29.7 (H) 4.0 - 10.5 K/uL    RBC 4.48 3.87 - 5.11 MIL/uL    Hemoglobin 11.4 (L) 12.0 - 15.0 g/dL    HCT 16.0 10.9 - 32.3 %    MCV 83.3 80.0 - 100.0 fL    MCH 25.4 (L) 26.0 - 34.0 pg    MCHC 30.6 30.0 - 36.0 g/dL    RDW 55.7 32.2 - 02.5 %    Platelets 461 (H) 150 - 400 K/uL    nRBC 0.0 0.0 - 0.2 %    Neutrophils Relative % 93 %    Neutro Abs 27.7 (H) 1.7 - 7.7 K/uL    Lymphocytes Relative 2 %    Lymphs Abs 0.6 (L) 0.7 - 4.0 K/uL    Monocytes Relative 4 %    Monocytes Absolute 1.2 (H) 0.1 - 1.0 K/uL    Eosinophils Relative 0 %    Eosinophils Absolute 0.0 0.0 - 0.5 K/uL    Basophils Relative 0 %    Basophils Absolute 0.1 0.0 - 0.1 K/uL    Immature Granulocytes 1 %    Abs Immature Granulocytes 0.19 (H) 0.00 - 0.07 K/uL      Comment: Performed at [facility], [address], [place], [place] [address]  Lactic acid, plasma     Status: Abnormal    Collection Time: 10/31/22  1:17 PM  Result Value Ref Range    Lactic Acid, Venous 2.0 (HH) 0.5 - 1.9 mmol/L      Comment: CRITICAL RESULT CALLED TO, READ BACK BY AND VERIFIED WITH: [name] DOSS @ 1344 ON 10/31/22 Riesa Pope Performed at [facility], [address], [place], [place] [address]    Blood culture (routine x 2)     Status: None (Preliminary result)    Collection Time: 10/31/22  2:57 PM    Specimen: BLOOD  LEFT HAND  Result Value Ref Range    Specimen Description BLOOD LEFT HAND      Special Requests          BOTTLES DRAWN AEROBIC AND ANAEROBIC Blood Culture adequate volume Performed at [facility], [address], [place], [place] [address]      Culture PENDING      Report Status PENDING    Blood culture (routine x 2)     Status: None (Preliminary result)    Collection Time: 10/31/22  2:57 PM     Specimen: BLOOD LEFT HAND  Result Value Ref Range    Specimen Description BLOOD RIGHT HAND      Special Requests          BOTTLES DRAWN AEROBIC ONLY Blood Culture results may not be optimal due to an inadequate volume of blood received in culture bottles Performed at [facility], [address] [place], [place] [address]      Culture PENDING      Report Status PENDING           Imaging Results (Last 48 hours)  CT ABDOMEN PELVIS W CONTRAST   Result Date: 10/31/2022 CLINICAL DATA:  Sepsis. EXAM: CT ABDOMEN AND PELVIS WITH CONTRAST TECHNIQUE: Multidetector CT imaging of the abdomen and pelvis was performed using the standard protocol following bolus administration of intravenous contrast. RADIATION DOSE REDUCTION: This exam was performed according to the departmental dose-optimization program which includes automated exposure control, adjustment of the mA and/or kV according to patient size and/or use of iterative reconstruction technique. CONTRAST:  54mL OMNIPAQUE IOHEXOL 300 MG/ML  SOLN COMPARISON:  Noncontrast CT 09/29/2021 FINDINGS: Lower chest: Dependent atelectasis with trace pleural thickening in the lung bases. Hepatobiliary: Focal fatty infiltration adjacent to the falciform ligament. No evidence of discrete intrahepatic lesion. There is marked intra and extrahepatic biliary ductal dilatation. The common bile duct measures 17 mm. The gallbladder is moderately distended. No calcified gallstone. There is a 4 mm rounded noncalcified density in the distal common bile duct series 2, image 35 suspicious for choledocholithiasis. Pancreas: Pancreatic atrophy. Pancreatic ductal dilatation of 5 mm. There is no obvious obstructing pancreatic mass. Question small pancreatic cyst or side branch lesion series 2, image 30. Spleen: The spleen is enlarged spanning 15 cm cranial caudal. No focal abnormalities. Adrenals/Urinary Tract: Normal adrenal glands. No hydronephrosis or perinephric edema. Homogeneous  renal enhancement with symmetric excretion on delayed phase imaging. Small cyst in the upper left kidney. No further follow-up imaging is needed. No visualized renal calculi or solid renal lesion. Urinary bladder is physiologically distended without wall thickening. Stomach/Bowel: There are foci of free air in the upper abdomen most consistent with perforated viscus, for example series 2, image 22. There is moderate wall thickening about the gastric cardia which is ill-defined. Moderate wall thickening involves the distal gastric body is well. Occasional fluid-filled loops of small bowel in the right abdomen, but no evidence of small-bowel obstruction. There is no small bowel pneumatosis or discrete small bowel inflammation to suggest small bowel as site of perforation. The appendix is tentatively visualized and normal series 6, image 33. Formed stool in the ascending and splenic flexure of the colon. The distal descending and sigmoid colon are nondistended. Equivocal wall thickening versus nondistention. Few sigmoid colonic diverticula, but no focal diverticulitis as cause for perforation. Vascular/Lymphatic: Aortic atherosclerosis without aneurysm. Patent portal, splenic and superior mesenteric veins. No portal venous or mesenteric gas. Limited assessment for adenopathy on the current exam, no obvious  enlarged lymph nodes. Reproductive: Retroverted uterus.  No adnexal mass. Other: Small foci of free air in the anterior upper abdomen, series 2, image 21. Small fat containing supraumbilical ventral abdominal wall hernia. There is no evidence of discrete abdominopelvic collection. Musculoskeletal: Posterior L5-S1 fusion hardware. Grade 1 anterolisthesis of L4 on L5 with prominent facet hypertrophy. There are no acute or suspicious osseous abnormalities. IMPRESSION: 1. Foci of free air in the upper abdomen most consistent with perforated viscus. Exact source is difficult to delineate, however there is wall thickening  about both the gastric cardia as well as distal stomach suggesting gastric or upper GI source of perforation. 2. Marked intra and extrahepatic biliary ductal dilatation. There is a 4 mm rounded noncalcified density in the distal common bile duct suspicious for choledocholithiasis. However there is also pancreatic ductal dilatation and atrophy. Possibility of a CT occult obstructing pancreatic lesion is considered. Recommend further assessment with MRI after resolution of acute event. 3. Equivocal wall thickening versus nondistention of the distal descending and sigmoid colon. Few sigmoid colonic diverticula, but no focal diverticulitis as cause for perforation. 4. Splenomegaly. 5. Small fat containing supraumbilical ventral abdominal wall hernia. Aortic Atherosclerosis (ICD10-I70.0). Critical Value/emergent results were called by telephone at the time of interpretation on 10/31/2022 at 4:07 pm to provider Edilia Bo , who verbally acknowledged these results. Electronically Signed   By: Narda Rutherford M.D.   On: 10/31/2022 16:08       ROS:  Pertinent items are noted in HPI.   Blood pressure (!) 74/56, pulse (!) 54, temperature (!) 97.4 F (36.3 C), temperature source Oral, resp. rate 15, SpO2 100 %. Physical Exam: Cachectic white female in no acute distress Head is normocephalic, atraumatic Lungs clear to auscultation with equal breath sounds bilaterally Heart examination reveals a regular bradycardic rhythm.  No S3 or S4 noted. Abdomen is flat with diffuse tenderness, but no boardlike rigidity.  No bowel sounds appreciated. CT scan images personally reviewed Assessment/Plan: 1: Abdominal pain, question microperforation of peptic ulcer.  As there is no significant pneumoperitoneum or free fluid, she may have had a microperforation of the stomach which has since healed. 2: Sepsis secondary to #1. 3: Hepatobiliary dilatation secondary to possible choledocholithiasis.  She does have transaminitis but does  not have an elevated bilirubin.   Plan: Patient will be admitted to the ICU for fluid resuscitation, IV antibiotics, and close monitoring.  As that there is the possibility that this microperforation has already sealed, I want to do more resuscitation prior to any acute surgical invention.  Once this acute problem has been handled, we will then address her hepatobiliary tree.  Patient is in critical but guarded condition.  We will ask the hospitalist to follow along with me. Franky Macho 10/31/2022, 5:48 PM

## 2022-11-01 NOTE — Progress Notes (Signed)
PROGRESS NOTE Jamie [name] ZOX:[id number] DOB: [birth year] DOA: 10/31/2022 PCP: Barbie Banner, MD  Brief History:  66 year old female with a history of chronic pain syndrome, anxiety, alcohol use in remission, chronic back pain, seizure presenting with worsening upper abdominal pain that began on 10/29/2022.  Notably, the patient was recently discharged from the hospital after a stay from 09/28/2029 to 10/04/2022 where she was treated for sepsis secondary to pneumonia.  The patient required vasopressors for short period of time during the hospitalization.  Her hospital stay was complicated by a seizure for which she was started on Keppra.  The patient was also noted to have symptomatic bradycardia which was thought to be due to her metabolic abnormalities.  This resolved with treatment of her acute medical issues.  The patient was discharged home with home health PT. Apparently, the patient had progressive weakness since 10/29/2022 to the point where she was unable to get off the couch.  Upon EMS  activation, the patient was found to be covered in urine and feces.  The patient complains of her chronic pain complaints all over, primarily back pain.  She denies any fevers or chills, but endorsed nausea and vomiting.  There is no diarrhea, hematochezia, melena.  She complains of chronic shortness of breath. In the ED, the patient bradycardic and hypotensive.  She was started on vasopressors.  WBC 29.7, hemoglobin 1.4, platelets 461,000.  Sodium 135, potassium 3.8, bicarbonate 18, serum creatinine 0.88.  CT of the abdomen and pelvis showed foci of free air in the upper abdomen with moderate thickening of the gastric cardia and distal gastric body.  There was also marked intrahepatic and extrahepatic ductal dilatation with a 4 mm round density in the distal CBD.  There was also pancreatic atrophy and pancreatic duct dilatation.  The patient was started on cefepime and metronidazole and IV fluids.  We are  being consulted for bradycardia and medical management.   Assessment/Plan: Septic shock -Secondary to peritonitis and perforated viscus -Patient has been weaned off epinephrine -Continue IV fluids -Lactic acid peaked at 2.1 -Continue cefepime and metronidazole -UA negative for pyuria -Follow blood cultures  Peritonitis -Secondary to perforated stomach -Patient uses BC powders on a daily basis for several years -Remain n.p.o. -Continue cefepime metronidazole -Increase pantoprazole to twice daily  Bradycardia -Likely secondary to the patient's metabolic derangements and acute medical condition -Overall improved -Personally reviewed EKG--sinus rhythm, no AV blocks noted -09/29/2022 echo EF 64%, grade 2 DD, moderate elevated PASP, trivial MR -Remain on telemetry  Biliary ductal dilatation -Noted on CT abdomen -There is concern for possible choledocholithiasis -MRCP once stabilized   Seizure disorder -Patient had an unprovoked seizure during her last hospital admission -Continue Keppra IV  Opioid dependence -PDMP reviewed--patient receives oxycodone 10 mg, #120 on a monthly basis  Anxiety -PDMP reviewed -Patient received Xanax 1 mg, #90 on a monthly basis  Stage II sacral pressure ulcer -Present on admission -Local wound care    Family Communication:  no  Family at bedside  Consultants:  n/a  Code Status:  FULL   DVT Prophylaxis:   [place] Lovenox   Procedures: As Listed in Progress Note Above  Antibiotics: None      Subjective: Patient complains of pain all over.  She complains of some shortness of breath.  Denies any vomiting but has nausea.  She complains of upper abdominal pain.  She is passing flatus but no BM.  Objective: Vitals:  11/01/22 0330 11/01/22 0400 11/01/22 0430 11/01/22 0700  BP:  (!) 152/96  (!) 180/78  Pulse: 92 91 96 96  Resp: (!) 28 (!) 21 16 17   Temp:   97.6 F (36.4 C)   TempSrc:   Oral   SpO2: 96% 99% 97% 96%  Weight:       Height:        Intake/Output Summary (Last 24 hours) at 11/01/2022 0802 Last data filed at 11/01/2022 3662 Gross per 24 hour  Intake 2552.33 ml  Output 200 ml  Net 2352.33 ml   Weight change:  Exam:  General:  Pt is alert, follows commands appropriately, not in acute distress HEENT: No icterus, No thrush, No neck mass, [place]/AT Cardiovascular: RRR, S1/S2, no rubs, no gallops Respiratory: Bibasilar rales.  No wheezing Abdomen: Soft/+BS, generalized tender, non distended, no guarding Extremities: Nonpitting edema, No lymphangitis, No petechiae, No rashes, no synovitis   Data Reviewed: I have personally reviewed following labs and imaging studies Basic Metabolic Panel: Recent Labs  Lab 10/31/22 1317 11/01/22 0336  NA 136 135  K 3.5 3.8  CL 110 113*  CO2 14* 18*  GLUCOSE 97 90  BUN 23 16  CREATININE 0.98 0.88  CALCIUM 8.7* 8.3*  MG  --  2.1  PHOS  --  3.0   Liver Function Tests: Recent Labs  Lab 10/31/22 1317 11/01/22 0336  AST 187* 53*  ALT 86* 52*  ALKPHOS 263* 203*  BILITOT 0.5 0.5  PROT 7.1 6.3*  ALBUMIN 3.7 3.2*   No results for input(s): "LIPASE", "AMYLASE" in the last 168 hours. No results for input(s): "AMMONIA" in the last 168 hours. Coagulation Profile: No results for input(s): "INR", "PROTIME" in the last 168 hours. CBC: Recent Labs  Lab 10/31/22 1317 11/01/22 0336  WBC 29.7* 13.9*  NEUTROABS 27.7*  --   HGB 11.4* 9.7*  HCT 37.3 31.2*  MCV 83.3 82.5  PLT 461* 453*   Cardiac Enzymes: No results for input(s): "CKTOTAL", "CKMB", "CKMBINDEX", "TROPONINI" in the last 168 hours. BNP: Invalid input(s): "POCBNP" CBG: No results for input(s): "GLUCAP" in the last 168 hours. HbA1C: No results for input(s): "HGBA1C" in the last 72 hours. Urine analysis:    Component Value Date/Time   COLORURINE YELLOW 10/31/2022 2051   APPEARANCEUR CLEAR 10/31/2022 2051   LABSPEC 1.044 (H) 10/31/2022 2051   PHURINE 5.0 10/31/2022 2051   GLUCOSEU NEGATIVE  10/31/2022 2051   HGBUR NEGATIVE 10/31/2022 2051   Solen NEGATIVE 10/31/2022 2051   Mio NEGATIVE 10/31/2022 2051   PROTEINUR NEGATIVE 10/31/2022 2051   NITRITE NEGATIVE 10/31/2022 2051   LEUKOCYTESUR NEGATIVE 10/31/2022 2051   Sepsis Labs: @LABRCNTIP (procalcitonin:4,lacticidven:4) ) Recent Results (from the past 240 hour(s))  Blood culture (routine x 2)     Status: None (Preliminary result)   Collection Time: 10/31/22  2:57 PM   Specimen: BLOOD LEFT HAND  Result Value Ref Range Status   Specimen Description BLOOD LEFT HAND  Final   Special Requests   Final    BOTTLES DRAWN AEROBIC AND ANAEROBIC Blood Culture adequate volume Performed at [facility], [address], [place], [place] [address]    Culture PENDING  Incomplete   Report Status PENDING  Incomplete  Blood culture (routine x 2)     Status: None (Preliminary result)   Collection Time: 10/31/22  2:57 PM   Specimen: BLOOD LEFT HAND  Result Value Ref Range Status   Specimen Description BLOOD RIGHT HAND  Final   Special Requests  Final    BOTTLES DRAWN AEROBIC ONLY Blood Culture results may not be optimal due to an inadequate volume of blood received in culture bottles Performed at [facility], [address], [place], [place] [address]    Culture PENDING  Incomplete   Report Status PENDING  Incomplete     Scheduled Meds:  Chlorhexidine Gluconate Cloth  6 each Topical Daily   enoxaparin (LOVENOX) injection  40 mg Subcutaneous Q24H   influenza vaccine adjuvanted  0.5 mL Intramuscular Tomorrow-1000   pantoprazole (PROTONIX) IV  40 mg Intravenous QHS   pneumococcal 20-valent conjugate vaccine  0.5 mL Intramuscular Tomorrow-1000   Continuous Infusions:  sodium chloride     ceFEPime (MAXIPIME) IV Stopped (10/31/22 2251)   lactated ringers 125 mL/hr at 11/01/22 0733   metronidazole Stopped (11/01/22 0543)   norepinephrine (LEVOPHED) Adult infusion Stopped (10/31/22 1947)    Procedures/Studies: DG Chest  Portable 1 View  Result Date: 10/31/2022 CLINICAL DATA:  Central line placement EXAM: PORTABLE CHEST 1 VIEW COMPARISON:  Radiographs 10/03/2022 FINDINGS: New left IJ CVC tip in the low SVC. Remainder unchanged from 10/03/2022. Scarring/atelectasis left lung base. Elevated left hemidiaphragm. Stable cardiomediastinal silhouette. Aortic atherosclerotic calcification. No acute osseous abnormality. No pleural effusion or pneumothorax. IMPRESSION: Left IJ CVC tip in the low SVC. Electronically Signed   By: Minerva Fester M.D.   On: 10/31/2022 21:54   [place] EKG SITE RITE  Result Date: 10/31/2022 If Site Rite image not attached, placement could not be confirmed due to current cardiac rhythm.  CT ABDOMEN PELVIS W CONTRAST  Result Date: 10/31/2022 CLINICAL DATA:  Sepsis. EXAM: CT ABDOMEN AND PELVIS WITH CONTRAST TECHNIQUE: Multidetector CT imaging of the abdomen and pelvis was performed using the standard protocol following bolus administration of intravenous contrast. RADIATION DOSE REDUCTION: This exam was performed according to the departmental dose-optimization program which includes automated exposure control, adjustment of the mA and/or kV according to patient size and/or use of iterative reconstruction technique. CONTRAST:  53mL OMNIPAQUE IOHEXOL 300 MG/ML  SOLN COMPARISON:  Noncontrast CT 09/29/2021 FINDINGS: Lower chest: Dependent atelectasis with trace pleural thickening in the lung bases. Hepatobiliary: Focal fatty infiltration adjacent to the falciform ligament. No evidence of discrete intrahepatic lesion. There is marked intra and extrahepatic biliary ductal dilatation. The common bile duct measures 17 mm. The gallbladder is moderately distended. No calcified gallstone. There is a 4 mm rounded noncalcified density in the distal common bile duct series 2, image 35 suspicious for choledocholithiasis. Pancreas: Pancreatic atrophy. Pancreatic ductal dilatation of 5 mm. There is no obvious obstructing  pancreatic mass. Question small pancreatic cyst or side branch lesion series 2, image 30. Spleen: The spleen is enlarged spanning 15 cm cranial caudal. No focal abnormalities. Adrenals/Urinary Tract: Normal adrenal glands. No hydronephrosis or perinephric edema. Homogeneous renal enhancement with symmetric excretion on delayed phase imaging. Small cyst in the upper left kidney. No further follow-up imaging is needed. No visualized renal calculi or solid renal lesion. Urinary bladder is physiologically distended without wall thickening. Stomach/Bowel: There are foci of free air in the upper abdomen most consistent with perforated viscus, for example series 2, image 22. There is moderate wall thickening about the gastric cardia which is ill-defined. Moderate wall thickening involves the distal gastric body is well. Occasional fluid-filled loops of small bowel in the right abdomen, but no evidence of small-bowel obstruction. There is no small bowel pneumatosis or discrete small bowel inflammation to suggest small bowel as site of perforation. The appendix is tentatively visualized  and normal series 6, image 33. Formed stool in the ascending and splenic flexure of the colon. The distal descending and sigmoid colon are nondistended. Equivocal wall thickening versus nondistention. Few sigmoid colonic diverticula, but no focal diverticulitis as cause for perforation. Vascular/Lymphatic: Aortic atherosclerosis without aneurysm. Patent portal, splenic and superior mesenteric veins. No portal venous or mesenteric gas. Limited assessment for adenopathy on the current exam, no obvious enlarged lymph nodes. Reproductive: Retroverted uterus.  No adnexal mass. Other: Small foci of free air in the anterior upper abdomen, series 2, image 21. Small fat containing supraumbilical ventral abdominal wall hernia. There is no evidence of discrete abdominopelvic collection. Musculoskeletal: Posterior L5-S1 fusion hardware. Grade 1  anterolisthesis of L4 on L5 with prominent facet hypertrophy. There are no acute or suspicious osseous abnormalities. IMPRESSION: 1. Foci of free air in the upper abdomen most consistent with perforated viscus. Exact source is difficult to delineate, however there is wall thickening about both the gastric cardia as well as distal stomach suggesting gastric or upper GI source of perforation. 2. Marked intra and extrahepatic biliary ductal dilatation. There is a 4 mm rounded noncalcified density in the distal common bile duct suspicious for choledocholithiasis. However there is also pancreatic ductal dilatation and atrophy. Possibility of a CT occult obstructing pancreatic lesion is considered. Recommend further assessment with MRI after resolution of acute event. 3. Equivocal wall thickening versus nondistention of the distal descending and sigmoid colon. Few sigmoid colonic diverticula, but no focal diverticulitis as cause for perforation. 4. Splenomegaly. 5. Small fat containing supraumbilical ventral abdominal wall hernia. Aortic Atherosclerosis (ICD10-I70.0). Critical Value/emergent results were called by telephone at the time of interpretation on 10/31/2022 at 4:07 pm to provider Edilia Bo , who verbally acknowledged these results. Electronically Signed   By: Narda Rutherford M.D.   On: 10/31/2022 16:08   DG CHEST PORT 1 VIEW  Result Date: 10/03/2022 CLINICAL DATA:  Elevated BNP level. EXAM: PORTABLE CHEST 1 VIEW COMPARISON:  Radiograph yesterday. FINDINGS: Improved lung aeration from prior exam. Heart size upper normal. Stable mediastinal contours. Persistent ill-defined bibasilar opacities, worsening in the right lung base. No convincing pulmonary edema. There may be small pleural effusions. No pneumothorax. IMPRESSION: 1. Persistent ill-defined bibasilar opacities, worsening in the right lung base. This is greater than typically seen with atelectasis and suspicious for pneumonia or aspiration. 2. Possible  small pleural effusions. 3. Borderline cardiomegaly without convincing pulmonary edema. Electronically Signed   By: Narda Rutherford M.D.   On: 10/03/2022 16:43   VAS [place] LOWER EXTREMITY VENOUS (DVT)  Result Date: 10/03/2022  Lower Venous DVT Study Patient Name:  MYLANI GENTRY  Date of Exam:   10/02/2022 Medical Rec #: [id number]     Accession #:    [id number] Date of Birth: Jul 03, 1956     Patient Gender: F Patient Age:   78 years Exam Location:  [facility] Procedure:      VAS [place] LOWER EXTREMITY VENOUS (DVT) Referring Phys: A POWELL JR --------------------------------------------------------------------------------  Indications: Edema.  Comparison Study: No prior study on file Performing Technologist: Sherren Kerns RVS  Examination Guidelines: A complete evaluation includes B-mode imaging, spectral Doppler, color Doppler, and power Doppler as needed of all accessible portions of each vessel. Bilateral testing is considered an integral part of a complete examination. Limited examinations for reoccurring indications may be performed as noted. The reflux portion of the exam is performed with the patient in reverse Trendelenburg.  +--------+---------------+---------+-----------+----------------+-------------+ RIGHT   CompressibilityPhasicitySpontaneityProperties  Thrombus                                                                 Aging         +--------+---------------+---------+-----------+----------------+-------------+ CFV     Full                               pulsatile                                                                waveforms                     +--------+---------------+---------+-----------+----------------+-------------+ SFJ     Full                                                             +--------+---------------+---------+-----------+----------------+-------------+ FV Prox Full                                                              +--------+---------------+---------+-----------+----------------+-------------+ FV Mid  Full                                                             +--------+---------------+---------+-----------+----------------+-------------+ FV      Full                                                             Distal                                                                   +--------+---------------+---------+-----------+----------------+-------------+ PFV     Full                                                             +--------+---------------+---------+-----------+----------------+-------------+ POP  Full                               pulsatile                                                                waveforms                     +--------+---------------+---------+-----------+----------------+-------------+ PTV     Full                                                             +--------+---------------+---------+-----------+----------------+-------------+ PERO    Full                                                             +--------+---------------+---------+-----------+----------------+-------------+   +--------+---------------+---------+-----------+----------------+-------------+ LEFT    CompressibilityPhasicitySpontaneityProperties      Thrombus                                                                 Aging         +--------+---------------+---------+-----------+----------------+-------------+ CFV     Full                               pulsatile                                                                waveforms                     +--------+---------------+---------+-----------+----------------+-------------+ SFJ     Full                                                             +--------+---------------+---------+-----------+----------------+-------------+ FV Prox Full                                                              +--------+---------------+---------+-----------+----------------+-------------+ FV Mid  Full                                                             +--------+---------------+---------+-----------+----------------+-------------+  FV      Full                                                             Distal                                                                   +--------+---------------+---------+-----------+----------------+-------------+ PFV     Full                                                             +--------+---------------+---------+-----------+----------------+-------------+ POP     Full                               pulsatile                                                                waveforms                     +--------+---------------+---------+-----------+----------------+-------------+ PTV     Full                                                             +--------+---------------+---------+-----------+----------------+-------------+ PERO    Full                                                             +--------+---------------+---------+-----------+----------------+-------------+     Summary: BILATERAL: - No evidence of deep vein thrombosis seen in the lower extremities, bilaterally. -No evidence of popliteal cyst, bilaterally. RIGHT: pulsatile waveforms consistent with fluid overload  LEFT: Pulsatile waveforms consistent with fluid overload.  *See table(s) above for measurements and observations. Electronically signed by Sherald Hess MD on 10/03/2022 at 10:45:28 AM.    Final    EEG adult  Result Date: 10/03/2022 Charlsie Quest, MD     10/03/2022 10:12 AM Patient Name: NAYZETH ALTMAN MRN: [id number] Epilepsy Attending: Charlsie Quest Referring Physician/Provider: Angie Fava, DO Date: 10/03/2022 Duration: 23.45 mins Patient history:  66 y.o.  female she was seen in July with new onset seizure with concern for possible Xanax or alcohol withdrawal. Yesterday nursing received a call that she was tachycardic and on the response  of the nursing training, she found her to be slumped to the side with gaze to the left.  She got her supervisor and by the time she arrived, she was having generalized convulsive activity which lasted for approximately a minute.  She subsequently had a postictal state, and then gradually improved.EEG to evaluate for seizure Level of alertness: Awake, asleep AEDs during EEG study: LEV, Xanax Technical aspects: This EEG study was done with scalp electrodes positioned according to the 10-20 International system of electrode placement. Electrical activity was reviewed with band pass filter of 1-70Hz , sensitivity of 7 uV/mm, display speed of 5mm/sec with a  notched filter applied as appropriate. EEG data were recorded continuously and digitally stored.  Video monitoring was available and reviewed as appropriate. Description: The posterior dominant rhythm consists of 8-9 Hz activity of moderate voltage (25-35 uV) seen predominantly in posterior head regions, symmetric and reactive to eye opening and eye closing. Sleep was characterized by vertex waves, sleep spindles (12 to 14 Hz), maximal frontocentral region. Physiologic photic driving was seen during photic stimulation.  Hyperventilation was not performed.   IMPRESSION: This study is within normal limits. No seizures or epileptiform discharges were seen throughout the recording. A normal interictal EEG does not exclude the diagnosis of epilepsy. Charlsie Quest   MR BRAIN WO CONTRAST  Result Date: 10/03/2022 CLINICAL DATA:  Initial evaluation for seizure, weakness. EXAM: MRI HEAD WITHOUT CONTRAST TECHNIQUE: Multiplanar, multiecho pulse sequences of the brain and surrounding structures were obtained without intravenous contrast. COMPARISON:  Prior CT from 09/29/2022. FINDINGS:  Brain: Examination mildly degraded by motion artifact. Diffuse prominence of the CSF containing spaces compatible generalized age-related cerebral atrophy. Scattered patchy T2/FLAIR hyperintensity involving the periventricular deep white matter both cerebral hemispheres, most consistent with chronic small vessel ischemic disease, mild to moderate in nature. No abnormal foci of restricted diffusion to suggest acute or subacute ischemia. Gray-white matter differentiation well maintained. No encephalomalacia to suggest chronic cortical infarction or other insult. No foci of susceptibility artifact indicative of acute or chronic intracranial blood products. No mass lesion, midline shift or mass effect. Ventricles normal in size and morphology without hydrocephalus. No extra-axial fluid collection. Pituitary gland and suprasellar region within normal limits. No intrinsic temporal lobe abnormality. Vascular: Asymmetric FLAIR signal intensity involving the right transverse sinus, likely related to slow/sluggish flow. Major intracranial vascular flow voids are otherwise maintained. Skull and upper cervical spine: Craniocervical junction within normal limits. Visualized upper cervical spine demonstrates no significant finding. Bone marrow signal intensity within normal limits. No scalp soft tissue abnormality. Sinuses/Orbits: Globes and orbital soft tissues are within normal limits. Paranasal sinuses are largely clear. Small to moderate bilateral mastoid effusions. Visualized nasopharynx unremarkable. Other: None. IMPRESSION: 1. No acute intracranial abnormality. 2. Generalized age-related cerebral atrophy with mild to moderate chronic microvascular ischemic disease. Electronically Signed   By: Rise Mu M.D.   On: 10/03/2022 01:40   DG CHEST PORT 1 VIEW  Result Date: 10/02/2022 CLINICAL DATA:  Pneumonia.  Patient is in distress after code. EXAM: PORTABLE CHEST 1 VIEW COMPARISON:  09/30/2022 FINDINGS: Shallow  inspiration with infiltration or atelectasis in the lung bases. Mild progression on the right since the previous study. Cardiac enlargement. No vascular congestion. No pleural effusions. No pneumothorax. Mediastinal contours appear intact. Degenerative changes in the spine and shoulders. IMPRESSION: Shallow inspiration with atelectasis or infiltration in the lung bases, progressing since prior study. Electronically Signed   By: Burman Nieves M.D.   On: 10/02/2022 21:36  Catarina Hartshorn, DO  [facility]  If 7PM-7AM, please contact night-coverage [email or web address] Password [email or web address] 11/01/2022, 8:02 AM   LOS: 1 day

## 2022-11-01 NOTE — Progress Notes (Addendum)
Discussed MRCP results with Dr. Arnoldo Morale:   Dilated common duct measuring 12 mm with 2 small stones in the mid duct measuring up to 4 mm Sludge in a dilated gallbladder with pericholecystic fluid, equivocal for acute cholecystitis  Would be unusual for soley microperforation of stomach to cause this degree of sepsis.  Source of sepsis may be due to gastric microperforation vs cholecystitis --Dr. Arnoldo Morale contacting [facility] at [facility] for possible ERCP --General surgery also aware of patient. --continue cefepime and metronidazole --pt now off levophed >12 hrs and remains stable. --daughter updated  DTat

## 2022-11-02 DIAGNOSIS — K668 Other specified disorders of peritoneum: Secondary | ICD-10-CM | POA: Diagnosis not present

## 2022-11-02 DIAGNOSIS — A419 Sepsis, unspecified organism: Secondary | ICD-10-CM | POA: Diagnosis not present

## 2022-11-02 DIAGNOSIS — K8070 Calculus of gallbladder and bile duct without cholecystitis without obstruction: Secondary | ICD-10-CM

## 2022-11-02 DIAGNOSIS — K659 Peritonitis, unspecified: Secondary | ICD-10-CM | POA: Diagnosis not present

## 2022-11-02 LAB — COMPREHENSIVE METABOLIC PANEL
ALT: 33 U/L (ref 0–44)
AST: 21 U/L (ref 15–41)
Albumin: 3.1 g/dL — ABNORMAL LOW (ref 3.5–5.0)
Alkaline Phosphatase: 193 U/L — ABNORMAL HIGH (ref 38–126)
Anion gap: 7 (ref 5–15)
BUN: 10 mg/dL (ref 8–23)
CO2: 21 mmol/L — ABNORMAL LOW (ref 22–32)
Calcium: 9 mg/dL (ref 8.9–10.3)
Chloride: 106 mmol/L (ref 98–111)
Creatinine, Ser: 0.6 mg/dL (ref 0.44–1.00)
GFR, Estimated: >60 mL/min (ref >60)
Glucose, Bld: 114 mg/dL — ABNORMAL HIGH (ref 70–99)
Potassium: 3.9 mmol/L (ref 3.5–5.1)
Sodium: 134 mmol/L — ABNORMAL LOW (ref 135–145)
Total Bilirubin: 0.4 mg/dL (ref 0.3–1.2)
Total Protein: 6.5 g/dL (ref 6.5–8.1)

## 2022-11-02 LAB — CBC
HCT: 34.7 % — ABNORMAL LOW (ref 36.0–46.0)
Hemoglobin: 10.7 g/dL — ABNORMAL LOW (ref 12.0–15.0)
MCH: 25 pg — ABNORMAL LOW (ref 26.0–34.0)
MCHC: 30.8 g/dL (ref 30.0–36.0)
MCV: 81.1 fL (ref 80.0–100.0)
Platelets: 533 10*3/uL — ABNORMAL HIGH (ref 150–400)
RBC: 4.28 MIL/uL (ref 3.87–5.11)
RDW: 15.7 % — ABNORMAL HIGH (ref 11.5–15.5)
WBC: 20.8 10*3/uL — ABNORMAL HIGH (ref 4.0–10.5)
nRBC: 0 % (ref 0.0–0.2)

## 2022-11-02 LAB — MAGNESIUM: Magnesium: 1.9 mg/dL (ref 1.7–2.4)

## 2022-11-02 MED ORDER — SODIUM CHLORIDE 0.9 % IV SOLN
2.0000 g | Freq: Three times a day (TID) | INTRAVENOUS | Status: DC
  Administered 2022-11-02 – 2022-11-06 (×14): 2 g via INTRAVENOUS
  Filled 2022-11-02 (×14): qty 12.5

## 2022-11-02 MED FILL — Medication: Qty: 1 | Status: AC

## 2022-11-02 NOTE — Progress Notes (Signed)
PROGRESS NOTE  Jamie [name] ZOX:[id number] DOB: Dec 24, 1956 DOA: 10/31/2022 PCP: Barbie Banner, MD  Brief History:  66 year old female with a history of chronic pain syndrome, anxiety, alcohol use in remission, chronic back pain, seizure presenting with worsening upper abdominal pain that began on 10/29/2022.  Notably, the patient was recently discharged from the hospital after a stay from 09/28/2029 to 10/04/2022 where she was treated for sepsis secondary to pneumonia.  The patient required vasopressors for short period of time during the hospitalization.  Her hospital stay was complicated by a seizure for which she was started on Keppra.  The patient was also noted to have symptomatic bradycardia which was thought to be due to her metabolic abnormalities.  This resolved with treatment of her acute medical issues.  The patient was discharged home with home health PT. Apparently, the patient had progressive weakness since 10/29/2022 to the point where she was unable to get off the couch.  Upon EMS  activation, the patient was found to be covered in urine and feces.  The patient complains of her chronic pain complaints all over, primarily back pain.  She denies any fevers or chills, but endorsed nausea and vomiting.  There is no diarrhea, hematochezia, melena.  She complains of chronic shortness of breath. In the ED, the patient bradycardic and hypotensive.  She was started on vasopressors.  WBC 29.7, hemoglobin 1.4, platelets 461,000.  Sodium 135, potassium 3.8, bicarbonate 18, serum creatinine 0.88.  CT of the abdomen and pelvis showed foci of free air in the upper abdomen with moderate thickening of the gastric cardia and distal gastric body.  There was also marked intrahepatic and extrahepatic ductal dilatation with a 4 mm round density in the distal CBD.  There was also pancreatic atrophy and pancreatic duct dilatation.  The patient was started on cefepime and metronidazole and IV fluids.  We are  being consulted for bradycardia and medical management.   Assessment/Plan: Septic shock -Secondary to peritonitis and perforated viscus -Patient has been weaned off vasopressors; and has remained stable. -Continue to maintain adequate hydration. -Lactic acid peaked at 2.1 -Continue cefepime and metronidazole -UA negative for pyuria -Follow final blood cultures results. -Currently hemodynamically stable and afebrile.  Peritonitis -Secondary to microperforation in her stomach  -Patient uses BC powders on a daily basis for several years -Patient has been allowed to have sips of clear liquids. -Continue cefepime and metronidazole -Continue PPI twice a day.  Bradycardia -Likely secondary to the patient's metabolic derangements and acute medical condition -Overall improved -Personally reviewed EKG--sinus rhythm, no AV blocks noted -09/29/2022 echo EF 64%, grade 2 DD, moderate elevated PASP, trivial MR -Continue telemetry monitoring.  Biliary ductal dilatation -Noted on CT abdomen -There is concern for possible choledocholithiasis -MRCP demonstrated multiple distal common bile duct stones with a common bile thought that measures 12 mm -After discussing with general surgery plan is for patient to go to [facility] for ERCP and most likely laparoscopic cholecystectomy.  Seizure disorder -Patient had an unprovoked seizure during her last hospital admission -Continue Keppra IV -No seizure activity currently appreciated.  Opioid dependence -PDMP reviewed--patient receives oxycodone 10 mg, #120 on a monthly basis. -Continue as needed analgesic therapy.  Anxiety -PDMP reviewed -Patient received Xanax 1 mg, #90 on a monthly basis -Continue as needed anxiolytic.  Stage II sacral pressure ulcer -Present on admission -Local wound care    Family Communication:  no  Family at bedside  Consultants:  n/a  Code Status:  FULL   DVT Prophylaxis:   [place]  Lovenox   Procedures: As Listed in Progress Note Above  Antibiotics: None  Subjective: Still complaining of abdominal pain; no vomiting.  Still slightly nauseated but tolerating clear liquids.  Passing gas but no bowel movement.  Afebrile and hemodynamically stable currently.  Objective: Vitals:   11/02/22 1100 11/02/22 1130 11/02/22 1200 11/02/22 1607  BP: (!) 146/78  139/65   Pulse: 89  84   Resp: 16  (!) 9   Temp:  99.1 F (37.3 C)  98.9 F (37.2 C)  TempSrc:  Oral  Oral  SpO2: 97%  97%   Weight:      Height:        Intake/Output Summary (Last 24 hours) at 11/02/2022 1748 Last data filed at 11/02/2022 0433 Gross per 24 hour  Intake 507.27 ml  Output 3300 ml  Net -2792.73 ml   Weight change:  Exam: General exam: Alert, awake, oriented x 3; following commands appropriately; still complaining of abdominal pain.  Reports no active vomiting. Respiratory system: Good air movement bilaterally; no wheezing, no crackles. Cardiovascular system:RRR. No murmurs, rubs, gallops. Gastrointestinal system: Abdomen is nondistended, soft and with appreciated generalized tenderness on palpation.. No organomegaly or masses felt. Normal bowel sounds heard. Central nervous system: Alert and oriented. No focal neurological deficits. Extremities: No C/C/E, +pedal pulses Skin: No petechiae. Psychiatry: Judgement and insight appear normal. Mood & affect appropriate.    Data Reviewed: I have personally reviewed following labs and imaging studies  Basic Metabolic Panel: Recent Labs  Lab 10/31/22 1317 11/01/22 0336 11/02/22 0358  NA 136 135 134*  K 3.5 3.8 3.9  CL 110 113* 106  CO2 14* 18* 21*  GLUCOSE 97 90 114*  BUN 23 16 10   CREATININE 0.98 0.88 0.60  CALCIUM 8.7* 8.3* 9.0  MG  --  2.1 1.9  PHOS  --  3.0  --    Liver Function Tests: Recent Labs  Lab 10/31/22 1317 11/01/22 0336 11/02/22 0358  AST 187* 53* 21  ALT 86* 52* 33  ALKPHOS 263* 203* 193*  BILITOT 0.5 0.5 0.4   PROT 7.1 6.3* 6.5  ALBUMIN 3.7 3.2* 3.1*   CBC: Recent Labs  Lab 10/31/22 1317 11/01/22 0336 11/02/22 0358  WBC 29.7* 13.9* 20.8*  NEUTROABS 27.7*  --   --   HGB 11.4* 9.7* 10.7*  HCT 37.3 31.2* 34.7*  MCV 83.3 82.5 81.1  PLT 461* 453* 533*   Urine analysis:    Component Value Date/Time   COLORURINE YELLOW 10/31/2022 2051   APPEARANCEUR CLEAR 10/31/2022 2051   LABSPEC 1.044 (H) 10/31/2022 2051   PHURINE 5.0 10/31/2022 2051   GLUCOSEU NEGATIVE 10/31/2022 2051   HGBUR NEGATIVE 10/31/2022 2051   BILIRUBINUR NEGATIVE 10/31/2022 2051   KETONESUR NEGATIVE 10/31/2022 2051   PROTEINUR NEGATIVE 10/31/2022 2051   NITRITE NEGATIVE 10/31/2022 2051   LEUKOCYTESUR NEGATIVE 10/31/2022 2051   Sepsis Labs:  Recent Results (from the past 240 hour(s))  Blood culture (routine x 2)     Status: None (Preliminary result)   Collection Time: 10/31/22  2:57 PM   Specimen: BLOOD LEFT HAND  Result Value Ref Range Status   Specimen Description BLOOD LEFT HAND  Final   Special Requests   Final    BOTTLES DRAWN AEROBIC AND ANAEROBIC Blood Culture adequate volume   Culture   Final    NO GROWTH 2 DAYS Performed at [facility], [address], [place], [place] [address]    Report Status PENDING  Incomplete  Blood culture (routine x 2)     Status: None (Preliminary result)   Collection Time: 10/31/22  2:57 PM   Specimen: BLOOD RIGHT HAND  Result Value Ref Range Status   Specimen Description BLOOD RIGHT HAND  Final   Special Requests   Final    BOTTLES DRAWN AEROBIC ONLY Blood Culture results may not be optimal due to an inadequate volume of blood received in culture bottles   Culture   Final    NO GROWTH 2 DAYS Performed at [facility], [address] [place], [place] [address]    Report Status PENDING  Incomplete  MRSA Next Gen by PCR, Nasal     Status: None   Collection Time: 10/31/22 10:06 PM   Specimen: Nasal Mucosa; Nasal Swab  Result Value Ref Range Status   MRSA by PCR Next Gen  NOT DETECTED NOT DETECTED Final    Comment: (NOTE) The GeneXpert MRSA Assay (FDA approved for NASAL specimens only), is one component of a comprehensive MRSA colonization surveillance program. It is not intended to diagnose MRSA infection nor to guide or monitor treatment for MRSA infections. Test performance is not FDA approved in patients less than 23 years old. Performed at [facility], [address] [place], [place] [address]      Scheduled Meds:  (feeding supplement) PROSource Plus  30 mL Oral TID BM   amLODipine  5 mg Oral Daily   Chlorhexidine Gluconate Cloth  6 each Topical Daily   enoxaparin (LOVENOX) injection  40 mg Subcutaneous Q24H   pantoprazole (PROTONIX) IV  40 mg Intravenous Q12H   pneumococcal 20-valent conjugate vaccine  0.5 mL Intramuscular Tomorrow-1000   Continuous Infusions:  sodium chloride     ceFEPime (MAXIPIME) IV 2 g (11/02/22 1724)   lactated ringers 100 mL/hr at 11/02/22 1506   levETIRAcetam 500 mg (11/02/22 0854)   metronidazole 500 mg (11/02/22 0417)   norepinephrine (LEVOPHED) Adult infusion Stopped (10/31/22 1947)    Procedures/Studies: MR ABDOMEN MRCP W WO CONTAST  Result Date: 11/01/2022 CLINICAL DATA:  Cholelithiasis, choledocholithiasis EXAM: MRI ABDOMEN WITHOUT AND WITH CONTRAST (INCLUDING MRCP) TECHNIQUE: Multiplanar multisequence MR imaging of the abdomen was performed both before and after the administration of intravenous contrast. Heavily T2-weighted images of the biliary and pancreatic ducts were obtained, and three-dimensional MRCP images were rendered by post processing. CONTRAST:  71mL GADAVIST GADOBUTROL 1 MMOL/ML IV SOLN COMPARISON:  Multiple priors including CT October 31, 2022 and May 08, 2016 FINDINGS: Despite efforts by the technologist and patient, motion artifact is present on today's exam and could not be eliminated. This reduces exam sensitivity and specificity. Lower chest: Small bilateral pleural effusions with consolidation  in the right lower lobe atelectasis versus infiltrate. Hepatobiliary: No significant hepatic steatosis. No suspicious hepatic lesion. Sludge in a distended gallbladder with pericholecystic fluid. Dilated common duct measures 12 mm with 2 small stones in the mid duct measuring up to 4 mm on image 19/6 and 15/4. Pancreas: Prominence of the pancreatic duct measuring up to 2.5 mm in the pancreatic body on image 14/6, difficult to evaluate given the motion degrading on this examination but appearing similar dating back to 2017. Pancreatic atrophy is similar prior. Spleen:  No splenomegaly. Adrenals/Urinary Tract: Bilateral adrenal glands appear normal. Hydronephrosis. No solid enhancing hepatic lesion. Stomach/Bowel: No pathologic dilation of bowel. Vascular/Lymphatic: Normal caliber abdominal aorta. No pathologically enlarged abdominal lymph nodes. Other: Trace abdominopelvic free  fluid with mesenteric stranding. Body wall edema. Musculoskeletal: Posterior lumbar fixation hardware. IMPRESSION: Motion degraded examination.  Within this context: 1. Dilated common duct measuring 12 mm with 2 small stones in the mid duct measuring up to 4 mm. 2. Sludge in a dilated gallbladder with pericholecystic fluid, equivocal for acute cholecystitis. Consider further evaluation with nuclear medicine HIDA scan. 3. Prominence of the pancreatic duct measuring up to 2.5 mm in the pancreatic body, difficult to evaluate given the motion degrading on this examination but appearing similar dating back to 2017, relative stability is reassuring and depth may be within normal limits for this patient. However, given given that this is incompletely evaluated/characterized on this examination would suggest follow-up nonemergent MRCP with and without intravenous contrast preferably as an outpatient upon resolution of patient's current symptomatology when they are better able to follow commands including breath hold. 4. Small bilateral pleural  effusions with consolidation in the right lower lobe atelectasis versus infiltrate. 5. Trace abdominopelvic free fluid with mesenteric stranding and subcutaneous edema. Electronically Signed   By: Maudry Mayhew M.D.   On: 11/01/2022 14:38   MR 3D Recon At Scanner  Result Date: 11/01/2022 CLINICAL DATA:  Cholelithiasis, choledocholithiasis EXAM: MRI ABDOMEN WITHOUT AND WITH CONTRAST (INCLUDING MRCP) TECHNIQUE: Multiplanar multisequence MR imaging of the abdomen was performed both before and after the administration of intravenous contrast. Heavily T2-weighted images of the biliary and pancreatic ducts were obtained, and three-dimensional MRCP images were rendered by post processing. CONTRAST:  70mL GADAVIST GADOBUTROL 1 MMOL/ML IV SOLN COMPARISON:  Multiple priors including CT October 31, 2022 and May 08, 2016 FINDINGS: Despite efforts by the technologist and patient, motion artifact is present on today's exam and could not be eliminated. This reduces exam sensitivity and specificity. Lower chest: Small bilateral pleural effusions with consolidation in the right lower lobe atelectasis versus infiltrate. Hepatobiliary: No significant hepatic steatosis. No suspicious hepatic lesion. Sludge in a distended gallbladder with pericholecystic fluid. Dilated common duct measures 12 mm with 2 small stones in the mid duct measuring up to 4 mm on image 19/6 and 15/4. Pancreas: Prominence of the pancreatic duct measuring up to 2.5 mm in the pancreatic body on image 14/6, difficult to evaluate given the motion degrading on this examination but appearing similar dating back to 2017. Pancreatic atrophy is similar prior. Spleen:  No splenomegaly. Adrenals/Urinary Tract: Bilateral adrenal glands appear normal. Hydronephrosis. No solid enhancing hepatic lesion. Stomach/Bowel: No pathologic dilation of bowel. Vascular/Lymphatic: Normal caliber abdominal aorta. No pathologically enlarged abdominal lymph nodes. Other: Trace  abdominopelvic free fluid with mesenteric stranding. Body wall edema. Musculoskeletal: Posterior lumbar fixation hardware. IMPRESSION: Motion degraded examination.  Within this context: 1. Dilated common duct measuring 12 mm with 2 small stones in the mid duct measuring up to 4 mm. 2. Sludge in a dilated gallbladder with pericholecystic fluid, equivocal for acute cholecystitis. Consider further evaluation with nuclear medicine HIDA scan. 3. Prominence of the pancreatic duct measuring up to 2.5 mm in the pancreatic body, difficult to evaluate given the motion degrading on this examination but appearing similar dating back to 2017, relative stability is reassuring and depth may be within normal limits for this patient. However, given given that this is incompletely evaluated/characterized on this examination would suggest follow-up nonemergent MRCP with and without intravenous contrast preferably as an outpatient upon resolution of patient's current symptomatology when they are better able to follow commands including breath hold. 4. Small bilateral pleural effusions with consolidation in the right lower lobe atelectasis  versus infiltrate. 5. Trace abdominopelvic free fluid with mesenteric stranding and subcutaneous edema. Electronically Signed   By: Maudry Mayhew M.D.   On: 11/01/2022 14:38   DG Chest Portable 1 View  Result Date: 10/31/2022 CLINICAL DATA:  Central line placement EXAM: PORTABLE CHEST 1 VIEW COMPARISON:  Radiographs 10/03/2022 FINDINGS: New left IJ CVC tip in the low SVC. Remainder unchanged from 10/03/2022. Scarring/atelectasis left lung base. Elevated left hemidiaphragm. Stable cardiomediastinal silhouette. Aortic atherosclerotic calcification. No acute osseous abnormality. No pleural effusion or pneumothorax. IMPRESSION: Left IJ CVC tip in the low SVC. Electronically Signed   By: Minerva Fester M.D.   On: 10/31/2022 21:54   [place] EKG SITE RITE  Result Date: 10/31/2022 If Site Rite image not  attached, placement could not be confirmed due to current cardiac rhythm.  CT ABDOMEN PELVIS W CONTRAST  Result Date: 10/31/2022 CLINICAL DATA:  Sepsis. EXAM: CT ABDOMEN AND PELVIS WITH CONTRAST TECHNIQUE: Multidetector CT imaging of the abdomen and pelvis was performed using the standard protocol following bolus administration of intravenous contrast. RADIATION DOSE REDUCTION: This exam was performed according to the departmental dose-optimization program which includes automated exposure control, adjustment of the mA and/or kV according to patient size and/or use of iterative reconstruction technique. CONTRAST:  74mL OMNIPAQUE IOHEXOL 300 MG/ML  SOLN COMPARISON:  Noncontrast CT 09/29/2021 FINDINGS: Lower chest: Dependent atelectasis with trace pleural thickening in the lung bases. Hepatobiliary: Focal fatty infiltration adjacent to the falciform ligament. No evidence of discrete intrahepatic lesion. There is marked intra and extrahepatic biliary ductal dilatation. The common bile duct measures 17 mm. The gallbladder is moderately distended. No calcified gallstone. There is a 4 mm rounded noncalcified density in the distal common bile duct series 2, image 35 suspicious for choledocholithiasis. Pancreas: Pancreatic atrophy. Pancreatic ductal dilatation of 5 mm. There is no obvious obstructing pancreatic mass. Question small pancreatic cyst or side branch lesion series 2, image 30. Spleen: The spleen is enlarged spanning 15 cm cranial caudal. No focal abnormalities. Adrenals/Urinary Tract: Normal adrenal glands. No hydronephrosis or perinephric edema. Homogeneous renal enhancement with symmetric excretion on delayed phase imaging. Small cyst in the upper left kidney. No further follow-up imaging is needed. No visualized renal calculi or solid renal lesion. Urinary bladder is physiologically distended without wall thickening. Stomach/Bowel: There are foci of free air in the upper abdomen most consistent with  perforated viscus, for example series 2, image 22. There is moderate wall thickening about the gastric cardia which is ill-defined. Moderate wall thickening involves the distal gastric body is well. Occasional fluid-filled loops of small bowel in the right abdomen, but no evidence of small-bowel obstruction. There is no small bowel pneumatosis or discrete small bowel inflammation to suggest small bowel as site of perforation. The appendix is tentatively visualized and normal series 6, image 33. Formed stool in the ascending and splenic flexure of the colon. The distal descending and sigmoid colon are nondistended. Equivocal wall thickening versus nondistention. Few sigmoid colonic diverticula, but no focal diverticulitis as cause for perforation. Vascular/Lymphatic: Aortic atherosclerosis without aneurysm. Patent portal, splenic and superior mesenteric veins. No portal venous or mesenteric gas. Limited assessment for adenopathy on the current exam, no obvious enlarged lymph nodes. Reproductive: Retroverted uterus.  No adnexal mass. Other: Small foci of free air in the anterior upper abdomen, series 2, image 21. Small fat containing supraumbilical ventral abdominal wall hernia. There is no evidence of discrete abdominopelvic collection. Musculoskeletal: Posterior L5-S1 fusion hardware. Grade 1 anterolisthesis of L4 on L5  with prominent facet hypertrophy. There are no acute or suspicious osseous abnormalities. IMPRESSION: 1. Foci of free air in the upper abdomen most consistent with perforated viscus. Exact source is difficult to delineate, however there is wall thickening about both the gastric cardia as well as distal stomach suggesting gastric or upper GI source of perforation. 2. Marked intra and extrahepatic biliary ductal dilatation. There is a 4 mm rounded noncalcified density in the distal common bile duct suspicious for choledocholithiasis. However there is also pancreatic ductal dilatation and atrophy.  Possibility of a CT occult obstructing pancreatic lesion is considered. Recommend further assessment with MRI after resolution of acute event. 3. Equivocal wall thickening versus nondistention of the distal descending and sigmoid colon. Few sigmoid colonic diverticula, but no focal diverticulitis as cause for perforation. 4. Splenomegaly. 5. Small fat containing supraumbilical ventral abdominal wall hernia. Aortic Atherosclerosis (ICD10-I70.0). Critical Value/emergent results were called by telephone at the time of interpretation on 10/31/2022 at 4:07 pm to provider Edilia Bo , who verbally acknowledged these results. Electronically Signed   By: Narda Rutherford M.D.   On: 10/31/2022 16:08    Vassie Loll, MD  [facility]  If 7PM-7AM, please contact night-coverage [email or web address] Password [email or web address] 11/02/2022, 5:48 PM   LOS: 2 days

## 2022-11-03 DIAGNOSIS — K8070 Calculus of gallbladder and bile duct without cholecystitis without obstruction: Secondary | ICD-10-CM | POA: Diagnosis not present

## 2022-11-03 DIAGNOSIS — K668 Other specified disorders of peritoneum: Secondary | ICD-10-CM | POA: Diagnosis not present

## 2022-11-03 DIAGNOSIS — A419 Sepsis, unspecified organism: Secondary | ICD-10-CM | POA: Diagnosis not present

## 2022-11-03 DIAGNOSIS — K659 Peritonitis, unspecified: Secondary | ICD-10-CM | POA: Diagnosis not present

## 2022-11-03 DIAGNOSIS — L8990 Pressure ulcer of unspecified site, unspecified stage: Secondary | ICD-10-CM | POA: Insufficient documentation

## 2022-11-03 NOTE — Progress Notes (Signed)
PROGRESS NOTE  Jamie [name] UXL:[id number] DOB: 1956/12/26 DOA: 10/31/2022 PCP: Barbie Banner, MD  Brief History:  66 year old female with a history of chronic pain syndrome, anxiety, alcohol use in remission, chronic back pain, seizure presenting with worsening upper abdominal pain that began on 10/29/2022.  Notably, the patient was recently discharged from the hospital after a stay from 09/28/2029 to 10/04/2022 where she was treated for sepsis secondary to pneumonia.  The patient required vasopressors for short period of time during the hospitalization.  Her hospital stay was complicated by a seizure for which she was started on Keppra.  The patient was also noted to have symptomatic bradycardia which was thought to be due to her metabolic abnormalities.  This resolved with treatment of her acute medical issues.  The patient was discharged home with home health PT. Apparently, the patient had progressive weakness since 10/29/2022 to the point where she was unable to get off the couch.  Upon EMS  activation, the patient was found to be covered in urine and feces.  The patient complains of her chronic pain complaints all over, primarily back pain.  She denies any fevers or chills, but endorsed nausea and vomiting.  There is no diarrhea, hematochezia, melena.  She complains of chronic shortness of breath. In the ED, the patient bradycardic and hypotensive.  She was started on vasopressors.  WBC 29.7, hemoglobin 1.4, platelets 461,000.  Sodium 135, potassium 3.8, bicarbonate 18, serum creatinine 0.88.  CT of the abdomen and pelvis showed foci of free air in the upper abdomen with moderate thickening of the gastric cardia and distal gastric body.  There was also marked intrahepatic and extrahepatic ductal dilatation with a 4 mm round density in the distal CBD.  There was also pancreatic atrophy and pancreatic duct dilatation.  The patient was started on cefepime and metronidazole and IV fluids.  We are  being consulted for bradycardia and medical management.   Assessment/Plan: Septic shock -Secondary to peritonitis and perforated viscus -Patient has been weaned off vasopressors; and has remained stable without them for 48 hours now.. -Continue to maintain adequate hydration. -Lactic acid peaked at 2.1 -Continue cefepime and metronidazole -UA negative for pyuria -Follow final blood cultures results. -Currently hemodynamically stable and afebrile.  Peritonitis -Secondary to microperforation in her stomach  -Patient uses BC powders on a daily basis for several years -Patient has been allowed to have sips of clear liquids. -Continue cefepime and metronidazole -Continue PPI twice a day.  Bradycardia -Likely secondary to the patient's metabolic derangements and acute medical condition -Overall improved -Personally reviewed EKG--sinus rhythm, no AV blocks noted -09/29/2022 echo EF 64%, grade 2 DD, moderate elevated PASP, trivial MR -Continue telemetry monitoring.  Biliary ductal dilatation -Noted on CT abdomen -There is concern for possible choledocholithiasis -MRCP demonstrated multiple distal common bile duct stones with a common bile thought that measures 12 mm -After discussing with general surgery plan is for patient to go to [facility] for ERCP and most likely laparoscopic cholecystectomy after that. -GI and general surgery at [facility] Aware of oncoming transfer.  Seizure disorder -Patient had an unprovoked seizure during her last hospital admission -Continue Keppra IV -No seizure activity currently appreciated.  Opioid dependence -PDMP reviewed--patient receives oxycodone 10 mg, #120 on a monthly basis. -Continue as needed analgesic therapy.  Anxiety -PDMP reviewed -Patient received Xanax 1 mg, #90 on a monthly basis -Continue as needed anxiolytic.  Stage II sacral pressure  ulcer -Present on admission -Continue local wound care and preventive measures; constant  repositioning. -There is no signs of supportive infection   Family Communication:  no  Family at bedside  Consultants:  n/a  Code Status:  FULL   DVT Prophylaxis:   [place] Lovenox   Procedures: As Listed in Progress Note Above  Antibiotics: None  Subjective: Still complaining of abdominal pain and intermittent nausea.  No vomiting, no fever, no chest pain, no overnight events.  Objective: Vitals:   11/02/22 2000 11/02/22 2300 11/03/22 0000 11/03/22 0400  BP:  (!) 155/74    Pulse:  84    Resp:  18    Temp: 98.8 F (37.1 C)  98.3 F (36.8 C) 98.9 F (37.2 C)  TempSrc: Oral  Oral Oral  SpO2:  95%    Weight:      Height:        Intake/Output Summary (Last 24 hours) at 11/03/2022 1311 Last data filed at 11/03/2022 0954 Gross per 24 hour  Intake 1446.4 ml  Output 1700 ml  Net -253.6 ml   Weight change:   Exam: General exam: Alert, awake, oriented x 3; in no major distress.  Still having intermittent nausea and abdominal pain. Respiratory system: Positive scattered rhonchi; no wheezing or crackles.  Good saturation on chronic supplementation. Cardiovascular system:RRR. No rubs or gallops. Gastrointestinal system: Abdomen is tender to palpation, no guarding, positive bowel sounds. Central nervous system: Alert and oriented. No focal neurological deficits. Extremities: No cyanosis, clubbing or edema. Skin: No petechiae; stage II pressure injury in her sacrum appreciated at time of admission.  No signs of superimposed infection. Psychiatry: Judgement and insight appear normal. Mood & affect appropriate.    Data Reviewed: I have personally reviewed following labs and imaging studies  Basic Metabolic Panel: Recent Labs  Lab 10/31/22 1317 11/01/22 0336 11/02/22 0358  NA 136 135 134*  K 3.5 3.8 3.9  CL 110 113* 106  CO2 14* 18* 21*  GLUCOSE 97 90 114*  BUN CREATININE 0.98 0.88 0.60  CALCIUM 8.7* 8.3* 9.0  MG  --  2.1 1.9  PHOS  --  3.0  --    Liver  Function Tests: Recent Labs  Lab 10/31/22 1317 11/01/22 0336 11/02/22 0358  AST 187* 53* 21  ALT 86* 52* 33  ALKPHOS 263* 203* 193*  BILITOT 0.5 0.5 0.4  PROT 7.1 6.3* 6.5  ALBUMIN 3.7 3.2* 3.1*   CBC: Recent Labs  Lab 10/31/22 1317 11/01/22 0336 11/02/22 0358  WBC 29.7* 13.9* 20.8*  NEUTROABS 27.7*  --   --   HGB 11.4* 9.7* 10.7*  HCT 37.3 31.2* 34.7*  MCV 83.3 82.5 81.1  PLT 461* 453* 533*   Urine analysis:    Component Value Date/Time   COLORURINE YELLOW 10/31/2022 2051   APPEARANCEUR CLEAR 10/31/2022 2051   LABSPEC 1.044 (H) 10/31/2022 2051   PHURINE 5.0 10/31/2022 2051   GLUCOSEU NEGATIVE 10/31/2022 2051   HGBUR NEGATIVE 10/31/2022 2051   BILIRUBINUR NEGATIVE 10/31/2022 2051   KETONESUR NEGATIVE 10/31/2022 2051   PROTEINUR NEGATIVE 10/31/2022 2051   NITRITE NEGATIVE 10/31/2022 2051   LEUKOCYTESUR NEGATIVE 10/31/2022 2051   Sepsis Labs:  Recent Results (from the past 240 hour(s))  Blood culture (routine x 2)     Status: None (Preliminary result)   Collection Time: 10/31/22  2:57 PM   Specimen: BLOOD LEFT HAND  Result Value Ref Range Status   Specimen Description BLOOD LEFT HAND  Final  Special Requests   Final    BOTTLES DRAWN AEROBIC AND ANAEROBIC Blood Culture adequate volume   Culture   Final    NO GROWTH 2 DAYS Performed at [facility], [address] [place], [place] [address]    Report Status PENDING  Incomplete  Blood culture (routine x 2)     Status: None (Preliminary result)   Collection Time: 10/31/22  2:57 PM   Specimen: BLOOD RIGHT HAND  Result Value Ref Range Status   Specimen Description BLOOD RIGHT HAND  Final   Special Requests   Final    BOTTLES DRAWN AEROBIC ONLY Blood Culture results may not be optimal due to an inadequate volume of blood received in culture bottles   Culture   Final    NO GROWTH 2 DAYS Performed at [facility], [address], [place], [place] [address]    Report Status PENDING  Incomplete  MRSA Next Gen by  PCR, Nasal     Status: None   Collection Time: 10/31/22 10:06 PM   Specimen: Nasal Mucosa; Nasal Swab  Result Value Ref Range Status   MRSA by PCR Next Gen NOT DETECTED NOT DETECTED Final    Comment: (NOTE) The GeneXpert MRSA Assay (FDA approved for NASAL specimens only), is one component of a comprehensive MRSA colonization surveillance program. It is not intended to diagnose MRSA infection nor to guide or monitor treatment for MRSA infections. Test performance is not FDA approved in patients less than 58 years old. Performed at [facility], [address] [place], [place] [address]      Scheduled Meds:  (feeding supplement) PROSource Plus  30 mL Oral TID BM   amLODipine  5 mg Oral Daily   Chlorhexidine Gluconate Cloth  6 each Topical Daily   enoxaparin (LOVENOX) injection  40 mg Subcutaneous Q24H   pantoprazole (PROTONIX) IV  40 mg Intravenous Q12H   pneumococcal 20-valent conjugate vaccine  0.5 mL Intramuscular Tomorrow-1000   Continuous Infusions:  sodium chloride     ceFEPime (MAXIPIME) IV 2 g (11/03/22 0942)   lactated ringers 100 mL/hr at 11/03/22 0823   levETIRAcetam 500 mg (11/03/22 0835)   metronidazole 500 mg (11/03/22 0616)    Procedures/Studies: MR ABDOMEN MRCP W WO CONTAST  Result Date: 11/01/2022 CLINICAL DATA:  Cholelithiasis, choledocholithiasis EXAM: MRI ABDOMEN WITHOUT AND WITH CONTRAST (INCLUDING MRCP) TECHNIQUE: Multiplanar multisequence MR imaging of the abdomen was performed both before and after the administration of intravenous contrast. Heavily T2-weighted images of the biliary and pancreatic ducts were obtained, and three-dimensional MRCP images were rendered by post processing. CONTRAST:  57mL GADAVIST GADOBUTROL 1 MMOL/ML IV SOLN COMPARISON:  Multiple priors including CT October 31, 2022 and May 08, 2016 FINDINGS: Despite efforts by the technologist and patient, motion artifact is present on today's exam and could not be eliminated. This reduces exam  sensitivity and specificity. Lower chest: Small bilateral pleural effusions with consolidation in the right lower lobe atelectasis versus infiltrate. Hepatobiliary: No significant hepatic steatosis. No suspicious hepatic lesion. Sludge in a distended gallbladder with pericholecystic fluid. Dilated common duct measures 12 mm with 2 small stones in the mid duct measuring up to 4 mm on image 19/6 and 15/4. Pancreas: Prominence of the pancreatic duct measuring up to 2.5 mm in the pancreatic body on image 14/6, difficult to evaluate given the motion degrading on this examination but appearing similar dating back to 2017. Pancreatic atrophy is similar prior. Spleen:  No splenomegaly. Adrenals/Urinary Tract: Bilateral adrenal glands appear normal.  Hydronephrosis. No solid enhancing hepatic lesion. Stomach/Bowel: No pathologic dilation of bowel. Vascular/Lymphatic: Normal caliber abdominal aorta. No pathologically enlarged abdominal lymph nodes. Other: Trace abdominopelvic free fluid with mesenteric stranding. Body wall edema. Musculoskeletal: Posterior lumbar fixation hardware. IMPRESSION: Motion degraded examination.  Within this context: 1. Dilated common duct measuring 12 mm with 2 small stones in the mid duct measuring up to 4 mm. 2. Sludge in a dilated gallbladder with pericholecystic fluid, equivocal for acute cholecystitis. Consider further evaluation with nuclear medicine HIDA scan. 3. Prominence of the pancreatic duct measuring up to 2.5 mm in the pancreatic body, difficult to evaluate given the motion degrading on this examination but appearing similar dating back to 2017, relative stability is reassuring and depth may be within normal limits for this patient. However, given given that this is incompletely evaluated/characterized on this examination would suggest follow-up nonemergent MRCP with and without intravenous contrast preferably as an outpatient upon resolution of patient's current symptomatology when  they are better able to follow commands including breath hold. 4. Small bilateral pleural effusions with consolidation in the right lower lobe atelectasis versus infiltrate. 5. Trace abdominopelvic free fluid with mesenteric stranding and subcutaneous edema. Electronically Signed   By: Maudry Mayhew M.D.   On: 11/01/2022 14:38   MR 3D Recon At Scanner  Result Date: 11/01/2022 CLINICAL DATA:  Cholelithiasis, choledocholithiasis EXAM: MRI ABDOMEN WITHOUT AND WITH CONTRAST (INCLUDING MRCP) TECHNIQUE: Multiplanar multisequence MR imaging of the abdomen was performed both before and after the administration of intravenous contrast. Heavily T2-weighted images of the biliary and pancreatic ducts were obtained, and three-dimensional MRCP images were rendered by post processing. CONTRAST:  58mL GADAVIST GADOBUTROL 1 MMOL/ML IV SOLN COMPARISON:  Multiple priors including CT October 31, 2022 and May 08, 2016 FINDINGS: Despite efforts by the technologist and patient, motion artifact is present on today's exam and could not be eliminated. This reduces exam sensitivity and specificity. Lower chest: Small bilateral pleural effusions with consolidation in the right lower lobe atelectasis versus infiltrate. Hepatobiliary: No significant hepatic steatosis. No suspicious hepatic lesion. Sludge in a distended gallbladder with pericholecystic fluid. Dilated common duct measures 12 mm with 2 small stones in the mid duct measuring up to 4 mm on image 19/6 and 15/4. Pancreas: Prominence of the pancreatic duct measuring up to 2.5 mm in the pancreatic body on image 14/6, difficult to evaluate given the motion degrading on this examination but appearing similar dating back to 2017. Pancreatic atrophy is similar prior. Spleen:  No splenomegaly. Adrenals/Urinary Tract: Bilateral adrenal glands appear normal. Hydronephrosis. No solid enhancing hepatic lesion. Stomach/Bowel: No pathologic dilation of bowel. Vascular/Lymphatic: Normal caliber  abdominal aorta. No pathologically enlarged abdominal lymph nodes. Other: Trace abdominopelvic free fluid with mesenteric stranding. Body wall edema. Musculoskeletal: Posterior lumbar fixation hardware. IMPRESSION: Motion degraded examination.  Within this context: 1. Dilated common duct measuring 12 mm with 2 small stones in the mid duct measuring up to 4 mm. 2. Sludge in a dilated gallbladder with pericholecystic fluid, equivocal for acute cholecystitis. Consider further evaluation with nuclear medicine HIDA scan. 3. Prominence of the pancreatic duct measuring up to 2.5 mm in the pancreatic body, difficult to evaluate given the motion degrading on this examination but appearing similar dating back to 2017, relative stability is reassuring and depth may be within normal limits for this patient. However, given given that this is incompletely evaluated/characterized on this examination would suggest follow-up nonemergent MRCP with and without intravenous contrast preferably as an outpatient upon resolution of  patient's current symptomatology when they are better able to follow commands including breath hold. 4. Small bilateral pleural effusions with consolidation in the right lower lobe atelectasis versus infiltrate. 5. Trace abdominopelvic free fluid with mesenteric stranding and subcutaneous edema. Electronically Signed   By: Maudry Mayhew M.D.   On: 11/01/2022 14:38   DG Chest Portable 1 View  Result Date: 10/31/2022 CLINICAL DATA:  Central line placement EXAM: PORTABLE CHEST 1 VIEW COMPARISON:  Radiographs 10/03/2022 FINDINGS: New left IJ CVC tip in the low SVC. Remainder unchanged from 10/03/2022. Scarring/atelectasis left lung base. Elevated left hemidiaphragm. Stable cardiomediastinal silhouette. Aortic atherosclerotic calcification. No acute osseous abnormality. No pleural effusion or pneumothorax. IMPRESSION: Left IJ CVC tip in the low SVC. Electronically Signed   By: Minerva Fester M.D.   On:  10/31/2022 21:54   [place] EKG SITE RITE  Result Date: 10/31/2022 If Site Rite image not attached, placement could not be confirmed due to current cardiac rhythm.  CT ABDOMEN PELVIS W CONTRAST  Result Date: 10/31/2022 CLINICAL DATA:  Sepsis. EXAM: CT ABDOMEN AND PELVIS WITH CONTRAST TECHNIQUE: Multidetector CT imaging of the abdomen and pelvis was performed using the standard protocol following bolus administration of intravenous contrast. RADIATION DOSE REDUCTION: This exam was performed according to the departmental dose-optimization program which includes automated exposure control, adjustment of the mA and/or kV according to patient size and/or use of iterative reconstruction technique. CONTRAST:  81mL OMNIPAQUE IOHEXOL 300 MG/ML  SOLN COMPARISON:  Noncontrast CT 09/29/2021 FINDINGS: Lower chest: Dependent atelectasis with trace pleural thickening in the lung bases. Hepatobiliary: Focal fatty infiltration adjacent to the falciform ligament. No evidence of discrete intrahepatic lesion. There is marked intra and extrahepatic biliary ductal dilatation. The common bile duct measures 17 mm. The gallbladder is moderately distended. No calcified gallstone. There is a 4 mm rounded noncalcified density in the distal common bile duct series 2, image 35 suspicious for choledocholithiasis. Pancreas: Pancreatic atrophy. Pancreatic ductal dilatation of 5 mm. There is no obvious obstructing pancreatic mass. Question small pancreatic cyst or side branch lesion series 2, image 30. Spleen: The spleen is enlarged spanning 15 cm cranial caudal. No focal abnormalities. Adrenals/Urinary Tract: Normal adrenal glands. No hydronephrosis or perinephric edema. Homogeneous renal enhancement with symmetric excretion on delayed phase imaging. Small cyst in the upper left kidney. No further follow-up imaging is needed. No visualized renal calculi or solid renal lesion. Urinary bladder is physiologically distended without wall thickening.  Stomach/Bowel: There are foci of free air in the upper abdomen most consistent with perforated viscus, for example series 2, image 22. There is moderate wall thickening about the gastric cardia which is ill-defined. Moderate wall thickening involves the distal gastric body is well. Occasional fluid-filled loops of small bowel in the right abdomen, but no evidence of small-bowel obstruction. There is no small bowel pneumatosis or discrete small bowel inflammation to suggest small bowel as site of perforation. The appendix is tentatively visualized and normal series 6, image 33. Formed stool in the ascending and splenic flexure of the colon. The distal descending and sigmoid colon are nondistended. Equivocal wall thickening versus nondistention. Few sigmoid colonic diverticula, but no focal diverticulitis as cause for perforation. Vascular/Lymphatic: Aortic atherosclerosis without aneurysm. Patent portal, splenic and superior mesenteric veins. No portal venous or mesenteric gas. Limited assessment for adenopathy on the current exam, no obvious enlarged lymph nodes. Reproductive: Retroverted uterus.  No adnexal mass. Other: Small foci of free air in the anterior upper abdomen, series 2, image 21. Small  fat containing supraumbilical ventral abdominal wall hernia. There is no evidence of discrete abdominopelvic collection. Musculoskeletal: Posterior L5-S1 fusion hardware. Grade 1 anterolisthesis of L4 on L5 with prominent facet hypertrophy. There are no acute or suspicious osseous abnormalities. IMPRESSION: 1. Foci of free air in the upper abdomen most consistent with perforated viscus. Exact source is difficult to delineate, however there is wall thickening about both the gastric cardia as well as distal stomach suggesting gastric or upper GI source of perforation. 2. Marked intra and extrahepatic biliary ductal dilatation. There is a 4 mm rounded noncalcified density in the distal common bile duct suspicious for  choledocholithiasis. However there is also pancreatic ductal dilatation and atrophy. Possibility of a CT occult obstructing pancreatic lesion is considered. Recommend further assessment with MRI after resolution of acute event. 3. Equivocal wall thickening versus nondistention of the distal descending and sigmoid colon. Few sigmoid colonic diverticula, but no focal diverticulitis as cause for perforation. 4. Splenomegaly. 5. Small fat containing supraumbilical ventral abdominal wall hernia. Aortic Atherosclerosis (ICD10-I70.0). Critical Value/emergent results were called by telephone at the time of interpretation on 10/31/2022 at 4:07 pm to provider Edilia Bo , who verbally acknowledged these results. Electronically Signed   By: Narda Rutherford M.D.   On: 10/31/2022 16:08    Vassie Loll, MD  [facility]  If 7PM-7AM, please contact night-coverage [email or web address] Password [email or web address] 11/03/2022, 1:11 PM   LOS: 3 days

## 2022-11-03 NOTE — Progress Notes (Addendum)
Pharmacy Antibiotic Note   [name] is a 66 y.o. female admitted on 10/31/2022 with concern of intra-abdominal infection.  Pharmacy has been consulted for cefepime dosing.   Plan: Continuecefepime 2g q8h  Monitor renal function, cultures, and clinical progression   Height: 5\' 6"  (167.6 cm) Weight: 57.5 kg (126 lb 12.2 oz) IBW/kg (Calculated) : 59.3  Temp (24hrs), Avg:98.8 F (37.1 C), Min:98.3 F (36.8 C), Max:99.1 F (37.3 C)  Recent Labs  Lab 10/31/22 1317 10/31/22 1713 11/01/22 0336 11/02/22 0358  WBC 29.7*  --  13.9* 20.8*  CREATININE 0.98  --  0.88 0.60  LATICACIDVEN 2.0* 2.1*  --   --      Estimated Creatinine Clearance: 61.1 mL/min (by C-G formula based on SCr of 0.6 mg/dL).    Allergies  Allergen Reactions   Almond Oil Itching   Amoxicillin-Pot Clavulanate Nausea And Vomiting and Other (See Comments)    Can tolerate plain Amoxicillin (??)   Azithromycin Nausea And Vomiting   Bupropion Nausea And Vomiting   Nsaids Other (See Comments)    Stomach problems   Prednisone Diarrhea   Vilazodone Diarrhea and Other (See Comments)    Viibryd    Antimicrobials this admission: Cefepime 11/6 >>  Metronidazole 11/6 >>    Microbiology results: 11/6 bcx: ngtd MRSA PCR: neg   Thank you for allowing pharmacy to be a part of this patient's care.  13/6, PharmD Clinical Pharmacist 11/03/2022 8:28 AM

## 2022-11-03 NOTE — Progress Notes (Signed)
Pt arrived from [facility] via [facility] to [phone]. Pt alert and oriented, bathed, cleaned and changed gown and replaced external catheter. Noted a 2x2 cm stage 2 sacral pressure injury which was present on admission. Placed xeroforn over 2x2 area and placed a sacral foam.

## 2022-11-03 NOTE — Progress Notes (Signed)
[facility] here to pick up patient for transport, attempted to give report to receiving nurse but was told he/she would return the call, attempted again to contact [facility] [place] but was put on hold and no one picked up the line. Report given to transport, all patient belongings given, patient in stable condition upon departure.

## 2022-11-04 DIAGNOSIS — R198 Other specified symptoms and signs involving the digestive system and abdomen: Secondary | ICD-10-CM

## 2022-11-04 DIAGNOSIS — R569 Unspecified convulsions: Secondary | ICD-10-CM | POA: Diagnosis not present

## 2022-11-04 DIAGNOSIS — K668 Other specified disorders of peritoneum: Secondary | ICD-10-CM | POA: Diagnosis not present

## 2022-11-04 DIAGNOSIS — A419 Sepsis, unspecified organism: Secondary | ICD-10-CM | POA: Diagnosis not present

## 2022-11-04 DIAGNOSIS — K838 Other specified diseases of biliary tract: Secondary | ICD-10-CM | POA: Diagnosis not present

## 2022-11-04 DIAGNOSIS — R109 Unspecified abdominal pain: Secondary | ICD-10-CM

## 2022-11-04 DIAGNOSIS — K659 Peritonitis, unspecified: Secondary | ICD-10-CM | POA: Diagnosis not present

## 2022-11-04 DIAGNOSIS — K8050 Calculus of bile duct without cholangitis or cholecystitis without obstruction: Secondary | ICD-10-CM

## 2022-11-04 DIAGNOSIS — R935 Abnormal findings on diagnostic imaging of other abdominal regions, including retroperitoneum: Secondary | ICD-10-CM

## 2022-11-04 LAB — COMPREHENSIVE METABOLIC PANEL
ALT: 13 U/L (ref 0–44)
AST: 10 U/L — ABNORMAL LOW (ref 15–41)
Albumin: 2.3 g/dL — ABNORMAL LOW (ref 3.5–5.0)
Alkaline Phosphatase: 113 U/L (ref 38–126)
Anion gap: 9 (ref 5–15)
BUN: <5 mg/dL — ABNORMAL LOW (ref 8–23)
CO2: 29 mmol/L (ref 22–32)
Calcium: 8.5 mg/dL — ABNORMAL LOW (ref 8.9–10.3)
Chloride: 100 mmol/L (ref 98–111)
Creatinine, Ser: 0.53 mg/dL (ref 0.44–1.00)
GFR, Estimated: >60 mL/min (ref >60)
Glucose, Bld: 102 mg/dL — ABNORMAL HIGH (ref 70–99)
Potassium: 3.1 mmol/L — ABNORMAL LOW (ref 3.5–5.1)
Sodium: 138 mmol/L (ref 135–145)
Total Bilirubin: 0.2 mg/dL — ABNORMAL LOW (ref 0.3–1.2)
Total Protein: 4.9 g/dL — ABNORMAL LOW (ref 6.5–8.1)

## 2022-11-04 LAB — CBC
HCT: 31.4 % — ABNORMAL LOW (ref 36.0–46.0)
Hemoglobin: 9.5 g/dL — ABNORMAL LOW (ref 12.0–15.0)
MCH: 25.1 pg — ABNORMAL LOW (ref 26.0–34.0)
MCHC: 30.3 g/dL (ref 30.0–36.0)
MCV: 83.1 fL (ref 80.0–100.0)
Platelets: 301 10*3/uL (ref 150–400)
RBC: 3.78 MIL/uL — ABNORMAL LOW (ref 3.87–5.11)
RDW: 15.2 % (ref 11.5–15.5)
WBC: 6.7 10*3/uL (ref 4.0–10.5)
nRBC: 0 % (ref 0.0–0.2)

## 2022-11-04 MED ORDER — ALTEPLASE 2 MG IJ SOLR
2.0000 mg | Freq: Once | INTRAMUSCULAR | Status: AC
  Administered 2022-11-04: 2 mg

## 2022-11-04 MED ORDER — ALTEPLASE 2 MG IJ SOLR
2.0000 mg | Freq: Once | INTRAMUSCULAR | Status: DC
  Filled 2022-11-04: qty 2

## 2022-11-04 NOTE — TOC CM/SW Note (Signed)
Active with [facility] for PT/OT will need resumption of care orders

## 2022-11-04 NOTE — Progress Notes (Signed)
[facility]  PROGRESS NOTE  Jamie [name] BMW:[id number] DOB: [birth year] DOA: 10/31/2022 PCP: Barbie Banner, MD   Brief HPI:   66 year old female with a history of chronic pain syndrome, anxiety, alcohol use in remission, chronic back pain, seizure presenting with worsening upper abdominal pain that began on 10/29/2022.  Notably, the patient was recently discharged from the hospital after a stay from 09/28/2029 to 10/04/2022 where she was treated for sepsis secondary to pneumonia.  The patient required vasopressors for short period of time during the hospitalization.  Her hospital stay was complicated by a seizure for which she was started on Keppra.  The patient was also noted to have symptomatic bradycardia which was thought to be due to her metabolic abnormalities.  This resolved with treatment of her acute medical issues.  The patient was discharged home with home health PT. Apparently, the patient had progressive weakness since 10/29/2022 to the point where she was unable to get off the couch.    In the ED, the patient bradycardic and hypotensive.  She was started on vasopressors.  WBC 29.7. CT of the abdomen and pelvis showed foci of free air in the upper abdomen with moderate thickening of the gastric cardia and distal gastric body.  There was also marked intrahepatic and extrahepatic ductal dilatation with a 4 mm round density in the distal CBD.  There was also pancreatic atrophy and pancreatic duct dilatation.  The patient was started on cefepime and metronidazole and IV fluids.     Subjective   Patient continues to have abdominal pain.   Assessment/Plan:    Septic shock -Secondary to peritonitis and perforated viscus, resolved -Patient has been weaned off vasopressors and has remained stable without them -Lactic acid peaked to 2.1 -Continue cefepime and metronidazole, day #5 -UA was negative for pyuria -Follow blood culture results  Peritonitis -Secondary to microperforation  in her stomach as seen on CT abdomen/pelvis -Patient says that she has been using BC powders on a daily basis for several years due to rheumatoid arthritis -General surgery felt that patient microperforation might have sealed, she was started on clear liquid diet -Discussed with Dr. Dwain Sarna, he recommends making patient n.p.o. and repeating a CT abdomen/pelvis to check for microperforation today  Biliary ductal dilatation -Noted on CT abdomen/pelvis -There was concern for possible choledocholithiasis -MRCP showed distal common bile duct measuring 12 mm with 2 small stones in the mid duct measuring up to 4 mm; sludge in the dilated gallbladder with pericholecystic fluid acute focal for acute cholecystitis -Gastroenterology has been consulted for possible ERCP  Seizure disorder -Patient had unprovoked seizure during her last hospital admission -Currently on Keppra IV  Bradycardia -Seems to have resolved -Continue monitoring on telemetry  Opioid dependence -Patient received oxycodone 10 mg, number 120 tablets on a monthly basis -Started oxycodone in the hospital  Anxiety -Patient exam X 1 mg on daily basis -Continue Xanax 1 mg 3 times daily as needed  Sacral 2 pressure ulcer -Present on admission -Continue local wound care      Medications     (feeding supplement) PROSource Plus  30 mL Oral TID BM   alteplase  2 mg Intracatheter Once   amLODipine  5 mg Oral Daily   Chlorhexidine Gluconate Cloth  6 each Topical Daily   enoxaparin (LOVENOX) injection  40 mg Subcutaneous Q24H   pantoprazole (PROTONIX) IV  40 mg Intravenous Q12H   pneumococcal 20-valent conjugate vaccine  0.5 mL Intramuscular Tomorrow-1000     Data  Reviewed:   CBG:  No results for input(s): "GLUCAP" in the last 168 hours.  SpO2: 98 % O2 Flow Rate (L/min): 3 L/min    Vitals:   11/03/22 2037 11/04/22 0035 11/04/22 0531 11/04/22 0821  BP: (!) 144/72 138/68 (!) 139/91 134/78  Pulse: 84 78 78 79   Resp: 18 15  16   Temp: 97.7 F (36.5 C) 98.9 F (37.2 C) 98.5 F (36.9 C) 98.7 F (37.1 C)  TempSrc: Oral Oral Oral Oral  SpO2: 99% 97% 98% 98%  Weight:      Height:          Data Reviewed:  Basic Metabolic Panel: Recent Labs  Lab 10/31/22 1317 11/01/22 0336 11/02/22 0358 11/04/22 1143  NA 136 135 134* 138  K 3.5 3.8 3.9 3.1*  CL 110 113* 106 100  CO2 14* 18* 21* 29  GLUCOSE 97 90 114* 102*  BUN 23 16 10  <5*  CREATININE 0.98 0.88 0.60 0.53  CALCIUM 8.7* 8.3* 9.0 8.5*  MG  --  2.1 1.9  --   PHOS  --  3.0  --   --     CBC: Recent Labs  Lab 10/31/22 1317 11/01/22 0336 11/02/22 0358 11/04/22 1143  WBC 29.7* 13.9* 20.8* 6.7  NEUTROABS 27.7*  --   --   --   HGB 11.4* 9.7* 10.7* 9.5*  HCT 37.3 31.2* 34.7* 31.4*  MCV 83.3 82.5 81.1 83.1  PLT 461* 453* 533* 301    LFT Recent Labs  Lab 10/31/22 1317 11/01/22 0336 11/02/22 0358 11/04/22 1143  AST 187* 53* 21 10*  ALT 86* 52* 33 13  ALKPHOS 263* 203* 193* 113  BILITOT 0.5 0.5 0.4 0.2*  PROT 7.1 6.3* 6.5 4.9*  ALBUMIN 3.7 3.2* 3.1* 2.3*     Antibiotics: Anti-infectives (From admission, onward)    Start     Dose/Rate Route Frequency Ordered Stop   11/02/22 1000  ceFEPIme (MAXIPIME) 2 g in sodium chloride 0.9 % 100 mL IVPB        2 g 200 mL/hr over 30 Minutes Intravenous Every 8 hours 11/02/22 0754     11/01/22 0600  metroNIDAZOLE (FLAGYL) IVPB 500 mg        500 mg 100 mL/hr over 60 Minutes Intravenous Every 12 hours 10/31/22 1747     10/31/22 2200  ceFEPIme (MAXIPIME) 2 g in sodium chloride 0.9 % 100 mL IVPB  Status:  Discontinued        2 g 200 mL/hr over 30 Minutes Intravenous Every 12 hours 10/31/22 1757 11/02/22 0754   10/31/22 1600  metroNIDAZOLE (FLAGYL) IVPB 500 mg        500 mg 100 mL/hr over 60 Minutes Intravenous  Once 10/31/22 1546 10/31/22 1759   10/31/22 1445  ceFEPIme (MAXIPIME) 2 g in sodium chloride 0.9 % 100 mL IVPB        2 g 200 mL/hr over 30 Minutes Intravenous  Once 10/31/22  1440 10/31/22 1643        DVT prophylaxis: Lovenox  Code Status: Full code  Family Communication:    CONSULTS    Objective    Physical Examination:   General: Appears in mild distress Cardiovascular: S1-S2, regular, no murmur auscultated Respiratory: Clear to auscultation bilaterally Abdomen: Abdomen is soft, tender to palpation in epigastric region, no organomegaly Extremities: No edema in the lower extremities Neurologic: Alert, oriented x3, intact insight and judgment, no focal deficit noted   Status is: Inpatient:  Pressure Injury 10/31/22 Sacrum Medial Stage 2 -  Partial thickness loss of dermis presenting as a shallow open injury with a red, pink wound bed without slough. (Active)  10/31/22 2322  Location: Sacrum  Location Orientation: Medial  Staging: Stage 2 -  Partial thickness loss of dermis presenting as a shallow open injury with a red, pink wound bed without slough.  Wound Description (Comments):   Present on Admission: Yes        Bhavin Monjaraz S Andreka Stucki   [facility] If 7PM-7AM, please contact night-coverage at [email or web address], Office  [phone]   11/04/2022, 4:10 PM  LOS: 4 days

## 2022-11-04 NOTE — Consult Note (Signed)
Reason for Consult:ab pain, possible perforated viscus Referring Physician: Dr Dorethea Clan is an 66 y.o. female.  HPI: 20 yof who has mmp who presented to an outside facility on 11/6 with abdominal pain for over 24 hours.  This was more acute onset and upper abdominal.  Denies n/v.  No real nsaid use of any significance.  She is having stools. She was admitted to outside facility after undergoing a ct scan that showed foci of free air present in upper abdomen c/w perforated viscus.  There was thickening of the gastric cardia and distal stomach.  There was also biliary ductal dilatation with a 4 mm possible stone in distal cbd.  She was on pressors and got better. She was on abx.  She was started on a liquid diet there.  Some of lfts up a little but tb normal.  She was then sent for an mrcp that showed a 12 mm cbd with 2 small stones (mostly normal lfts today) as well as sludge in dilated gb.  There is prominent panc duct.  There is also some free fluid with mesenteric stranding.  She was transferred here for an ercp.  I have not received any communication from other facility regarding her care.   Past Medical History:  Diagnosis Date   Anxiety    Arthritis    Chronic pain    Gastroenteritis    Headache    Hypotension    Insomnia    Suicidal ideation     Past Surgical History:  Procedure Laterality Date   BACK SURGERY     Lumbar Fusion   CARPAL TUNNEL RELEASE     LAPAROSCOPIC OOPHERECTOMY     MOUTH SURGERY      Family History  Problem Relation Age of Onset   Colon cancer Neg Hx     Social History:  reports that she has never smoked. She has never used smokeless tobacco. She reports that she does not currently use alcohol. She reports that she does not use drugs.  Allergies:  Allergies  Allergen Reactions   Almond Oil Itching   Amoxicillin-Pot Clavulanate Nausea And Vomiting and Other (See Comments)    Can tolerate plain Amoxicillin (??)   Azithromycin Nausea And Vomiting    Bupropion Nausea And Vomiting   Nsaids Other (See Comments)    Stomach problems   Prednisone Diarrhea   Vilazodone Diarrhea and Other (See Comments)    Viibryd    Current Facility-Administered Medications  Medication Dose Route Frequency Provider Last Rate Last Admin   (feeding supplement) PROSource Plus liquid 30 mL  30 mL Oral TID BM Franky Macho, MD   30 mL at 11/04/22 1137   0.9 %  sodium chloride infusion  250 mL Intravenous Continuous Franky Macho, MD       acetaminophen (TYLENOL) tablet 650 mg  650 mg Oral Q6H PRN Franky Macho, MD   650 mg at 11/04/22 1356   Or   acetaminophen (TYLENOL) suppository 650 mg  650 mg Rectal Q6H PRN Franky Macho, MD       ALPRAZolam Prudy Feeler) tablet 1 mg  1 mg Oral TID PRN Catarina Hartshorn, MD   1 mg at 11/04/22 0743   alteplase (CATHFLO ACTIVASE) injection 2 mg  2 mg Intracatheter Once Franky Macho, MD       alteplase (CATHFLO ACTIVASE) injection 2 mg  2 mg Intracatheter Once Franky Macho, MD       amLODipine (NORVASC) tablet 5 mg  5 mg  Oral Daily Tat, David, MD   5 mg at 11/04/22 1136   ceFEPIme (MAXIPIME) 2 g in sodium chloride 0.9 % 100 mL IVPB  2 g Intravenous Myriam Jacobson, MD 200 mL/hr at 11/04/22 1222 Infusion Verify at 11/04/22 1222   Chlorhexidine Gluconate Cloth 2 % PADS 6 each  6 each Topical Daily Franky Macho, MD   6 each at 11/03/22 0943   enoxaparin (LOVENOX) injection 40 mg  40 mg Subcutaneous Q24H Franky Macho, MD   40 mg at 11/03/22 1828   hydrALAZINE (APRESOLINE) injection 10 mg  10 mg Intravenous Q6H PRN Catarina Hartshorn, MD   10 mg at 11/01/22 2127   HYDROmorphone (DILAUDID) injection 1 mg  1 mg Intravenous Q2H PRN Franky Macho, MD   1 mg at 11/04/22 1137   lactated ringers infusion   Intravenous Continuous Franky Macho, MD   Stopped at 11/04/22 1155   levETIRAcetam (KEPPRA) IVPB 500 mg/100 mL premix  500 mg Intravenous Pablo Ledger, MD   Stopped at 11/04/22 1209   metroNIDAZOLE (FLAGYL) IVPB 500 mg  500 mg Intravenous Q12H  Franky Macho, MD   Stopped at 11/04/22 0613   ondansetron (ZOFRAN-ODT) disintegrating tablet 4 mg  4 mg Oral Q6H PRN Franky Macho, MD   4 mg at 11/04/22 1136   Or   ondansetron (ZOFRAN) injection 4 mg  4 mg Intravenous Q6H PRN Franky Macho, MD   4 mg at 11/03/22 1657   pantoprazole (PROTONIX) injection 40 mg  40 mg Intravenous Pablo Ledger, MD   40 mg at 11/04/22 1136   pneumococcal 20-valent conjugate vaccine (PREVNAR 20) injection 0.5 mL  0.5 mL Intramuscular Tomorrow-1000 Adefeso, Olad[place], DO         Results for orders placed or performed during the hospital encounter of 10/31/22 (from the past 48 hour(s))  CBC     Status: Abnormal   Collection Time: 11/04/22 11:43 AM  Result Value Ref Range   WBC 6.7 4.0 - 10.5 K/uL   RBC 3.78 (L) 3.87 - 5.11 MIL/uL   Hemoglobin 9.5 (L) 12.0 - 15.0 g/dL   HCT 95.1 (L) 88.4 - 16.6 %   MCV 83.1 80.0 - 100.0 fL   MCH 25.1 (L) 26.0 - 34.0 pg   MCHC 30.3 30.0 - 36.0 g/dL   RDW 06.3 01.6 - 01.0 %   Platelets 301 150 - 400 K/uL   nRBC 0.0 0.0 - 0.2 %    Comment: Performed at [facility], [address], [place], [place] [address]  Comprehensive metabolic panel     Status: Abnormal   Collection Time: 11/04/22 11:43 AM  Result Value Ref Range   Sodium 138 135 - 145 mmol/L   Potassium 3.1 (L) 3.5 - 5.1 mmol/L   Chloride 100 98 - 111 mmol/L   CO2 29 22 - 32 mmol/L   Glucose, Bld 102 (H) 70 - 99 mg/dL    Comment: Glucose reference range applies only to samples taken after fasting for at least 8 hours.   BUN <5 (L) 8 - 23 mg/dL   Creatinine, Ser 5.73 0.44 - 1.00 mg/dL   Calcium 8.5 (L) 8.9 - 10.3 mg/dL   Total Protein 4.9 (L) 6.5 - 8.1 g/dL   Albumin 2.3 (L) 3.5 - 5.0 g/dL   AST 10 (L) 15 - 41 U/L   ALT 13 0 - 44 U/L   Alkaline Phosphatase 113 38 - 126 U/L   Total Bilirubin 0.2 (L) 0.3 -  1.2 mg/dL   GFR, Estimated >02 >63 mL/min    Comment: (NOTE) Calculated using the CKD-EPI Creatinine Equation (2021)    Anion gap 9 5 - 15     Comment: Performed at [facility], [address] [place], [place] [address]    No results found.  Review of Systems  Constitutional:  Negative for fever.  Gastrointestinal:  Positive for abdominal distention and abdominal pain. Negative for nausea and vomiting.  All other systems reviewed and are negative.  Blood pressure 134/78, pulse 79, temperature 98.7 F (37.1 C), temperature source Oral, resp. rate 16, height 5\' 6"  (1.676 m), weight 57.5 kg, SpO2 98 %. Physical Exam Constitutional:      Appearance: She is ill-appearing.  HENT:     Head: Normocephalic and atraumatic.  Eyes:     Comments: No scleral icterus   Cardiovascular:     Rate and Rhythm: Normal rate and regular rhythm.  Pulmonary:     Effort: Pulmonary effort is normal.  Abdominal:     Palpations: Abdomen is soft.     Tenderness: There is abdominal tenderness.     Comments: Small infraumbo incision, tender epigastrium with local peritoneal signs  Skin:    Capillary Refill: Capillary refill takes less than 2 seconds.     Coloration: Skin is pale.  Neurological:     General: No focal deficit present.     Mental Status: She is alert.  Psychiatric:        Mood and Affect: Mood is anxious.     Assessment/Plan: Possible gastric perforation Choledocholithiasis -she is still quite tender and not really delineated what is causing her current symptoms.  Her sepsis was not really related to the cbd stones as didn't really ever meet any criteria for cholangitis.  I doubt cholecystitis would make her that ill. A gastric perforation is certainly leading source of this current issue.  This was treated conservatively and may have resolved although she is still tender and this has not really been followed up with a contrast study. I would make her npo and repeat a ct scan (ugi possible also- but ct will be quicker and give an answer with contrast I think). -I don't think her symptoms are related to the stones in cbd  and would not do an ercp for now until this is figured out further. Likely did not need to be transferred here for this at this point given her history. -pharm dvt prophylaxis is fine -recommend PPI -will follow up after CT scan done with further recommendations.  -eventually will need GI evaluation but don't think this is urgent  US 11/04/2022, 1:57 PM

## 2022-11-04 NOTE — Consult Note (Addendum)
Consultation  Referring Provider:   [facility] Primary Care Physician:  Jamie Sacramento, MD Primary Gastroenterologist:  Althia Forts       Reason for Consultation:   4 mm CBD stone, dilated pancreatic duct, small amount of free air above liver and antral stomach history of BC powder use presented with acute AB pain.   Impression    Septic shock secondary to peritonitis  Off pressors and stable at this time. On cefepime and metronidazole Blood cultures negative day 4 WBC 6.7 at this time  Likely gastric or peptic ulcer perforation  secondary to [facility] powder use daily and ETOH 3-6 vodka drinks x 10 years   Acute on chronic blood loss anemia Hemoglobin currently 9.5 ( 11.9) Likely secondary to peptic ulcer perforation in setting of BC powder use On PPI twice daily IV, never had PPI infusion BM 11/03/2022 hard stools, brown, no evidence of melena or hematochezia.   Choledocholithiasis without LFT elevation, no cholangitis AST 21 ALT 33  Alkphos 193 TBili 0.4 10/03/2022 INR 1.0  10/31/2022 Lactic acid 2.1 Noted on CT, MRCP demonstrated 2 small stones mid duct measuring up to 4 mm, common bile duct measures 12 mm, sludge in dilated gallbladder with pericholecystic fluid, equivocal for acute cholecystitis. Likely will need ERCP with laparoscopic cholecystectomy  Pancreatic ductal dilatation with pancreatic atrophy 11/01/2022 MRCP prominent pancreatic duct up to 2.5 mm, Similar appearance dating back to 2017 alcohol disorder history, still drink ETOH per patient 3-6 vodka drinks a week for last 10 years.  If any diarrhea or loose stools, consider Creon Suggest repeat MRCP with and without outpatient non emergent for evaluation  Diastolic heart failure [id number] grade 2 diastolic mildly elevated pulmonary artery systolic pressure normal valves.  Seizure disorder On Keppra  Opioid dependence Oxycodone  Constipation secondary to opioid dependence    Principal Problem:    Peritonitis ([place]) Active Problems:   Sepsis due to undetermined organism ([place])   Opiate dependence --Chronic pain   Seizure ([facility])   Sinus bradycardia   Pressure injury of skin    LOS: 4 days     Plan   66 year old female with history of diastolic heart failure, bradycardia, moderate malnutrition, chronic pain syndrome/rheumatoid arthritis on BC powders daily, vodka 3-6 drinks weekly for 10 years presented with acute abdominal pain 11/6, found to have free air in the abdomen, also found to have CBD 12 mm, sludge in dilated GB Without LFT elevation, and talking with the patient further I believe the choledocholithiasis is more chronic rather than acute, while the abdominal pain from this admission is from peptic ulcer disease status post perforation secondary to BC/EtOH.  - Daily CBC, CMET -Continue supportive care -Continue pain control. -Continue IV hydration, can do clear liquids today, NPO day of procedure -Will need to hold VTE for at least 6 hours prior to procedure, just on lovenox 40 mg q 24 --Protonix 40 mg IV BID, consider putting on infusion if worsening Ab pain - add on carafate 1 gram QID - Add miralax BID for constipation --Continue to monitor H&H with transfusion as needed to maintain hemoglobin greater than 7. -Appreciate surgical consult -Patient have some rebound tenderness on exam, agree with repeating CT AB and pelvis. No fever, WBC normal, patient with chronic pain.  - If CT negative consider UGI, defer EGD secondary to recent perforation  -Potential ERCP , timing to be discussed with Dr. Henrene Pastor, does not need to be acute.   -I  thoroughly discussed procedure of  ERCP with the patient to include nature, alternatives, benefits, and risks (including but not limited to post ERCP pancreatitis, bleeding, infection, perforation, anesthesia/cardiac pulmonary complications). Discussed risk of pancreatitis associated with ERCP. Patient verbalized understanding and gave verbal  consent to proceed with ERCP.   Thank you for your kind consultation, we will continue to follow.         HPI:   Jamie [name] is a 66 y.o. female with past medical history significant for bradycardia, diastolic heart failure, anxiety/depression, moderate malnutrition, chronic pain syndrome and rheumatoid arthritis which she takes BC powders daily for the past 3 years presented to ER 10/31/2022 with acute onset abdominal pain.  October 2023 and discharged from [facility] for abscess secondary to pneumonia, hypotension, bradycardia and diastolic heart failure.  Had seizures during the hospital stay and started on Keppra. Patient progressive weakness 11/4//2023. Patient states pain with left upper quadrant radiation to right upper quadrant. Denies nausea or vomiting, melena, hematemesis.  No hematochezia. Denied diarrhea.  Denied chest pain but has chronic shortness of breath.  Denies fever or chills.  Patient states she has had abdominal discomfort worse with fatty and greasy foods, possible colicky pain for several years, states in her 30s she had hepatitis B and did have yellowing of skin denies at that time has never had since that time. No family history of gallbladder issues.  No family history of colon cancer, liver cancer or stomach cancer. Patient is taking BC powders daily for at least 3 years for chronic pain and rheumatoid arthritis. On Protonix 40 mg once daily. Patient had remote EGD colonoscopy, does not recall where greater than 10 years ago.  Patient states since being here she has had continuous abdominal pain since admission, has not improved, some nausea but no vomiting.   States last bowel movement was yesterday brown hard, patient is known constipation secondary to opioid use.   Normally has bowel movement once a week.  Labs and imaging: CT abdomen pelvis in the ER showed foci of free air in the upper abdomen most consistent with perforated viscus, wall thickening about  both the gastric cardia as well as distal stomach suggesting gastric or upper GI source of perforation.  Also showed marked intra and extrahepatic biliary ductal dilatation with 4 mm rounded noncalcified distal common bile duct density suspicious for choledocholithiasis, pancreatic atrophy and pancreatic ductal dilatation. Urine drug screen positive for amphetamines and barbiturates, negative for alcohol. MRCP CBD 12 mm 2 small stones mid duct 4 mm.  Sludge dilated gallbladder.  Cholecystic fluid equivocal acute cholecystitis, consider HIDA.  Prominent pancreatic duct 2.5 mm, similar to 2017, suggest outpatient nonemergent MRCP with and without IV contrast to evaluate further due to poor motion artifact. WBC 29.7, hemoglobin 11.4, platelets 461.  Sodium 135, potassium 3.8, serum creatinine 0.88. Patient was placed on cefepime and metronidazole.    Past Medical History:  Diagnosis Date   Anxiety    Arthritis    Chronic pain    Gastroenteritis    Headache    Hypotension    Insomnia    Suicidal ideation     Surgical History:  She  has a past surgical history that includes Laparoscopic oopherectomy; Back surgery; Carpal tunnel release; and Mouth surgery. Family History:  Her family history is not on file. Social History:   reports that she has never smoked. She has never used smokeless tobacco. She reports that she does not currently use alcohol. She reports  that she does not use drugs.  Prior to Admission medications   Medication Sig Start Date End Date Taking? Authorizing Provider  levETIRAcetam (KEPPRA) 500 MG tablet Take 1 tablet (500 mg total) by mouth 2 (two) times daily. 10/04/22 12/03/22 Yes Zigmund Daniel., MD  loratadine (CLARITIN) 10 MG tablet Take 1 tablet (10 mg total) by mouth daily. Patient taking differently: Take 10 mg by mouth daily as needed for allergies, rhinitis or itching. 07/31/22  Yes Vassie Loll, MD  pantoprazole (PROTONIX) 40 MG tablet Take 1 tablet (40  mg total) by mouth daily. Patient taking differently: Take 40 mg by mouth daily as needed (for reflux). 07/30/22  Yes Vassie Loll, MD  tiZANidine (ZANAFLEX) 4 MG tablet Take 4 mg by mouth 3 (three) times daily.   Yes [provider]  traZODone (DESYREL) 100 MG tablet Take 1 tablet (100 mg total) by mouth at bedtime as needed for sleep. 07/30/22  Yes Vassie Loll, MD  acetaminophen (TYLENOL) 500 MG tablet Take 2 tablets (1,000 mg total) by mouth every 8 (eight) hours as needed for mild pain or headache (or Fever >/= 101). Patient not taking: Reported on 09/29/2022 07/30/22   Vassie Loll, MD  furosemide (LASIX) 40 MG tablet Take 1 tablet (40 mg total) by mouth daily for 7 days. Follow repeat labs with your PCP within a week and discuss whether you should continue this medication. 10/05/22 10/12/22  Zigmund Daniel., MD  potassium chloride (MICRO-K) 10 MEQ CR capsule Take 10 mEq by mouth 2 (two) times daily. Patient not taking: Reported on 07/27/2022 07/17/22 07/30/22  [provider]    Current Facility-Administered Medications  Medication Dose Route Frequency Provider Last Rate Last Admin   (feeding supplement) PROSource Plus liquid 30 mL  30 mL Oral TID BM Franky Macho, MD   30 mL at 11/04/22 1137   0.9 %  sodium chloride infusion  250 mL Intravenous Continuous Franky Macho, MD       acetaminophen (TYLENOL) tablet 650 mg  650 mg Oral Q6H PRN Franky Macho, MD   650 mg at 11/03/22 1656   Or   acetaminophen (TYLENOL) suppository 650 mg  650 mg Rectal Q6H PRN Franky Macho, MD       ALPRAZolam Prudy Feeler) tablet 1 mg  1 mg Oral TID PRN Catarina Hartshorn, MD   1 mg at 11/04/22 0743   alteplase (CATHFLO ACTIVASE) injection 2 mg  2 mg Intracatheter Once Franky Macho, MD       alteplase (CATHFLO ACTIVASE) injection 2 mg  2 mg Intracatheter Once Franky Macho, MD       amLODipine (NORVASC) tablet 5 mg  5 mg Oral Daily Tat, David, MD   5 mg at 11/04/22 1136   ceFEPIme (MAXIPIME) 2 g in  sodium chloride 0.9 % 100 mL IVPB  2 g Intravenous Q8H Vassie Loll, MD 200 mL/hr at 11/04/22 1155 2 g at 11/04/22 1155   Chlorhexidine Gluconate Cloth 2 % PADS 6 each  6 each Topical Daily Franky Macho, MD   6 each at 11/03/22 0943   enoxaparin (LOVENOX) injection 40 mg  40 mg Subcutaneous Q24H Franky Macho, MD   40 mg at 11/03/22 1828   hydrALAZINE (APRESOLINE) injection 10 mg  10 mg Intravenous Q6H PRN Catarina Hartshorn, MD   10 mg at 11/01/22 2127   HYDROmorphone (DILAUDID) injection 1 mg  1 mg Intravenous Q2H PRN Franky Macho, MD   1 mg at 11/04/22 1137  lactated ringers infusion   Intravenous Continuous Aviva Signs, MD 100 mL/hr at 11/04/22 0746 New Bag at 11/04/22 0746   levETIRAcetam (KEPPRA) IVPB 500 mg/100 mL premix  500 mg Intravenous Therisa Doyne, MD 400 mL/hr at 11/04/22 1148 500 mg at 11/04/22 1148   metroNIDAZOLE (FLAGYL) IVPB 500 mg  500 mg Intravenous Q12H Aviva Signs, MD 100 mL/hr at 11/04/22 0512 500 mg at 11/04/22 0512   ondansetron (ZOFRAN-ODT) disintegrating tablet 4 mg  4 mg Oral Q6H PRN Aviva Signs, MD   4 mg at 11/04/22 1136   Or   ondansetron (ZOFRAN) injection 4 mg  4 mg Intravenous Q6H PRN Aviva Signs, MD   4 mg at 11/03/22 1657   pantoprazole (PROTONIX) injection 40 mg  40 mg Intravenous Therisa Doyne, MD   40 mg at 11/04/22 1136   pneumococcal 20-valent conjugate vaccine (PREVNAR 20) injection 0.5 mL  0.5 mL Intramuscular Tomorrow-1000 Adefeso, Oladapo, DO        Allergies as of 10/31/2022 - Review Complete 10/31/2022  Allergen Reaction Noted   Almond oil Itching 09/29/2022   Amoxicillin-pot clavulanate Nausea And Vomiting and Other (See Comments) 05/26/2016   Azithromycin Nausea And Vomiting 05/08/2016   Bupropion Nausea And Vomiting 05/26/2016   Nsaids Other (See Comments) 05/08/2016   Prednisone Diarrhea 05/26/2016   Vilazodone Diarrhea and Other (See Comments) 05/26/2016    Review of Systems:    Constitutional: No weight loss, fever, chills,  weakness or fatigue HEENT: Eyes: No change in vision               Ears, Nose, Throat:  No change in hearing or congestion Skin: No rash or itching Cardiovascular: No chest pain, chest pressure or palpitations   Respiratory: No SOB or cough Gastrointestinal: See HPI and otherwise negative Genitourinary: No dysuria or change in urinary frequency Neurological: No headache, dizziness or syncope Musculoskeletal: No new muscle or joint pain Hematologic: No bleeding or bruising Psychiatric: No history of depression or anxiety     Physical Exam:  Vital signs in last 24 hours: Temp:  [97.7 F (36.5 C)-98.9 F (37.2 C)] 98.7 F (37.1 C) (11/10 0821) Pulse Rate:  [78-84] 79 (11/10 0821) Resp:  [15-18] 16 (11/10 0821) BP: (134-151)/(68-91) 134/78 (11/10 0821) SpO2:  [97 %-99 %] 98 % (11/10 0821)   Last BM recorded by nurses in past 5 days Stool Type: Type 1 (Separate hard lumps) (11/03/2022  9:10 PM)  General:   Chronically appearing obese female with poor dentition, in no acute distress appears uncomfortable Head:  Normocephalic and atraumatic. Eyes: sclerae anicteric,conjunctive pink  Heart:  regular rate and rhythm, occasional PVC Pulm: Clear anteriorly; no wheezing Abdomen:  Soft, Obese AB, Sluggish bowel sounds. moderate tenderness in the entire abdomen. With guarding and With rebound, No organomegaly appreciated. Extremities:  With edema. Msk:  Symmetrical without gross deformities. Peripheral pulses intact.  Neurologic:  Alert and  oriented x4;  No focal deficits.  Skin:   Dry and intact without significant lesions or rashes. Psychiatric:  Cooperative. Normal mood and affect.  LAB RESULTS: Recent Labs    11/02/22 0358 11/04/22 1143  WBC 20.8* 6.7  HGB 10.7* 9.5*  HCT 34.7* 31.4*  PLT 533* 301   BMET Recent Labs    11/02/22 0358  NA 134*  K 3.9  CL 106  CO2 21*  GLUCOSE 114*  BUN 10  CREATININE 0.60  CALCIUM 9.0   LFT Recent Labs    11/02/22 0358  PROT  6.5   ALBUMIN 3.1*  AST 21  ALT 33  ALKPHOS 193*  BILITOT 0.4   PT/INR No results for input(s): "LABPROT", "INR" in the last 72 hours.  STUDIES: No results found.   Vladimir Crofts  11/04/2022, 12:17 PM  GI ATTENDING  History, laboratories, x-rays reviewed.  Patient personally seen and examined.  Agree with comprehensive consultation note as outlined above.  Also, reviewed Dr. Cristal Generous (general surgery) note and agree with his assessment and plan. The patient's principal problem seems to be acute abdominal pain secondary to perforated viscus.  Based on findings of original CT scan, the most likely source is gastric ulcer.  Risk factors chronic NSAIDs.  Still with moderate pain in the abdomen on exam.  Agree with keeping her n.p.o. and proceeding with CT scan.  Also agree that if CT scan is negative for free air, then a good upper GI series may be helpful.  We would be reluctant to perform endoscopy for obvious reasons. As for her dilated bile duct with probable small choledocholithiasis, I do not think this is in any way responsible for her acute presentation.  May or may not have to do with intermittent pain that she has had in the past.  It is difficult to assess given her history of chronic pain syndrome.  In any event, no immediate plans for ERCP.  GI will follow.  Dr. Lyndel Safe will be covering for our group this weekend.  Thanks  Docia Chuck. Geri Seminole., M.D. [facility] Division of Gastroenterology

## 2022-11-04 NOTE — Care Management Important Message (Signed)
Important Message  Patient Details  Name: Jamie [name] MRN: [id number] Date of Birth: [birth year]   Medicare Important Message Given:  Yes     Dorena Bodo 11/04/2022, 4:35 PM

## 2022-11-05 ENCOUNTER — Inpatient Hospital Stay (HOSPITAL_COMMUNITY): Payer: Medicare HMO

## 2022-11-05 DIAGNOSIS — R569 Unspecified convulsions: Secondary | ICD-10-CM | POA: Diagnosis not present

## 2022-11-05 DIAGNOSIS — K659 Peritonitis, unspecified: Secondary | ICD-10-CM | POA: Diagnosis not present

## 2022-11-05 DIAGNOSIS — A419 Sepsis, unspecified organism: Secondary | ICD-10-CM | POA: Diagnosis not present

## 2022-11-05 LAB — CBC WITH DIFFERENTIAL/PLATELET
Abs Immature Granulocytes: 0.05 10*3/uL (ref 0.00–0.07)
Basophils Absolute: 0.1 10*3/uL (ref 0.0–0.1)
Basophils Relative: 1 %
Eosinophils Absolute: 0.7 10*3/uL — ABNORMAL HIGH (ref 0.0–0.5)
Eosinophils Relative: 9 %
HCT: 30.6 % — ABNORMAL LOW (ref 36.0–46.0)
Hemoglobin: 9.4 g/dL — ABNORMAL LOW (ref 12.0–15.0)
Immature Granulocytes: 1 %
Lymphocytes Relative: 22 %
Lymphs Abs: 1.7 10*3/uL (ref 0.7–4.0)
MCH: 25.2 pg — ABNORMAL LOW (ref 26.0–34.0)
MCHC: 30.7 g/dL (ref 30.0–36.0)
MCV: 82 fL (ref 80.0–100.0)
Monocytes Absolute: 0.7 10*3/uL (ref 0.1–1.0)
Monocytes Relative: 9 %
Neutro Abs: 4.6 10*3/uL (ref 1.7–7.7)
Neutrophils Relative %: 58 %
Platelets: 309 10*3/uL (ref 150–400)
RBC: 3.73 MIL/uL — ABNORMAL LOW (ref 3.87–5.11)
RDW: 14.9 % (ref 11.5–15.5)
WBC: 7.9 10*3/uL (ref 4.0–10.5)
nRBC: 0 % (ref 0.0–0.2)

## 2022-11-05 LAB — CULTURE, BLOOD (ROUTINE X 2)
Culture: NO GROWTH
Culture: NO GROWTH
Special Requests: ADEQUATE

## 2022-11-05 LAB — COMPREHENSIVE METABOLIC PANEL
ALT: 9 U/L (ref 0–44)
AST: 9 U/L — ABNORMAL LOW (ref 15–41)
Albumin: 2.4 g/dL — ABNORMAL LOW (ref 3.5–5.0)
Alkaline Phosphatase: 112 U/L (ref 38–126)
Anion gap: 5 (ref 5–15)
BUN: <5 mg/dL — ABNORMAL LOW (ref 8–23)
CO2: 31 mmol/L (ref 22–32)
Calcium: 8.5 mg/dL — ABNORMAL LOW (ref 8.9–10.3)
Chloride: 100 mmol/L (ref 98–111)
Creatinine, Ser: 0.51 mg/dL (ref 0.44–1.00)
GFR, Estimated: >60 mL/min (ref >60)
Glucose, Bld: 92 mg/dL (ref 70–99)
Potassium: 3.4 mmol/L — ABNORMAL LOW (ref 3.5–5.1)
Sodium: 136 mmol/L (ref 135–145)
Total Bilirubin: 0.2 mg/dL — ABNORMAL LOW (ref 0.3–1.2)
Total Protein: 5.1 g/dL — ABNORMAL LOW (ref 6.5–8.1)

## 2022-11-05 MED ORDER — POTASSIUM CHLORIDE 10 MEQ/100ML IV SOLN
10.0000 meq | INTRAVENOUS | Status: AC
  Administered 2022-11-05 (×3): 10 meq via INTRAVENOUS
  Filled 2022-11-05 (×3): qty 100

## 2022-11-05 MED ORDER — IOHEXOL 350 MG/ML SOLN
75.0000 mL | Freq: Once | INTRAVENOUS | Status: AC | PRN
  Administered 2022-11-05: 75 mL via INTRAVENOUS

## 2022-11-05 MED ORDER — IOHEXOL 9 MG/ML PO SOLN
ORAL | Status: AC
  Administered 2022-11-05: 500 mL
  Filled 2022-11-05: qty 1000

## 2022-11-05 NOTE — Progress Notes (Signed)
Subjective/Chief Complaint: Complains of abd pain and not getting food   Objective: Vital signs in last 24 hours: Temp:  [97.7 F (36.5 C)-99 F (37.2 C)] 98.7 F (37.1 C) (11/11 0755) Pulse Rate:  [73-84] 73 (11/11 0755) Resp:  [15-18] 15 (11/11 0755) BP: (117-148)/(75-85) 148/78 (11/11 0755) SpO2:  [86 %-95 %] 91 % (11/11 0755)    Intake/Output from previous day: 11/10 0701 - 11/11 0700 In: 1332.6 [P.O.:240; I.V.:918.8; IV Piggyback:173.8] Out: 2600 [Urine:2600] Intake/Output this shift: No intake/output data recorded.  General appearance: alert and cooperative Resp: clear to auscultation bilaterally Cardio: regular rate and rhythm GI: soft, moderate tenderness but no guarding or peritonitis. Good bs  Lab Results:  Recent Labs    11/04/22 1143  WBC 6.7  HGB 9.5*  HCT 31.4*  PLT 301   BMET Recent Labs    11/04/22 1143  NA 138  K 3.1*  CL 100  CO2 29  GLUCOSE 102*  BUN <5*  CREATININE 0.53  CALCIUM 8.5*   PT/INR No results for input(s): "LABPROT", "INR" in the last 72 hours. ABG No results for input(s): "PHART", "HCO3" in the last 72 hours.  Invalid input(s): "PCO2", "PO2"  Studies/Results: No results found.  Anti-infectives: Anti-infectives (From admission, onward)    Start     Dose/Rate Route Frequency Ordered Stop   11/02/22 1000  ceFEPIme (MAXIPIME) 2 g in sodium chloride 0.9 % 100 mL IVPB        2 g 200 mL/hr over 30 Minutes Intravenous Every 8 hours 11/02/22 0754     11/01/22 0600  metroNIDAZOLE (FLAGYL) IVPB 500 mg        500 mg 100 mL/hr over 60 Minutes Intravenous Every 12 hours 10/31/22 1747     10/31/22 2200  ceFEPIme (MAXIPIME) 2 g in sodium chloride 0.9 % 100 mL IVPB  Status:  Discontinued        2 g 200 mL/hr over 30 Minutes Intravenous Every 12 hours 10/31/22 1757 11/02/22 0754   10/31/22 1600  metroNIDAZOLE (FLAGYL) IVPB 500 mg        500 mg 100 mL/hr over 60 Minutes Intravenous  Once 10/31/22 1546 10/31/22 1759    10/31/22 1445  ceFEPIme (MAXIPIME) 2 g in sodium chloride 0.9 % 100 mL IVPB        2 g 200 mL/hr over 30 Minutes Intravenous  Once 10/31/22 1440 10/31/22 1643       Assessment/Plan: s/p * No surgery found * Still waiting on CT Possible gastric perforation Choledocholithiasis -she is still quite tender and not really delineated what is causing her current symptoms.  Her sepsis was not really related to the cbd stones as didn't really ever meet any criteria for cholangitis.  I doubt cholecystitis would make her that ill. A gastric perforation is certainly leading source of this current issue.  This was treated conservatively and may have resolved although she is still tender and this has not really been followed up with a contrast study. I would make her npo and repeat a ct scan (ugi possible also- but ct will be quicker and give [place] an answer with contrast I think). -I don't think her symptoms are related to the stones in cbd and would not do an ercp for now until this is figured out further. Likely did not need to be transferred here for this at this point given her history. -pharm dvt prophylaxis is fine -recommend PPI -will follow up after CT scan done with further  recommendations.  -eventually will need GI evaluation but don't think this is urgent  LOS: 5 days    Chevis Pretty III 11/05/2022

## 2022-11-05 NOTE — Progress Notes (Addendum)
Progress Note  Primary GI: Unassigned   Subjective  Chief Complaint: Abdominal pain, possible perforated gastric ulcer, dilated CBD with stones, normal LFTs  Patient lying in bed, wishing to eat and drink. Family at bedside. Still complaining of abdominal pain, waiting to go for CT. Last bowel movement 2 days ago.    Objective   Vital signs in last 24 hours: Temp:  [97.7 F (36.5 C)-99 F (37.2 C)] 98.7 F (37.1 C) (11/11 0755) Pulse Rate:  [73-84] 73 (11/11 0755) Resp:  [15-18] 15 (11/11 0755) BP: (117-148)/(75-85) 148/78 (11/11 0755) SpO2:  [86 %-95 %] 91 % (11/11 0755)   Last BM recorded by nurses in past 5 days Stool Type: Type 6 (Mushy consistency with ragged edges) (11/04/2022  8:30 PM)  General:   female in no acute distress  Heart:  Regular rate and rhythm; no murmurs Pulm: Clear anteriorly; no wheezing Abdomen:  Soft, Obese AB, Hypoactive bowel sounds. marked tenderness in the epigastrium. With guarding and With rebound, No organomegaly appreciated. Extremities:  with  edema. Neurologic:  Alert and  oriented x4;  No focal deficits.  Psych:  Cooperative. Normal mood and affect.  Intake/Output from previous day: 11/10 0701 - 11/11 0700 In: 1332.6 [P.O.:240; I.V.:918.8; IV Piggyback:173.8] Out: 2600 [Urine:2600] Intake/Output this shift: Total I/O In: -  Out: 350 [Urine:350]  Studies/Results: No results found.  Lab Results: Recent Labs    11/04/22 1143  WBC 6.7  HGB 9.5*  HCT 31.4*  PLT 301   BMET Recent Labs    11/04/22 1143  NA 138  K 3.1*  CL 100  CO2 29  GLUCOSE 102*  BUN <5*  CREATININE 0.53  CALCIUM 8.5*   LFT Recent Labs    11/04/22 1143  PROT 4.9*  ALBUMIN 2.3*  AST 10*  ALT 13  ALKPHOS 113  BILITOT 0.2*   PT/INR No results for input(s): "LABPROT", "INR" in the last 72 hours.   Scheduled Meds:  (feeding supplement) PROSource Plus  30 mL Oral TID BM   alteplase  2 mg Intracatheter Once   amLODipine  5 mg Oral  Daily   Chlorhexidine Gluconate Cloth  6 each Topical Daily   enoxaparin (LOVENOX) injection  40 mg Subcutaneous Q24H   pantoprazole (PROTONIX) IV  40 mg Intravenous Q12H   pneumococcal 20-valent conjugate vaccine  0.5 mL Intramuscular Tomorrow-1000   Continuous Infusions:  sodium chloride     ceFEPime (MAXIPIME) IV 2 g (11/05/22 0950)   lactated ringers 100 mL/hr at 11/05/22 0600   levETIRAcetam 500 mg (11/05/22 0848)   metronidazole 500 mg (11/05/22 0559)   potassium chloride        Patient profile:   66 year old female with history of diastolic heart failure, bradycardia, moderate malnutrition, chronic pain syndrome/rheumatoid arthritis on BC powders daily, vodka 3-6 drinks weekly for 10 years presented with acute abdominal pain 11/6, likely perforated gastric ulcer.    Impression/Plan:   Likely gastric or peptic ulcer perforation with septic shock and peritonitis which is resolved On cefepime and metronidazole Blood cultures negative day 4  secondary to [facility] powder use daily and ETOH 3-6 vodka drinks x 10 years  Abdominal pain, pending CT abdomen pelvis with contrast Remain n.p.o. IVF Get CBC, CMET Surgery following Plan for Gastrografin UGI sometime during this week pending CT results PPI IV twice daily   Acute on chronic blood loss anemia Hemoglobin currently 9.5 ( 11.9) repeat labs today Likely secondary to peptic ulcer perforation in setting of  BC powder use On PPI twice daily IV, never had PPI infusion BM 11/03/2022 hard stools, brown, no evidence of melena or hematochezia.  Pete CBC   Choledocholithiasis without LFT elevation, no cholangitis AST 21 ALT 33  Alkphos 193 TBili 0.4 10/03/2022 INR 1.0  10/31/2022 Lactic acid 2.1 Noted on CT, MRCP demonstrated 2 small stones mid duct measuring up to 4 mm, common bile duct measures 12 mm, sludge in dilated gallbladder with pericholecystic fluid, equivocal for acute cholecystitis. Likely more chronic than acute with no  LFT elevation Continue to monitor may need ERCP at some point but not at this time. No LFTs today, repeat CMET   Pancreatic ductal dilatation with pancreatic atrophy 11/01/2022 MRCP prominent pancreatic duct up to 2.5 mm, Similar appearance dating back to 2017 alcohol disorder history, still drink ETOH per patient 3-6 vodka drinks a week for last 10 years.  If any diarrhea or loose stools, consider Creon Suggest repeat MRCP with and without outpatient non emergent for evaluation   Diastolic heart failure 09/29/2022 EF 64% grade 2 diastolic mildly elevated pulmonary artery systolic pressure normal valves.   Seizure disorder On Keppra   Opioid dependence Oxycodone   Constipation secondary to opioid dependence  Principal Problem:   Peritonitis (HCC) Active Problems:   Sepsis due to undetermined organism (HCC)   Opiate dependence --Chronic pain   Seizure (HCC)   Sinus bradycardia   Pressure injury of skin    LOS: 5 days   Doree Albee  11/05/2022, 12:01 PM   Attending physician's note   I have taken history, reviewed the chart and examined the patient. I performed a substantive portion of this encounter, including complete performance of at least one of the key components, in conjunction with the APP. I agree with the Advanced Practitioner's note, impression and recommendations.   Abdo pain d/t bowel (likely gastric ulcer) perforation with peritonitis on cefepime/flagyl Asymptomatic incidental small choledocholithiasis on MRCP with Nl LFTs. No asc cholangitis or pancreatitis. Chronic pain syndrome on opioids.  Plan: -CT AP with contrast today -IV protonix BID -Likely Gastrografin (--> Ba, if -ve) UGI series Monday -Appreciate surgery consultation. -Not a candidate for any endoscopic procedures currently.   Edman Circle, MD [facility] [phone]

## 2022-11-05 NOTE — Progress Notes (Signed)
[facility]  PROGRESS NOTE  Jamie [name] UVO:[id number] DOB: 22-Jul-1956 DOA: 10/31/2022 PCP: Barbie Banner, MD   Brief HPI:   66 year old female with a history of chronic pain syndrome, anxiety, alcohol use in remission, chronic back pain, seizure presenting with worsening upper abdominal pain that began on 10/29/2022.  Notably, the patient was recently discharged from the hospital after a stay from 09/28/2029 to 10/04/2022 where she was treated for sepsis secondary to pneumonia.  The patient required vasopressors for short period of time during the hospitalization.  Her hospital stay was complicated by a seizure for which she was started on Keppra.  The patient was also noted to have symptomatic bradycardia which was thought to be due to her metabolic abnormalities.  This resolved with treatment of her acute medical issues.  The patient was discharged home with home health PT. Apparently, the patient had progressive weakness since 10/29/2022 to the point where she was unable to get off the couch.    In the ED, the patient bradycardic and hypotensive.  She was started on vasopressors.  WBC 29.7. CT of the abdomen and pelvis showed foci of free air in the upper abdomen with moderate thickening of the gastric cardia and distal gastric body.  There was also marked intrahepatic and extrahepatic ductal dilatation with a 4 mm round density in the distal CBD.  There was also pancreatic atrophy and pancreatic duct dilatation.  The patient was started on cefepime and metronidazole and IV fluids.     Subjective   Patient continues to have abdominal pain.  CT abdomen/pelvis ordered yesterday and is currently pending.   Assessment/Plan:    Septic shock -Secondary to peritonitis and perforated viscus, resolved -Patient has been weaned off vasopressors and has remained stable without them -Lactic acid peaked to 2.1 -Continue cefepime and metronidazole, day #6 -WBC is down to 6.7 -UA was negative for  pyuria -Follow blood culture results  Peritonitis -Secondary to microperforation in her stomach as seen on CT abdomen/pelvis -Patient says that she has been using BC powders on a daily basis for several years due to rheumatoid arthritis -General surgery felt that patient microperforation might have sealed, she was started on clear liquid diet -Discussed with Dr. Dwain Sarna, he recommends making patient n.p.o. and repeating a CT abdomen/pelvis to check for microperforation today -RN has called radiology, CT abdomen/pelvis will be done today  Biliary ductal dilatation -Noted on CT abdomen/pelvis -There was concern for possible choledocholithiasis -MRCP showed distal common bile duct measuring 12 mm with 2 small stones in the mid duct measuring up to 4 mm; sludge in the dilated gallbladder with pericholecystic fluid acute focal for acute cholecystitis -Gastroenterology has been consulted for possible ERCP  Seizure disorder -Patient had unprovoked seizure during her last hospital admission -Currently on Keppra IV  Bradycardia -Seems to have resolved -Continue monitoring on telemetry  Opioid dependence -Patient received oxycodone 10 mg, number 120 tablets on a monthly basis -Started oxycodone in the hospital  Anxiety -Patient exam X 1 mg on daily basis -Continue Xanax 1 mg 3 times daily as needed  Sacral 2 pressure ulcer -Present on admission -Continue local wound care  -Hypokalemia -We will replace potassium today and follow BMP in am     Medications     (feeding supplement) PROSource Plus  30 mL Oral TID BM   alteplase  2 mg Intracatheter Once   amLODipine  5 mg Oral Daily   Chlorhexidine Gluconate Cloth  6 each Topical Daily  enoxaparin (LOVENOX) injection  40 mg Subcutaneous Q24H   iohexol       pantoprazole (PROTONIX) IV  40 mg Intravenous Q12H   pneumococcal 20-valent conjugate vaccine  0.5 mL Intramuscular Tomorrow-1000     Data Reviewed:   CBG:  No  results for input([name]): "GLUCAP" in the last 168 hours.  SpO2: 91 % O2 Flow Rate (L/min): 2 L/min    Vitals:   11/04/22 1637 11/04/22 2107 11/05/22 0529 11/05/22 0755  BP: 134/85 (!) 146/82 117/75 (!) 148/78  Pulse: 84 75 75 73  Resp: 17 16 18 15   Temp: 99 F (37.2 C) 98.7 F (37.1 C) 97.7 F (36.5 C) 98.7 F (37.1 C)  TempSrc: Oral Oral Oral Oral  SpO2: (!) 86% 95% 90% 91%  Weight:      Height:          Data Reviewed:  Basic Metabolic Panel: Recent Labs  Lab 10/31/22 1317 11/01/22 0336 11/02/22 0358 11/04/22 1143  NA 136 135 134* 138  K 3.5 3.8 3.9 3.1*  CL 110 113* 106 100  CO2 14* 18* 21* 29  GLUCOSE 97 90 114* 102*  BUN 23 16 10  <5*  CREATININE 0.98 0.88 0.60 0.53  CALCIUM 8.7* 8.3* 9.0 8.5*  MG  --  2.1 1.9  --   PHOS  --  3.0  --   --     CBC: Recent Labs  Lab 10/31/22 1317 11/01/22 0336 11/02/22 0358 11/04/22 1143  WBC 29.7* 13.9* 20.8* 6.7  NEUTROABS 27.7*  --   --   --   HGB 11.4* 9.7* 10.7* 9.5*  HCT 37.3 31.2* 34.7* 31.4*  MCV 83.3 82.5 81.1 83.1  PLT 461* 453* 533* 301    LFT Recent Labs  Lab 10/31/22 1317 11/01/22 0336 11/02/22 0358 11/04/22 1143  AST 187* 53* 21 10*  ALT 86* 52* 33 13  ALKPHOS 263* 203* 193* 113  BILITOT 0.5 0.5 0.4 0.2*  PROT 7.1 6.3* 6.5 4.9*  ALBUMIN 3.7 3.2* 3.1* 2.3*     Antibiotics: Anti-infectives (From admission, onward)    Start     Dose/Rate Route Frequency Ordered Stop   11/02/22 1000  ceFEPIme (MAXIPIME) 2 g in sodium chloride 0.9 % 100 mL IVPB        2 g 200 mL/hr over 30 Minutes Intravenous Every 8 hours 11/02/22 0754     11/01/22 0600  metroNIDAZOLE (FLAGYL) IVPB 500 mg        500 mg 100 mL/hr over 60 Minutes Intravenous Every 12 hours 10/31/22 1747     10/31/22 2200  ceFEPIme (MAXIPIME) 2 g in sodium chloride 0.9 % 100 mL IVPB  Status:  Discontinued        2 g 200 mL/hr over 30 Minutes Intravenous Every 12 hours 10/31/22 1757 11/02/22 0754   10/31/22 1600  metroNIDAZOLE (FLAGYL) IVPB  500 mg        500 mg 100 mL/hr over 60 Minutes Intravenous  Once 10/31/22 1546 10/31/22 1759   10/31/22 1445  ceFEPIme (MAXIPIME) 2 g in sodium chloride 0.9 % 100 mL IVPB        2 g 200 mL/hr over 30 Minutes Intravenous  Once 10/31/22 1440 10/31/22 1643        DVT prophylaxis: Lovenox  Code Status: Full code  Family Communication:    CONSULTS    Objective    Physical Examination:   General-appears in no acute distress Heart-S1-S2, regular, no murmur auscultated Lungs-clear to auscultation bilaterally,  no wheezing or crackles auscultated Abdomen-soft, tender palpation in epigastric region, no organomegaly Extremities-no edema in the lower extremities Neuro-alert, oriented x3, no focal deficit noted   Status is: Inpatient:      Pressure Injury 10/31/22 Sacrum Medial Stage 2 -  Partial thickness loss of dermis presenting as a shallow open injury with a red, pink wound bed without slough. (Active)  10/31/22 2322  Location: Sacrum  Location Orientation: Medial  Staging: Stage 2 -  Partial thickness loss of dermis presenting as a shallow open injury with a red, pink wound bed without slough.  Wound Description (Comments):   Present on Admission: Yes        Jamie [name] Jamie [name]   [facility] If 7PM-7AM, please contact night-coverage at [email or web address], Office  [phone]   11/05/2022, 1:51 PM  LOS: 5 days

## 2022-11-06 DIAGNOSIS — K659 Peritonitis, unspecified: Secondary | ICD-10-CM | POA: Diagnosis not present

## 2022-11-06 DIAGNOSIS — A419 Sepsis, unspecified organism: Secondary | ICD-10-CM | POA: Diagnosis not present

## 2022-11-06 DIAGNOSIS — R569 Unspecified convulsions: Secondary | ICD-10-CM | POA: Diagnosis not present

## 2022-11-06 LAB — COMPREHENSIVE METABOLIC PANEL
ALT: 11 U/L (ref 0–44)
AST: 9 U/L — ABNORMAL LOW (ref 15–41)
Albumin: 2.3 g/dL — ABNORMAL LOW (ref 3.5–5.0)
Alkaline Phosphatase: 107 U/L (ref 38–126)
Anion gap: 7 (ref 5–15)
BUN: <5 mg/dL — ABNORMAL LOW (ref 8–23)
CO2: 30 mmol/L (ref 22–32)
Calcium: 8.6 mg/dL — ABNORMAL LOW (ref 8.9–10.3)
Chloride: 101 mmol/L (ref 98–111)
Creatinine, Ser: 0.53 mg/dL (ref 0.44–1.00)
GFR, Estimated: >60 mL/min (ref >60)
Glucose, Bld: 92 mg/dL (ref 70–99)
Potassium: 3.2 mmol/L — ABNORMAL LOW (ref 3.5–5.1)
Sodium: 138 mmol/L (ref 135–145)
Total Bilirubin: 0.3 mg/dL (ref 0.3–1.2)
Total Protein: 4.9 g/dL — ABNORMAL LOW (ref 6.5–8.1)

## 2022-11-06 LAB — CBC WITH DIFFERENTIAL/PLATELET
Abs Immature Granulocytes: 0.05 10*3/uL (ref 0.00–0.07)
Basophils Absolute: 0.1 10*3/uL (ref 0.0–0.1)
Basophils Relative: 1 %
Eosinophils Absolute: 0.7 10*3/uL — ABNORMAL HIGH (ref 0.0–0.5)
Eosinophils Relative: 10 %
HCT: 30.1 % — ABNORMAL LOW (ref 36.0–46.0)
Hemoglobin: 9.1 g/dL — ABNORMAL LOW (ref 12.0–15.0)
Immature Granulocytes: 1 %
Lymphocytes Relative: 21 %
Lymphs Abs: 1.5 10*3/uL (ref 0.7–4.0)
MCH: 24.9 pg — ABNORMAL LOW (ref 26.0–34.0)
MCHC: 30.2 g/dL (ref 30.0–36.0)
MCV: 82.2 fL (ref 80.0–100.0)
Monocytes Absolute: 0.6 10*3/uL (ref 0.1–1.0)
Monocytes Relative: 8 %
Neutro Abs: 4.4 10*3/uL (ref 1.7–7.7)
Neutrophils Relative %: 59 %
Platelets: 284 10*3/uL (ref 150–400)
RBC: 3.66 MIL/uL — ABNORMAL LOW (ref 3.87–5.11)
RDW: 14.9 % (ref 11.5–15.5)
WBC: 7.3 10*3/uL (ref 4.0–10.5)
nRBC: 0 % (ref 0.0–0.2)

## 2022-11-06 MED ORDER — ONDANSETRON HCL 4 MG/2ML IJ SOLN
4.0000 mg | Freq: Four times a day (QID) | INTRAMUSCULAR | Status: DC | PRN
  Administered 2022-11-09 – 2022-11-21 (×6): 4 mg via INTRAVENOUS
  Filled 2022-11-06 (×5): qty 2

## 2022-11-06 MED ORDER — LOPERAMIDE HCL 2 MG PO CAPS
2.0000 mg | ORAL_CAPSULE | Freq: Once | ORAL | Status: AC
  Administered 2022-11-06: 2 mg via ORAL
  Filled 2022-11-06: qty 1

## 2022-11-06 MED ORDER — ACETAMINOPHEN 325 MG PO TABS
650.0000 mg | ORAL_TABLET | Freq: Four times a day (QID) | ORAL | Status: DC | PRN
  Administered 2022-11-07 – 2022-11-10 (×9): 650 mg via ORAL
  Filled 2022-11-06 (×9): qty 2

## 2022-11-06 MED ORDER — SODIUM CHLORIDE 0.9 % IV SOLN
2.0000 g | Freq: Three times a day (TID) | INTRAVENOUS | Status: DC
  Administered 2022-11-07 – 2022-11-09 (×7): 2 g via INTRAVENOUS
  Filled 2022-11-06 (×7): qty 12.5

## 2022-11-06 MED ORDER — ENOXAPARIN SODIUM 40 MG/0.4ML IJ SOSY
40.0000 mg | PREFILLED_SYRINGE | INTRAMUSCULAR | Status: DC
  Administered 2022-11-07 – 2022-11-27 (×21): 40 mg via SUBCUTANEOUS
  Filled 2022-11-06 (×21): qty 0.4

## 2022-11-06 MED ORDER — SODIUM CHLORIDE 0.9 % IV SOLN: INTRAVENOUS | Status: DC

## 2022-11-06 MED ORDER — HYDROMORPHONE HCL 1 MG/ML IJ SOLN: 0.5000 mg | INTRAMUSCULAR | Status: DC | PRN

## 2022-11-06 MED ORDER — HYDROMORPHONE HCL 1 MG/ML IJ SOLN
0.5000 mg | INTRAMUSCULAR | Status: DC | PRN
  Administered 2022-11-06 – 2022-11-10 (×24): 1 mg via INTRAVENOUS
  Filled 2022-11-06 (×24): qty 1

## 2022-11-06 MED ORDER — OXYCODONE HCL 5 MG PO TABS
5.0000 mg | ORAL_TABLET | ORAL | Status: DC | PRN
  Administered 2022-11-06: 5 mg via ORAL
  Filled 2022-11-06: qty 1

## 2022-11-06 MED ORDER — CHLORHEXIDINE GLUCONATE CLOTH 2 % EX PADS
6.0000 | MEDICATED_PAD | Freq: Every day | CUTANEOUS | Status: DC
  Administered 2022-11-06 – 2022-11-18 (×9): 6 via TOPICAL

## 2022-11-06 MED ORDER — METRONIDAZOLE 500 MG/100ML IV SOLN
500.0000 mg | Freq: Two times a day (BID) | INTRAVENOUS | Status: DC
  Administered 2022-11-07 – 2022-11-09 (×5): 500 mg via INTRAVENOUS
  Filled 2022-11-06 (×5): qty 100

## 2022-11-06 MED ORDER — PANTOPRAZOLE SODIUM 40 MG IV SOLR
40.0000 mg | Freq: Two times a day (BID) | INTRAVENOUS | Status: AC
  Administered 2022-11-06 – 2022-11-07 (×3): 40 mg via INTRAVENOUS
  Filled 2022-11-06 (×3): qty 10

## 2022-11-06 MED ORDER — LEVETIRACETAM IN NACL 500 MG/100ML IV SOLN
500.0000 mg | Freq: Two times a day (BID) | INTRAVENOUS | Status: DC
  Administered 2022-11-06 – 2022-11-08 (×5): 500 mg via INTRAVENOUS
  Filled 2022-11-06 (×6): qty 100

## 2022-11-06 MED ORDER — SUCRALFATE 1 G PO TABS
1.0000 g | ORAL_TABLET | Freq: Three times a day (TID) | ORAL | Status: DC
  Administered 2022-11-06 – 2022-11-28 (×86): 1 g via ORAL
  Filled 2022-11-06 (×88): qty 1

## 2022-11-06 MED ORDER — ALPRAZOLAM 0.25 MG PO TABS
1.0000 mg | ORAL_TABLET | Freq: Three times a day (TID) | ORAL | Status: DC | PRN
  Administered 2022-11-06 – 2022-11-24 (×50): 1 mg via ORAL
  Filled 2022-11-06 (×50): qty 4

## 2022-11-06 MED ORDER — ACETAMINOPHEN 650 MG RE SUPP: 650.0000 mg | Freq: Four times a day (QID) | RECTAL | Status: DC | PRN

## 2022-11-06 MED ORDER — POTASSIUM CHLORIDE 10 MEQ/100ML IV SOLN
10.0000 meq | INTRAVENOUS | Status: AC
  Administered 2022-11-06 (×3): 10 meq via INTRAVENOUS
  Filled 2022-11-06 (×3): qty 100

## 2022-11-06 MED ORDER — OXYCODONE HCL 5 MG PO TABS
10.0000 mg | ORAL_TABLET | ORAL | Status: DC | PRN
  Administered 2022-11-06 – 2022-11-10 (×15): 10 mg via ORAL
  Filled 2022-11-06 (×19): qty 2

## 2022-11-06 MED ORDER — ONDANSETRON 4 MG PO TBDP
4.0000 mg | ORAL_TABLET | Freq: Four times a day (QID) | ORAL | Status: DC | PRN
  Administered 2022-11-12 – 2022-11-28 (×4): 4 mg via ORAL
  Filled 2022-11-06 (×9): qty 1

## 2022-11-06 NOTE — Progress Notes (Signed)
Subjective/Chief Complaint: Still complains of abd pain. CT yesterday showed improvement in contained gastric perf and resolution of gb and bile duct distension   Objective: Vital signs in last 24 hours: Temp:  [98 F (36.7 C)-98.6 F (37 C)] 98.1 F (36.7 C) (11/12 0801) Pulse Rate:  [74-81] 79 (11/12 0801) Resp:  [16-18] 16 (11/12 0801) BP: (128-153)/(75-87) 137/80 (11/12 0801) SpO2:  [91 %-98 %] 91 % (11/12 0801) Last BM Date : 11/06/22  Intake/Output from previous day: 11/11 0701 - 11/12 0700 In: 2932.1 [I.V.:1964.4; IV Piggyback:967.7] Out: [id number] [Urine:3750] Intake/Output this shift: No intake/output data recorded.  General appearance: alert and cooperative Resp: clear to auscultation bilaterally Cardio: regular rate and rhythm GI: soft, moderate tenderness. No guarding or peritonitis  Lab Results:  Recent Labs    11/05/22 1610 11/06/22 0400  WBC 7.9 7.3  HGB 9.4* 9.1*  HCT 30.6* 30.1*  PLT 309 284   BMET Recent Labs    11/05/22 1610 11/06/22 0400  NA 136 138  K 3.4* 3.2*  CL 100 101  CO2 31 30  GLUCOSE 92 92  BUN <5* <5*  CREATININE 0.51 0.53  CALCIUM 8.5* 8.6*   PT/INR No results for input(s): "LABPROT", "INR" in the last 72 hours. ABG No results for input(s): "PHART", "HCO3" in the last 72 hours.  Invalid input(s): "PCO2", "PO2"  Studies/Results: CT ABDOMEN PELVIS W CONTRAST  Result Date: 11/05/2022 CLINICAL DATA:  Acute abdominal pain, free intraperitoneal gas. Gastric perforation EXAM: CT ABDOMEN AND PELVIS WITH CONTRAST TECHNIQUE: Multidetector CT imaging of the abdomen and pelvis was performed using the standard protocol following bolus administration of intravenous contrast. RADIATION DOSE REDUCTION: This exam was performed according to the departmental dose-optimization program which includes automated exposure control, adjustment of the mA and/or kV according to patient size and/or use of iterative reconstruction technique. CONTRAST:   66mL OMNIPAQUE IOHEXOL 350 MG/ML SOLN COMPARISON:  10/31/2022 FINDINGS: Lower chest: Small bilateral pleural effusions have developed with associated bibasilar compressive atelectasis. Cardiac size within normal limits. No pericardial effusion. Hepatobiliary: The liver is unremarkable. Previously noted intra and extrahepatic biliary ductal dilation has resolved. The gallbladder is unremarkable. Previously noted gallbladder distension has resolved. Pancreas: Borderline enlargement of the main pancreatic duct is again identified to the level of the ampulla where this measures 5 mm in diameter. No superimposed peripancreatic inflammatory changes are identified. No discrete pancreatic mass is seen. Spleen: Unremarkable Adrenals/Urinary Tract: Adrenal glands are unremarkable. Kidneys are normal, without renal calculi, focal lesion, or hydronephrosis. Bladder is unremarkable. Stomach/Bowel: Mild ascites has developed. Asymmetric wall thickening involving the gastric antrum anteriorly, best appreciated at axial image # 27/3 and sagittal image # [id number] is again identified raising the question of a focal inflammatory process as can be seen peptic ulcer disease. Previously noted punctate perigastric free intraperitoneal gas has resolved. No evidence of obstruction. The small and large bowel are unremarkable save for mild sigmoid diverticulosis. The appendix is unremarkable. Vascular/Lymphatic: Mild aortoiliac atherosclerotic calcification. No aortic aneurysm. No pathologic adenopathy within the abdomen and pelvis. Reproductive: Uterus and bilateral adnexa are unremarkable. Other: Moderate diffuse subcutaneous body wall edema and mild infiltration within the retroperitoneum extending into the pelvis in keeping with retroperitoneal edema is present in keeping with changes of developing anasarca. Tiny fat containing umbilical hernia again noted. Musculoskeletal: L5-S1 posterior lumbar fusion and decompression has been performed.  Osseous structures are otherwise age-appropriate. IMPRESSION: 1. Interval development of small bilateral pleural effusions, mild ascites, and retroperitoneal edema in keeping with  developing anasarca. 2. Asymmetric wall thickening involving the gastric antrum anteriorly raising the question of a focal inflammatory process as can be seen with peptic ulcer disease. Previously noted punctate perigastric free intraperitoneal gas has resolved. Mild ascites has developed, however, though this may simply represent a component of developing anasarca. Correlation with endoscopy may be helpful for further evaluation. 3. Interval resolution of intra and extrahepatic biliary ductal dilation and gallbladder distension. 4. Stable borderline enlargement of the main pancreatic duct is again identified to the level of the ampulla, better assessed on prior MRI examination of 11/01/2022. No superimposed peripancreatic inflammatory changes are identified. 5. Mild sigmoid diverticulosis. 6.  Aortic Atherosclerosis (ICD10-I70.0). Electronically Signed   By: Helyn Numbers M.D.   On: 11/05/2022 19:57    Anti-infectives: Anti-infectives (From admission, onward)    Start     Dose/Rate Route Frequency Ordered Stop   11/02/22 1000  ceFEPIme (MAXIPIME) 2 g in sodium chloride 0.9 % 100 mL IVPB        2 g 200 mL/hr over 30 Minutes Intravenous Every 8 hours 11/02/22 0754     11/01/22 0600  metroNIDAZOLE (FLAGYL) IVPB 500 mg        500 mg 100 mL/hr over 60 Minutes Intravenous Every 12 hours 10/31/22 1747     10/31/22 2200  ceFEPIme (MAXIPIME) 2 g in sodium chloride 0.9 % 100 mL IVPB  Status:  Discontinued        2 g 200 mL/hr over 30 Minutes Intravenous Every 12 hours 10/31/22 1757 11/02/22 0754   10/31/22 1600  metroNIDAZOLE (FLAGYL) IVPB 500 mg        500 mg 100 mL/hr over 60 Minutes Intravenous  Once 10/31/22 1546 10/31/22 1759   10/31/22 1445  ceFEPIme (MAXIPIME) 2 g in sodium chloride 0.9 % 100 mL IVPB        2 g 200 mL/hr  over 30 Minutes Intravenous  Once 10/31/22 1440 10/31/22 1643       Assessment/Plan: s/p * No surgery found * Advance diet. Allow clears today Continue IV abx for gastric perf High dose PPI  LOS: 6 days    Chevis Pretty III 11/06/2022

## 2022-11-06 NOTE — Progress Notes (Signed)
[facility]  PROGRESS NOTE  Jamie [name] UVO:[id number] DOB: 22-Jul-1956 DOA: 10/31/2022 PCP: Barbie Banner, MD   Brief HPI:   66 year old female with a history of chronic pain syndrome, anxiety, alcohol use in remission, chronic back pain, seizure presenting with worsening upper abdominal pain that began on 10/29/2022.  Notably, the patient was recently discharged from the hospital after a stay from 09/28/2029 to 10/04/2022 where she was treated for sepsis secondary to pneumonia.  The patient required vasopressors for short period of time during the hospitalization.  Her hospital stay was complicated by a seizure for which she was started on Keppra.  The patient was also noted to have symptomatic bradycardia which was thought to be due to her metabolic abnormalities.  This resolved with treatment of her acute medical issues.  The patient was discharged home with home health PT. Apparently, the patient had progressive weakness since 10/29/2022 to the point where she was unable to get off the couch.    In the ED, the patient bradycardic and hypotensive.  She was started on vasopressors.  WBC 29.7. CT of the abdomen and pelvis showed foci of free air in the upper abdomen with moderate thickening of the gastric cardia and distal gastric body.  There was also marked intrahepatic and extrahepatic ductal dilatation with a 4 mm round density in the distal CBD.  There was also pancreatic atrophy and pancreatic duct dilatation.  The patient was started on cefepime and metronidazole and IV fluids.     Subjective   Patient continues to have abdominal pain.  CT abdomen/pelvis ordered yesterday and is currently pending.   Assessment/Plan:    Septic shock -Secondary to peritonitis and perforated viscus, resolved -Patient has been weaned off vasopressors and has remained stable without them -Lactic acid peaked to 2.1 -Continue cefepime and metronidazole, day #6 -WBC is down to 6.7 -UA was negative for  pyuria -Follow blood culture results  Peritonitis -Secondary to microperforation in her stomach as seen on CT abdomen/pelvis -Patient says that she has been using BC powders on a daily basis for several years due to rheumatoid arthritis -General surgery felt that patient microperforation might have sealed, she was started on clear liquid diet -Discussed with Dr. Dwain Sarna, he recommends making patient n.p.o. and repeating a CT abdomen/pelvis to check for microperforation today -RN has called radiology, CT abdomen/pelvis will be done today  Biliary ductal dilatation -Noted on CT abdomen/pelvis -There was concern for possible choledocholithiasis -MRCP showed distal common bile duct measuring 12 mm with 2 small stones in the mid duct measuring up to 4 mm; sludge in the dilated gallbladder with pericholecystic fluid acute focal for acute cholecystitis -Gastroenterology has been consulted for possible ERCP  Seizure disorder -Patient had unprovoked seizure during her last hospital admission -Currently on Keppra IV  Bradycardia -Seems to have resolved -Continue monitoring on telemetry  Opioid dependence -Patient received oxycodone 10 mg, number 120 tablets on a monthly basis -Started oxycodone in the hospital  Anxiety -Patient exam X 1 mg on daily basis -Continue Xanax 1 mg 3 times daily as needed  Sacral 2 pressure ulcer -Present on admission -Continue local wound care  -Hypokalemia -We will replace potassium today and follow BMP in am     Medications     (feeding supplement) PROSource Plus  30 mL Oral TID BM   alteplase  2 mg Intracatheter Once   amLODipine  5 mg Oral Daily   Chlorhexidine Gluconate Cloth  6 each Topical Daily  enoxaparin (LOVENOX) injection  40 mg Subcutaneous Q24H   pantoprazole (PROTONIX) IV  40 mg Intravenous Q12H   pneumococcal 20-valent conjugate vaccine  0.5 mL Intramuscular Tomorrow-1000     Data Reviewed:   CBG:  No results for input(s):  "GLUCAP" in the last 168 hours.  SpO2: 91 % O2 Flow Rate (L/min): 2 L/min    Vitals:   11/05/22 1810 11/05/22 2053 11/06/22 0650 11/06/22 0801  BP: 128/81 (!) 153/87 138/75 137/80  Pulse: 75 74 81 79  Resp: 17 18 18 16   Temp: 98.2 F (36.8 C) 98 F (36.7 C) 98.6 F (37 C) 98.1 F (36.7 C)  TempSrc: Oral Oral Oral Oral  SpO2: 96% 92% 98% 91%  Weight:      Height:          Data Reviewed:  Basic Metabolic Panel: Recent Labs  Lab 11/01/22 0336 11/02/22 0358 11/04/22 1143 11/05/22 1610 11/06/22 0400  NA 135 134* 138 136 138  K 3.8 3.9 3.1* 3.4* 3.2*  CL 113* 106 100 100 101  CO2 18* 21* 29 31 30   GLUCOSE 90 114* 102* 92 92  BUN 16 10 <5* <5* <5*  CREATININE 0.88 0.60 0.53 0.51 0.53  CALCIUM 8.3* 9.0 8.5* 8.5* 8.6*  MG 2.1 1.9  --   --   --   PHOS 3.0  --   --   --   --     CBC: Recent Labs  Lab 10/31/22 1317 11/01/22 0336 11/02/22 0358 11/04/22 1143 11/05/22 1610 11/06/22 0400  WBC 29.7* 13.9* 20.8* 6.7 7.9 7.3  NEUTROABS 27.7*  --   --   --  4.6 4.4  HGB 11.4* 9.7* 10.7* 9.5* 9.4* 9.1*  HCT 37.3 31.2* 34.7* 31.4* 30.6* 30.1*  MCV 83.3 82.5 81.1 83.1 82.0 82.2  PLT 461* 453* 533* 301 309 284    LFT Recent Labs  Lab 11/01/22 0336 11/02/22 0358 11/04/22 1143 11/05/22 1610 11/06/22 0400  AST 53* 21 10* 9* 9*  ALT 52* 33 13 9 11   ALKPHOS 203* 193* 113 112 107  BILITOT 0.5 0.4 0.2* 0.2* 0.3  PROT 6.3* 6.5 4.9* 5.1* 4.9*  ALBUMIN 3.2* 3.1* 2.3* 2.4* 2.3*     Antibiotics: Anti-infectives (From admission, onward)    Start     Dose/Rate Route Frequency Ordered Stop   11/02/22 1000  ceFEPIme (MAXIPIME) 2 g in sodium chloride 0.9 % 100 mL IVPB        2 g 200 mL/hr over 30 Minutes Intravenous Every 8 hours 11/02/22 0754     11/01/22 0600  metroNIDAZOLE (FLAGYL) IVPB 500 mg        500 mg 100 mL/hr over 60 Minutes Intravenous Every 12 hours 10/31/22 1747     10/31/22 2200  ceFEPIme (MAXIPIME) 2 g in sodium chloride 0.9 % 100 mL IVPB  Status:   Discontinued        2 g 200 mL/hr over 30 Minutes Intravenous Every 12 hours 10/31/22 1757 11/02/22 0754   10/31/22 1600  metroNIDAZOLE (FLAGYL) IVPB 500 mg        500 mg 100 mL/hr over 60 Minutes Intravenous  Once 10/31/22 1546 10/31/22 1759   10/31/22 1445  ceFEPIme (MAXIPIME) 2 g in sodium chloride 0.9 % 100 mL IVPB        2 g 200 mL/hr over 30 Minutes Intravenous  Once 10/31/22 1440 10/31/22 1643        DVT prophylaxis: Lovenox  Code Status: Full code  Family Communication:    CONSULTS    Objective    Physical Examination:   General-appears in no acute distress Heart-S1-S2, regular, no murmur auscultated Lungs-clear to auscultation bilaterally, no wheezing or crackles auscultated Abdomen-soft, mild tenderness in epigastric region,  no organomegaly Extremities-no edema in the lower extremities Neuro-alert, oriented x3, no focal deficit noted   Status is: Inpatient:      Pressure Injury 10/31/22 Sacrum Medial Stage 2 -  Partial thickness loss of dermis presenting as a shallow open injury with a red, pink wound bed without slough. (Active)  10/31/22 2322  Location: Sacrum  Location Orientation: Medial  Staging: Stage 2 -  Partial thickness loss of dermis presenting as a shallow open injury with a red, pink wound bed without slough.  Wound Description (Comments):   Present on Admission: Yes        Minetta Krisher S Jonee Lamore   [facility] If 7PM-7AM, please contact night-coverage at [email or web address], Office  [phone]   11/06/2022, 2:45 PM  LOS: 6 days

## 2022-11-06 NOTE — Progress Notes (Addendum)
Progress Note  Primary GI: Unassigned   Subjective  Chief Complaint: Abdominal pain, possible perforated gastric ulcer, dilated CBD with stones, normal LFTs  Patient lying in bed no family at bedside. She has been able to have liquid diet, tolerating well denies nausea vomiting. Denies fevers or chills. Passing gas but no bowel movement yet. Patient continues to complain of abdominal pain, states pain medications are not working as well as they used to, requesting for higher milligram or more frequent.  Patient has history of chronic pain    Objective   Vital signs in last 24 hours: Temp:  [98 F (36.7 C)-98.6 F (37 C)] 98.1 F (36.7 C) (11/12 0801) Pulse Rate:  [74-81] 79 (11/12 0801) Resp:  [16-18] 16 (11/12 0801) BP: (128-153)/(75-87) 137/80 (11/12 0801) SpO2:  [91 %-98 %] 91 % (11/12 0801) Last BM Date : 11/06/22 Last BM recorded by nurses in past 5 days Stool Type: Type 6 (Mushy consistency with ragged edges) (11/04/2022  8:30 PM)  General:   female in no acute distress  Heart:  Regular rate and rhythm; no murmurs Pulm: Clear anteriorly; no wheezing Abdomen:  Soft, Obese AB, Hypoactive bowel sounds. moderate tenderness in the epigastrium. With guarding and With rebound, No organomegaly appreciated. Extremities:  with  edema. Neurologic:  Alert and  oriented x4;  No focal deficits.  Psych:  Cooperative. Normal mood and affect.  Intake/Output from previous day: 11/11 0701 - 11/12 0700 In: 2932.1 [I.V.:1964.4; IV Piggyback:967.7] Out: 3750 [Urine:3750] Intake/Output this shift: Total I/O In: -  Out: 300 [Urine:300]  Studies/Results: CT ABDOMEN PELVIS W CONTRAST  Result Date: 11/05/2022 CLINICAL DATA:  Acute abdominal pain, free intraperitoneal gas. Gastric perforation EXAM: CT ABDOMEN AND PELVIS WITH CONTRAST TECHNIQUE: Multidetector CT imaging of the abdomen and pelvis was performed using the standard protocol following bolus administration of intravenous  contrast. RADIATION DOSE REDUCTION: This exam was performed according to the departmental dose-optimization program which includes automated exposure control, adjustment of the mA and/or kV according to patient size and/or use of iterative reconstruction technique. CONTRAST:  27mL OMNIPAQUE IOHEXOL 350 MG/ML SOLN COMPARISON:  10/31/2022 FINDINGS: Lower chest: Small bilateral pleural effusions have developed with associated bibasilar compressive atelectasis. Cardiac size within normal limits. No pericardial effusion. Hepatobiliary: The liver is unremarkable. Previously noted intra and extrahepatic biliary ductal dilation has resolved. The gallbladder is unremarkable. Previously noted gallbladder distension has resolved. Pancreas: Borderline enlargement of the main pancreatic duct is again identified to the level of the ampulla where this measures 5 mm in diameter. No superimposed peripancreatic inflammatory changes are identified. No discrete pancreatic mass is seen. Spleen: Unremarkable Adrenals/Urinary Tract: Adrenal glands are unremarkable. Kidneys are normal, without renal calculi, focal lesion, or hydronephrosis. Bladder is unremarkable. Stomach/Bowel: Mild ascites has developed. Asymmetric wall thickening involving the gastric antrum anteriorly, best appreciated at axial image # 27/3 and sagittal image # 56-64 is again identified raising the question of a focal inflammatory process as can be seen peptic ulcer disease. Previously noted punctate perigastric free intraperitoneal gas has resolved. No evidence of obstruction. The small and large bowel are unremarkable save for mild sigmoid diverticulosis. The appendix is unremarkable. Vascular/Lymphatic: Mild aortoiliac atherosclerotic calcification. No aortic aneurysm. No pathologic adenopathy within the abdomen and pelvis. Reproductive: Uterus and bilateral adnexa are unremarkable. Other: Moderate diffuse subcutaneous body wall edema and mild infiltration within  the retroperitoneum extending into the pelvis in keeping with retroperitoneal edema is present in keeping with changes of developing anasarca. Tiny fat  containing umbilical hernia again noted. Musculoskeletal: L5-S1 posterior lumbar fusion and decompression has been performed. Osseous structures are otherwise age-appropriate. IMPRESSION: 1. Interval development of small bilateral pleural effusions, mild ascites, and retroperitoneal edema in keeping with developing anasarca. 2. Asymmetric wall thickening involving the gastric antrum anteriorly raising the question of a focal inflammatory process as can be seen with peptic ulcer disease. Previously noted punctate perigastric free intraperitoneal gas has resolved. Mild ascites has developed, however, though this may simply represent a component of developing anasarca. Correlation with endoscopy may be helpful for further evaluation. 3. Interval resolution of intra and extrahepatic biliary ductal dilation and gallbladder distension. 4. Stable borderline enlargement of the main pancreatic duct is again identified to the level of the ampulla, better assessed on prior MRI examination of 11/01/2022. No superimposed peripancreatic inflammatory changes are identified. 5. Mild sigmoid diverticulosis. 6.  Aortic Atherosclerosis (ICD10-I70.0). Electronically Signed   By: Helyn Numbers M.D.   On: 11/05/2022 19:57    Lab Results: Recent Labs    11/04/22 1143 11/05/22 1610 11/06/22 0400  WBC 6.7 7.9 7.3  HGB 9.5* 9.4* 9.1*  HCT 31.4* 30.6* 30.1*  PLT 301 309 284   BMET Recent Labs    11/04/22 1143 11/05/22 1610 11/06/22 0400  NA 138 136 138  K 3.1* 3.4* 3.2*  CL 100 100 101  CO2 29 31 30   GLUCOSE 102* 92 92  BUN <5* <5* <5*  CREATININE 0.53 0.51 0.53  CALCIUM 8.5* 8.5* 8.6*   LFT Recent Labs    11/06/22 0400  PROT 4.9*  ALBUMIN 2.3*  AST 9*  ALT 11  ALKPHOS 107  BILITOT 0.3   PT/INR No results for input(s): "LABPROT", "INR" in the last 72  hours.   Scheduled Meds:  (feeding supplement) PROSource Plus  30 mL Oral TID BM   alteplase  2 mg Intracatheter Once   amLODipine  5 mg Oral Daily   Chlorhexidine Gluconate Cloth  6 each Topical Daily   enoxaparin (LOVENOX) injection  40 mg Subcutaneous Q24H   pantoprazole (PROTONIX) IV  40 mg Intravenous Q12H   pneumococcal 20-valent conjugate vaccine  0.5 mL Intramuscular Tomorrow-1000   Continuous Infusions:  sodium chloride     ceFEPime (MAXIPIME) IV 2 g (11/06/22 0845)   lactated ringers 100 mL/hr at 11/05/22 1705   levETIRAcetam 500 mg (11/06/22 0855)   metronidazole 500 mg (11/06/22 13/12/23)      Patient profile:   66 year old female with history of diastolic heart failure, bradycardia, moderate malnutrition, chronic pain syndrome/rheumatoid arthritis on BC powders daily, vodka 3-6 drinks weekly for 10 years presented with acute abdominal pain 11/6, likely perforated gastric ulcer.    Impression/Plan:   Likely gastric or peptic ulcer perforation with septic shock and peritonitis which is resolved,  secondary to [facility] powder use daily and ETOH 3-6 vodka drinks x 10 years  On cefepime and metronidazole Blood cultures negative day 4 Pain management per primary Get CBC, CMET Surgery following, appreciate their consult Plan for Gastrografin UGI tomorrow, CT reassuring  Continue liquid diet, pending UGI tomorrow could consider advancing diet PPI IV twice daily No plan for endoscopic evaluation at this time Further recommendation per Dr. [facility]   Acute on chronic blood loss anemia HGB 9.1 (9.4) - steady hemoglobin No signs of acute GI bleeding Continue PPI BID BM 11/03/2022 hard stools, brown, no evidence of melena or hematochezia.  -Continue to monitor H&H with transfusion as needed to maintain hemoglobin greater than 7.  Choledocholithiasis  without LFT elevation, no cholangitis AST 9 ALT 11  Alkphos 107 TBili 0.3 Noted on CT, MRCP demonstrated 2 small stones mid duct  measuring up to 4 mm, common bile duct measures 12 mm, sludge in dilated gallbladder with pericholecystic fluid, equivocal for acute cholecystitis Likely more chronic than acute with no LFT elevation Continue to monitor, no plan on ERCP at this time No LFTs today, repeat CMET   Pancreatic ductal dilatation with pancreatic atrophy 11/01/2022 MRCP prominent pancreatic duct up to 2.5 mm, Similar appearance dating back to 2017 alcohol disorder history, still drink ETOH per patient 3-6 vodka drinks a week for last 10 years.  If any diarrhea or loose stools, consider Creon Suggest repeat MRCP with and without outpatient non emergent for evaluation   Diastolic heart failure 09/29/2022 EF 64% grade 2 diastolic mildly elevated pulmonary artery systolic pressure normal valves.   Seizure disorder On Keppra   Opioid dependence   Constipation secondary to opioid dependence  Principal Problem:   Peritonitis (HCC) Active Problems:   Sepsis due to undetermined organism (HCC)   Opiate dependence --Chronic pain   Seizure (HCC)   Sinus bradycardia   Pressure injury of skin    LOS: 6 days   Doree Albee  11/06/2022, 11:02 AM   Attending physician's note   I have taken history, reviewed the chart and examined the patient. I performed a substantive portion of this encounter, including complete performance of at least one of the key components, in conjunction with the APP. I agree with the Advanced Practitioner's note, impression and recommendations.   Likely GU perforation- CT yesterday shows improvement. No abscesses. Asymptomatic incidental small choledocholithiasis on MRCP with Nl LFTs. No asc cholangitis or pancreatitis. Chronic pain syndrome on opioids.  Plan: -Continue IV Protonix twice daily -Clear liquid diet -Gastrografin/barium UGI series tomorrow -Appreciate surgical consultation. -Not a candidate for any endoscopic procedures.  Dr. Rhea Belton taking over GI service  tomorrow   Edman Circle, MD [facility] [phone]

## 2022-11-07 ENCOUNTER — Inpatient Hospital Stay (HOSPITAL_COMMUNITY): Payer: Medicare HMO

## 2022-11-07 DIAGNOSIS — K659 Peritonitis, unspecified: Secondary | ICD-10-CM | POA: Diagnosis not present

## 2022-11-07 DIAGNOSIS — K251 Acute gastric ulcer with perforation: Secondary | ICD-10-CM | POA: Diagnosis not present

## 2022-11-07 DIAGNOSIS — A419 Sepsis, unspecified organism: Secondary | ICD-10-CM | POA: Diagnosis not present

## 2022-11-07 DIAGNOSIS — K668 Other specified disorders of peritoneum: Secondary | ICD-10-CM | POA: Diagnosis not present

## 2022-11-07 DIAGNOSIS — K8050 Calculus of bile duct without cholangitis or cholecystitis without obstruction: Secondary | ICD-10-CM

## 2022-11-07 LAB — CBC WITH DIFFERENTIAL/PLATELET
Abs Immature Granulocytes: 0.06 10*3/uL (ref 0.00–0.07)
Basophils Absolute: 0.1 10*3/uL (ref 0.0–0.1)
Basophils Relative: 1 %
Eosinophils Absolute: 0.8 10*3/uL — ABNORMAL HIGH (ref 0.0–0.5)
Eosinophils Relative: 9 %
HCT: 29.4 % — ABNORMAL LOW (ref 36.0–46.0)
Hemoglobin: 9 g/dL — ABNORMAL LOW (ref 12.0–15.0)
Immature Granulocytes: 1 %
Lymphocytes Relative: 22 %
Lymphs Abs: 1.9 10*3/uL (ref 0.7–4.0)
MCH: 24.9 pg — ABNORMAL LOW (ref 26.0–34.0)
MCHC: 30.6 g/dL (ref 30.0–36.0)
MCV: 81.2 fL (ref 80.0–100.0)
Monocytes Absolute: 0.7 10*3/uL (ref 0.1–1.0)
Monocytes Relative: 8 %
Neutro Abs: 5.2 10*3/uL (ref 1.7–7.7)
Neutrophils Relative %: 59 %
Platelets: 309 10*3/uL (ref 150–400)
RBC: 3.62 MIL/uL — ABNORMAL LOW (ref 3.87–5.11)
RDW: 15.3 % (ref 11.5–15.5)
WBC: 8.8 10*3/uL (ref 4.0–10.5)
nRBC: 0 % (ref 0.0–0.2)

## 2022-11-07 LAB — COMPREHENSIVE METABOLIC PANEL
ALT: 9 U/L (ref 0–44)
AST: 10 U/L — ABNORMAL LOW (ref 15–41)
Albumin: 2.3 g/dL — ABNORMAL LOW (ref 3.5–5.0)
Alkaline Phosphatase: 95 U/L (ref 38–126)
Anion gap: 6 (ref 5–15)
BUN: <5 mg/dL — ABNORMAL LOW (ref 8–23)
CO2: 31 mmol/L (ref 22–32)
Calcium: 8.4 mg/dL — ABNORMAL LOW (ref 8.9–10.3)
Chloride: 102 mmol/L (ref 98–111)
Creatinine, Ser: 0.55 mg/dL (ref 0.44–1.00)
GFR, Estimated: >60 mL/min (ref >60)
Glucose, Bld: 92 mg/dL (ref 70–99)
Potassium: 3.1 mmol/L — ABNORMAL LOW (ref 3.5–5.1)
Sodium: 139 mmol/L (ref 135–145)
Total Bilirubin: 0.1 mg/dL — ABNORMAL LOW (ref 0.3–1.2)
Total Protein: 5 g/dL — ABNORMAL LOW (ref 6.5–8.1)

## 2022-11-07 MED ORDER — PANTOPRAZOLE SODIUM 40 MG PO TBEC
40.0000 mg | DELAYED_RELEASE_TABLET | Freq: Two times a day (BID) | ORAL | Status: DC
  Administered 2022-11-08 – 2022-11-24 (×34): 40 mg via ORAL
  Filled 2022-11-07 (×34): qty 1

## 2022-11-07 MED ORDER — POTASSIUM CHLORIDE 20 MEQ PO PACK
40.0000 meq | PACK | Freq: Two times a day (BID) | ORAL | Status: AC
  Administered 2022-11-07 (×2): 40 meq via ORAL
  Filled 2022-11-07 (×2): qty 2

## 2022-11-07 MED ORDER — IOHEXOL 300 MG/ML  SOLN
100.0000 mL | Freq: Once | INTRAMUSCULAR | Status: AC | PRN
  Administered 2022-11-07: 100 mL via ORAL

## 2022-11-07 MED ORDER — POTASSIUM CHLORIDE IN NACL 40-0.9 MEQ/L-% IV SOLN
INTRAVENOUS | Status: AC
  Filled 2022-11-07 (×2): qty 1000

## 2022-11-07 MED ORDER — ORAL CARE MOUTH RINSE: 15.0000 mL | OROMUCOSAL | Status: DC | PRN

## 2022-11-07 NOTE — Progress Notes (Addendum)
Daily Rounding Note  11/07/2022, 1:16 PM  LOS: 7 days   SUBJECTIVE:   Chief complaint:   Referred gastric ulcer.  Dilated intra and extrahepatic bile ducts, dilated pancreatic duct.  CBD stone.  No nausea.  Still having some much milder nonfocal abdominal pain.  No nausea.  She is hungry and is tolerating clear liquids.  OBJECTIVE:         Vital signs in last 24 hours:    Temp:  [97.5 F (36.4 C)-98.7 F (37.1 C)] 97.5 F (36.4 C) (11/13 0732) Pulse Rate:  [69-84] 76 (11/13 0732) Resp:  [15-16] 16 (11/13 0732) BP: (118-138)/(61-76) 122/61 (11/13 0732) SpO2:  [96 %-100 %] 100 % (11/13 0732) Last BM Date : 11/06/22 Filed Weights   10/31/22 1738 10/31/22 2200  Weight: 59.9 kg 57.5 kg   General: Looks chronically ill.  Resting in bed but appears comfortable and is alert. Heart: RRR. Chest: No labored breathing or cough. Abdomen: Soft.  Diffuse minor tenderness without guarding or rebound.  No organomegaly, hernias, masses.  Bowel sounds active.  No distention Extremities: Arms and legs are thin without edema. Neuro/Psych: Fully alert and oriented x3.  Affect is a bit flat and she appears mildly depressed but is pleasant, cooperative and speech is fluid.  Intake/Output from previous day: 11/12 0701 - 11/13 0700 In: 1864.6 [P.O.:120; I.V.:344.6; IV Piggyback:1400] Out: 1100 [Urine:1100]  Intake/Output this shift: Total I/O In: -  Out: 650 [Urine:650]  Lab Results: Recent Labs    11/05/22 1610 11/06/22 0400 11/07/22 0505  WBC 7.9 7.3 8.8  HGB 9.4* 9.1* 9.0*  HCT 30.6* 30.1* 29.4*  PLT 309 284 309   BMET Recent Labs    11/05/22 1610 11/06/22 0400 11/07/22 0505  NA 136 138 139  K 3.4* 3.2* 3.1*  CL 100 101 102  CO2 31 30 31   GLUCOSE 92 92 92  BUN <5* <5* <5*  CREATININE 0.51 0.53 0.55  CALCIUM 8.5* 8.6* 8.4*   LFT Recent Labs    11/05/22 1610 11/06/22 0400 11/07/22 0505  PROT 5.1* 4.9* 5.0*   ALBUMIN 2.4* 2.3* 2.3*  AST 9* 9* 10*  ALT 9 11 9   ALKPHOS 112 107 95  BILITOT 0.2* 0.3 0.1*   PT/INR No results for input(s): "LABPROT", "INR" in the last 72 hours. Hepatitis Panel No results for input(s): "HEPBSAG", "HCVAB", "HEPAIGM", "HEPBIGM" in the last 72 hours.  Studies/Results: DG UGI W SINGLE CM (SOL OR THIN BA)  Result Date: 11/07/2022 CLINICAL DATA:  Evaluation for perforated gastric ulcer. Exam is limited due to patient's immobility. EXAM: DG UGI W SINGLE CM TECHNIQUE: Scout radiograph was obtained. Single contrast examination was performed using thin liquid barium. This exam was performed by , PA-C, and was supervised and interpreted by 11/09/2022, MD. FLUOROSCOPY: Radiation Exposure Index (as provided by the fluoroscopic device): 16.3 mGy Kerma COMPARISON:  CT abdomen and pelvis done November 05, 2022 FINDINGS: Limited upper GI exam due to patient condition. Scout radiograph demonstrates no evidence of bowel obstruction and prior lumbosacral fusion. Normal appearance of the distal esophagus. No gastroesophageal reflux occurred during the exam. No hiatal hernia. There is no evidence of extraluminal contrast leak. Normal appearance of the duodenum. IMPRESSION: Limited upper GI exam due to patient condition. No evidence of extraluminal contrast leak on limited views. Procedure performed by: Caprice Renshaw, PA-C Electronically Signed   By: November 07, 2022 M.D.   On: 11/07/2022 09:57  CT ABDOMEN PELVIS W CONTRAST  Result Date: 11/05/2022 CLINICAL DATA:  Acute abdominal pain, free intraperitoneal gas. Gastric perforation EXAM: CT ABDOMEN AND PELVIS WITH CONTRAST TECHNIQUE: Multidetector CT imaging of the abdomen and pelvis was performed using the standard protocol following bolus administration of intravenous contrast. RADIATION DOSE REDUCTION: This exam was performed according to the departmental dose-optimization program which includes automated exposure control, adjustment of  the mA and/or kV according to patient size and/or use of iterative reconstruction technique. CONTRAST:  17mL OMNIPAQUE IOHEXOL 350 MG/ML SOLN COMPARISON:  10/31/2022 FINDINGS: Lower chest: Small bilateral pleural effusions have developed with associated bibasilar compressive atelectasis. Cardiac size within normal limits. No pericardial effusion. Hepatobiliary: The liver is unremarkable. Previously noted intra and extrahepatic biliary ductal dilation has resolved. The gallbladder is unremarkable. Previously noted gallbladder distension has resolved. Pancreas: Borderline enlargement of the main pancreatic duct is again identified to the level of the ampulla where this measures 5 mm in diameter. No superimposed peripancreatic inflammatory changes are identified. No discrete pancreatic mass is seen. Spleen: Unremarkable Adrenals/Urinary Tract: Adrenal glands are unremarkable. Kidneys are normal, without renal calculi, focal lesion, or hydronephrosis. Bladder is unremarkable. Stomach/Bowel: Mild ascites has developed. Asymmetric wall thickening involving the gastric antrum anteriorly, best appreciated at axial image # 27/3 and sagittal image # 56-64 is again identified raising the question of a focal inflammatory process as can be seen peptic ulcer disease. Previously noted punctate perigastric free intraperitoneal gas has resolved. No evidence of obstruction. The small and large bowel are unremarkable save for mild sigmoid diverticulosis. The appendix is unremarkable. Vascular/Lymphatic: Mild aortoiliac atherosclerotic calcification. No aortic aneurysm. No pathologic adenopathy within the abdomen and pelvis. Reproductive: Uterus and bilateral adnexa are unremarkable. Other: Moderate diffuse subcutaneous body wall edema and mild infiltration within the retroperitoneum extending into the pelvis in keeping with retroperitoneal edema is present in keeping with changes of developing anasarca. Tiny fat containing umbilical  hernia again noted. Musculoskeletal: L5-S1 posterior lumbar fusion and decompression has been performed. Osseous structures are otherwise age-appropriate. IMPRESSION: 1. Interval development of small bilateral pleural effusions, mild ascites, and retroperitoneal edema in keeping with developing anasarca. 2. Asymmetric wall thickening involving the gastric antrum anteriorly raising the question of a focal inflammatory process as can be seen with peptic ulcer disease. Previously noted punctate perigastric free intraperitoneal gas has resolved. Mild ascites has developed, however, though this may simply represent a component of developing anasarca. Correlation with endoscopy may be helpful for further evaluation. 3. Interval resolution of intra and extrahepatic biliary ductal dilation and gallbladder distension. 4. Stable borderline enlargement of the main pancreatic duct is again identified to the level of the ampulla, better assessed on prior MRI examination of 11/01/2022. No superimposed peripancreatic inflammatory changes are identified. 5. Mild sigmoid diverticulosis. 6.  Aortic Atherosclerosis (ICD10-I70.0). Electronically Signed   By: Helyn Numbers M.D.   On: 11/05/2022 19:57    Scheduled Meds:  alteplase  2 mg Intracatheter Once   amLODipine  5 mg Oral Daily   Chlorhexidine Gluconate Cloth  6 each Topical Daily   enoxaparin (LOVENOX) injection  40 mg Subcutaneous Q24H   pantoprazole (PROTONIX) IV  40 mg Intravenous Q12H   potassium chloride  40 mEq Oral BID   sucralfate  1 g Oral TID WC & HS   Continuous Infusions:  0.9 % NaCl with KCl 40 mEq / L 50 mL/hr at 11/07/22 1252   ceFEPime (MAXIPIME) IV 2 g (11/07/22 1109)   levETIRAcetam 500 mg (11/07/22 0828)  metronidazole 500 mg (11/07/22 0830)   PRN Meds:.acetaminophen **OR** acetaminophen, ALPRAZolam, hydrALAZINE, HYDROmorphone (DILAUDID) injection, ondansetron **OR** ondansetron (ZOFRAN) IV, mouth rinse, oxyCODONE  ASSESMENT:   Perforated  gastric ulcer with septic shock, peritonitis.  CT scan of yesterday shows improvement with resolved intraperitoneal gas.  Upper GI series today was limited due to patient's inability to obtain optimal positioning but reveals no evidence for contrast extravasation/leak from the ulcer.  At home, PTA, was only taking Protonix 40 mg prn.    Small, non-obstructing choledocholithiasis son MRCP.  Elevated LFTs of last week have normalized.  No associated cholangitis or pancreatitis on imaging.  Dilated intra and extrahepatic biliary ducts and main pancreatic duct.  CT scan of 11/11 shows resolution of the ductal dilatation in the intra and extrahepatic ducts and stable borderline dilation of main pancreatic duct.  CT does not demonstrate changes of pancreatitis.  ? Passed CBD stone?   Chronic pain syndrome, rheumatoid arthritis/joint pain, migraine headaches.  Chronic aspirin powder use PTA. Now on   Acute on chronic blood loss anemia likely secondary to ulcer.  Hgb 9.  MCV 81.    ETOH use disorder.  Still actively drinking PTA.  CBD prominence noted on MRI/MRCP is stable and dates back to at least 2017.   PLAN     Switch Protonix from IV to po starting 11/14.  Duration of abx??, will defer to surgery.    Ordered a heart healthy diet.  Patient gives a history of not tolerating fatty foods well.  Patient now aware that she needs to avoid ASA powders.    Stool testing for H. Pylori.      As outpt should fup w Dr Marina Goodell or APP for consideration EGD, ERCP.     Jamie [name]  11/07/2022, 1:16 PM Phone [phone]

## 2022-11-07 NOTE — Progress Notes (Signed)
Pt is requesting that daughter will no longer be contacted about her medical information. She rather her aunt Kathie Rhodes to be contacted, her contact information is on file.

## 2022-11-07 NOTE — Progress Notes (Signed)
Subjective: CC: Reports LUQ pain radiating to her lower abdomen. Slightly worse then yesterday. 10/10. No RUQ pain. Nausea. No vomiting. Passing flatus. 2 loose stools yesterday. No bloody stools.   Reports quit drinking earlier this use. Used bc powders daily for pain and ha's. Doesn't smoke. Reports chronic pain and uses oxy 10mg  QID.   Objective: Vital signs in last 24 hours: Temp:  [97.5 F (36.4 C)-98.7 F (37.1 C)] 97.5 F (36.4 C) (11/13 0732) Pulse Rate:  [69-84] 76 (11/13 0732) Resp:  [15-16] 16 (11/13 0732) BP: (118-138)/(61-76) 122/61 (11/13 0732) SpO2:  [96 %-100 %] 100 % (11/13 0732) Last BM Date : 11/06/22  Intake/Output from previous day: 11/12 0701 - 11/13 0700 In: 1864.6 [P.O.:120; I.V.:344.6; IV Piggyback:1400] Out: 1100 [Urine:1100] Intake/Output this shift: No intake/output data recorded.  PE: Gen:  Alert, NAD, pleasant Abd: Soft, mild distension, generalized ttp greatest on the left. No rigidity or guarding. +BS  Lab Results:  Recent Labs    11/06/22 0400 11/07/22 0505  WBC 7.3 8.8  HGB 9.1* 9.0*  HCT 30.1* 29.4*  PLT 284 309   BMET Recent Labs    11/06/22 0400 11/07/22 0505  NA 138 139  K 3.2* 3.1*  CL 101 102  CO2 30 31  GLUCOSE 92 92  BUN <5* <5*  CREATININE 0.53 0.55  CALCIUM 8.6* 8.4*   PT/INR No results for input(s): "LABPROT", "INR" in the last 72 hours. CMP     Component Value Date/Time   NA 139 11/07/2022 0505   K 3.1 (L) 11/07/2022 0505   CL 102 11/07/2022 0505   CO2 31 11/07/2022 0505   GLUCOSE 92 11/07/2022 0505   BUN <5 (L) 11/07/2022 0505   CREATININE 0.55 11/07/2022 0505   CALCIUM 8.4 (L) 11/07/2022 0505   PROT 5.0 (L) 11/07/2022 0505   ALBUMIN 2.3 (L) 11/07/2022 0505   AST 10 (L) 11/07/2022 0505   ALT 9 11/07/2022 0505   ALKPHOS 95 11/07/2022 0505   BILITOT 0.1 (L) 11/07/2022 0505   GFRNONAA >60 11/07/2022 0505   GFRAA >60 07/04/2020 1636   Lipase     Component Value Date/Time   LIPASE 24  09/29/2022 0024    Studies/Results: CT ABDOMEN PELVIS W CONTRAST  Result Date: 11/05/2022 CLINICAL DATA:  Acute abdominal pain, free intraperitoneal gas. Gastric perforation EXAM: CT ABDOMEN AND PELVIS WITH CONTRAST TECHNIQUE: Multidetector CT imaging of the abdomen and pelvis was performed using the standard protocol following bolus administration of intravenous contrast. RADIATION DOSE REDUCTION: This exam was performed according to the departmental dose-optimization program which includes automated exposure control, adjustment of the mA and/or kV according to patient size and/or use of iterative reconstruction technique. CONTRAST:  4mL OMNIPAQUE IOHEXOL 350 MG/ML SOLN COMPARISON:  10/31/2022 FINDINGS: Lower chest: Small bilateral pleural effusions have developed with associated bibasilar compressive atelectasis. Cardiac size within normal limits. No pericardial effusion. Hepatobiliary: The liver is unremarkable. Previously noted intra and extrahepatic biliary ductal dilation has resolved. The gallbladder is unremarkable. Previously noted gallbladder distension has resolved. Pancreas: Borderline enlargement of the main pancreatic duct is again identified to the level of the ampulla where this measures 5 mm in diameter. No superimposed peripancreatic inflammatory changes are identified. No discrete pancreatic mass is seen. Spleen: Unremarkable Adrenals/Urinary Tract: Adrenal glands are unremarkable. Kidneys are normal, without renal calculi, focal lesion, or hydronephrosis. Bladder is unremarkable. Stomach/Bowel: Mild ascites has developed. Asymmetric wall thickening involving the gastric antrum anteriorly, best appreciated at axial image #  27/3 and sagittal image # 56-64 is again identified raising the question of a focal inflammatory process as can be seen peptic ulcer disease. Previously noted punctate perigastric free intraperitoneal gas has resolved. No evidence of obstruction. The small and large  bowel are unremarkable save for mild sigmoid diverticulosis. The appendix is unremarkable. Vascular/Lymphatic: Mild aortoiliac atherosclerotic calcification. No aortic aneurysm. No pathologic adenopathy within the abdomen and pelvis. Reproductive: Uterus and bilateral adnexa are unremarkable. Other: Moderate diffuse subcutaneous body wall edema and mild infiltration within the retroperitoneum extending into the pelvis in keeping with retroperitoneal edema is present in keeping with changes of developing anasarca. Tiny fat containing umbilical hernia again noted. Musculoskeletal: L5-S1 posterior lumbar fusion and decompression has been performed. Osseous structures are otherwise age-appropriate. IMPRESSION: 1. Interval development of small bilateral pleural effusions, mild ascites, and retroperitoneal edema in keeping with developing anasarca. 2. Asymmetric wall thickening involving the gastric antrum anteriorly raising the question of a focal inflammatory process as can be seen with peptic ulcer disease. Previously noted punctate perigastric free intraperitoneal gas has resolved. Mild ascites has developed, however, though this may simply represent a component of developing anasarca. Correlation with endoscopy may be helpful for further evaluation. 3. Interval resolution of intra and extrahepatic biliary ductal dilation and gallbladder distension. 4. Stable borderline enlargement of the main pancreatic duct is again identified to the level of the ampulla, better assessed on prior MRI examination of 11/01/2022. No superimposed peripancreatic inflammatory changes are identified. 5. Mild sigmoid diverticulosis. 6.  Aortic Atherosclerosis (ICD10-I70.0). Electronically Signed   By: Helyn Numbers M.D.   On: 11/05/2022 19:57    Anti-infectives: Anti-infectives (From admission, onward)    Start     Dose/Rate Route Frequency Ordered Stop   11/07/22 0800  metroNIDAZOLE (FLAGYL) IVPB 500 mg        500 mg 100 mL/hr  over 60 Minutes Intravenous Every 12 hours 11/06/22 2001     11/07/22 0300  ceFEPIme (MAXIPIME) 2 g in sodium chloride 0.9 % 100 mL IVPB        2 g 200 mL/hr over 30 Minutes Intravenous Every 8 hours 11/06/22 1948     11/02/22 1000  ceFEPIme (MAXIPIME) 2 g in sodium chloride 0.9 % 100 mL IVPB  Status:  Discontinued        2 g 200 mL/hr over 30 Minutes Intravenous Every 8 hours 11/02/22 0754 11/06/22 1908   11/01/22 0600  metroNIDAZOLE (FLAGYL) IVPB 500 mg  Status:  Discontinued        500 mg 100 mL/hr over 60 Minutes Intravenous Every 12 hours 10/31/22 1747 11/06/22 1859   10/31/22 2200  ceFEPIme (MAXIPIME) 2 g in sodium chloride 0.9 % 100 mL IVPB  Status:  Discontinued        2 g 200 mL/hr over 30 Minutes Intravenous Every 12 hours 10/31/22 1757 11/02/22 0754   10/31/22 1600  metroNIDAZOLE (FLAGYL) IVPB 500 mg        500 mg 100 mL/hr over 60 Minutes Intravenous  Once 10/31/22 1546 10/31/22 1759   10/31/22 1445  ceFEPIme (MAXIPIME) 2 g in sodium chloride 0.9 % 100 mL IVPB        2 g 200 mL/hr over 30 Minutes Intravenous  Once 10/31/22 1440 10/31/22 1643        Assessment/Plan Possible perforated gastric ulcer - Noted on initial scan at [facility] 11/6 with foci of free air in the upper abdomen with wall thickening of both the gastric cardia and distal stomach  concerning for gastric perforation.  Transferred to [facility] for choledocholithiasis.  Repeat CT 11/11 with asymmetric wall thickening of the gastric antrum but resolution of free intraperitoneal gas - UGI today to ensure no ongoing leak. If this is negative for leak, okay with cld today - Cont PPI BID (and Carafate if able to take po) - Cont Cefepime/flagyl. Her wbc is wnl and she is HDS without fever, tachycardia or hypotension. Consider adding fluconazole if any of this was to change.  - We will follow.   Choledocholithiasis  - Noted on MRCP w/ 2 small stones mid duct measuring up to 4 mm, common bile duct measures 12 mm. GI has evaluated.  Not planning ERCP w/ possible perforated gastric ulcer. They suspect this is more chronic then acute with no elevated of LFT's. - MRCP also did note sludge in gallbladder with pericholecystitic fluid. Question if pericholecystitic fluid was actually from perf ulcer. GB was reassuring on repeat CT 11/11. She is already on abx. I do not think she needs any surgery or a perc chole at this time.   FEN - NPO for now. See above. IVF per primary VTE - SCDs, okay for chem ppx from a general surgery standpoint ID - Cefepime/Flagyl   Pancreatic duct dilation - GI following Chronic anemia Hx seizure disorder Opiate dependence    LOS: 7 days    Jamie [name] , [facility] 11/07/2022, 8:24 AM Please see Amion for pager number during day hours 7:00am-4:30pm

## 2022-11-07 NOTE — Progress Notes (Addendum)
[facility] CONSULT PROGRESS NOTE    Progress Note  Jamie [name]  XTG:[id number] DOB: [birth year] DOA: 10/31/2022 PCP: Barbie Banner, MD     Brief Narrative:   Jamie [name] is an 66 y.o. female past medical history of anxiety alcohol abuse in remission, chronic back pain, history of seizure disorder and discharged from the hospital on October 04, 2022 she was treated for sepsis secondary to pneumonia treated with pressors hospital stay was complicated by seizures comes in again for right upper quadrant abdominal pain that began on 10/29/2022 in the ED CT was done that showed free air in the abdomen, was found to be shock in the perforated viscus and peritonitis, surgery was consulted.  CT also showed intrahepatic and extrahepatic duct dilation with a CBD stone and pancreatic atrophy with pancreatic duct dilation she was started empirically on cefepime and Flagyl.      Assessment/Plan:   Septic shock secondary to peritonitis due to perforated peptic ulcer in the setting of BC powder use: Sepsis now resolved. Off pressors, continue cefepime per surgery and Flagyl. GI has been consulted plan for Gastrografin that showed no contrast extravasation. Further management per GI and surgery.  Acute perforated peptic ulcer disease peritonitis (HCC) Secondary to microperforation. Patient has been using Goody powder due to rheumatoid arthritis. General surgery felt microperforation has sealed. CT scan 11/05/2022 shows small bilateral effusion retroperitoneal edema with gastric antrum wall thickening concern about inflammatory process.  Free air has resolved.  There is mild ascites and some mild anasarca developing. Further management per surgery.  Continue cefepime and Flagyl per surgery.  Choledocholithiasis biliary extraperitoneal hepatic ductal dilation: MRCP showed ductal dilation to small stone measuring 4 mm with mildly dilated gallbladder and pericholecystic fluid to go to go for acute  cholecystitis. Likely chronic than acute elevation of LFTs. Recommended to continue to monitor LFTs no ERCP at this time. LFTs continue to improve.  Pancreatic ductal dilation: Her CBC was done that showed similar appearing pancreatic duct in 2017 unchanged from previous.  Acute on chronic blood loss anemia No signs of overt bleeding. Continue PPI twice a day. Patient relates hard stools 3 days ago. Hemoglobin has remained stable today is 9.0.  History of seizure disorder: Unprovoked continue IV Keppra for now.  Sinus bradycardia: Has resolved.  Opiate dependence: Continue current regimen pain seems to be controlled.  Anxiety: Continue Xanax 3 times a day seems to be stable.  Hypokalemia: Continue replete IV recheck in the morning.  Currently trending down we will give 1 dose of oral.  Sacral cubitus ulcer stage II present on admission RN Pressure Injury Documentation: Pressure Injury 10/31/22 Sacrum Medial Stage 2 -  Partial thickness loss of dermis presenting as a shallow open injury with a red, pink wound bed without slough. (Active)  10/31/22 2322  Location: Sacrum  Location Orientation: Medial  Staging: Stage 2 -  Partial thickness loss of dermis presenting as a shallow open injury with a red, pink wound bed without slough.  Wound Description (Comments):   Present on Admission: Yes  Dressing Type Foam - Lift dressing to assess site every shift 11/06/22 2037    DVT prophylaxis: lovenox Family Communication:non4 Status is: Inpatient Remains inpatient appropriate because: Acute perforated peptic ulcer disease with peritonitis    Code Status:     Code Status Orders  (From admission, onward)           Start     Ordered   10/31/22 1734  Full  code  Continuous        10/31/22 1747           Code Status History     Date Active Date Inactive Code Status Order ID Comments User Context   09/29/2022 0431 10/04/2022 2211 Full Code [id number]  Rhae Lerner ED   07/27/2022 2304 07/30/2022 2000 Full Code [id number]  Rolla Plate, DO Inpatient   07/18/2022 2114 07/23/2022 0012 Full Code [id number]  Roxan Hockey, MD ED   09/21/2016 0441 09/21/2016 1627 Full Code [id number]  Rolland Porter, MD ED   05/08/2016 2307 05/10/2016 1521 Full Code [id number]  Opyd, Ilene Qua, MD ED         IV Access:   Peripheral IV   Procedures and diagnostic studies:   DG UGI W SINGLE CM (SOL OR THIN BA)  Result Date: 11/07/2022 CLINICAL DATA:  Evaluation for perforated gastric ulcer. Exam is limited due to patient's immobility. EXAM: DG UGI W SINGLE CM TECHNIQUE: Scout radiograph was obtained. Single contrast examination was performed using thin liquid barium. This exam was performed by Gareth Eagle, PA-C, and was supervised and interpreted by Maurine Simmering, MD. FLUOROSCOPY: Radiation Exposure Index (as provided by the fluoroscopic device): 16.3 mGy Kerma COMPARISON:  CT abdomen and pelvis done November 05, 2022 FINDINGS: Limited upper GI exam due to patient condition. Scout radiograph demonstrates no evidence of bowel obstruction and prior lumbosacral fusion. Normal appearance of the distal esophagus. No gastroesophageal reflux occurred during the exam. No hiatal hernia. There is no evidence of extraluminal contrast leak. Normal appearance of the duodenum. IMPRESSION: Limited upper GI exam due to patient condition. No evidence of extraluminal contrast leak on limited views. Procedure performed by: Gareth Eagle, PA-C Electronically Signed   By: Maurine Simmering M.D.   On: 11/07/2022 09:57   CT ABDOMEN PELVIS W CONTRAST  Result Date: 11/05/2022 CLINICAL DATA:  Acute abdominal pain, free intraperitoneal gas. Gastric perforation EXAM: CT ABDOMEN AND PELVIS WITH CONTRAST TECHNIQUE: Multidetector CT imaging of the abdomen and pelvis was performed using the standard protocol following bolus administration of intravenous contrast. RADIATION DOSE REDUCTION: This exam was  performed according to the departmental dose-optimization program which includes automated exposure control, adjustment of the mA and/or kV according to patient size and/or use of iterative reconstruction technique. CONTRAST:  25mL OMNIPAQUE IOHEXOL 350 MG/ML SOLN COMPARISON:  10/31/2022 FINDINGS: Lower chest: Small bilateral pleural effusions have developed with associated bibasilar compressive atelectasis. Cardiac size within normal limits. No pericardial effusion. Hepatobiliary: The liver is unremarkable. Previously noted intra and extrahepatic biliary ductal dilation has resolved. The gallbladder is unremarkable. Previously noted gallbladder distension has resolved. Pancreas: Borderline enlargement of the main pancreatic duct is again identified to the level of the ampulla where this measures 5 mm in diameter. No superimposed peripancreatic inflammatory changes are identified. No discrete pancreatic mass is seen. Spleen: Unremarkable Adrenals/Urinary Tract: Adrenal glands are unremarkable. Kidneys are normal, without renal calculi, focal lesion, or hydronephrosis. Bladder is unremarkable. Stomach/Bowel: Mild ascites has developed. Asymmetric wall thickening involving the gastric antrum anteriorly, best appreciated at axial image # 27/3 and sagittal image # 56-64 is again identified raising the question of a focal inflammatory process as can be seen peptic ulcer disease. Previously noted punctate perigastric free intraperitoneal gas has resolved. No evidence of obstruction. The small and large bowel are unremarkable save for mild sigmoid diverticulosis. The appendix is unremarkable. Vascular/Lymphatic: Mild aortoiliac atherosclerotic calcification. No aortic aneurysm. No pathologic adenopathy within the abdomen and  pelvis. Reproductive: Uterus and bilateral adnexa are unremarkable. Other: Moderate diffuse subcutaneous body wall edema and mild infiltration within the retroperitoneum extending into the pelvis in  keeping with retroperitoneal edema is present in keeping with changes of developing anasarca. Tiny fat containing umbilical hernia again noted. Musculoskeletal: L5-S1 posterior lumbar fusion and decompression has been performed. Osseous structures are otherwise age-appropriate. IMPRESSION: 1. Interval development of small bilateral pleural effusions, mild ascites, and retroperitoneal edema in keeping with developing anasarca. 2. Asymmetric wall thickening involving the gastric antrum anteriorly raising the question of a focal inflammatory process as can be seen with peptic ulcer disease. Previously noted punctate perigastric free intraperitoneal gas has resolved. Mild ascites has developed, however, though this may simply represent a component of developing anasarca. Correlation with endoscopy may be helpful for further evaluation. 3. Interval resolution of intra and extrahepatic biliary ductal dilation and gallbladder distension. 4. Stable borderline enlargement of the main pancreatic duct is again identified to the level of the ampulla, better assessed on prior MRI examination of 11/01/2022. No superimposed peripancreatic inflammatory changes are identified. 5. Mild sigmoid diverticulosis. 6.  Aortic Atherosclerosis (ICD10-I70.0). Electronically Signed   By: Fidela Salisbury M.D.   On: 11/05/2022 19:57     Medical Consultants:   None.   Subjective:    Jamie [name] she is complaining of pain and relates that her pain medication is now being brought in in time.  Objective:    Vitals:   11/06/22 0801 11/06/22 1709 11/07/22 0649 11/07/22 0732  BP: 137/80 138/72 118/76 122/61  Pulse: 79 84 69 76  Resp: 16 15 16 16   Temp: 98.1 F (36.7 C) 98.7 F (37.1 C) 98 F (36.7 C) (!) 97.5 F (36.4 C)  TempSrc: Oral Oral Oral Oral  SpO2: 91% 97% 96% 100%  Weight:      Height:       SpO2: 100 % O2 Flow Rate (L/min): 2 L/min   Intake/Output Summary (Last 24 hours) at 11/07/2022 1029 Last data filed  at 11/07/2022 0831 Gross per 24 hour  Intake 1864.58 ml  Output 1450 ml  Net 414.58 ml   Filed Weights   10/31/22 1738 10/31/22 2200  Weight: 59.9 kg 57.5 kg    Exam: General exam: In no acute distress. Respiratory system: Good air movement and clear to auscultation. Cardiovascular system: S1 & S2 heard, RRR. No JVD. Gastrointestinal system: Abdomen is nondistended, soft and nontender.  Extremities: No pedal edema. Skin: No rashes, lesions or ulcers Psychiatry: Judgement and insight appear normal. Mood & affect appropriate.    Data Reviewed:    Labs: Basic Metabolic Panel: Recent Labs  Lab 11/01/22 0336 11/02/22 0358 11/04/22 1143 11/05/22 1610 11/06/22 0400 11/07/22 0505  NA 135 134* 138 136 138 139  K 3.8 3.9 3.1* 3.4* 3.2* 3.1*  CL 113* 106 100 100 101 102  CO2 18* 21* 29 31 30 31   GLUCOSE 90 114* 102* 92 92 92  BUN 16 10 <5* <5* <5* <5*  CREATININE 0.88 0.60 0.53 0.51 0.53 0.55  CALCIUM 8.3* 9.0 8.5* 8.5* 8.6* 8.4*  MG 2.1 1.9  --   --   --   --   PHOS 3.0  --   --   --   --   --    GFR Estimated Creatinine Clearance: 61.1 mL/min (by C-G formula based on SCr of 0.55 mg/dL). Liver Function Tests: Recent Labs  Lab 11/02/22 0358 11/04/22 1143 11/05/22 1610 11/06/22 0400 11/07/22 0505  AST  21 10* 9* 9* 10*  ALT 33 13 9 11 9   ALKPHOS 193* 113 112 107 95  BILITOT 0.4 0.2* 0.2* 0.3 0.1*  PROT 6.5 4.9* 5.1* 4.9* 5.0*  ALBUMIN 3.1* 2.3* 2.4* 2.3* 2.3*   No results for input(s): "LIPASE", "AMYLASE" in the last 168 hours. No results for input(s): "AMMONIA" in the last 168 hours. Coagulation profile No results for input(s): "INR", "PROTIME" in the last 168 hours. COVID-19 Labs  No results for input(s): "DDIMER", "FERRITIN", "LDH", "CRP" in the last 72 hours.  Lab Results  Component Value Date   SARSCOV2NAA NEGATIVE 05/04/2022   [place] NEGATIVE 05/19/2021    CBC: Recent Labs  Lab 10/31/22 1317 11/01/22 0336 11/02/22 0358 11/04/22 1143  11/05/22 1610 11/06/22 0400 11/07/22 0505  WBC 29.7*   < > 20.8* 6.7 7.9 7.3 8.8  NEUTROABS 27.7*  --   --   --  4.6 4.4 5.2  HGB 11.4*   < > 10.7* 9.5* 9.4* 9.1* 9.0*  HCT 37.3   < > 34.7* 31.4* 30.6* 30.1* 29.4*  MCV 83.3   < > 81.1 83.1 82.0 82.2 81.2  PLT 461*   < > 533* 301 309 284 309   < > = values in this interval not displayed.   Cardiac Enzymes: No results for input(s): "CKTOTAL", "CKMB", "CKMBINDEX", "TROPONINI" in the last 168 hours. BNP (last 3 results) No results for input(s): "PROBNP" in the last 8760 hours. CBG: No results for input(s): "GLUCAP" in the last 168 hours. D-Dimer: No results for input(s): "DDIMER" in the last 72 hours. Hgb A1c: No results for input(s): "HGBA1C" in the last 72 hours. Lipid Profile: No results for input(s): "CHOL", "HDL", "LDLCALC", "TRIG", "CHOLHDL", "LDLDIRECT" in the last 72 hours. Thyroid function studies: No results for input(s): "TSH", "T4TOTAL", "T3FREE", "THYROIDAB" in the last 72 hours.  Invalid input(s): "FREET3" Anemia work up: No results for input(s): "VITAMINB12", "FOLATE", "FERRITIN", "TIBC", "IRON", "RETICCTPCT" in the last 72 hours. Sepsis Labs: Recent Labs  Lab 10/31/22 1317 10/31/22 1713 11/01/22 0336 11/04/22 1143 11/05/22 1610 11/06/22 0400 11/07/22 0505  WBC 29.7*  --    < > 6.7 7.9 7.3 8.8  LATICACIDVEN 2.0* 2.1*  --   --   --   --   --    < > = values in this interval not displayed.   Microbiology Recent Results (from the past 240 hour(s))  Blood culture (routine x 2)     Status: None   Collection Time: 10/31/22  2:57 PM   Specimen: BLOOD LEFT HAND  Result Value Ref Range Status   Specimen Description BLOOD LEFT HAND  Final   Special Requests   Final    BOTTLES DRAWN AEROBIC AND ANAEROBIC Blood Culture adequate volume   Culture   Final    NO GROWTH 5 DAYS Performed at [facility], [address], [place], [place] [address]    Report Status 11/05/2022 FINAL  Final  Blood culture (routine x 2)      Status: None   Collection Time: 10/31/22  2:57 PM   Specimen: BLOOD RIGHT HAND  Result Value Ref Range Status   Specimen Description BLOOD RIGHT HAND  Final   Special Requests   Final    BOTTLES DRAWN AEROBIC ONLY Blood Culture results may not be optimal due to an inadequate volume of blood received in culture bottles   Culture   Final    NO GROWTH 5 DAYS Performed at [facility], [address],  [place], [place] [address]    Report Status 11/05/2022 FINAL  Final  MRSA Next Gen by PCR, Nasal     Status: None   Collection Time: 10/31/22 10:06 PM   Specimen: Nasal Mucosa; Nasal Swab  Result Value Ref Range Status   MRSA by PCR Next Gen NOT DETECTED NOT DETECTED Final    Comment: (NOTE) The GeneXpert MRSA Assay (FDA approved for NASAL specimens only), is one component of a comprehensive MRSA colonization surveillance program. It is not intended to diagnose MRSA infection nor to guide or monitor treatment for MRSA infections. Test performance is not FDA approved in patients less than 13 years old. Performed at [facility], [address] [place], [place] [address]      Medications:    alteplase  2 mg Intracatheter Once   amLODipine  5 mg Oral Daily   Chlorhexidine Gluconate Cloth  6 each Topical Daily   enoxaparin (LOVENOX) injection  40 mg Subcutaneous Q24H   pantoprazole (PROTONIX) IV  40 mg Intravenous Q12H   sucralfate  1 g Oral TID WC & HS   Continuous Infusions:  sodium chloride 50 mL/hr at 11/07/22 0414   ceFEPime (MAXIPIME) IV Stopped (11/07/22 0329)   levETIRAcetam 500 mg (11/07/22 0828)   metronidazole 500 mg (11/07/22 0830)      LOS: 7 days   Charlynne Cousins  [facility]  11/07/2022, 10:29 AM

## 2022-11-08 ENCOUNTER — Encounter: Payer: Self-pay | Admitting: Neurology

## 2022-11-08 ENCOUNTER — Ambulatory Visit: Payer: No Typology Code available for payment source | Admitting: Neurology

## 2022-11-08 DIAGNOSIS — K659 Peritonitis, unspecified: Secondary | ICD-10-CM | POA: Diagnosis not present

## 2022-11-08 DIAGNOSIS — G8929 Other chronic pain: Secondary | ICD-10-CM

## 2022-11-08 DIAGNOSIS — K251 Acute gastric ulcer with perforation: Secondary | ICD-10-CM | POA: Diagnosis not present

## 2022-11-08 DIAGNOSIS — K8050 Calculus of bile duct without cholangitis or cholecystitis without obstruction: Secondary | ICD-10-CM | POA: Diagnosis not present

## 2022-11-08 LAB — COMPREHENSIVE METABOLIC PANEL
ALT: 7 U/L (ref 0–44)
AST: 11 U/L — ABNORMAL LOW (ref 15–41)
Albumin: 2.3 g/dL — ABNORMAL LOW (ref 3.5–5.0)
Alkaline Phosphatase: 106 U/L (ref 38–126)
Anion gap: 3 — ABNORMAL LOW (ref 5–15)
BUN: 7 mg/dL — ABNORMAL LOW (ref 8–23)
CO2: 30 mmol/L (ref 22–32)
Calcium: 8.4 mg/dL — ABNORMAL LOW (ref 8.9–10.3)
Chloride: 104 mmol/L (ref 98–111)
Creatinine, Ser: 0.62 mg/dL (ref 0.44–1.00)
GFR, Estimated: >60 mL/min (ref >60)
Glucose, Bld: 102 mg/dL — ABNORMAL HIGH (ref 70–99)
Potassium: 4.1 mmol/L (ref 3.5–5.1)
Sodium: 137 mmol/L (ref 135–145)
Total Bilirubin: 0.3 mg/dL (ref 0.3–1.2)
Total Protein: 4.9 g/dL — ABNORMAL LOW (ref 6.5–8.1)

## 2022-11-08 LAB — CBC WITH DIFFERENTIAL/PLATELET
Abs Immature Granulocytes: 0.06 10*3/uL (ref 0.00–0.07)
Basophils Absolute: 0.1 10*3/uL (ref 0.0–0.1)
Basophils Relative: 1 %
Eosinophils Absolute: 0.7 10*3/uL — ABNORMAL HIGH (ref 0.0–0.5)
Eosinophils Relative: 9 %
HCT: 29.4 % — ABNORMAL LOW (ref 36.0–46.0)
Hemoglobin: 8.7 g/dL — ABNORMAL LOW (ref 12.0–15.0)
Immature Granulocytes: 1 %
Lymphocytes Relative: 27 %
Lymphs Abs: 2 10*3/uL (ref 0.7–4.0)
MCH: 24.8 pg — ABNORMAL LOW (ref 26.0–34.0)
MCHC: 29.6 g/dL — ABNORMAL LOW (ref 30.0–36.0)
MCV: 83.8 fL (ref 80.0–100.0)
Monocytes Absolute: 0.6 10*3/uL (ref 0.1–1.0)
Monocytes Relative: 8 %
Neutro Abs: 4.2 10*3/uL (ref 1.7–7.7)
Neutrophils Relative %: 54 %
Platelets: 288 10*3/uL (ref 150–400)
RBC: 3.51 MIL/uL — ABNORMAL LOW (ref 3.87–5.11)
RDW: 15.7 % — ABNORMAL HIGH (ref 11.5–15.5)
WBC: 7.6 10*3/uL (ref 4.0–10.5)
nRBC: 0 % (ref 0.0–0.2)

## 2022-11-08 MED ORDER — ADULT MULTIVITAMIN W/MINERALS CH
1.0000 | ORAL_TABLET | Freq: Every day | ORAL | Status: DC
  Administered 2022-11-08 – 2022-11-25 (×7): 1 via ORAL
  Filled 2022-11-08 (×20): qty 1

## 2022-11-08 MED ORDER — LOPERAMIDE HCL 2 MG PO CAPS
2.0000 mg | ORAL_CAPSULE | ORAL | Status: DC | PRN
  Administered 2022-11-08 – 2022-11-18 (×27): 2 mg via ORAL
  Filled 2022-11-08 (×30): qty 1

## 2022-11-08 NOTE — Progress Notes (Signed)
Progress Note   Assessment    66 year old female with a history of rheumatoid arthritis, chronic pain, history of bradycardia and diastolic heart failure, anxiety and depression presenting with probable perforated gastric ulcer with peritonitis and found to have choledocholithiasis without biliary obstruction   Recommendations   Probable perforated gastric ulcer --improving and tolerating soft solids.  Plan in place for twice daily PPI and strict NSAID and alcohol avoidance.  H. pylori pending. -- Continue to advance diet as tolerated -- Twice daily PPI for at least 12 weeks -- Strict NSAID/aspirin and alcohol avoidance -- Okay for Carafate 1 g 3 times daily before meals and at bedtime if clinically helping pain -- Follow-up H. pylori status -- Outpatient follow-up with Dr. Marina Goodell, consideration of outpatient upper endoscopy -- General surgery has laid out a plan for antibiotic course  2.  Choledocholithiasis --liver enzymes remain normal. -- Follow-up with Dr. Marina Goodell to consider outpatient ERCP  3.  Chronic diarrhea --question of pancreatic insufficiency given pancreatic atrophy.  Her pancreatic duct is also prominent, likely of no significance but this can be followed on follow-up imaging.  4.  Rheumatoid arthritis/chronic pain --she takes oxycodone and tizanidine chronically, pain management per hospitalist medicine  GI will sign off, I will arrange outpatient follow-up with GI, call if questions   Chief Complaint   Tolerated eggs and oatmeal for breakfast without exacerbation of pain or nausea Still complaining of nonbloody diarrhea, loperamide given this morning; diarrhea she reports is chronic Diffuse pain which is chronic, she takes chronic tizanidine, oxycodone and is getting IV Dilaudid here for breakthrough  Vital signs in last 24 hours: Temp:  [97.7 F (36.5 C)-98.4 F (36.9 C)] 98.4 F (36.9 C) (11/14 0827) Pulse Rate:  [73-85] 83 (11/14 0827) Resp:  [16] 16  (11/14 0827) BP: (109-127)/(57-72) 122/72 (11/14 0827) SpO2:  [93 %-97 %] 95 % (11/14 0827) Last BM Date : 11/07/22  Gen: awake, alert, NAD, chronically ill-appearing HEENT: anicteric, op clear CV: RRR, no mrg Pulm: CTA b/l Abd: soft, diffusely tender without rebound or guarding, positive bowel sounds  Ext: no c/c/e Neuro: nonfocal   Intake/Output from previous day: 11/13 0701 - 11/14 0700 In: 557.7 [P.O.:120; I.V.:137.7; IV Piggyback:300] Out: 1600 [Urine:1600] Intake/Output this shift: No intake/output data recorded.  Lab Results: Recent Labs    11/06/22 0400 11/07/22 0505 11/08/22 0241  WBC 7.3 8.8 7.6  HGB 9.1* 9.0* 8.7*  HCT 30.1* 29.4* 29.4*  PLT 284 309 288   BMET Recent Labs    11/06/22 0400 11/07/22 0505 11/08/22 0241  NA 138 139 137  K 3.2* 3.1* 4.1  CL 101 102 104  CO2 30 31 30   GLUCOSE 92 92 102*  BUN <5* <5* 7*  CREATININE 0.53 0.55 0.62  CALCIUM 8.6* 8.4* 8.4*   LFT Recent Labs    11/08/22 0241  PROT 4.9*  ALBUMIN 2.3*  AST 11*  ALT 7  ALKPHOS 106  BILITOT 0.3   PT/INR No results for input(s): "LABPROT", "INR" in the last 72 hours. Hepatitis Panel No results for input(s): "HEPBSAG", "HCVAB", "HEPAIGM", "HEPBIGM" in the last 72 hours.  Studies/Results: DG UGI W SINGLE CM (SOL OR THIN BA)  Result Date: 11/07/2022 CLINICAL DATA:  Evaluation for perforated gastric ulcer. Exam is limited due to patient's immobility. EXAM: DG UGI W SINGLE CM TECHNIQUE: Scout radiograph was obtained. Single contrast examination was performed using thin liquid barium. This exam was performed by 11/09/2022, PA-C, and was supervised and interpreted  by Caprice Renshaw, MD. FLUOROSCOPY: Radiation Exposure Index (as provided by the fluoroscopic device): 16.3 mGy Kerma COMPARISON:  CT abdomen and pelvis done November 05, 2022 FINDINGS: Limited upper GI exam due to patient condition. Scout radiograph demonstrates no evidence of bowel obstruction and prior lumbosacral  fusion. Normal appearance of the distal esophagus. No gastroesophageal reflux occurred during the exam. No hiatal hernia. There is no evidence of extraluminal contrast leak. Normal appearance of the duodenum. IMPRESSION: Limited upper GI exam due to patient condition. No evidence of extraluminal contrast leak on limited views. Procedure performed by: Corrin Parker, PA-C Electronically Signed   By: Caprice Renshaw M.D.   On: 11/07/2022 09:57      LOS: 8 days   Beverley Fiedler, MD 11/08/2022, 11:50 AM See Loretha Stapler, [facility], to contact our on call provider

## 2022-11-08 NOTE — Evaluation (Signed)
Physical Therapy Evaluation  Patient Details Name: Jamie [name] MRN: [id number] DOB: [birth year] Today's Date: 11/08/2022  History of Present Illness  Pt is a 66 y/o female who presents 10/31/2022 from home where she was found by EMS on her couch, covered in urine and feces, unable to get up. Pt called EMS due to SOB and abdominal pain. She was found to have a probable gastric ulcer. PMH significant for anxiety, gastroenteritis, ETOH abuse.   Clinical Impression  Pt admitted with above diagnosis. Pt currently with functional limitations due to the deficits listed below (see PT Problem List). At the time of PT eval pt was limited by abdominal pain and perseverating on getting pain meds. Once she received meds she was agreeable to OOB mobility, however limited to bed>chair transfer. Anticipate pt will progress well as pain decreases. Will continue to assess for most appropriate d/c plan but based on performance today and history of falls/decreased mobility at home, SNF level rehab appears to be the most appropriate at this time. Acutely, pt will benefit from skilled PT to increase their independence and safety with mobility to allow discharge to the venue listed below.          Recommendations for follow up therapy are one component of a multi-disciplinary discharge planning process, led by the attending physician.  Recommendations may be updated based on patient status, additional functional criteria and insurance authorization.  Follow Up Recommendations Skilled nursing-short term rehab (<3 hours/day) Can patient physically be transported by private vehicle: Yes    Assistance Recommended at Discharge Intermittent Supervision/Assistance  Patient can return home with the following  A little help with walking and/or transfers;A little help with bathing/dressing/bathroom;Assistance with cooking/housework;Assist for transportation;Help with stairs or ramp for entrance    Equipment Recommendations     Recommendations for Other Services       Functional Status Assessment Patient has had a recent decline in their functional status and demonstrates the ability to make significant improvements in function in a reasonable and predictable amount of time.     Precautions / Restrictions Precautions Precautions: Fall Restrictions Weight Bearing Restrictions: No      Mobility  Bed Mobility Overal bed mobility: Needs Assistance Bed Mobility: Supine to Sit     Supine to sit: Supervision     General bed mobility comments: Pt elevated trunk into long sitting and was able to swing legs around to sit EOB with increased time and therapist managing lines.    Transfers Overall transfer level: Needs assistance Equipment used: Rolling walker (2 wheels) Transfers: Sit to/from Stand, Bed to chair/wheelchair/BSC Sit to Stand: Min assist, +2 physical assistance   Step pivot transfers: Min assist, +2 physical assistance, +2 safety/equipment       General transfer comment: Increased time. Pt was able to power up to full stand with +2 min assist for balance support and safety. Trunk significantly flexed throughout transition to recliner.    Ambulation/Gait               General Gait Details: Unable to progress to gait training this session due to abdominal pain.  Stairs            Wheelchair Mobility    Modified Rankin (Stroke Patients Only)       Balance Overall balance assessment: Needs assistance Sitting-balance support: Feet supported, No upper extremity supported Sitting balance-Leahy Scale: Fair     Standing balance support: During functional activity, Reliant on assistive device for balance, Bilateral upper extremity  supported Standing balance-Leahy Scale: Poor                               Pertinent Vitals/Pain Pain Assessment Pain Assessment: Faces Faces Pain Scale: Hurts whole lot Pain Location: Abdomen, back, knees. Pain Descriptors /  Indicators: Grimacing, Guarding, Moaning Pain Intervention(s): Limited activity within patient's tolerance, Monitored during session, Repositioned    Home Living Family/patient expects to be discharged to:: Private residence Living Arrangements: Alone Available Help at Discharge: Family;Available PRN/intermittently Type of Home: House Home Access: Stairs to enter Entrance Stairs-Rails: Right;Left;Can reach both Entrance Stairs-Number of Steps: 2-3   Home Layout: One level Home Equipment: Cane - single point;Cane - Programmer, applications (2 wheels);BSC/3in1;Shower seat - built in Additional Comments: Pt reports that she has not had PCA for long    Prior Function Prior Level of Function : History of Falls (last six months) (3-4 in the last 6 months)             Mobility Comments: Occasionally uses the cane/walker as needed. Furniture walks. ADLs Comments: Sponge bathing since June     Hand Dominance   Dominant Hand: Right    Extremity/Trunk Assessment   Upper Extremity Assessment Upper Extremity Assessment: Generalized weakness    Lower Extremity Assessment Lower Extremity Assessment: Generalized weakness    Cervical / Trunk Assessment Cervical / Trunk Assessment: Other exceptions Cervical / Trunk Exceptions: Forward head posture with rounded shoulders. Pt reports baseline back pain.  Communication   Communication: No difficulties  Cognition Arousal/Alertness: Awake/alert Behavior During Therapy: WFL for tasks assessed/performed (Mildly agitated at times but able to be redirected) Overall Cognitive Status: Within Functional Limits for tasks assessed                                          General Comments      Exercises     Assessment/Plan    PT Assessment Patient needs continued PT services  PT Problem List Decreased strength;Decreased activity tolerance;Decreased balance;Decreased mobility;Decreased knowledge of use of DME;Decreased safety  awareness;Decreased knowledge of precautions;Pain       PT Treatment Interventions DME instruction;Gait training;Stair training;Functional mobility training;Therapeutic activities;Therapeutic exercise;Balance training;Patient/family education    PT Goals (Current goals can be found in the Care Plan section)  Acute Rehab PT Goals Patient Stated Goal: Decrease pain PT Goal Formulation: With patient Time For Goal Achievement: 11/22/22 Potential to Achieve Goals: Good    Frequency Min 3X/week     Co-evaluation               AM-PAC PT "6 Clicks" Mobility  Outcome Measure Help needed turning from your back to your side while in a flat bed without using bedrails?: None Help needed moving from lying on your back to sitting on the side of a flat bed without using bedrails?: A Little Help needed moving to and from a bed to a chair (including a wheelchair)?: A Little Help needed standing up from a chair using your arms (e.g., wheelchair or bedside chair)?: A Little Help needed to walk in hospital room?: A Little Help needed climbing 3-5 steps with a railing? : A Little 6 Click Score: 19    End of Session Equipment Utilized During Treatment:  (Gait belt deferred due to abdominal pain) Activity Tolerance: Patient limited by pain Patient left: in chair;with call  bell/phone within reach;with chair alarm set Nurse Communication: Mobility status PT Visit Diagnosis: Unsteadiness on feet (R26.81);Pain;Difficulty in walking, not elsewhere classified (R26.2) Pain - part of body:  (abdomen)    Time: 0300-9233 PT Time Calculation (min) (ACUTE ONLY): 35 min   Charges:   PT Evaluation $PT Eval Moderate Complexity: 1 Mod PT Treatments $Gait Training: 8-22 mins        Conni Slipper, PT, DPT Acute Rehabilitation Services Secure Chat Preferred Office: [phone]   [name] 11/08/2022, 3:02 PM

## 2022-11-08 NOTE — Progress Notes (Signed)
[facility]                                                                               Jamie [name], is a 66 y.o. female, DOB - 1956-04-14, EVO:[id number] Admit date - 10/31/2022    Outpatient Primary MD for the patient is Barbie Banner, MD  LOS - 8  days    Brief summary    66 year old female with a history of rheumatoid arthritis, chronic pain, history of bradycardia and diastolic heart failure, anxiety and depression presenting with probable perforated gastric ulcer with peritonitis and found to have choledocholithiasis without biliary obstruction    Assessment & Plan    Probable perforated gastric ulcer/septic shock secondary to peritonitis Sepsis is resolved Off pressors, continue with cefepime and Flagyl per surgery. She has been cleared for discharge .   Acute perforated peptic ulcer disease with peritonitis Secondary to microperforation Patient has been using Goody powders for rheumatoid arthritis. General surgery felt microperforation has sealed. CT scan on 11/11 shows retroperitoneal edema with gastric antral wall thickening.  Free air has resolved recommend outpatient follow-up with general surgery and gastroenterology. We will plan for 14 days of antibiotics.     Choledocholithiasis MRCP shows ductal dilatation secondary to a stone measuring about 4 mm with mildly by dilated gallbladder and pericholecystic fluid likely secondary to chronic inflammation.  Recommend outpatient follow-up with GI for ERCP and endoscopy. Liver enzymes appear to have normalized.    Acute on chronic blood loss anemia Continue with PPI twice daily     Seizure disorder Continue with Keppra   Opiate dependence Continue with current pain management   Sinus bradycardia Resolved    Hypokalemia replaced  Anxiety Continue with Xanax 3 times daily          RN Pressure Injury Documentation: Pressure Injury 10/31/22 Sacrum Medial Stage 2 -  Partial  thickness loss of dermis presenting as a shallow open injury with a red, pink wound bed without slough. (Active)  10/31/22 2322  Location: Sacrum  Location Orientation: Medial  Staging: Stage 2 -  Partial thickness loss of dermis presenting as a shallow open injury with a red, pink wound bed without slough.  Wound Description (Comments):   Present on Admission: Yes  Dressing Type Foam - Lift dressing to assess site every shift 11/07/22 1300    Malnutrition Type:  Nutrition Problem: Inadequate oral intake Etiology: acute illness   Malnutrition Characteristics:  Signs/Symptoms:  (clear liquid diet)   Nutrition Interventions:  Interventions: MVI, Liberalize Diet  Estimated body mass index is 20.46 kg/m as calculated from the following:   Height as of this encounter: 5\' 6"  (1.676 m).   Weight as of this encounter: 57.5 kg.  Code Status: full code.  DVT Prophylaxis:  enoxaparin (LOVENOX) injection 40 mg Start: 11/07/22 1800 Place and maintain sequential compression device Start: 11/06/22 1958   Level of Care: Level of care: Telemetry Surgical Family Communication: none at bedside.   Disposition Plan:     Remains inpatient appropriate:  DISCHARGE HOME TOMORROW  Procedures:  UGI  Consultants:   Gi  Surgery.   Antimicrobials:   Anti-infectives (From admission, onward)  Start     Dose/Rate Route Frequency Ordered Stop   11/07/22 0800  metroNIDAZOLE (FLAGYL) IVPB 500 mg        500 mg 100 mL/hr over 60 Minutes Intravenous Every 12 hours 11/06/22 2001     11/07/22 0300  ceFEPIme (MAXIPIME) 2 g in sodium chloride 0.9 % 100 mL IVPB        2 g 200 mL/hr over 30 Minutes Intravenous Every 8 hours 11/06/22 1948     11/02/22 1000  ceFEPIme (MAXIPIME) 2 g in sodium chloride 0.9 % 100 mL IVPB  Status:  Discontinued        2 g 200 mL/hr over 30 Minutes Intravenous Every 8 hours 11/02/22 0754 11/06/22 1908   11/01/22 0600  metroNIDAZOLE (FLAGYL) IVPB 500 mg  Status:   Discontinued        500 mg 100 mL/hr over 60 Minutes Intravenous Every 12 hours 10/31/22 1747 11/06/22 1859   10/31/22 2200  ceFEPIme (MAXIPIME) 2 g in sodium chloride 0.9 % 100 mL IVPB  Status:  Discontinued        2 g 200 mL/hr over 30 Minutes Intravenous Every 12 hours 10/31/22 1757 11/02/22 0754   10/31/22 1600  metroNIDAZOLE (FLAGYL) IVPB 500 mg        500 mg 100 mL/hr over 60 Minutes Intravenous  Once 10/31/22 1546 10/31/22 1759   10/31/22 1445  ceFEPIme (MAXIPIME) 2 g in sodium chloride 0.9 % 100 mL IVPB        2 g 200 mL/hr over 30 Minutes Intravenous  Once 10/31/22 1440 10/31/22 1643        Medications  Scheduled Meds:  amLODipine  5 mg Oral Daily   Chlorhexidine Gluconate Cloth  6 each Topical Daily   enoxaparin (LOVENOX) injection  40 mg Subcutaneous Q24H   multivitamin with minerals  1 tablet Oral Daily   pantoprazole  40 mg Oral BID   sucralfate  1 g Oral TID WC & HS   Continuous Infusions:  ceFEPime (MAXIPIME) IV 2 g (11/08/22 1121)   levETIRAcetam 500 mg (11/08/22 0804)   metronidazole 500 mg (11/08/22 0950)   PRN Meds:.acetaminophen **OR** acetaminophen, ALPRAZolam, hydrALAZINE, HYDROmorphone (DILAUDID) injection, loperamide, ondansetron **OR** ondansetron (ZOFRAN) IV, mouth rinse, oxyCODONE    Subjective:   Jamie [name] was seen and examined today.  Some loose stools.   Objective:   Vitals:   11/07/22 1944 11/08/22 0449 11/08/22 0827 11/08/22 1224  BP: 119/63 (!) 109/57 122/72 (!) 119/59  Pulse: 85 73 83 77  Resp: 16  16 16   Temp: 98.2 F (36.8 C) 97.7 F (36.5 C) 98.4 F (36.9 C) 98.2 F (36.8 C)  TempSrc: Oral Oral Oral Oral  SpO2: 95% 93% 95% 95%  Weight:      Height:        Intake/Output Summary (Last 24 hours) at 11/08/2022 1451 Last data filed at 11/08/2022 0600 Gross per 24 hour  Intake 557.65 ml  Output 950 ml  Net -392.35 ml   Filed Weights   10/31/22 1738 10/31/22 2200  Weight: 59.9 kg 57.5 kg     Exam General: Alert and  oriented x 3, NAD Cardiovascular: S1 S2 auscultated, no murmurs, RRR Respiratory: Clear to auscultation bilaterally, no wheezing, rales or rhonchi Gastrointestinal: Soft, nontender, nondistended, + bowel sounds Ext: no pedal edema bilaterally Neuro: AAOx3, Cr N's II- XII. Strength 5/5 upper and lower extremities bilaterally Skin: No rashes Psych: Normal affect and demeanor, alert and oriented x3  Data Reviewed:  I have personally reviewed following labs and imaging studies   CBC Lab Results  Component Value Date   WBC 7.6 11/08/2022   RBC 3.51 (L) 11/08/2022   HGB 8.7 (L) 11/08/2022   HCT 29.4 (L) 11/08/2022   MCV 83.8 11/08/2022   MCH 24.8 (L) 11/08/2022   PLT 288 11/08/2022   MCHC 29.6 (L) 11/08/2022   RDW 15.7 (H) 11/08/2022   LYMPHSABS 2.0 11/08/2022   MONOABS 0.6 11/08/2022   EOSABS 0.7 (H) 11/08/2022   BASOSABS 0.1 11/08/2022     Last metabolic panel Lab Results  Component Value Date   NA 137 11/08/2022   K 4.1 11/08/2022   CL 104 11/08/2022   CO2 30 11/08/2022   BUN 7 (L) 11/08/2022   CREATININE 0.62 11/08/2022   GLUCOSE 102 (H) 11/08/2022   GFRNONAA >60 11/08/2022   GFRAA >60 07/04/2020   CALCIUM 8.4 (L) 11/08/2022   PHOS 3.0 11/01/2022   PROT 4.9 (L) 11/08/2022   ALBUMIN 2.3 (L) 11/08/2022   BILITOT 0.3 11/08/2022   ALKPHOS 106 11/08/2022   AST 11 (L) 11/08/2022   ALT 7 11/08/2022   ANIONGAP 3 (L) 11/08/2022    CBG (last 3)  No results for input(s): "GLUCAP" in the last 72 hours.    Coagulation Profile: No results for input(s): "INR", "PROTIME" in the last 168 hours.   Radiology Studies: DG UGI W SINGLE CM (SOL OR THIN BA)  Result Date: 11/07/2022 CLINICAL DATA:  Evaluation for perforated gastric ulcer. Exam is limited due to patient's immobility. EXAM: DG UGI W SINGLE CM TECHNIQUE: Scout radiograph was obtained. Single contrast examination was performed using thin liquid barium. This exam was performed by Corrin Parker, PA-C, and was  supervised and interpreted by Caprice Renshaw, MD. FLUOROSCOPY: Radiation Exposure Index (as provided by the fluoroscopic device): 16.3 mGy Kerma COMPARISON:  CT abdomen and pelvis done November 05, 2022 FINDINGS: Limited upper GI exam due to patient condition. Scout radiograph demonstrates no evidence of bowel obstruction and prior lumbosacral fusion. Normal appearance of the distal esophagus. No gastroesophageal reflux occurred during the exam. No hiatal hernia. There is no evidence of extraluminal contrast leak. Normal appearance of the duodenum. IMPRESSION: Limited upper GI exam due to patient condition. No evidence of extraluminal contrast leak on limited views. Procedure performed by: Corrin Parker, PA-C Electronically Signed   By: Caprice Renshaw M.D.   On: 11/07/2022 09:57       Kathlen Mody M.D. [facility] 11/08/2022, 2:51 PM  Available via Epic secure chat 7am-7pm After 7 pm, please refer to night coverage provider listed on amion.

## 2022-11-08 NOTE — Progress Notes (Cosign Needed Addendum)
Subjective: CC: Afebrile. No tachycardia or systolic hypotension. WBC wnl. UGI negative yesterday. Advanced to [facility] diet by primary/GI.   Reports stable pain in her upper abdomen, more on the left. Occassional nausea but not related to po intake. Tolerating HH diet without worsening abdominal pain. BM x 2 yesterday.   Objective: Vital signs in last 24 hours: Temp:  [97.7 F (36.5 C)-98.4 F (36.9 C)] 98.4 F (36.9 C) (11/14 0827) Pulse Rate:  [73-85] 83 (11/14 0827) Resp:  [16] 16 (11/14 0827) BP: (109-127)/(57-72) 122/72 (11/14 0827) SpO2:  [93 %-97 %] 95 % (11/14 0827) Last BM Date : 11/07/22  Intake/Output from previous day: 11/13 0701 - 11/14 0700 In: 557.7 [P.O.:120; I.V.:137.7; IV Piggyback:300] Out: 1600 [Urine:1600] Intake/Output this shift: No intake/output data recorded.  PE: Gen:  Alert, NAD, pleasant Abd: Soft, mild distension, generalized ttp greatest on the left. No rigidity or guarding. +BS  Lab Results:  Recent Labs    11/07/22 0505 11/08/22 0241  WBC 8.8 7.6  HGB 9.0* 8.7*  HCT 29.4* 29.4*  PLT 309 288    BMET Recent Labs    11/07/22 0505 11/08/22 0241  NA 139 137  K 3.1* 4.1  CL 102 104  CO2 31 30  GLUCOSE 92 102*  BUN <5* 7*  CREATININE 0.55 0.62  CALCIUM 8.4* 8.4*    PT/INR No results for input(s): "LABPROT", "INR" in the last 72 hours. CMP     Component Value Date/Time   NA 137 11/08/2022 0241   K 4.1 11/08/2022 0241   CL 104 11/08/2022 0241   CO2 30 11/08/2022 0241   GLUCOSE 102 (H) 11/08/2022 0241   BUN 7 (L) 11/08/2022 0241   CREATININE 0.62 11/08/2022 0241   CALCIUM 8.4 (L) 11/08/2022 0241   PROT 4.9 (L) 11/08/2022 0241   ALBUMIN 2.3 (L) 11/08/2022 0241   AST 11 (L) 11/08/2022 0241   ALT 7 11/08/2022 0241   ALKPHOS 106 11/08/2022 0241   BILITOT 0.3 11/08/2022 0241   GFRNONAA >60 11/08/2022 0241   GFRAA >60 07/04/2020 1636   Lipase     Component Value Date/Time   LIPASE 24 09/29/2022 0024     Studies/Results: DG UGI W SINGLE CM (SOL OR THIN BA)  Result Date: 11/07/2022 CLINICAL DATA:  Evaluation for perforated gastric ulcer. Exam is limited due to patient's immobility. EXAM: DG UGI W SINGLE CM TECHNIQUE: Scout radiograph was obtained. Single contrast examination was performed using thin liquid barium. This exam was performed by Corrin Parker, PA-C, and was supervised and interpreted by Caprice Renshaw, MD. FLUOROSCOPY: Radiation Exposure Index (as provided by the fluoroscopic device): 16.3 mGy Kerma COMPARISON:  CT abdomen and pelvis done November 05, 2022 FINDINGS: Limited upper GI exam due to patient condition. Scout radiograph demonstrates no evidence of bowel obstruction and prior lumbosacral fusion. Normal appearance of the distal esophagus. No gastroesophageal reflux occurred during the exam. No hiatal hernia. There is no evidence of extraluminal contrast leak. Normal appearance of the duodenum. IMPRESSION: Limited upper GI exam due to patient condition. No evidence of extraluminal contrast leak on limited views. Procedure performed by: Corrin Parker, PA-C Electronically Signed   By: Caprice Renshaw M.D.   On: 11/07/2022 09:57    Anti-infectives: Anti-infectives (From admission, onward)    Start     Dose/Rate Route Frequency Ordered Stop   11/07/22 0800  metroNIDAZOLE (FLAGYL) IVPB 500 mg        500 mg 100 mL/hr over 60  Minutes Intravenous Every 12 hours 11/06/22 2001     11/07/22 0300  ceFEPIme (MAXIPIME) 2 g in sodium chloride 0.9 % 100 mL IVPB        2 g 200 mL/hr over 30 Minutes Intravenous Every 8 hours 11/06/22 1948     11/02/22 1000  ceFEPIme (MAXIPIME) 2 g in sodium chloride 0.9 % 100 mL IVPB  Status:  Discontinued        2 g 200 mL/hr over 30 Minutes Intravenous Every 8 hours 11/02/22 0754 11/06/22 1908   11/01/22 0600  metroNIDAZOLE (FLAGYL) IVPB 500 mg  Status:  Discontinued        500 mg 100 mL/hr over 60 Minutes Intravenous Every 12 hours 10/31/22 1747 11/06/22 1859    10/31/22 2200  ceFEPIme (MAXIPIME) 2 g in sodium chloride 0.9 % 100 mL IVPB  Status:  Discontinued        2 g 200 mL/hr over 30 Minutes Intravenous Every 12 hours 10/31/22 1757 11/02/22 0754   10/31/22 1600  metroNIDAZOLE (FLAGYL) IVPB 500 mg        500 mg 100 mL/hr over 60 Minutes Intravenous  Once 10/31/22 1546 10/31/22 1759   10/31/22 1445  ceFEPIme (MAXIPIME) 2 g in sodium chloride 0.9 % 100 mL IVPB        2 g 200 mL/hr over 30 Minutes Intravenous  Once 10/31/22 1440 10/31/22 1643        Assessment/Plan Possible perforated gastric ulcer - Noted on initial scan at [facility] 11/6 with foci of free air in the upper abdomen with wall thickening of both the gastric cardia and distal stomach concerning for gastric perforation.  Transferred to [facility] for choledocholithiasis.  Repeat CT 11/11 with asymmetric wall thickening of the gastric antrum but resolution of free intraperitoneal gas - UGI with no evidence of extraluminal contrast leak.  - Cont PPI BID and Carafate - H. Pylori is pending.  - GI already planning f/u for EGD to evaluate this as outpatient  - Now tolerating diet w/o worsening of her symptoms and her abdomen is soft. Her wbc is wnl and she is HDS w/o fever, tachycardia or systolic hypotension. Can start moving towards d/c from our standpoint. Recommend 14d total of abx, continuing PPI + Carafaate and avoiding any NSAID or alcohol use. F/u Dr. Lovell Sheehan and GI as note above.   Choledocholithiasis  - Noted on MRCP w/ 2 small stones mid duct measuring up to 4 mm, common bile duct measures 12 mm. GI has evaluated. Not planning ERCP w/ possible perforated gastric ulcer. They suspect this is more chronic then acute with no elevated of LFT's. - MRCP also did note sludge in gallbladder with pericholecystitic fluid. Question if pericholecystitic fluid was actually from perf ulcer. GB was reassuring on repeat CT 11/11. WBC is now wnl. LFT's remain non-elevated. Most of her pain seem to be left sided.  She is already on abx. I do not think she needs any surgery or a perc chole at this time.  - F/u with Dr. Lovell Sheehan for above.   FEN - HH IVF per primary VTE - SCDs, okay for chem ppx from a general surgery standpoint ID - Cefepime/Flagyl  Pancreatic duct dilation - GI following Chronic anemia Hx seizure disorder Opiate dependence    LOS: 8 days    Jacinto Halim , [facility] 11/08/2022, 8:56 AM Please see Amion for pager number during day hours 7:00am-4:30pm

## 2022-11-08 NOTE — Plan of Care (Signed)
  Problem: Health Behavior/Discharge Planning: Goal: Ability to manage health-related needs will improve Outcome: Progressing   Problem: Clinical Measurements: Goal: Ability to maintain clinical measurements within normal limits will improve Outcome: Progressing Goal: Will remain free from infection Outcome: Progressing Goal: Diagnostic test results will improve Outcome: Progressing Goal: Respiratory complications will improve Outcome: Progressing Goal: Cardiovascular complication will be avoided Outcome: Progressing   Problem: Activity: Goal: Risk for activity intolerance will decrease Outcome: Not Progressing   Problem: Nutrition: Goal: Adequate nutrition will be maintained Outcome: Progressing   Problem: Coping: Goal: Level of anxiety will decrease Outcome: Progressing   Problem: Elimination: Goal: Will not experience complications related to bowel motility Outcome: Progressing   Problem: Pain Managment: Goal: General experience of comfort will improve Outcome: Progressing   Problem: Safety: Goal: Ability to remain free from injury will improve Outcome: Progressing   Problem: Skin Integrity: Goal: Risk for impaired skin integrity will decrease Outcome: Progressing

## 2022-11-08 NOTE — Progress Notes (Signed)
Nutrition Follow-up  DOCUMENTATION CODES:      INTERVENTION:   Multivitamin w/ minerals daily Liberalize pt diet to regular due to declining nutritional supplements and currently malnourished.  Encouraged good PO intake  NUTRITION DIAGNOSIS:   Inadequate oral intake related to acute illness as evidenced by  (clear liquid diet). - Progressing   GOAL:   Patient will meet greater than or equal to 90% of their needs - Ongoing  MONITOR:   PO intake, Skin, Labs  REASON FOR ASSESSMENT:   Malnutrition Screening Tool    ASSESSMENT:   patient hospitalized last month due to sepsis/pneumonia. She is a 66 yo female with chronic pain syndrome who presented with abdominal pain. Patient septic secondary to peritonitis (perforated stomach). Surgery consulted and no immediate intervention planned.  11/06 - admitted to [facility] 11/07 - diet advanced to clear liquids 11/09 - transferred to [facility] 11/10 - NPO 11/12 - diet advanced to clear liquids 11/13 - Upper GI; diet advanced to Heart Healthy  Pt eating lunch at time of RD visit. Reports that she has some abdominal pain after eating, denies any vomiting. RD offers to order pt nutritional supplements, pt declines. Pt request regular diet, RD to liberalize pt diet due to malnutrition and minimal PO intake for 7 days.  Pt with no other questions or concerns at this time.   Medications reviewed and include: Protonix, Sucralfate, IV antibiotics Labs reviewed.   Diet Order:   Diet Order             Diet regular Room service appropriate? Yes; Fluid consistency: Thin  Diet effective now                  EDUCATION NEEDS:   Education needs have been addressed  Skin:  Skin Assessment: Skin Integrity Issues: Skin Integrity Issues:: Stage II Stage II: sacrum  Last BM:  11/13 - Type 7  Height:  Ht Readings from Last 1 Encounters:  10/31/22 5\' 6"  (1.676 m)   Weight:  Wt Readings from Last 1 Encounters:  10/31/22 57.5 kg   BMI:  Body  mass index is 20.46 kg/m.  Estimated Nutritional Needs:   Kcal:  1700-1900  Protein:  85-100 grams  Fluid:  >/= 1.7 L    13/06/23 RD, LDN Clinical Dietitian See [facility] for contact information.

## 2022-11-09 ENCOUNTER — Ambulatory Visit: Payer: Medicare HMO | Attending: Cardiovascular Disease | Admitting: Cardiovascular Disease

## 2022-11-09 ENCOUNTER — Inpatient Hospital Stay (HOSPITAL_COMMUNITY): Payer: Medicare HMO

## 2022-11-09 DIAGNOSIS — K668 Other specified disorders of peritoneum: Secondary | ICD-10-CM | POA: Diagnosis not present

## 2022-11-09 DIAGNOSIS — A419 Sepsis, unspecified organism: Secondary | ICD-10-CM | POA: Diagnosis not present

## 2022-11-09 DIAGNOSIS — K659 Peritonitis, unspecified: Secondary | ICD-10-CM | POA: Diagnosis not present

## 2022-11-09 LAB — CBC WITH DIFFERENTIAL/PLATELET
Abs Immature Granulocytes: 0.06 10*3/uL (ref 0.00–0.07)
Basophils Absolute: 0.1 10*3/uL (ref 0.0–0.1)
Basophils Relative: 1 %
Eosinophils Absolute: 0.6 10*3/uL — ABNORMAL HIGH (ref 0.0–0.5)
Eosinophils Relative: 9 %
HCT: 28.6 % — ABNORMAL LOW (ref 36.0–46.0)
Hemoglobin: 8.9 g/dL — ABNORMAL LOW (ref 12.0–15.0)
Immature Granulocytes: 1 %
Lymphocytes Relative: 25 %
Lymphs Abs: 1.8 10*3/uL (ref 0.7–4.0)
MCH: 25.6 pg — ABNORMAL LOW (ref 26.0–34.0)
MCHC: 31.1 g/dL (ref 30.0–36.0)
MCV: 82.2 fL (ref 80.0–100.0)
Monocytes Absolute: 0.6 10*3/uL (ref 0.1–1.0)
Monocytes Relative: 8 %
Neutro Abs: 4.2 10*3/uL (ref 1.7–7.7)
Neutrophils Relative %: 56 %
Platelets: 272 10*3/uL (ref 150–400)
RBC: 3.48 MIL/uL — ABNORMAL LOW (ref 3.87–5.11)
RDW: 15.9 % — ABNORMAL HIGH (ref 11.5–15.5)
WBC: 7.3 10*3/uL (ref 4.0–10.5)
nRBC: 0 % (ref 0.0–0.2)

## 2022-11-09 LAB — H. PYLORI ANTIGEN, STOOL: H. Pylori Stool Ag, Eia: NEGATIVE

## 2022-11-09 LAB — COMPREHENSIVE METABOLIC PANEL
ALT: 7 U/L (ref 0–44)
AST: 11 U/L — ABNORMAL LOW (ref 15–41)
Albumin: 2.2 g/dL — ABNORMAL LOW (ref 3.5–5.0)
Alkaline Phosphatase: 108 U/L (ref 38–126)
Anion gap: 5 (ref 5–15)
BUN: 8 mg/dL (ref 8–23)
CO2: 30 mmol/L (ref 22–32)
Calcium: 8.5 mg/dL — ABNORMAL LOW (ref 8.9–10.3)
Chloride: 104 mmol/L (ref 98–111)
Creatinine, Ser: 0.6 mg/dL (ref 0.44–1.00)
GFR, Estimated: >60 mL/min (ref >60)
Glucose, Bld: 111 mg/dL — ABNORMAL HIGH (ref 70–99)
Potassium: 3.7 mmol/L (ref 3.5–5.1)
Sodium: 139 mmol/L (ref 135–145)
Total Bilirubin: 0.3 mg/dL (ref 0.3–1.2)
Total Protein: 4.8 g/dL — ABNORMAL LOW (ref 6.5–8.1)

## 2022-11-09 MED ORDER — IOHEXOL 350 MG/ML SOLN
75.0000 mL | Freq: Once | INTRAVENOUS | Status: AC | PRN
  Administered 2022-11-09: 75 mL via INTRAVENOUS

## 2022-11-09 MED ORDER — AMOXICILLIN-POT CLAVULANATE 875-125 MG PO TABS
1.0000 | ORAL_TABLET | Freq: Two times a day (BID) | ORAL | Status: AC
  Administered 2022-11-09 – 2022-11-22 (×28): 1 via ORAL
  Filled 2022-11-09 (×28): qty 1

## 2022-11-09 MED ORDER — LEVETIRACETAM 500 MG PO TABS
500.0000 mg | ORAL_TABLET | Freq: Two times a day (BID) | ORAL | Status: DC
  Administered 2022-11-09 – 2022-11-24 (×32): 500 mg via ORAL
  Filled 2022-11-09 (×33): qty 1

## 2022-11-09 MED ORDER — ALTEPLASE 2 MG IJ SOLR
2.0000 mg | Freq: Once | INTRAMUSCULAR | Status: AC
  Administered 2022-11-09: 2 mg
  Filled 2022-11-09: qty 2

## 2022-11-09 MED ORDER — IOHEXOL 9 MG/ML PO SOLN
1000.0000 mL | Freq: Once | ORAL | Status: AC | PRN
  Administered 2022-11-09: 1000 mL via ORAL

## 2022-11-09 NOTE — Progress Notes (Cosign Needed Addendum)
Subjective: CC: Afebrile. No tachycardia or systolic hypotension. WBC wnl.   Reports last night she began having a little worse LUQ abdominal pain after p.o. intake.  She does not think she has any nausea.  No vomiting.  Still passing flatus and having BMs.  Objective: Vital signs in last 24 hours: Temp:  [98 F (36.7 C)-98.4 F (36.9 C)] 98 F (36.7 C) (11/15 0821) Pulse Rate:  [75-89] 89 (11/15 0821) Resp:  [16-18] 18 (11/15 0821) BP: (116-136)/(49-72) 131/61 (11/15 0821) SpO2:  [95 %-99 %] 99 % (11/15 0821) Last BM Date : 11/07/22  Intake/Output from previous day: 11/14 0701 - 11/15 0700 In: 615.9 [IV Piggyback:615.9] Out: 2700 [Urine:2700] Intake/Output this shift: No intake/output data recorded.  PE: Gen:  Alert, NAD, pleasant Abd: Soft, mild distension, generalized ttp greatest on the left that seems similar to prior exams. No rigidity or guarding. +BS  Lab Results:  Recent Labs    11/08/22 0241 11/09/22 0500  WBC 7.6 7.3  HGB 8.7* 8.9*  HCT 29.4* 28.6*  PLT 288 272    BMET Recent Labs    11/08/22 0241 11/09/22 0500  NA 137 139  K 4.1 3.7  CL 104 104  CO2 30 30  GLUCOSE 102* 111*  BUN 7* 8  CREATININE 0.62 0.60  CALCIUM 8.4* 8.5*    PT/INR No results for input(s): "LABPROT", "INR" in the last 72 hours. CMP     Component Value Date/Time   NA 139 11/09/2022 0500   K 3.7 11/09/2022 0500   CL 104 11/09/2022 0500   CO2 30 11/09/2022 0500   GLUCOSE 111 (H) 11/09/2022 0500   BUN 8 11/09/2022 0500   CREATININE 0.60 11/09/2022 0500   CALCIUM 8.5 (L) 11/09/2022 0500   PROT 4.8 (L) 11/09/2022 0500   ALBUMIN 2.2 (L) 11/09/2022 0500   AST 11 (L) 11/09/2022 0500   ALT 7 11/09/2022 0500   ALKPHOS 108 11/09/2022 0500   BILITOT 0.3 11/09/2022 0500   GFRNONAA >60 11/09/2022 0500   GFRAA >60 07/04/2020 1636   Lipase     Component Value Date/Time   LIPASE 24 09/29/2022 0024    Studies/Results: DG UGI W SINGLE CM (SOL OR THIN  BA)  Result Date: 11/07/2022 CLINICAL DATA:  Evaluation for perforated gastric ulcer. Exam is limited due to patient's immobility. EXAM: DG UGI W SINGLE CM TECHNIQUE: Scout radiograph was obtained. Single contrast examination was performed using thin liquid barium. This exam was performed by Corrin Parker, PA-C, and was supervised and interpreted by Caprice Renshaw, MD. FLUOROSCOPY: Radiation Exposure Index (as provided by the fluoroscopic device): 16.3 mGy Kerma COMPARISON:  CT abdomen and pelvis done November 05, 2022 FINDINGS: Limited upper GI exam due to patient condition. Scout radiograph demonstrates no evidence of bowel obstruction and prior lumbosacral fusion. Normal appearance of the distal esophagus. No gastroesophageal reflux occurred during the exam. No hiatal hernia. There is no evidence of extraluminal contrast leak. Normal appearance of the duodenum. IMPRESSION: Limited upper GI exam due to patient condition. No evidence of extraluminal contrast leak on limited views. Procedure performed by: Corrin Parker, PA-C Electronically Signed   By: Caprice Renshaw M.D.   On: 11/07/2022 09:57    Anti-infectives: Anti-infectives (From admission, onward)    Start     Dose/Rate Route Frequency Ordered Stop   11/07/22 0800  metroNIDAZOLE (FLAGYL) IVPB 500 mg        500 mg 100 mL/hr over 60 Minutes Intravenous Every  12 hours 11/06/22 2001     11/07/22 0300  ceFEPIme (MAXIPIME) 2 g in sodium chloride 0.9 % 100 mL IVPB        2 g 200 mL/hr over 30 Minutes Intravenous Every 8 hours 11/06/22 1948     11/02/22 1000  ceFEPIme (MAXIPIME) 2 g in sodium chloride 0.9 % 100 mL IVPB  Status:  Discontinued        2 g 200 mL/hr over 30 Minutes Intravenous Every 8 hours 11/02/22 0754 11/06/22 1908   11/01/22 0600  metroNIDAZOLE (FLAGYL) IVPB 500 mg  Status:  Discontinued        500 mg 100 mL/hr over 60 Minutes Intravenous Every 12 hours 10/31/22 1747 11/06/22 1859   10/31/22 2200  ceFEPIme (MAXIPIME) 2 g in sodium chloride  0.9 % 100 mL IVPB  Status:  Discontinued        2 g 200 mL/hr over 30 Minutes Intravenous Every 12 hours 10/31/22 1757 11/02/22 0754   10/31/22 1600  metroNIDAZOLE (FLAGYL) IVPB 500 mg        500 mg 100 mL/hr over 60 Minutes Intravenous  Once 10/31/22 1546 10/31/22 1759   10/31/22 1445  ceFEPIme (MAXIPIME) 2 g in sodium chloride 0.9 % 100 mL IVPB        2 g 200 mL/hr over 30 Minutes Intravenous  Once 10/31/22 1440 10/31/22 1643        Assessment/Plan Possible perforated gastric ulcer - Noted on initial scan at [facility] 11/6 with foci of free air in the upper abdomen with wall thickening of both the gastric cardia and distal stomach concerning for gastric perforation.  Transferred to [facility] for choledocholithiasis.  Repeat CT 11/11 with asymmetric wall thickening of the gastric antrum but resolution of free intraperitoneal gas - UGI with no evidence of extraluminal contrast leak.  - Cont PPI BID and Carafate - H. Pylori is pending.  - GI already planning f/u for EGD to evaluate this as outpatient  - Patient appeared clinically stable for discharge yesterday given she was tolerating diet w/o worsening of her symptoms and her abdomen is soft. She has had a CT and UGI that were reassuring as noted above. Her wbc was wnl and she was HDS w/o fever, tachycardia or systolic hypotension. We recommend 14d total of abx, continuing PPI + Carafaate and avoiding any NSAID or alcohol use, w/ f/u Dr. Lovell Sheehan and GI as note above.  Today she reports that she had increased abdominal pain with p.o. intake.  She still appears hemodynamically stable without fever, tachycardia or systolic hypertension.  Her WBC remains within normal limits.  Her exam remains similar to prior days with a soft abdomen and no signs of peritonitis.  Will discuss with MD if patient is still medically stable from our standpoint for discharge or if we should continue to observe here in the hospital. I discussed this with TRH at bedside.  Addendum:  Discussed with MD. Will get repeat CT A/P with her increased abdominal pain. NPO for now.   Choledocholithiasis  - Noted on MRCP w/ 2 small stones mid duct measuring up to 4 mm, common bile duct measures 12 mm. GI has evaluated. Not planning ERCP w/ possible perforated gastric ulcer. They suspect this is more chronic then acute with no elevated of LFT's. - MRCP also did note sludge in gallbladder with pericholecystitic fluid. Question if pericholecystitic fluid was actually from perf ulcer. GB was reassuring on repeat CT 11/11. WBC is now wnl. LFT's remain  non-elevated. Most of her pain seem to be left sided. She is already on abx. I do not think she needs any surgery or a perc chole at this time.  - F/u with Dr. Lovell Sheehan for above.   FEN - Reg, IVF per primary VTE - SCDs, okay for chem ppx from a general surgery standpoint ID - Cefepime/Flagyl  Pancreatic duct dilation - GI following Chronic anemia Hx seizure disorder Opiate dependence    LOS: 9 days    Jacinto Halim , [facility] Surgery 11/09/2022, 8:27 AM Please see Amion for pager number during day hours 7:00am-4:30pm

## 2022-11-09 NOTE — Care Management Important Message (Signed)
Important Message  Patient Details  Name: SYLVANNA BURGGRAF MRN: [id number] Date of Birth: [birth year]   Medicare Important Message Given:  Yes     Sherilyn Banker 11/09/2022, 12:08 PM

## 2022-11-09 NOTE — Progress Notes (Signed)
[facility] CONSULT PROGRESS NOTE    Progress Note  Jamie [name]  AOZ:[id number] DOB: 29-Apr-1956 DOA: 10/31/2022 PCP: Barbie Banner, MD     Brief Narrative:   Jamie [name] is an 66 y.o. female past medical history of anxiety alcohol abuse in remission, chronic back pain, history of seizure disorder and discharged from the hospital on October 04, 2022 she was treated for sepsis secondary to pneumonia treated with pressors hospital stay was complicated by seizures comes in again for right upper quadrant abdominal pain that began on 10/29/2022 in the ED CT was done that showed free air in the abdomen, was found to be shock in the perforated viscus and peritonitis, surgery was consulted.  CT also showed intrahepatic and extrahepatic duct dilation with a CBD stone and pancreatic atrophy with pancreatic duct dilation she was started empirically on cefepime and Flagyl.  Now on augmentin   Assessment/Plan:   Septic shock secondary to peritonitis due to perforated peptic ulcer in the setting of BC powder use: Sepsis now resolved. Off pressors, continue cefepime per surgery and Flagyl. GI has been consulted plan for Gastrografin that showed no contrast extravasation. Pt eval the patient, will need SNF placement. She is not complaining this morning of abdominal pain Further management per surgery.  She relates her pain is not controlled with narcotics.  Acute perforated peptic ulcer disease peritonitis (HCC) Patient has been using Goody powder due to rheumatoid arthritis. General surgery felt microperforation has sealed. Now complaining of abdominal pain. Conservative management for now, will change her to oral Augmentin.  Choledocholithiasis biliary extraperitoneal hepatic ductal dilation: MRCP showed ductal dilation to small stone measuring 4 mm with mildly dilated gallbladder and pericholecystic fluid to go to go for acute cholecystitis. Likely chronic than acute elevation of  LFTs. These are stable.  Further work-up as an outpatient by GI.  Pancreatic ductal dilation: Her CBC was done that showed similar appearing pancreatic duct in 2017 unchanged from previous.  Acute on chronic blood loss anemia No signs of overt bleeding. Continue PPI twice a day and Carafate. Hemoglobin has remained stable .  History of seizure disorder: Unprovoked continue IV Keppra for now.  Sinus bradycardia: Has resolved.  Opiate dependence: Continue current regimen pain seems to be controlled.  Anxiety: Continue Xanax 3 times a day seems to be stable.  Hypokalemia: Continue replete IV recheck in the morning.  Currently trending down we will give 1 dose of oral.  Sacral cubitus ulcer stage II present on admission RN Pressure Injury Documentation: Pressure Injury 10/31/22 Sacrum Medial Stage 2 -  Partial thickness loss of dermis presenting as a shallow open injury with a red, pink wound bed without slough. (Active)  10/31/22 2322  Location: Sacrum  Location Orientation: Medial  Staging: Stage 2 -  Partial thickness loss of dermis presenting as a shallow open injury with a red, pink wound bed without slough.  Wound Description (Comments):   Present on Admission: Yes  Dressing Type Foam - Lift dressing to assess site every shift 11/08/22 2006    DVT prophylaxis: lovenox Family Communication:non4 Status is: Inpatient Remains inpatient appropriate because: Acute perforated peptic ulcer disease with peritonitis    Code Status:     Code Status Orders  (From admission, onward)           Start     Ordered   10/31/22 1734  Full code  Continuous        10/31/22 1747  Code Status History     Date Active Date Inactive Code Status Order ID Comments User Context   09/29/2022 0431 10/04/2022 2211 Full Code [id number]  Faythe Ghee ED   07/27/2022 2304 07/30/2022 2000 Full Code [id number]  Lilyan Gilford, DO Inpatient   07/18/2022 2114  07/23/2022 0012 Full Code [id number]  Shon Hale, MD ED   09/21/2016 0441 09/21/2016 1627 Full Code [id number]  Devoria Albe, MD ED   05/08/2016 2307 05/10/2016 1521 Full Code [id number]  Opyd, Lavone Neri, MD ED         IV Access:   Peripheral IV   Procedures and diagnostic studies:   DG UGI W SINGLE CM (SOL OR THIN BA)  Result Date: 11/07/2022 CLINICAL DATA:  Evaluation for perforated gastric ulcer. Exam is limited due to patient's immobility. EXAM: DG UGI W SINGLE CM TECHNIQUE: Scout radiograph was obtained. Single contrast examination was performed using thin liquid barium. This exam was performed by Corrin Parker, PA-C, and was supervised and interpreted by Caprice Renshaw, MD. FLUOROSCOPY: Radiation Exposure Index (as provided by the fluoroscopic device): 16.3 mGy Kerma COMPARISON:  CT abdomen and pelvis done November 05, 2022 FINDINGS: Limited upper GI exam due to patient condition. Scout radiograph demonstrates no evidence of bowel obstruction and prior lumbosacral fusion. Normal appearance of the distal esophagus. No gastroesophageal reflux occurred during the exam. No hiatal hernia. There is no evidence of extraluminal contrast leak. Normal appearance of the duodenum. IMPRESSION: Limited upper GI exam due to patient condition. No evidence of extraluminal contrast leak on limited views. Procedure performed by: Corrin Parker, PA-C Electronically Signed   By: Caprice Renshaw M.D.   On: 11/07/2022 09:57     Medical Consultants:   None.   Subjective:    [name] complaining of abdominal pain she relates is worse than yesterday  Objective:    Vitals:   11/08/22 1727 11/08/22 2214 11/09/22 0532 11/09/22 0821  BP: 136/65 (!) 116/49 124/67 131/61  Pulse:  86 75 89  Resp: 16 17 16 18   Temp: 98 F (36.7 C) 98 F (36.7 C) 98.3 F (36.8 C) 98 F (36.7 C)  TempSrc: Oral Oral Oral Oral  SpO2: 95% 96% 98% 99%  Weight:      Height:       SpO2: 99 % O2 Flow Rate (L/min): 2  L/min   Intake/Output Summary (Last 24 hours) at 11/09/2022 0843 Last data filed at 11/09/2022 0532 Gross per 24 hour  Intake 615.86 ml  Output 2700 ml  Net -2084.14 ml    Filed Weights   10/31/22 1738 10/31/22 2200  Weight: 59.9 kg 57.5 kg    Exam: General exam: In no acute distress. Respiratory system: Good air movement and clear to auscultation. Cardiovascular system: S1 & S2 heard, RRR. No JVD. Gastrointestinal system: Positive bowel sounds, soft diffuse tenderness with some mild rebound and guarding Skin: No rashes, lesions or ulcers Psychiatry: Judgement and insight appear normal. Mood & affect appropriate.   Data Reviewed:    Labs: Basic Metabolic Panel: Recent Labs  Lab 11/05/22 1610 11/06/22 0400 11/07/22 0505 11/08/22 0241 11/09/22 0500  NA 136 138 139 137 139  K 3.4* 3.2* 3.1* 4.1 3.7  CL 100 101 102 104 104  CO2 31 30 31 30 30   GLUCOSE 92 92 92 102* 111*  BUN <5* <5* <5* 7* 8  CREATININE 0.51 0.53 0.55 0.62 0.60  CALCIUM 8.5* 8.6* 8.4* 8.4* 8.5*    GFR Estimated  Creatinine Clearance: 61.1 mL/min (by C-G formula based on SCr of 0.6 mg/dL). Liver Function Tests: Recent Labs  Lab 11/05/22 1610 11/06/22 0400 11/07/22 0505 11/08/22 0241 11/09/22 0500  AST 9* 9* 10* 11* 11*  ALT 9 11 9 7 7   ALKPHOS 112 107 95 106 108  BILITOT 0.2* 0.3 0.1* 0.3 0.3  PROT 5.1* 4.9* 5.0* 4.9* 4.8*  ALBUMIN 2.4* 2.3* 2.3* 2.3* 2.2*    No results for input(s): "LIPASE", "AMYLASE" in the last 168 hours. No results for input(s): "AMMONIA" in the last 168 hours. Coagulation profile No results for input(s): "INR", "PROTIME" in the last 168 hours. COVID-19 Labs  No results for input(s): "DDIMER", "FERRITIN", "LDH", "CRP" in the last 72 hours.  Lab Results  Component Value Date   SARSCOV2NAA NEGATIVE 05/04/2022   SARSCOV2NAA NEGATIVE 05/19/2021    CBC: Recent Labs  Lab 11/05/22 1610 11/06/22 0400 11/07/22 0505 11/08/22 0241 11/09/22 0500  WBC 7.9 7.3  8.8 7.6 7.3  NEUTROABS 4.6 4.4 5.2 4.2 4.2  HGB 9.4* 9.1* 9.0* 8.7* 8.9*  HCT 30.6* 30.1* 29.4* 29.4* 28.6*  MCV 82.0 82.2 81.2 83.8 82.2  PLT 309 284 309 288 272    Cardiac Enzymes: No results for input(s): "CKTOTAL", "CKMB", "CKMBINDEX", "TROPONINI" in the last 168 hours. BNP (last 3 results) No results for input(s): "PROBNP" in the last 8760 hours. CBG: No results for input(s): "GLUCAP" in the last 168 hours. D-Dimer: No results for input(s): "DDIMER" in the last 72 hours. Hgb A1c: No results for input(s): "HGBA1C" in the last 72 hours. Lipid Profile: No results for input(s): "CHOL", "HDL", "LDLCALC", "TRIG", "CHOLHDL", "LDLDIRECT" in the last 72 hours. Thyroid function studies: No results for input(s): "TSH", "T4TOTAL", "T3FREE", "THYROIDAB" in the last 72 hours.  Invalid input(s): "FREET3" Anemia work up: No results for input(s): "VITAMINB12", "FOLATE", "FERRITIN", "TIBC", "IRON", "RETICCTPCT" in the last 72 hours. Sepsis Labs: Recent Labs  Lab 11/06/22 0400 11/07/22 0505 11/08/22 0241 11/09/22 0500  WBC 7.3 8.8 7.6 7.3    Microbiology Recent Results (from the past 240 hour(s))  Blood culture (routine x 2)     Status: None   Collection Time: 10/31/22  2:57 PM   Specimen: BLOOD LEFT HAND  Result Value Ref Range Status   Specimen Description BLOOD LEFT HAND  Final   Special Requests   Final    BOTTLES DRAWN AEROBIC AND ANAEROBIC Blood Culture adequate volume   Culture   Final    NO GROWTH 5 DAYS Performed at [facility], [address], [place], [place]    Report Status 11/05/2022 FINAL  Final  Blood culture (routine x 2)     Status: None   Collection Time: 10/31/22  2:57 PM   Specimen: BLOOD RIGHT HAND  Result Value Ref Range Status   Specimen Description BLOOD RIGHT HAND  Final   Special Requests   Final    BOTTLES DRAWN AEROBIC ONLY Blood Culture results may not be optimal due to an inadequate volume of blood received in culture bottles    Culture   Final    NO GROWTH 5 DAYS Performed at [facility], [address], [place], [place]    Report Status 11/05/2022 FINAL  Final  MRSA Next Gen by PCR, Nasal     Status: None   Collection Time: 10/31/22 10:06 PM   Specimen: Nasal Mucosa; Nasal Swab  Result Value Ref Range Status   MRSA by PCR Next Gen NOT DETECTED NOT DETECTED Final  Comment: (NOTE) The GeneXpert MRSA Assay (FDA approved for NASAL specimens only), is one component of a comprehensive MRSA colonization surveillance program. It is not intended to diagnose MRSA infection nor to guide or monitor treatment for MRSA infections. Test performance is not FDA approved in patients less than 56 years old. Performed at [facility], [address], [place], [place] [address]      Medications:    amLODipine  5 mg Oral Daily   Chlorhexidine Gluconate Cloth  6 each Topical Daily   enoxaparin (LOVENOX) injection  40 mg Subcutaneous Q24H   multivitamin with minerals  1 tablet Oral Daily   pantoprazole  40 mg Oral BID   sucralfate  1 g Oral TID WC & HS   Continuous Infusions:  ceFEPime (MAXIPIME) IV 2 g (11/09/22 0356)   levETIRAcetam 500 mg (11/08/22 2053)   metronidazole 500 mg (11/09/22 0752)      LOS: 9 days   Marinda Elk  [facility]  11/09/2022, 8:43 AM

## 2022-11-09 NOTE — Plan of Care (Signed)
  Problem: Education: Goal: Knowledge of General Education information will improve Description: Including pain rating scale, medication(s)/side effects and non-pharmacologic comfort measures Outcome: Progressing   Problem: Activity: Goal: Risk for activity intolerance will decrease Outcome: Progressing   Problem: Nutrition: Goal: Adequate nutrition will be maintained Outcome: Progressing   

## 2022-11-09 NOTE — Plan of Care (Signed)
  Problem: Pain Managment: Goal: General experience of comfort will improve Outcome: Progressing   Problem: Safety: Goal: Ability to remain free from injury will improve Outcome: Progressing   Problem: Skin Integrity: Goal: Risk for impaired skin integrity will decrease Outcome: Progressing   

## 2022-11-10 ENCOUNTER — Telehealth: Payer: Self-pay

## 2022-11-10 DIAGNOSIS — K8050 Calculus of bile duct without cholangitis or cholecystitis without obstruction: Secondary | ICD-10-CM | POA: Diagnosis not present

## 2022-11-10 DIAGNOSIS — K251 Acute gastric ulcer with perforation: Secondary | ICD-10-CM | POA: Diagnosis not present

## 2022-11-10 DIAGNOSIS — K659 Peritonitis, unspecified: Secondary | ICD-10-CM | POA: Diagnosis not present

## 2022-11-10 LAB — CBC
HCT: 29.4 % — ABNORMAL LOW (ref 36.0–46.0)
Hemoglobin: 8.7 g/dL — ABNORMAL LOW (ref 12.0–15.0)
MCH: 24.7 pg — ABNORMAL LOW (ref 26.0–34.0)
MCHC: 29.6 g/dL — ABNORMAL LOW (ref 30.0–36.0)
MCV: 83.5 fL (ref 80.0–100.0)
Platelets: 265 10*3/uL (ref 150–400)
RBC: 3.52 MIL/uL — ABNORMAL LOW (ref 3.87–5.11)
RDW: 16 % — ABNORMAL HIGH (ref 11.5–15.5)
WBC: 6.7 10*3/uL (ref 4.0–10.5)
nRBC: 0 % (ref 0.0–0.2)

## 2022-11-10 LAB — COMPREHENSIVE METABOLIC PANEL
ALT: 8 U/L (ref 0–44)
AST: 15 U/L (ref 15–41)
Albumin: 2.3 g/dL — ABNORMAL LOW (ref 3.5–5.0)
Alkaline Phosphatase: 102 U/L (ref 38–126)
Anion gap: 4 — ABNORMAL LOW (ref 5–15)
BUN: 7 mg/dL — ABNORMAL LOW (ref 8–23)
CO2: 29 mmol/L (ref 22–32)
Calcium: 8.5 mg/dL — ABNORMAL LOW (ref 8.9–10.3)
Chloride: 105 mmol/L (ref 98–111)
Creatinine, Ser: 0.5 mg/dL (ref 0.44–1.00)
GFR, Estimated: >60 mL/min (ref >60)
Glucose, Bld: 101 mg/dL — ABNORMAL HIGH (ref 70–99)
Potassium: 3.9 mmol/L (ref 3.5–5.1)
Sodium: 138 mmol/L (ref 135–145)
Total Bilirubin: 0.1 mg/dL — ABNORMAL LOW (ref 0.3–1.2)
Total Protein: 5.1 g/dL — ABNORMAL LOW (ref 6.5–8.1)

## 2022-11-10 MED ORDER — HYDROMORPHONE HCL 1 MG/ML IJ SOLN
0.5000 mg | INTRAMUSCULAR | Status: DC | PRN
  Administered 2022-11-10: 0.5 mg via INTRAVENOUS
  Filled 2022-11-10: qty 0.5

## 2022-11-10 MED ORDER — ACETAMINOPHEN 500 MG PO TABS
1000.0000 mg | ORAL_TABLET | Freq: Four times a day (QID) | ORAL | Status: DC
  Administered 2022-11-10 – 2022-11-23 (×50): 1000 mg via ORAL
  Filled 2022-11-10 (×52): qty 2

## 2022-11-10 MED ORDER — OXYCODONE HCL 5 MG PO TABS
10.0000 mg | ORAL_TABLET | ORAL | Status: DC | PRN
  Administered 2022-11-10 – 2022-11-11 (×5): 10 mg via ORAL
  Filled 2022-11-10 (×5): qty 2

## 2022-11-10 MED ORDER — ACETAMINOPHEN 650 MG RE SUPP
650.0000 mg | Freq: Four times a day (QID) | RECTAL | Status: DC
  Administered 2022-11-23 – 2022-11-24 (×3): 650 mg via RECTAL
  Filled 2022-11-10 (×4): qty 1

## 2022-11-10 MED ORDER — HYDROMORPHONE HCL 1 MG/ML IJ SOLN
0.2500 mg | Freq: Four times a day (QID) | INTRAMUSCULAR | Status: DC | PRN
  Administered 2022-11-10 – 2022-11-13 (×10): 0.25 mg via INTRAVENOUS
  Filled 2022-11-10 (×10): qty 0.5

## 2022-11-10 MED ORDER — TIZANIDINE HCL 2 MG PO TABS
4.0000 mg | ORAL_TABLET | Freq: Three times a day (TID) | ORAL | Status: DC
  Administered 2022-11-10 – 2022-11-24 (×44): 4 mg via ORAL
  Filled 2022-11-10 (×44): qty 2

## 2022-11-10 NOTE — Telephone Encounter (Signed)
-----   Message from Beverley Fiedler, MD sent at 11/08/2022 11:57 AM EST ----- Regarding: followup Pt will need office followup with Dr. Marina Goodell Perforated gastric ulcer treated medically CBD stone(s) without obstruction.  On follow-up outpt EGD and ERCP to be considered.  Thanks [employer]

## 2022-11-10 NOTE — NC FL2 (Signed)
[place] LEVEL OF CARE SCREENING TOOL     IDENTIFICATION  Patient Name: Jamie [name] Birthdate: 06-Jul-1956 Sex: female Admission Date (Current Location): 10/31/2022  [facility] and [place] Number:  Herbalist and Address:  [facility] [place]. [facility], [place] [address], [place], [place] [address]      Provider Number: [id number]  Attending Physician Name and Address:  Charlynne Cousins, MD  Relative Name and Phone Number:       Current Level of Care: Hospital Recommended Level of Care: [place] Prior Approval Number:    Date Approved/Denied:   PASRR Number: [id number]  Discharge Plan: SNF    Current Diagnoses: Patient Active Problem List   Diagnosis Date Noted   Acute gastric ulcer with perforation ([place]) 11/07/2022   Choledocholithiasis 11/07/2022   Pressure injury of skin 11/03/2022   Peritonitis ([place]) 10/31/2022   Symptomatic bradycardia 09/29/2022   Malnutrition of moderate degree 09/29/2022   Hypotension    Physical deconditioning 07/29/2022   Shock circulatory ([place]) 07/29/2022   Chronic pain 07/28/2022   UTI (urinary tract infection) 07/28/2022   Chronic diarrhea 07/28/2022   Lactic acidosis 07/28/2022   Sinus bradycardia 07/27/2022   Seizure ([place]) 07/18/2022   Generalized weakness 05/04/2022   Hypoglycemia 05/04/2022   Hypokalemia 04/06/2022   Falls frequently 04/06/2022   Alcohol abuse 04/06/2022   Opiate dependence --Chronic pain 04/06/2022   Benzodiazepine dependence /Chronic anxiety 04/06/2022   Abdominal pain    Nausea vomiting and diarrhea 05/08/2016   Sepsis, unspecified organism ([place]) 05/08/2016   Migraine 05/08/2016   AKI (acute kidney injury) ([place]) 05/08/2016   Relative polycythemia 05/08/2016   Anxiety 05/08/2016   Sepsis due to undetermined organism ([place]) 05/08/2016    Orientation RESPIRATION BLADDER Height & Weight     Self, Time, Situation, Place  Normal Continent Weight: 126 lb 12.2  oz (57.5 kg) Height:  5\' 6"  (167.6 cm)  BEHAVIORAL SYMPTOMS/MOOD NEUROLOGICAL BOWEL NUTRITION STATUS      Continent    AMBULATORY STATUS COMMUNICATION OF NEEDS Skin   Extensive Assist Verbally Surgical wounds                       Personal Care Assistance Level of Assistance  Bathing, Dressing, Feeding Bathing Assistance: Limited assistance Feeding assistance: Independent Dressing Assistance: Limited assistance     Functional Limitations Info  Sight, Hearing, Speech Sight Info: Adequate Hearing Info: Adequate Speech Info: Adequate    SPECIAL CARE FACTORS FREQUENCY  PT (By licensed PT), OT (By licensed OT)                    Contractures Contractures Info: Not present    Additional Factors Info  Code Status Code Status Info: FULL CODE             Current Medications (11/10/2022):  This is the current hospital active medication list Current Facility-Administered Medications  Medication Dose Route Frequency Provider Last Rate Last Admin   acetaminophen (TYLENOL) tablet 650 mg  650 mg Oral Q6H PRN Kathryne Eriksson, NP   650 mg at 11/10/22 0556   Or   acetaminophen (TYLENOL) suppository 650 mg  650 mg Rectal Q6H PRN Kathryne Eriksson, NP       ALPRAZolam Duanne Moron) tablet 1 mg  1 mg Oral TID PRN Kathryne Eriksson, NP   1 mg at 11/10/22 0556   amLODipine (NORVASC) tablet 5 mg  5 mg Oral Daily  Catarina Hartshorn, MD   5 mg at 11/10/22 0845   amoxicillin-clavulanate (AUGMENTIN) 875-125 MG per tablet 1 tablet  1 tablet Oral Q12H Marinda Elk, MD   1 tablet at 11/10/22 0845   Chlorhexidine Gluconate Cloth 2 % PADS 6 each  6 each Topical Daily Meredeth Ide, MD   6 each at 11/09/22 0948   enoxaparin (LOVENOX) injection 40 mg  40 mg Subcutaneous Q24H Luiz Iron, NP   40 mg at 11/09/22 1715   hydrALAZINE (APRESOLINE) injection 10 mg  10 mg Intravenous Q6H PRN Catarina Hartshorn, MD   10 mg at 11/01/22 2127   HYDROmorphone (DILAUDID) injection 0.5 mg  0.5 mg Intravenous Q4H PRN  Marinda Elk, MD   0.5 mg at 11/10/22 0849   levETIRAcetam (KEPPRA) tablet 500 mg  500 mg Oral BID Bell, Lorin C, RPH   500 mg at 11/10/22 0845   loperamide (IMODIUM) capsule 2 mg  2 mg Oral PRN Kathlen Mody, MD   2 mg at 11/10/22 0138   multivitamin with minerals tablet 1 tablet  1 tablet Oral Daily Kathlen Mody, MD   1 tablet at 11/08/22 1119   ondansetron (ZOFRAN-ODT) disintegrating tablet 4 mg  4 mg Oral Q6H PRN Luiz Iron, NP       Or   ondansetron [facility]) injection 4 mg  4 mg Intravenous Q6H PRN Luiz Iron, NP   4 mg at 11/09/22 9449   Oral care mouth rinse  15 mL Mouth Rinse PRN Meredeth Ide, MD       oxyCODONE (Oxy IR/ROXICODONE) immediate release tablet 10 mg  10 mg Oral Q4H PRN Meredeth Ide, MD   10 mg at 11/10/22 1029   pantoprazole (PROTONIX) EC tablet 40 mg  40 mg Oral BID Dianah Field, PA-C   40 mg at 11/10/22 0845   sucralfate (CARAFATE) tablet 1 g  1 g Oral TID WC & HS Phylliss Blakes A, MD   1 g at 11/10/22 0846   tiZANidine (ZANAFLEX) tablet 4 mg  4 mg Oral TID Marinda Elk, MD   4 mg at 11/10/22 0845     Discharge Medications: Please see discharge summary for a list of discharge medications.  Relevant Imaging Results:  Relevant Lab Results:   Additional Information SS# [id number]  Deatra Robinson, [place]

## 2022-11-10 NOTE — Progress Notes (Signed)
Physical Therapy Treatment Patient Details Name: Jamie [name] MRN: [id number] DOB: 1956/10/27 Today's Date: 11/10/2022   History of Present Illness Pt is a 66 y/o female who presents 10/31/2022 from home where she was found by EMS on her couch, covered in urine and feces, unable to get up. Pt called EMS due to SOB and abdominal pain. She was found to have a probable gastric ulcer. PMH significant for anxiety, gastroenteritis, ETOH abuse.    PT Comments    Pt progressing slowly towards physical therapy goals. Session limited by abdominal pain and incontinence (both urine and bowel), requiring linen change and peri care. Pt unable to assist with any aspect of peri-care and very reliant on Purewick due to incontinence. Pt expresses desire to return home, however based on performance today, continue to recommend SNF level rehab. Pt would benefit from multidisciplinary therapies to maximize functional independence with mobility and ADL's prior to return home with limited social support. Will continue to follow.    Recommendations for follow up therapy are one component of a multi-disciplinary discharge planning process, led by the attending physician.  Recommendations may be updated based on patient status, additional functional criteria and insurance authorization.  Follow Up Recommendations  Skilled nursing-short term rehab (<3 hours/day) Can patient physically be transported by private vehicle: Yes   Assistance Recommended at Discharge Intermittent Supervision/Assistance  Patient can return home with the following A little help with walking and/or transfers;A little help with bathing/dressing/bathroom;Assistance with cooking/housework;Assist for transportation;Help with stairs or ramp for entrance   Equipment Recommendations  Other (comment) (TBD by next venue of care)    Recommendations for Other Services       Precautions / Restrictions Precautions Precautions: Fall Precaution Comments:  Premedicate for pain; frequent stools Restrictions Weight Bearing Restrictions: No     Mobility  Bed Mobility   Bed Mobility: Rolling Rolling: Modified independent (Device/Increase time)         General bed mobility comments: Increased time. Pt was able to roll R and L in bed without assistance. Bed rails utilized for pull assist.    Transfers                   General transfer comment: Focus of session was linen change and peri care. Was not able to progress OOB this session.    Ambulation/Gait                   Stairs             Wheelchair Mobility    Modified Rankin (Stroke Patients Only)       Balance Overall balance assessment: Needs assistance Sitting-balance support: Feet supported, No upper extremity supported Sitting balance-Leahy Scale: Fair     Standing balance support: During functional activity, Reliant on assistive device for balance, Bilateral upper extremity supported Standing balance-Leahy Scale: Poor                              Cognition Arousal/Alertness: Awake/alert Behavior During Therapy: WFL for tasks assessed/performed, Anxious Overall Cognitive Status: Within Functional Limits for tasks assessed                                          Exercises      General Comments        Pertinent Vitals/Pain Pain  Assessment Pain Assessment: Faces Faces Pain Scale: Hurts even more Breathing: normal Negative Vocalization: none Facial Expression: smiling or inexpressive Body Language: relaxed Consolability: no need to console PAINAD Score: 0 Pain Location: Abdomen Pain Descriptors / Indicators: Grimacing, Guarding, Moaning Pain Intervention(s): Limited activity within patient's tolerance, Monitored during session, Repositioned    Home Living                          Prior Function            PT Goals (current goals can now be found in the care plan section) Acute  Rehab PT Goals Patient Stated Goal: Decrease pain PT Goal Formulation: With patient Time For Goal Achievement: 11/22/22 Potential to Achieve Goals: Good Progress towards PT goals: Progressing toward goals    Frequency    Min 3X/week      PT Plan Current plan remains appropriate    Co-evaluation              AM-PAC PT "6 Clicks" Mobility   Outcome Measure  Help needed turning from your back to your side while in a flat bed without using bedrails?: None Help needed moving from lying on your back to sitting on the side of a flat bed without using bedrails?: A Little Help needed moving to and from a bed to a chair (including a wheelchair)?: A Little Help needed standing up from a chair using your arms (e.g., wheelchair or bedside chair)?: A Little Help needed to walk in hospital room?: A Little Help needed climbing 3-5 steps with a railing? : A Little 6 Click Score: 19    End of Session   Activity Tolerance: Patient limited by pain Patient left: in bed;with call bell/phone within reach;with bed alarm set Nurse Communication: Mobility status (Pt needs Purewick) PT Visit Diagnosis: Unsteadiness on feet (R26.81);Pain;Difficulty in walking, not elsewhere classified (R26.2) Pain - part of body:  (abdomen)     Time: 7209-4709 PT Time Calculation (min) (ACUTE ONLY): 25 min  Charges:  $Therapeutic Activity: 23-37 mins                     Jamie [name], PT, DPT Acute Rehabilitation Services Secure Chat Preferred Office: [phone]    Jamie [name] 11/10/2022, 2:38 PM

## 2022-11-10 NOTE — Telephone Encounter (Signed)
Pt scheduled to see Dr. Marina Goodell 11/29/22 at 10:20am.

## 2022-11-10 NOTE — Progress Notes (Signed)
Subjective: CC: Continued abdominal pain. Reports it is generalized, most in upper abdominal pain, particularly in the LUQ. Some nausea. No vomiting. Thinks may be worse with po intake. Tolerating diet though and eating all her trays. Asking to be advanced to regular diet. BM yesterday, loose.   Objective: Vital signs in last 24 hours: Temp:  [98 F (36.7 C)-99.2 F (37.3 C)] 98 F (36.7 C) (11/16 0935) Pulse Rate:  [68-81] 68 (11/16 0935) Resp:  [16-18] 16 (11/16 0935) BP: (104-133)/(54-73) 104/54 (11/16 0935) SpO2:  [94 %-97 %] 95 % (11/16 0935) Last BM Date : 11/09/22  Intake/Output from previous day: 11/15 0701 - 11/16 0700 In: 927.1 [P.O.:480; IV Piggyback:447.1] Out: 3850 [Urine:3850] Intake/Output this shift: No intake/output data recorded.  PE: Gen:  Alert, NAD, pleasant Abd: Soft, mild distension, generalized ttp greatest on the left that seems similar to prior exams. No rigidity or guarding. +BS  Lab Results:  Recent Labs    11/08/22 0241 11/09/22 0500  WBC 7.6 7.3  HGB 8.7* 8.9*  HCT 29.4* 28.6*  PLT 288 272   BMET Recent Labs    11/08/22 0241 11/09/22 0500  NA 137 139  K 4.1 3.7  CL 104 104  CO2 30 30  GLUCOSE 102* 111*  BUN 7* 8  CREATININE 0.62 0.60  CALCIUM 8.4* 8.5*   PT/INR No results for input(s): "LABPROT", "INR" in the last 72 hours. CMP     Component Value Date/Time   NA 139 11/09/2022 0500   K 3.7 11/09/2022 0500   CL 104 11/09/2022 0500   CO2 30 11/09/2022 0500   GLUCOSE 111 (H) 11/09/2022 0500   BUN 8 11/09/2022 0500   CREATININE 0.60 11/09/2022 0500   CALCIUM 8.5 (L) 11/09/2022 0500   PROT 4.8 (L) 11/09/2022 0500   ALBUMIN 2.2 (L) 11/09/2022 0500   AST 11 (L) 11/09/2022 0500   ALT 7 11/09/2022 0500   ALKPHOS 108 11/09/2022 0500   BILITOT 0.3 11/09/2022 0500   GFRNONAA >60 11/09/2022 0500   GFRAA >60 07/04/2020 1636   Lipase     Component Value Date/Time   LIPASE 24 09/29/2022 0024    Studies/Results: CT  ABDOMEN PELVIS W CONTRAST  Result Date: 11/09/2022 CLINICAL DATA:  Epigastric pain EXAM: CT ABDOMEN AND PELVIS WITH CONTRAST TECHNIQUE: Multidetector CT imaging of the abdomen and pelvis was performed using the standard protocol following bolus administration of intravenous contrast. RADIATION DOSE REDUCTION: This exam was performed according to the departmental dose-optimization program which includes automated exposure control, adjustment of the mA and/or kV according to patient size and/or use of iterative reconstruction technique. CONTRAST:  58mL OMNIPAQUE IOHEXOL 350 MG/ML SOLN COMPARISON:  Previous study done on 11/05/2022 FINDINGS: Lower chest: Small bilateral pleural effusions are seen, more so on the right side. There are infiltrates in both lower lung fields suggesting atelectasis/pneumonia. Hepatobiliary: There is slight prominence of intrahepatic bile ducts. Common bile duct measures 9 mm in diameter. In image 28 of series 4, there is 5 mm radiopacity in the lumen of distal common bile duct close to the ampulla. Gallbladder is not distended. There is no wall thickening in gallbladder. Pancreas: There is prominence of pancreatic duct. No new focal abnormalities are seen. Spleen: Spleen measures 13 cm in maximum diameter. Adrenals/Urinary Tract: Adrenals are unremarkable. There is no hydronephrosis. There are no renal or ureteral stones. There is 11 mm smooth marginated low-density structure in the upper pole of left kidney suggesting possible renal  cyst. No follow-up imaging is recommended. Urinary bladder is unremarkable. Stomach/Bowel: Stomach is not distended. Wall thickening seen in the antrum of the stomach in the previous examination is not evident in the current study. There is no significant small bowel dilation. Oral contrast has reached the rectum. Appendix is not distinctly seen. There is no pericecal inflammation. There is no significant wall thickening in colon. Vascular/Lymphatic:  Scattered arterial calcifications are seen. Reproductive: Uterus appears smaller than usual. There are no adnexal masses. Other: There is almost complete interval clearing of ascites. There is trace amount of fluid in perihepatic region. There is minimal stranding in the fat planes posterior to the rectum with interval decrease. This may be residual part of anasarca. There is interval increase in amount of subcutaneous fluid in abdominal wall suggesting anasarca. Musculoskeletal: There is laminectomy and fusion at L5-S1 level. Schmorl's node is seen in the upper endplate of body of [id number] vertebra. There is first-degree spondylolisthesis at L4-L5 level. IMPRESSION: There is dilation of bile ducts. There is 5 mm radiopacity in the lumen of distal common bile duct close to the ampulla. Findings suggest possible choledocholithiasis. There are no signs of acute cholecystitis. Follow-up MRCP as clinically warranted may be considered. Asymmetric wall thickening in the antrum of the stomach seen in the previous study is not evident in the current study. There is almost complete interval clearing of ascites. There is no evidence of intestinal obstruction or pneumoperitoneum. There is no hydronephrosis. Small bilateral pleural effusions. There are linear infiltrates in the lower lung fields suggesting atelectasis. There is interval increase in amount of subcutaneous edema suggesting anasarca. Enlarged spleen.  Small left renal cyst.  Lumbar spondylosis. Other findings as described in the body of the report. Electronically Signed   By: Elmer Picker M.D.   On: 11/09/2022 16:52    Anti-infectives: Anti-infectives (From admission, onward)    Start     Dose/Rate Route Frequency Ordered Stop   11/09/22 1000  amoxicillin-clavulanate (AUGMENTIN) 875-125 MG per tablet 1 tablet        1 tablet Oral Every 12 hours 11/09/22 0847     11/07/22 0800  metroNIDAZOLE (FLAGYL) IVPB 500 mg  Status:  Discontinued        500 mg 100  mL/hr over 60 Minutes Intravenous Every 12 hours 11/06/22 2001 11/09/22 0847   11/07/22 0300  ceFEPIme (MAXIPIME) 2 g in sodium chloride 0.9 % 100 mL IVPB  Status:  Discontinued        2 g 200 mL/hr over 30 Minutes Intravenous Every 8 hours 11/06/22 1948 11/09/22 0847   11/02/22 1000  ceFEPIme (MAXIPIME) 2 g in sodium chloride 0.9 % 100 mL IVPB  Status:  Discontinued        2 g 200 mL/hr over 30 Minutes Intravenous Every 8 hours 11/02/22 0754 11/06/22 1908   11/01/22 0600  metroNIDAZOLE (FLAGYL) IVPB 500 mg  Status:  Discontinued        500 mg 100 mL/hr over 60 Minutes Intravenous Every 12 hours 10/31/22 1747 11/06/22 1859   10/31/22 2200  ceFEPIme (MAXIPIME) 2 g in sodium chloride 0.9 % 100 mL IVPB  Status:  Discontinued        2 g 200 mL/hr over 30 Minutes Intravenous Every 12 hours 10/31/22 1757 11/02/22 0754   10/31/22 1600  metroNIDAZOLE (FLAGYL) IVPB 500 mg        500 mg 100 mL/hr over 60 Minutes Intravenous  Once 10/31/22 1546 10/31/22 1759   10/31/22 1445  ceFEPIme (MAXIPIME) 2 g in sodium chloride 0.9 % 100 mL IVPB        2 g 200 mL/hr over 30 Minutes Intravenous  Once 10/31/22 1440 10/31/22 1643        Assessment/Plan Possible perforated gastric ulcer - Noted on initial scan at [facility] 11/6 with foci of free air in the upper abdomen with wall thickening of both the gastric cardia and distal stomach concerning for gastric perforation.  Transferred to [facility] for choledocholithiasis.  Repeat CT 11/11 with asymmetric wall thickening of the gastric antrum but resolution of free intraperitoneal gas - UGI with no evidence of extraluminal contrast leak.  - CT 11/15 w/ asymmetric wall thickening in the antrum of the stomach seen on the previous study is not evident with almost complete interval clearing of ascites. - Cont PPI BID and Carafate - H. Pylori is neg.  - GI already planning f/u for EGD to evaluate this as outpatient  - With reassuring imaging x 3 as noted above, patient now  tolerating a diet, no peritonitis on exam, HDS (afebrile, no tachycardia or systolic hypotension) and last wbc wnl I do not think she needs anything done surgically for this currently. Recommend 14d total of abx, continuing PPI + Carafaate and avoiding any NSAID or alcohol use, w/ f/u Dr. Lovell Sheehan and GI as note above.    Choledocholithiasis  - Noted on MRCP w/ 2 small stones mid duct measuring up to 4 mm, common bile duct measures 12 mm. GI has evaluated. Not planning ERCP w/ possible perforated gastric ulcer. They suspect this is more chronic then acute with no elevated of LFT's. - MRCP also did note sludge in gallbladder with pericholecystitic fluid. Question if pericholecystitic fluid was actually from perf ulcer. No signs of Cholecystitis on CT 11/15. WBC wnl and LFT's non-elevated on last check. Repeat labs pending.  - CT did show 5 mm radiopacity in the lumen of distal common bile duct close to the ampulla concerning for choledocholithiasis. Would defer management of this to GI. Again repeat LFT's are pending. It appears they have signed off. May need to be re-engage/touched base with them. - Will discuss with MD but currently no plans for Cholecystectomy during admission. She should f/u with Dr. Lovell Sheehan for above.    FEN - Okay for Reg diet, IVF per primary VTE - SCDs, okay for chem ppx from a general surgery standpoint ID - Cefepime/Flagyl 11/6 - 11/15. On Augmentin.    Pancreatic duct dilation  Chronic anemia Hx seizure disorder Opiate dependence      LOS: 10 days    Jacinto Halim , [facility] 11/10/2022, 10:06 AM Please see Amion for pager number during day hours 7:00am-4:30pm

## 2022-11-10 NOTE — Progress Notes (Addendum)
[facility] CONSULT PROGRESS NOTE    Progress Note  Jamie [name]  BOF:[id number] DOB: August 08, 1956 DOA: 10/31/2022 PCP: Barbie Banner, MD     Brief Narrative:   Jamie [name] is an 66 y.o. female past medical history of anxiety alcohol abuse in remission, chronic back pain, history of seizure disorder and discharged from the hospital on October 04, 2022 she was treated for sepsis secondary to pneumonia treated with pressors hospital stay was complicated by seizures comes in again for right upper quadrant abdominal pain that began on 10/29/2022 in the ED CT was done that showed free air in the abdomen, was found to be shock in the perforated viscus and peritonitis, surgery was consulted.  CT also showed intrahepatic and extrahepatic duct dilation with a CBD stone and pancreatic atrophy with pancreatic duct dilation she was started empirically on cefepime and Flagyl.  Now on augmentin.  Clinically stable to be discharged to skilled nursing facility   Assessment/Plan:   Septic shock secondary to peritonitis due to perforated peptic ulcer in the setting of BC powder use: Sepsis now resolved. Off pressors, continue cefepime and Flagyl per surgery. GI has been consulted recommended Gastrografin that showed no contrast extravasation. Pt eval the patient, will need SNF placement. She was complaining of abdominal pain CT of the abdomen and pelvis showed there is a dilation of the bile duct with a 5 mm radiopacity in the distal common bile duct ampulla. We will notify GI, she will be high risk for ERCP due to her recent perforated viscus. Further management per surgery.  She relates her pain is not controlled with narcotics.  Acute perforated peptic ulcer disease peritonitis [facility]) Patient had been using Goody powder due to rheumatoid arthritis. General surgery felt microperforation has sealed. Now complaining of abdominal pain. Conservative management for now, will change her to oral  Augmentin.  Choledocholithiasis biliary extraperitoneal hepatic ductal dilation: MRCP showed ductal dilation to small stone measuring 4 mm with mildly dilated gallbladder and pericholecystic fluid to go to go for acute cholecystitis. Likely chronic than acute elevation of LFTs. Will notify GI of the results.  Pancreatic ductal dilation: Her CBC was done that showed similar appearing pancreatic duct in 2017 unchanged from previous.  Acute on chronic blood loss anemia No signs of overt bleeding. Continue PPI twice a day and Carafate. Hemoglobin has remained stable .  History of seizure disorder: Unprovoked change Keppra to oral.  Sinus bradycardia: Has resolved.  Opiate dependence: Continue current regimen pain seems to be controlled.  Anxiety: Continue Xanax 3 times a day seems to be stable.  Hypokalemia: Replete improved.  Sacral cubitus ulcer stage II present on admission RN Pressure Injury Documentation: Pressure Injury 10/31/22 Sacrum Medial Stage 2 -  Partial thickness loss of dermis presenting as a shallow open injury with a red, pink wound bed without slough. (Active)  10/31/22 2322  Location: Sacrum  Location Orientation: Medial  Staging: Stage 2 -  Partial thickness loss of dermis presenting as a shallow open injury with a red, pink wound bed without slough.  Wound Description (Comments):   Present on Admission: Yes  Dressing Type Foam - Lift dressing to assess site every shift 11/09/22 2048    DVT prophylaxis: lovenox Family Communication:non4 Status is: Inpatient Remains inpatient appropriate because: Acute perforated peptic ulcer disease with peritonitis    Code Status:     Code Status Orders  (From admission, onward)           Start  Ordered   10/31/22 1734  Full code  Continuous        10/31/22 1747           Code Status History     Date Active Date Inactive Code Status Order ID Comments User Context   09/29/2022 0431 10/04/2022  2211 Full Code [id number]  Tim Lair, PA-C ED   07/27/2022 2304 07/30/2022 2000 Full Code [id number]  Lilyan Gilford, DO Inpatient   07/18/2022 2114 07/23/2022 0012 Full Code [id number]  Shon Hale, MD ED   09/21/2016 0441 09/21/2016 1627 Full Code [id number]  Devoria Albe, MD ED   05/08/2016 2307 05/10/2016 1521 Full Code [id number]  Briscoe Deutscher, MD ED         IV Access:   Peripheral IV   Procedures and diagnostic studies:   CT ABDOMEN PELVIS W CONTRAST  Result Date: 11/09/2022 CLINICAL DATA:  Epigastric pain EXAM: CT ABDOMEN AND PELVIS WITH CONTRAST TECHNIQUE: Multidetector CT imaging of the abdomen and pelvis was performed using the standard protocol following bolus administration of intravenous contrast. RADIATION DOSE REDUCTION: This exam was performed according to the departmental dose-optimization program which includes automated exposure control, adjustment of the mA and/or kV according to patient size and/or use of iterative reconstruction technique. CONTRAST:  74mL OMNIPAQUE IOHEXOL 350 MG/ML SOLN COMPARISON:  Previous study done on 11/05/2022 FINDINGS: Lower chest: Small bilateral pleural effusions are seen, more so on the right side. There are infiltrates in both lower lung fields suggesting atelectasis/pneumonia. Hepatobiliary: There is slight prominence of intrahepatic bile ducts. Common bile duct measures 9 mm in diameter. In image 28 of series 4, there is 5 mm radiopacity in the lumen of distal common bile duct close to the ampulla. Gallbladder is not distended. There is no wall thickening in gallbladder. Pancreas: There is prominence of pancreatic duct. No new focal abnormalities are seen. Spleen: Spleen measures 13 cm in maximum diameter. Adrenals/Urinary Tract: Adrenals are unremarkable. There is no hydronephrosis. There are no renal or ureteral stones. There is 11 mm smooth marginated low-density structure in the upper pole of left kidney suggesting possible renal  cyst. No follow-up imaging is recommended. Urinary bladder is unremarkable. Stomach/Bowel: Stomach is not distended. Wall thickening seen in the antrum of the stomach in the previous examination is not evident in the current study. There is no significant small bowel dilation. Oral contrast has reached the rectum. Appendix is not distinctly seen. There is no pericecal inflammation. There is no significant wall thickening in colon. Vascular/Lymphatic: Scattered arterial calcifications are seen. Reproductive: Uterus appears smaller than usual. There are no adnexal masses. Other: There is almost complete interval clearing of ascites. There is trace amount of fluid in perihepatic region. There is minimal stranding in the fat planes posterior to the rectum with interval decrease. This may be residual part of anasarca. There is interval increase in amount of subcutaneous fluid in abdominal wall suggesting anasarca. Musculoskeletal: There is laminectomy and fusion at L5-S1 level. Schmorl's node is seen in the upper endplate of body of T12 vertebra. There is first-degree spondylolisthesis at L4-L5 level. IMPRESSION: There is dilation of bile ducts. There is 5 mm radiopacity in the lumen of distal common bile duct close to the ampulla. Findings suggest possible choledocholithiasis. There are no signs of acute cholecystitis. Follow-up MRCP as clinically warranted may be considered. Asymmetric wall thickening in the antrum of the stomach seen in the previous study is not evident in the current study. There is almost complete  interval clearing of ascites. There is no evidence of intestinal obstruction or pneumoperitoneum. There is no hydronephrosis. Small bilateral pleural effusions. There are linear infiltrates in the lower lung fields suggesting atelectasis. There is interval increase in amount of subcutaneous edema suggesting anasarca. Enlarged spleen.  Small left renal cyst.  Lumbar spondylosis. Other findings as described  in the body of the report. Electronically Signed   By: Ernie Avena M.D.   On: 11/09/2022 16:52     Medical Consultants:   None.   Subjective:    COLITA FENECH relates her abdominal pain is improved.  Objective:    Vitals:   11/09/22 0821 11/09/22 1742 11/09/22 2005 11/10/22 0621  BP: 131/61 133/69 119/64 118/73  Pulse: 89 80 81 75  Resp: 18 18 18 18   Temp: 98 F (36.7 C) 99.2 F (37.3 C) 98.5 F (36.9 C) 99.2 F (37.3 C)  TempSrc: Oral Oral Oral Oral  SpO2: 99% 97% 97% 94%  Weight:      Height:       SpO2: 94 % O2 Flow Rate (L/min): 2 L/min   Intake/Output Summary (Last 24 hours) at 11/10/2022 0720 Last data filed at 11/10/2022 0622 Gross per 24 hour  Intake 927.14 ml  Output 3850 ml  Net -2922.86 ml    Filed Weights   10/31/22 1738 10/31/22 2200  Weight: 59.9 kg 57.5 kg    Exam: General exam: In no acute distress. Respiratory system: Good air movement and clear to auscultation. Cardiovascular system: S1 & S2 heard, RRR. No JVD. Gastrointestinal system: Abdomen is nondistended, soft and nontender.  Extremities: No pedal edema. Skin: No rashes, lesions or ulcers Psychiatry: Judgement and insight appear normal. Mood & affect appropriate.   Data Reviewed:    Labs: Basic Metabolic Panel: Recent Labs  Lab 11/05/22 1610 11/06/22 0400 11/07/22 0505 11/08/22 0241 11/09/22 0500  NA 136 138 139 137 139  K 3.4* 3.2* 3.1* 4.1 3.7  CL 100 101 102 104 104  CO2 31 30 31 30 30   GLUCOSE 92 92 92 102* 111*  BUN <5* <5* <5* 7* 8  CREATININE 0.51 0.53 0.55 0.62 0.60  CALCIUM 8.5* 8.6* 8.4* 8.4* 8.5*    GFR Estimated Creatinine Clearance: 61.1 mL/min (by C-G formula based on SCr of 0.6 mg/dL). Liver Function Tests: Recent Labs  Lab 11/05/22 1610 11/06/22 0400 11/07/22 0505 11/08/22 0241 11/09/22 0500  AST 9* 9* 10* 11* 11*  ALT 9 11 9 7 7   ALKPHOS 112 107 95 106 108  BILITOT 0.2* 0.3 0.1* 0.3 0.3  PROT 5.1* 4.9* 5.0* 4.9* 4.8*  ALBUMIN  2.4* 2.3* 2.3* 2.3* 2.2*    No results for input(s): "LIPASE", "AMYLASE" in the last 168 hours. No results for input(s): "AMMONIA" in the last 168 hours. Coagulation profile No results for input(s): "INR", "PROTIME" in the last 168 hours. COVID-19 Labs  No results for input(s): "DDIMER", "FERRITIN", "LDH", "CRP" in the last 72 hours.  Lab Results  Component Value Date   SARSCOV2NAA NEGATIVE 05/04/2022   SARSCOV2NAA NEGATIVE 05/19/2021    CBC: Recent Labs  Lab 11/05/22 1610 11/06/22 0400 11/07/22 0505 11/08/22 0241 11/09/22 0500  WBC 7.9 7.3 8.8 7.6 7.3  NEUTROABS 4.6 4.4 5.2 4.2 4.2  HGB 9.4* 9.1* 9.0* 8.7* 8.9*  HCT 30.6* 30.1* 29.4* 29.4* 28.6*  MCV 82.0 82.2 81.2 83.8 82.2  PLT 309 284 309 288 272    Cardiac Enzymes: No results for input(s): "CKTOTAL", "CKMB", "CKMBINDEX", "TROPONINI" in the last  168 hours. BNP (last 3 results) No results for input(s): "PROBNP" in the last 8760 hours. CBG: No results for input(s): "GLUCAP" in the last 168 hours. D-Dimer: No results for input(s): "DDIMER" in the last 72 hours. Hgb A1c: No results for input(s): "HGBA1C" in the last 72 hours. Lipid Profile: No results for input(s): "CHOL", "HDL", "LDLCALC", "TRIG", "CHOLHDL", "LDLDIRECT" in the last 72 hours. Thyroid function studies: No results for input(s): "TSH", "T4TOTAL", "T3FREE", "THYROIDAB" in the last 72 hours.  Invalid input(s): "FREET3" Anemia work up: No results for input(s): "VITAMINB12", "FOLATE", "FERRITIN", "TIBC", "IRON", "RETICCTPCT" in the last 72 hours. Sepsis Labs: Recent Labs  Lab 11/06/22 0400 11/07/22 0505 11/08/22 0241 11/09/22 0500  WBC 7.3 8.8 7.6 7.3    Microbiology Recent Results (from the past 240 hour(s))  Blood culture (routine x 2)     Status: None   Collection Time: 10/31/22  2:57 PM   Specimen: BLOOD LEFT HAND  Result Value Ref Range Status   Specimen Description BLOOD LEFT HAND  Final   Special Requests   Final    BOTTLES DRAWN  AEROBIC AND ANAEROBIC Blood Culture adequate volume   Culture   Final    NO GROWTH 5 DAYS Performed at [facility], [address], [place], [place] [address]    Report Status 11/05/2022 FINAL  Final  Blood culture (routine x 2)     Status: None   Collection Time: 10/31/22  2:57 PM   Specimen: BLOOD RIGHT HAND  Result Value Ref Range Status   Specimen Description BLOOD RIGHT HAND  Final   Special Requests   Final    BOTTLES DRAWN AEROBIC ONLY Blood Culture results may not be optimal due to an inadequate volume of blood received in culture bottles   Culture   Final    NO GROWTH 5 DAYS Performed at [facility], [address], [place], [place] [address]    Report Status 11/05/2022 FINAL  Final  MRSA Next Gen by PCR, Nasal     Status: None   Collection Time: 10/31/22 10:06 PM   Specimen: Nasal Mucosa; Nasal Swab  Result Value Ref Range Status   MRSA by PCR Next Gen NOT DETECTED NOT DETECTED Final    Comment: (NOTE) The GeneXpert MRSA Assay (FDA approved for NASAL specimens only), is one component of a comprehensive MRSA colonization surveillance program. It is not intended to diagnose MRSA infection nor to guide or monitor treatment for MRSA infections. Test performance is not FDA approved in patients less than 27 years old. Performed at [facility], [address] [place], [place] [address]      Medications:    amLODipine  5 mg Oral Daily   amoxicillin-clavulanate  1 tablet Oral Q12H   Chlorhexidine Gluconate Cloth  6 each Topical Daily   enoxaparin (LOVENOX) injection  40 mg Subcutaneous Q24H   levETIRAcetam  500 mg Oral BID   multivitamin with minerals  1 tablet Oral Daily   pantoprazole  40 mg Oral BID   sucralfate  1 g Oral TID WC & HS   Continuous Infusions:      LOS: 10 days   Marinda Elk  [facility]  11/10/2022, 7:20 AM

## 2022-11-10 NOTE — Plan of Care (Signed)

## 2022-11-10 NOTE — Progress Notes (Addendum)
SW spoke with pt re PT recommendation for SNF 11/14. Pt prefers to dc home with [facility] but willing to consider SNF placement if unable to ambulate at time of dc. Will need updated PT/OT notes for insurance auth. SNF search started, will f/u with offers as available.    UPDATE 1545: provided current SNF offers to pt who plans to discuss options with her dtr this evening before deciding. SW will f/u in the AM.  Dellie Burns, MSW, LCSW [phone] (coverage)

## 2022-11-11 DIAGNOSIS — K659 Peritonitis, unspecified: Secondary | ICD-10-CM | POA: Diagnosis not present

## 2022-11-11 LAB — PREALBUMIN: Prealbumin: 18 mg/dL (ref 18–38)

## 2022-11-11 MED ORDER — OXYCODONE HCL 5 MG PO TABS
15.0000 mg | ORAL_TABLET | ORAL | Status: DC | PRN
  Administered 2022-11-11 – 2022-11-14 (×16): 15 mg via ORAL
  Filled 2022-11-11 (×16): qty 3

## 2022-11-11 NOTE — Progress Notes (Signed)
Subjective: CC: Continued LUQ abdominal pain. Some nausea. No vomiting. Thinks may be worse with po intake. Tolerating diet though and eating all her trays. Notes diarrhea yesterday.   Objective: Vital signs in last 24 hours: Temp:  [97.3 F (36.3 C)-99.5 F (37.5 C)] 99.5 F (37.5 C) (11/17 0744) Pulse Rate:  [68-79] 74 (11/17 0744) Resp:  [16-18] 17 (11/17 0744) BP: (102-114)/(47-54) 114/49 (11/17 0744) SpO2:  [95 %-100 %] 100 % (11/17 0744) Last BM Date : 11/09/22  Intake/Output from previous day: 11/16 0701 - 11/17 0700 In: 5 [P.O.:5] Out: 300 [Urine:300] Intake/Output this shift: No intake/output data recorded.  PE: Gen:  Alert, NAD, pleasant Abd: Soft, mild distension, generalized ttp greatest on the left that seems similar to prior exams. No rigidity or guarding. +BS  Lab Results:  Recent Labs    11/09/22 0500 11/10/22 1200  WBC 7.3 6.7  HGB 8.9* 8.7*  HCT 28.6* 29.4*  PLT 272 265    BMET Recent Labs    11/09/22 0500 11/10/22 1200  NA 139 138  K 3.7 3.9  CL 104 105  CO2 30 29  GLUCOSE 111* 101*  BUN 8 7*  CREATININE 0.60 0.50  CALCIUM 8.5* 8.5*    PT/INR No results for input(s): "LABPROT", "INR" in the last 72 hours. CMP     Component Value Date/Time   NA 138 11/10/2022 1200   K 3.9 11/10/2022 1200   CL 105 11/10/2022 1200   CO2 29 11/10/2022 1200   GLUCOSE 101 (H) 11/10/2022 1200   BUN 7 (L) 11/10/2022 1200   CREATININE 0.50 11/10/2022 1200   CALCIUM 8.5 (L) 11/10/2022 1200   PROT 5.1 (L) 11/10/2022 1200   ALBUMIN 2.3 (L) 11/10/2022 1200   AST 15 11/10/2022 1200   ALT 8 11/10/2022 1200   ALKPHOS 102 11/10/2022 1200   BILITOT 0.1 (L) 11/10/2022 1200   GFRNONAA >60 11/10/2022 1200   GFRAA >60 07/04/2020 1636   Lipase     Component Value Date/Time   LIPASE 24 09/29/2022 0024    Studies/Results: CT ABDOMEN PELVIS W CONTRAST  Result Date: 11/09/2022 CLINICAL DATA:  Epigastric pain EXAM: CT ABDOMEN AND PELVIS WITH  CONTRAST TECHNIQUE: Multidetector CT imaging of the abdomen and pelvis was performed using the standard protocol following bolus administration of intravenous contrast. RADIATION DOSE REDUCTION: This exam was performed according to the departmental dose-optimization program which includes automated exposure control, adjustment of the mA and/or kV according to patient size and/or use of iterative reconstruction technique. CONTRAST:  54mL OMNIPAQUE IOHEXOL 350 MG/ML SOLN COMPARISON:  Previous study done on 11/05/2022 FINDINGS: Lower chest: Small bilateral pleural effusions are seen, more so on the right side. There are infiltrates in both lower lung fields suggesting atelectasis/pneumonia. Hepatobiliary: There is slight prominence of intrahepatic bile ducts. Common bile duct measures 9 mm in diameter. In image 28 of series 4, there is 5 mm radiopacity in the lumen of distal common bile duct close to the ampulla. Gallbladder is not distended. There is no wall thickening in gallbladder. Pancreas: There is prominence of pancreatic duct. No new focal abnormalities are seen. Spleen: Spleen measures 13 cm in maximum diameter. Adrenals/Urinary Tract: Adrenals are unremarkable. There is no hydronephrosis. There are no renal or ureteral stones. There is 11 mm smooth marginated low-density structure in the upper pole of left kidney suggesting possible renal cyst. No follow-up imaging is recommended. Urinary bladder is unremarkable. Stomach/Bowel: Stomach is not distended. Wall thickening seen  in the antrum of the stomach in the previous examination is not evident in the current study. There is no significant small bowel dilation. Oral contrast has reached the rectum. Appendix is not distinctly seen. There is no pericecal inflammation. There is no significant wall thickening in colon. Vascular/Lymphatic: Scattered arterial calcifications are seen. Reproductive: Uterus appears smaller than usual. There are no adnexal masses.  Other: There is almost complete interval clearing of ascites. There is trace amount of fluid in perihepatic region. There is minimal stranding in the fat planes posterior to the rectum with interval decrease. This may be residual part of anasarca. There is interval increase in amount of subcutaneous fluid in abdominal wall suggesting anasarca. Musculoskeletal: There is laminectomy and fusion at L5-S1 level. Schmorl's node is seen in the upper endplate of body of T12 vertebra. There is first-degree spondylolisthesis at L4-L5 level. IMPRESSION: There is dilation of bile ducts. There is 5 mm radiopacity in the lumen of distal common bile duct close to the ampulla. Findings suggest possible choledocholithiasis. There are no signs of acute cholecystitis. Follow-up MRCP as clinically warranted may be considered. Asymmetric wall thickening in the antrum of the stomach seen in the previous study is not evident in the current study. There is almost complete interval clearing of ascites. There is no evidence of intestinal obstruction or pneumoperitoneum. There is no hydronephrosis. Small bilateral pleural effusions. There are linear infiltrates in the lower lung fields suggesting atelectasis. There is interval increase in amount of subcutaneous edema suggesting anasarca. Enlarged spleen.  Small left renal cyst.  Lumbar spondylosis. Other findings as described in the body of the report. Electronically Signed   By: Ernie Avena M.D.   On: 11/09/2022 16:52    Anti-infectives: Anti-infectives (From admission, onward)    Start     Dose/Rate Route Frequency Ordered Stop   11/09/22 1000  amoxicillin-clavulanate (AUGMENTIN) 875-125 MG per tablet 1 tablet        1 tablet Oral Every 12 hours 11/09/22 0847     11/07/22 0800  metroNIDAZOLE (FLAGYL) IVPB 500 mg  Status:  Discontinued        500 mg 100 mL/hr over 60 Minutes Intravenous Every 12 hours 11/06/22 2001 11/09/22 0847   11/07/22 0300  ceFEPIme (MAXIPIME) 2 g in  sodium chloride 0.9 % 100 mL IVPB  Status:  Discontinued        2 g 200 mL/hr over 30 Minutes Intravenous Every 8 hours 11/06/22 1948 11/09/22 0847   11/02/22 1000  ceFEPIme (MAXIPIME) 2 g in sodium chloride 0.9 % 100 mL IVPB  Status:  Discontinued        2 g 200 mL/hr over 30 Minutes Intravenous Every 8 hours 11/02/22 0754 11/06/22 1908   11/01/22 0600  metroNIDAZOLE (FLAGYL) IVPB 500 mg  Status:  Discontinued        500 mg 100 mL/hr over 60 Minutes Intravenous Every 12 hours 10/31/22 1747 11/06/22 1859   10/31/22 2200  ceFEPIme (MAXIPIME) 2 g in sodium chloride 0.9 % 100 mL IVPB  Status:  Discontinued        2 g 200 mL/hr over 30 Minutes Intravenous Every 12 hours 10/31/22 1757 11/02/22 0754   10/31/22 1600  metroNIDAZOLE (FLAGYL) IVPB 500 mg        500 mg 100 mL/hr over 60 Minutes Intravenous  Once 10/31/22 1546 10/31/22 1759   10/31/22 1445  ceFEPIme (MAXIPIME) 2 g in sodium chloride 0.9 % 100 mL IVPB  2 g 200 mL/hr over 30 Minutes Intravenous  Once 10/31/22 1440 10/31/22 1643        Assessment/Plan Possible perforated gastric ulcer - Noted on initial scan at [facility] 11/6 with foci of free air in the upper abdomen with wall thickening of both the gastric cardia and distal stomach concerning for gastric perforation.  Transferred to [facility] for choledocholithiasis.  Repeat CT 11/11 with asymmetric wall thickening of the gastric antrum but resolution of free intraperitoneal gas - UGI with no evidence of extraluminal contrast leak.  - CT 11/15 w/ asymmetric wall thickening in the antrum of the stomach seen on the previous study is not evident with almost complete interval clearing of ascites. - Cont PPI BID and Carafate - H. Pylori is neg.  - GI already planning f/u for EGD to evaluate this as outpatient  - With reassuring imaging x 3 as noted above, patient now tolerating a diet, no peritonitis on exam, HDS (afebrile, no tachycardia or systolic hypotension) and last wbc wnl I do not  think she needs anything done surgically for this currently. Recommend 14d total of abx, continuing PPI + Carafaate and avoiding any NSAID or alcohol use, w/ f/u Dr. Lovell Sheehan and GI as note above. Discussed with [facility]   Choledocholithiasis  - Noted on MRCP w/ 2 small stones mid duct measuring up to 4 mm, common bile duct measures 12 mm. GI has evaluated. Not planning ERCP w/ possible perforated gastric ulcer. They suspect this is more chronic then acute with no elevated of LFT's. - MRCP also did note sludge in gallbladder with pericholecystitic fluid. Question if pericholecystitic fluid was actually from perf ulcer. No signs of Cholecystitis on CT 11/15. WBC wnl and LFT's non-elevated on last check. Repeat labs pending.  - CT 11/15 did show 5 mm radiopacity in the lumen of distal common bile duct close to the ampulla concerning for choledocholithiasis. Would defer management of this to GI. It appears they have signed off. May need to be re-engage/touched base with them so they are aware of results. LFTs remained wnl yesterday after CT scan was performed. - Discussed case with attending. No plans for Cholecystectomy during admission. She should f/u with Dr. Lovell Sheehan and GI for above.    FEN - Okay for Reg diet, IVF per primary VTE - SCDs, okay for chem ppx from a general surgery standpoint ID - Cefepime/Flagyl 11/6 - 11/15. On Augmentin.  Dispo - No plans for any surgery at this time. Our team will sign off. Discussed recommendations to wean IV pain medications back to baseline PO pain medications with primary. Dispo per their team. Appears therapies are recommending SNF. Recommendations for tx and f/u as noted above.    Pancreatic duct dilation - recommend outpatient f/u Chronic anemia Hx seizure disorder Opiate dependence      LOS: 11 days    Jacinto Halim , [facility] 11/11/2022, 9:35 AM Please see Amion for pager number during day hours 7:00am-4:30pm

## 2022-11-11 NOTE — Progress Notes (Signed)
Spoke to pt re SNF choice and she has accepted [facility]. [facility] and she will start auth today. Anticipate determination from [place] next week. SW will follow.   Dellie Burns, MSW, LCSW [phone] (coverage)

## 2022-11-11 NOTE — Progress Notes (Signed)
IJ CVC removed per order.  Pressure held to achieve hemostasis.  Vaseline/gauze/tegaderm applied and aftercare instructions reviewed with patient.

## 2022-11-11 NOTE — Plan of Care (Signed)

## 2022-11-11 NOTE — Progress Notes (Signed)
[facility] CONSULT PROGRESS NOTE    Progress Note  Jamie [name]  WVP:[id number] DOB: 1956-07-02 DOA: 10/31/2022 PCP: Barbie Banner, MD     Brief Narrative:   Jamie [name] is an 66 y.o. female past medical history of anxiety alcohol abuse in remission, chronic back pain, history of seizure disorder and discharged from the hospital on October 04, 2022 she was treated for sepsis secondary to pneumonia treated with pressors hospital stay was complicated by seizures comes in again for right upper quadrant abdominal pain that began on 10/29/2022 in the ED CT was done that showed free air in the abdomen, was found to be shock in the perforated viscus and peritonitis, surgery was consulted.  CT also showed intrahepatic and extrahepatic duct dilation with a CBD stone and pancreatic atrophy with pancreatic duct dilation she was started empirically on cefepime and Flagyl.  Now on augmentin.  Clinically stable to be discharged to skilled nursing facility   Assessment/Plan:   Septic shock secondary to peritonitis due to perforated peptic ulcer in the setting of BC powder use: Sepsis now resolved. Off pressors, continue cefepime and Flagyl per surgery. GI has been consulted recommended Gastrografin that showed no contrast extravasation. Pt eval the patient, will need SNF placement. Further management per surgery.  She relates her pain is not controlled with narcotics. We will not increase IV narcotics need to get her on an oral regimen for when she goes to skilled.  Acute perforated peptic ulcer disease peritonitis [facility]) Patient had been using Goody powder due to rheumatoid arthritis. General surgery felt microperforation has sealed. Now complaining of abdominal pain. Conservative management for now, will change her to oral Augmentin.  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 Likely chronic than acute elevation of LFTs. Will notify GI of the results.  Pancreatic ductal dilation: Her CBC was done that showed similar appearing pancreatic duct in 2017 unchanged from previous.  Acute on chronic blood loss anemia No signs of overt bleeding. Continue PPI twice a day and Carafate. Hemoglobin has remained stable .  History of seizure disorder: Unprovoked change Keppra to oral.  Sinus bradycardia: Has resolved.  Opiate dependence: Continue current regimen pain seems to be controlled.  Anxiety: Continue Xanax 3 times a day seems to be stable.  Hypokalemia: Replete improved.  Sacral cubitus ulcer stage II present on admission RN Pressure Injury Documentation: Pressure Injury 10/31/22 Sacrum Medial Stage 2 -  Partial thickness loss of dermis presenting as a shallow open injury with a red, pink wound bed without slough. (Active)  10/31/22 2322  Location: Sacrum  Location Orientation: Medial  Staging: Stage 2 -  Partial thickness loss of dermis presenting as a shallow open injury with a red, pink wound bed without slough.  Wound Description (Comments):   Present on Admission: Yes  Dressing Type Foam - Lift dressing to assess site every shift 11/11/22 0800    DVT prophylaxis: lovenox Family Communication:non4 Status is: Inpatient Remains inpatient appropriate because: Acute perforated peptic ulcer disease with peritonitis    Code Status:     Code Status Orders  (From admission, onward)           Start     Ordered   10/31/22 1734  Full code  Continuous        10/31/22 1747  Code Status History     Date Active Date Inactive Code Status Order ID Comments User Context   09/29/2022 0431 10/04/2022 2211 Full Code [id number]  Faythe Ghee ED   07/27/2022 2304 07/30/2022 2000 Full Code [id number]  Lilyan Gilford, DO Inpatient   07/18/2022 2114  07/23/2022 0012 Full Code [id number]  Shon Hale, MD ED   09/21/2016 0441 09/21/2016 1627 Full Code [id number]  Devoria Albe, MD ED   05/08/2016 2307 05/10/2016 1521 Full Code [id number]  Briscoe Deutscher, MD ED         IV Access:   Peripheral IV   Procedures and diagnostic studies:   CT ABDOMEN PELVIS W CONTRAST  Result Date: 11/09/2022 CLINICAL DATA:  Epigastric pain EXAM: CT ABDOMEN AND PELVIS WITH CONTRAST TECHNIQUE: Multidetector CT imaging of the abdomen and pelvis was performed using the standard protocol following bolus administration of intravenous contrast. RADIATION DOSE REDUCTION: This exam was performed according to the departmental dose-optimization program which includes automated exposure control, adjustment of the mA and/or kV according to patient size and/or use of iterative reconstruction technique. CONTRAST:  65mL OMNIPAQUE IOHEXOL 350 MG/ML SOLN COMPARISON:  Previous study done on 11/05/2022 FINDINGS: Lower chest: Small bilateral pleural effusions are seen, more so on the right side. There are infiltrates in both lower lung fields suggesting atelectasis/pneumonia. Hepatobiliary: There is slight prominence of intrahepatic bile ducts. Common bile duct measures 9 mm in diameter. In image 28 of series 4, there is 5 mm radiopacity in the lumen of distal common bile duct close to the ampulla. Gallbladder is not distended. There is no wall thickening in gallbladder. Pancreas: There is prominence of pancreatic duct. No new focal abnormalities are seen. Spleen: Spleen measures 13 cm in maximum diameter. Adrenals/Urinary Tract: Adrenals are unremarkable. There is no hydronephrosis. There are no renal or ureteral stones. There is 11 mm smooth marginated low-density structure in the upper pole of left kidney suggesting possible renal cyst. No follow-up imaging is recommended. Urinary bladder is unremarkable. Stomach/Bowel: Stomach is not distended. Wall thickening seen in the antrum of the  stomach in the previous examination is not evident in the current study. There is no significant small bowel dilation. Oral contrast has reached the rectum. Appendix is not distinctly seen. There is no pericecal inflammation. There is no significant wall thickening in colon. Vascular/Lymphatic: Scattered arterial calcifications are seen. Reproductive: Uterus appears smaller than usual. There are no adnexal masses. Other: There is almost complete interval clearing of ascites. There is trace amount of fluid in perihepatic region. There is minimal stranding in the fat planes posterior to the rectum with interval decrease. This may be residual part of anasarca. There is interval increase in amount of subcutaneous fluid in abdominal wall suggesting anasarca. Musculoskeletal: There is laminectomy and fusion at L5-S1 level. Schmorl's node is seen in the upper endplate of body of T12 vertebra. There is first-degree spondylolisthesis at L4-L5 level. IMPRESSION: There is dilation of bile ducts. There is 5 mm radiopacity in the lumen of distal common bile duct close to the ampulla. Findings suggest possible choledocholithiasis. There are no signs of acute cholecystitis. Follow-up MRCP as clinically warranted may be considered. Asymmetric wall thickening in the antrum of the stomach seen in the previous study is not evident in the current study. There is almost complete interval clearing of ascites. There is no evidence of intestinal obstruction or pneumoperitoneum. There is no hydronephrosis. Small bilateral pleural effusions. There are linear infiltrates in the lower lung  fields suggesting atelectasis. There is interval increase in amount of subcutaneous edema suggesting anasarca. Enlarged spleen.  Small left renal cyst.  Lumbar spondylosis. Other findings as described in the body of the report. Electronically Signed   By: Ernie Avena M.D.   On: 11/09/2022 16:52     Medical Consultants:   None.   Subjective:     LELAH RENNAKER relates her abdominal pain is improved.  Objective:    Vitals:   11/10/22 0935 11/10/22 1633 11/11/22 0403 11/11/22 0744  BP: (!) 104/54 (!) 107/54 (!) 102/47 (!) 114/49  Pulse: 68 79 68 74  Resp: 16 18  17   Temp: 98 F (36.7 C) 97.8 F (36.6 C) (!) 97.3 F (36.3 C) 99.5 F (37.5 C)  TempSrc: Oral Oral Oral Oral  SpO2: 95% 95% 95% 100%  Weight:      Height:       SpO2: 100 % O2 Flow Rate (L/min): 2 L/min   Intake/Output Summary (Last 24 hours) at 11/11/2022 0956 Last data filed at 11/11/2022 0325 Gross per 24 hour  Intake 5 ml  Output 300 ml  Net -295 ml    Filed Weights   10/31/22 1738 10/31/22 2200  Weight: 59.9 kg 57.5 kg    Exam: General exam: In no acute distress. Respiratory system: Good air movement and clear to auscultation. Cardiovascular system: S1 & S2 heard, RRR. No JVD. Gastrointestinal system: Abdomen is nondistended, soft and nontender.  Extremities: No pedal edema. Skin: No rashes, lesions or ulcers Psychiatry: Judgement and insight appear normal. Mood & affect appropriate.   Data Reviewed:    Labs: Basic Metabolic Panel: Recent Labs  Lab 11/06/22 0400 11/07/22 0505 11/08/22 0241 11/09/22 0500 11/10/22 1200  NA 138 139 137 139 138  K 3.2* 3.1* 4.1 3.7 3.9  CL 101 102 104 104 105  CO2 30 31 30 30 29   GLUCOSE 92 92 102* 111* 101*  BUN <5* <5* 7* 8 7*  CREATININE 0.53 0.55 0.62 0.60 0.50  CALCIUM 8.6* 8.4* 8.4* 8.5* 8.5*    GFR Estimated Creatinine Clearance: 61.1 mL/min (by C-G formula based on SCr of 0.5 mg/dL). Liver Function Tests: Recent Labs  Lab 11/06/22 0400 11/07/22 0505 11/08/22 0241 11/09/22 0500 11/10/22 1200  AST 9* 10* 11* 11* 15  ALT 11 9 7 7 8   ALKPHOS 107 95 106 108 102  BILITOT 0.3 0.1* 0.3 0.3 0.1*  PROT 4.9* 5.0* 4.9* 4.8* 5.1*  ALBUMIN 2.3* 2.3* 2.3* 2.2* 2.3*    No results for input(s): "LIPASE", "AMYLASE" in the last 168 hours. No results for input(s): "AMMONIA" in the last  168 hours. Coagulation profile No results for input(s): "INR", "PROTIME" in the last 168 hours. COVID-19 Labs  No results for input(s): "DDIMER", "FERRITIN", "LDH", "CRP" in the last 72 hours.  Lab Results  Component Value Date   SARSCOV2NAA NEGATIVE 05/04/2022   SARSCOV2NAA NEGATIVE 05/19/2021    CBC: Recent Labs  Lab 11/05/22 1610 11/06/22 0400 11/07/22 0505 11/08/22 0241 11/09/22 0500 11/10/22 1200  WBC 7.9 7.3 8.8 7.6 7.3 6.7  NEUTROABS 4.6 4.4 5.2 4.2 4.2  --   HGB 9.4* 9.1* 9.0* 8.7* 8.9* 8.7*  HCT 30.6* 30.1* 29.4* 29.4* 28.6* 29.4*  MCV 82.0 82.2 81.2 83.8 82.2 83.5  PLT 309 284 309 288 272 265    Cardiac Enzymes: No results for input(s): "CKTOTAL", "CKMB", "CKMBINDEX", "TROPONINI" in the last 168 hours. BNP (last 3 results) No results for input(s): "PROBNP" in the last  8760 hours. CBG: No results for input(s): "GLUCAP" in the last 168 hours. D-Dimer: No results for input(s): "DDIMER" in the last 72 hours. Hgb A1c: No results for input(s): "HGBA1C" in the last 72 hours. Lipid Profile: No results for input(s): "CHOL", "HDL", "LDLCALC", "TRIG", "CHOLHDL", "LDLDIRECT" in the last 72 hours. Thyroid function studies: No results for input(s): "TSH", "T4TOTAL", "T3FREE", "THYROIDAB" in the last 72 hours.  Invalid input(s): "FREET3" Anemia work up: No results for input(s): "VITAMINB12", "FOLATE", "FERRITIN", "TIBC", "IRON", "RETICCTPCT" in the last 72 hours. Sepsis Labs: Recent Labs  Lab 11/07/22 0505 11/08/22 0241 11/09/22 0500 11/10/22 1200  WBC 8.8 7.6 7.3 6.7    Microbiology No results found for this or any previous visit (from the past 240 hour(s)).    Medications:    acetaminophen  1,000 mg Oral Q6H   Or   acetaminophen  650 mg Rectal Q6H   amLODipine  5 mg Oral Daily   amoxicillin-clavulanate  1 tablet Oral Q12H   Chlorhexidine Gluconate Cloth  6 each Topical Daily   enoxaparin (LOVENOX) injection  40 mg Subcutaneous Q24H   levETIRAcetam   500 mg Oral BID   multivitamin with minerals  1 tablet Oral Daily   pantoprazole  40 mg Oral BID   sucralfate  1 g Oral TID WC & HS   tiZANidine  4 mg Oral TID   Continuous Infusions:      LOS: 11 days   Marinda Elk  [facility]  11/11/2022, 9:56 AM

## 2022-11-12 DIAGNOSIS — K659 Peritonitis, unspecified: Secondary | ICD-10-CM | POA: Diagnosis not present

## 2022-11-12 MED ORDER — HYDROMORPHONE HCL 1 MG/ML IJ SOLN: 0.5000 mg | Freq: Once | INTRAMUSCULAR | Status: DC

## 2022-11-12 MED ORDER — FENTANYL CITRATE PF 50 MCG/ML IJ SOSY
12.5000 ug | PREFILLED_SYRINGE | Freq: Once | INTRAMUSCULAR | Status: AC
  Administered 2022-11-13: 12.5 ug via INTRAVENOUS
  Filled 2022-11-12: qty 1

## 2022-11-12 NOTE — Progress Notes (Signed)
[facility] CONSULT PROGRESS NOTE    Progress Note  Jamie [name]  WUJ:[id number] DOB: [birth year] DOA: 10/31/2022 PCP: Barbie Banner, MD     Brief Narrative:   Jamie [name] is an 66 y.o. female past medical history of anxiety alcohol abuse in remission, chronic back pain, history of seizure disorder and discharged from the hospital on October 04, 2022 she was treated for sepsis secondary to pneumonia treated with pressors hospital stay was complicated by seizures comes in again for right upper quadrant abdominal pain that began on 10/29/2022 in the ED CT was done that showed free air in the abdomen, was found to be shock in the perforated viscus and peritonitis, surgery was consulted.  CT also showed intrahepatic and extrahepatic duct dilation with a CBD stone and pancreatic atrophy with pancreatic duct dilation she was started empirically on cefepime and Flagyl.  Now on augmentin.  Clinically stable to be discharged to skilled nursing facility   Assessment/Plan:   Septic shock secondary to peritonitis due to perforated peptic ulcer in the setting of BC powder use: Sepsis now resolved. Off pressors, continue cefepime and Flagyl per surgery. GI has been consulted recommended Gastrografin that showed no contrast extravasation. Pt eval the patient, will need SNF placement. Further management per surgery.  She relates her pain is not controlled with narcotics. We will not increase IV narcotics. Clinically stable to be transferred to skilled nursing facility.  Acute perforated peptic ulcer disease peritonitis [facility]) Patient had been using Goody powder due to rheumatoid arthritis. General surgery felt microperforation has sealed. Now complaining of abdominal pain. Conservative management for now, will change her to oral Augmentin.  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 Likely chronic than acute elevation of LFTs. Will notify GI of the results.  Pancreatic ductal dilation: Her CBC was done that showed similar appearing pancreatic duct in 2017 unchanged from previous.  Acute on chronic blood loss anemia No signs of overt bleeding. Continue PPI twice a day and Carafate. Hemoglobin has remained stable .  History of seizure disorder: Unprovoked change Keppra to oral.  Sinus bradycardia: Has resolved.  Opiate dependence: Continue current regimen pain seems to be controlled.  Anxiety: Continue Xanax 3 times a day seems to be stable.  Hypokalemia: Replete improved.  Sacral cubitus ulcer stage II present on admission RN Pressure Injury Documentation: Pressure Injury 10/31/22 Sacrum Medial Stage 2 -  Partial thickness loss of dermis presenting as a shallow open injury with a red, pink wound bed without slough. (Active)  10/31/22 2322  Location: Sacrum  Location Orientation: Medial  Staging: Stage 2 -  Partial thickness loss of dermis presenting as a shallow open injury with a red, pink wound bed without slough.  Wound Description (Comments):   Present on Admission: Yes  Dressing Type Foam - Lift dressing to assess site every shift 11/11/22 1949    DVT prophylaxis: lovenox Family Communication:non4 Status is: Inpatient Remains inpatient appropriate because: Acute perforated peptic ulcer disease with peritonitis    Code Status:     Code Status Orders  (From admission, onward)           Start     Ordered   10/31/22 1734  Full code  Continuous        10/31/22 1747           Code Status  History     Date Active Date Inactive Code Status Order ID Comments User Context   09/29/2022 0431 10/04/2022 2211 Full Code [id number]  Rhae Lerner ED   07/27/2022 2304 07/30/2022 2000 Full Code [id number]  Rolla Plate, DO Inpatient   07/18/2022 2114  07/23/2022 0012 Full Code [id number]  Roxan Hockey, MD ED   09/21/2016 0441 09/21/2016 1627 Full Code [id number]  Rolland Porter, MD ED   05/08/2016 2307 05/10/2016 1521 Full Code [id number]  Opyd, Ilene Qua, MD ED         IV Access:   Peripheral IV   Procedures and diagnostic studies:   No results found.   Medical Consultants:   None.   Subjective:    Jamie [name] relates her abdominal pain is improved.  Objective:    Vitals:   11/11/22 0744 11/11/22 1557 11/11/22 2050 11/12/22 0533  BP: (!) 114/49 (!) 108/47 (!) 104/46 (!) 110/52  Pulse: 74 84 75 80  Resp: 17 17 17 18   Temp: 99.5 F (37.5 C) 98.1 F (36.7 C) 98.6 F (37 C) 98.5 F (36.9 C)  TempSrc: Oral Oral Oral Oral  SpO2: 100% 97% 95% 98%  Weight:      Height:       SpO2: 98 % O2 Flow Rate (L/min): 2 L/min   Intake/Output Summary (Last 24 hours) at 11/12/2022 0824 Last data filed at 11/11/2022 2141 Gross per 24 hour  Intake 240 ml  Output 1100 ml  Net -860 ml    Filed Weights   10/31/22 1738 10/31/22 2200  Weight: 59.9 kg 57.5 kg    Exam: General exam: In no acute distress. Respiratory system: Good air movement and clear to auscultation. Cardiovascular system: S1 & S2 heard, RRR. No JVD. Gastrointestinal system: Abdomen is nondistended, soft and nontender.  Extremities: No pedal edema. Skin: No rashes, lesions or ulcers Psychiatry: Judgement and insight appear normal. Mood & affect appropriate.   Data Reviewed:    Labs: Basic Metabolic Panel: Recent Labs  Lab 11/06/22 0400 11/07/22 0505 11/08/22 0241 11/09/22 0500 11/10/22 1200  NA 138 139 137 139 138  K 3.2* 3.1* 4.1 3.7 3.9  CL 101 102 104 104 105  CO2 30 31 30 30 29   GLUCOSE 92 92 102* 111* 101*  BUN <5* <5* 7* 8 7*  CREATININE 0.53 0.55 0.62 0.60 0.50  CALCIUM 8.6* 8.4* 8.4* 8.5* 8.5*    GFR Estimated Creatinine Clearance: 61.1 mL/min (by C-G formula based on SCr of 0.5 mg/dL). Liver Function Tests: Recent Labs  Lab  11/06/22 0400 11/07/22 0505 11/08/22 0241 11/09/22 0500 11/10/22 1200  AST 9* 10* 11* 11* 15  ALT 11 9 7 7 8   ALKPHOS 107 95 106 108 102  BILITOT 0.3 0.1* 0.3 0.3 0.1*  PROT 4.9* 5.0* 4.9* 4.8* 5.1*  ALBUMIN 2.3* 2.3* 2.3* 2.2* 2.3*    No results for input(s): "LIPASE", "AMYLASE" in the last 168 hours. No results for input(s): "AMMONIA" in the last 168 hours. Coagulation profile No results for input(s): "INR", "PROTIME" in the last 168 hours. COVID-19 Labs  No results for input(s): "DDIMER", "FERRITIN", "LDH", "CRP" in the last 72 hours.  Lab Results  Component Value Date   SARSCOV2NAA NEGATIVE 05/04/2022   [place] NEGATIVE 05/19/2021    CBC: Recent Labs  Lab 11/05/22 1610 11/06/22 0400 11/07/22 0505 11/08/22 0241 11/09/22 0500 11/10/22 1200  WBC 7.9 7.3 8.8 7.6 7.3 6.7  NEUTROABS 4.6 4.4 5.2 4.2 4.2  --  HGB 9.4* 9.1* 9.0* 8.7* 8.9* 8.7*  HCT 30.6* 30.1* 29.4* 29.4* 28.6* 29.4*  MCV 82.0 82.2 81.2 83.8 82.2 83.5  PLT 309 284 309 288 272 265    Cardiac Enzymes: No results for input(s): "CKTOTAL", "CKMB", "CKMBINDEX", "TROPONINI" in the last 168 hours. BNP (last 3 results) No results for input(s): "PROBNP" in the last 8760 hours. CBG: No results for input(s): "GLUCAP" in the last 168 hours. D-Dimer: No results for input(s): "DDIMER" in the last 72 hours. Hgb A1c: No results for input(s): "HGBA1C" in the last 72 hours. Lipid Profile: No results for input(s): "CHOL", "HDL", "LDLCALC", "TRIG", "CHOLHDL", "LDLDIRECT" in the last 72 hours. Thyroid function studies: No results for input(s): "TSH", "T4TOTAL", "T3FREE", "THYROIDAB" in the last 72 hours.  Invalid input(s): "FREET3" Anemia work up: No results for input(s): "VITAMINB12", "FOLATE", "FERRITIN", "TIBC", "IRON", "RETICCTPCT" in the last 72 hours. Sepsis Labs: Recent Labs  Lab 11/07/22 0505 11/08/22 0241 11/09/22 0500 11/10/22 1200  WBC 8.8 7.6 7.3 6.7    Microbiology No results found for  this or any previous visit (from the past 240 hour(s)).    Medications:    acetaminophen  1,000 mg Oral Q6H   Or   acetaminophen  650 mg Rectal Q6H   amLODipine  5 mg Oral Daily   amoxicillin-clavulanate  1 tablet Oral Q12H   Chlorhexidine Gluconate Cloth  6 each Topical Daily   enoxaparin (LOVENOX) injection  40 mg Subcutaneous Q24H   levETIRAcetam  500 mg Oral BID   multivitamin with minerals  1 tablet Oral Daily   pantoprazole  40 mg Oral BID   sucralfate  1 g Oral TID WC & HS   tiZANidine  4 mg Oral TID   Continuous Infusions:      LOS: 12 days   Charlynne Cousins  [facility]  11/12/2022, 8:24 AM

## 2022-11-12 NOTE — TOC Progression Note (Signed)
Transition of Care [facility]) - Progression Note    Patient Details  Name: Jamie [name] MRN: [id number] Date of Birth: 12-16-1956  Transition of [facility]) CM/SW Contact  Delilah Shan, LCSWA Phone Number: 11/12/2022, 4:08 PM  Clinical Narrative:     Patient has SNF bed at [facility]. Insurance authorization currently pending. CSW will continue to follow and assist with patients dc planning needs.   Expected Discharge Plan: Home w Home Health Services Barriers to Discharge: Continued Medical Work up  Expected Discharge Plan and Services Expected Discharge Plan: Home w Home Health Services In-house Referral: Clinical Social Work     Living arrangements for the past 2 months: Single Family Home                                       Social Determinants of Health (SDOH) Interventions    Readmission Risk Interventions    11/01/2022   12:39 PM 10/04/2022    3:42 PM 07/28/2022    1:55 PM  Readmission Risk Prevention Plan  Transportation Screening Complete Complete Complete  Medication Review Oceanographer) Complete Complete Complete  PCP or Specialist appointment within 3-5 days of discharge   Not Complete  HRI or Home Care Consult  Complete Complete  SW Recovery Care/Counseling Consult Complete Complete Complete  Palliative Care Screening Not Applicable Not Applicable Not Applicable  Skilled Nursing Facility  Not Applicable Not Applicable

## 2022-11-13 ENCOUNTER — Inpatient Hospital Stay (HOSPITAL_COMMUNITY): Payer: Medicare HMO

## 2022-11-13 DIAGNOSIS — K251 Acute gastric ulcer with perforation: Secondary | ICD-10-CM | POA: Diagnosis not present

## 2022-11-13 DIAGNOSIS — K8050 Calculus of bile duct without cholangitis or cholecystitis without obstruction: Secondary | ICD-10-CM | POA: Diagnosis not present

## 2022-11-13 DIAGNOSIS — K659 Peritonitis, unspecified: Secondary | ICD-10-CM | POA: Diagnosis not present

## 2022-11-13 MED ORDER — HYDROMORPHONE HCL 1 MG/ML IJ SOLN: 0.2500 mg | Freq: Four times a day (QID) | INTRAMUSCULAR | Status: DC | PRN

## 2022-11-13 MED ORDER — HYDROMORPHONE HCL 1 MG/ML IJ SOLN
1.0000 mg | INTRAMUSCULAR | Status: AC | PRN
  Administered 2022-11-13 (×3): 1 mg via INTRAVENOUS
  Filled 2022-11-13 (×3): qty 1

## 2022-11-13 NOTE — Plan of Care (Signed)

## 2022-11-13 NOTE — Progress Notes (Signed)
       CROSS COVER NOTE  NAME: Jamie [name] MRN: [id number] DOB : Nov 25, 1956    Date of Service   11/13/2022   HPI/Events of Note   Notified by bedside RN of unwitnessed fall.  Per nursing staff, patient was attempting to get out of bed to ask for pain medication but fell in the process of doing so.  Patient was found lying on her left side.  She did require nursing assistance back to bed.  Although the patient denies hitting her head, she does complain of back pain and generalized pain, 10/10.  Per bedside RN, patient is alert and oriented x4.  She is able to move all extremities without facial grimacing.  No changes in strength and motor dexterity in all 4 extremities.  No reports of bleeding or bruising.  Additional pain medication given at this time. CT imaging pending.  Interventions/ Plan   Imaging       Anthoney Harada, DNP, [facility]- [facility]

## 2022-11-13 NOTE — Progress Notes (Signed)
Patient called out for help; staff that was nearby enter room [phone] found patient in a lying position in the middle of the room, on her left side facing the door. She stated, " I was trying to get up to go to the front desk and ask for pain medication" She is alert and oriented x3, able to move all extremities without difficulty, staff assist patient back to bed and provided care, bed at lowest position and bed alarm activated.  Patient educated to call before getting up and to wait for staff. Patient medicated for pain to lower back on a pain scale of 10/10,MD notified and new orders placed.

## 2022-11-13 NOTE — Progress Notes (Signed)
[facility] CONSULT PROGRESS NOTE    Progress Note  Jamie [name]  [id number] DOB: 25-May-1956 DOA: 10/31/2022 PCP: Christain Sacramento, MD     Brief Narrative:   Jamie [name] is an 66 y.o. female past medical history of anxiety alcohol abuse in remission, chronic back pain, history of seizure disorder and discharged from the hospital on October 04, 2022 she was treated for sepsis secondary to pneumonia treated with pressors hospital stay was complicated by seizures comes in again for right upper quadrant abdominal pain that began on 10/29/2022 in the ED CT was done that showed free air in the abdomen, was found to be shock in the perforated viscus and peritonitis, surgery was consulted.  CT also showed intrahepatic and extrahepatic duct dilation with a CBD stone and pancreatic atrophy with pancreatic duct dilation she was started empirically on cefepime and Flagyl.  Now on augmentin.  Clinically stable to be discharged to skilled nursing facility   Assessment/Plan:   Septic shock secondary to peritonitis due to perforated peptic ulcer in the setting of BC powder use: Sepsis now resolved. Off pressors, continue cefepime and Flagyl per surgery. We will not increase IV narcotics. Clinically stable to be transferred to skilled nursing facility.  Acute perforated peptic ulcer disease peritonitis ([place]) Conservative management for now, continue oral Augmentin.  Choledocholithiasis biliary extraperitoneal hepatic ductal dilation: MRCP showed ductal dilation to small stone measuring 4 mm with mildly dilated gallbladder and pericholecystic fluid to go to go for acute cholecystitis. GI will follow-up as an outpatient.  Pancreatic ductal dilation: Her CT was done that showed similar appearing pancreatic duct in 2017 unchanged from previous.  Acute on chronic blood loss anemia No signs of overt bleeding. Continue PPI twice a day and Carafate. Hemoglobin has remained stable .  History of  seizure disorder: Unprovoked change Keppra to oral.  Sinus bradycardia: Has resolved.  Opiate dependence: Continue current regimen pain seems to be controlled.  Anxiety: Continue Xanax 3 times a day seems to be stable.  Hypokalemia: Replete improved.  Low back pain after fall: CT scan of lumbar spine and thoracic spine have been ordered.  Sacral cubitus ulcer stage II present on admission RN Pressure Injury Documentation: Pressure Injury 10/31/22 Sacrum Medial Stage 2 -  Partial thickness loss of dermis presenting as a shallow open injury with a red, pink wound bed without slough. (Active)  10/31/22 2322  Location: Sacrum  Location Orientation: Medial  Staging: Stage 2 -  Partial thickness loss of dermis presenting as a shallow open injury with a red, pink wound bed without slough.  Wound Description (Comments):   Present on Admission: Yes  Dressing Type Foam - Lift dressing to assess site every shift 11/12/22 2213    DVT prophylaxis: lovenox Family Communication:non4 Status is: Inpatient Remains inpatient appropriate because: Acute perforated peptic ulcer disease with peritonitis    Code Status:     Code Status Orders  (From admission, onward)           Start     Ordered   10/31/22 1734  Full code  Continuous        10/31/22 1747           Code Status History     Date Active Date Inactive Code Status Order ID Comments User Context   09/29/2022 0431 10/04/2022 2211 Full Code [id number]  Rhae Lerner ED   07/27/2022 2304 07/30/2022 2000 Full Code [id number]  Rolla Plate, DO Inpatient  07/18/2022 2114 07/23/2022 0012 Full Code [id number]  Shon Hale, MD ED   09/21/2016 0441 09/21/2016 1627 Full Code [id number]  Devoria Albe, MD ED   05/08/2016 2307 05/10/2016 1521 Full Code [id number]  Opyd, Lavone Neri, MD ED         IV Access:   Peripheral IV   Procedures and diagnostic studies:   No results found.   Medical Consultants:    None.   Subjective:    Jamie [name] relates back pain is worse after the fall.  Objective:    Vitals:   11/12/22 2206 11/13/22 0030 11/13/22 0430 11/13/22 0845  BP: (!) 105/43 (!) 108/59 (!) 109/52 111/77  Pulse: 80 69 70 74  Resp: 20 18 18 16   Temp: 98.2 F (36.8 C) 97.9 F (36.6 C) 98.2 F (36.8 C) 98.5 F (36.9 C)  TempSrc: Oral Oral Oral Oral  SpO2: 98%   100%  Weight:      Height:       SpO2: 100 % O2 Flow Rate (L/min): 2 L/min   Intake/Output Summary (Last 24 hours) at 11/13/2022 0849 Last data filed at 11/12/2022 1750 Gross per 24 hour  Intake 600 ml  Output 700 ml  Net -100 ml    Filed Weights   10/31/22 1738 10/31/22 2200  Weight: 59.9 kg 57.5 kg    Exam: General exam: In no acute distress. Respiratory system: Good air movement and clear to auscultation. Cardiovascular system: S1 & S2 heard, RRR. No JVD. Gastrointestinal system: Abdomen is nondistended, soft and nontender.  Extremities: No pedal edema. Skin: No rashes, lesions or ulcers Psychiatry: Judgement and insight appear normal. Mood & affect appropriate.   Data Reviewed:    Labs: Basic Metabolic Panel: Recent Labs  Lab 11/07/22 0505 11/08/22 0241 11/09/22 0500 11/10/22 1200  NA 139 137 139 138  K 3.1* 4.1 3.7 3.9  CL 102 104 104 105  CO2 31 30 30 29   GLUCOSE 92 102* 111* 101*  BUN <5* 7* 8 7*  CREATININE 0.55 0.62 0.60 0.50  CALCIUM 8.4* 8.4* 8.5* 8.5*    GFR Estimated Creatinine Clearance: 61.1 mL/min (by C-G formula based on SCr of 0.5 mg/dL). Liver Function Tests: Recent Labs  Lab 11/07/22 0505 11/08/22 0241 11/09/22 0500 11/10/22 1200  AST 10* 11* 11* 15  ALT 9 7 7 8   ALKPHOS 95 106 108 102  BILITOT 0.1* 0.3 0.3 0.1*  PROT 5.0* 4.9* 4.8* 5.1*  ALBUMIN 2.3* 2.3* 2.2* 2.3*    No results for input(s): "LIPASE", "AMYLASE" in the last 168 hours. No results for input(s): "AMMONIA" in the last 168 hours. Coagulation profile No results for input(s): "INR",  "PROTIME" in the last 168 hours. COVID-19 Labs  No results for input(s): "DDIMER", "FERRITIN", "LDH", "CRP" in the last 72 hours.  Lab Results  Component Value Date   SARSCOV2NAA NEGATIVE 05/04/2022   SARSCOV2NAA NEGATIVE 05/19/2021    CBC: Recent Labs  Lab 11/07/22 0505 11/08/22 0241 11/09/22 0500 11/10/22 1200  WBC 8.8 7.6 7.3 6.7  NEUTROABS 5.2 4.2 4.2  --   HGB 9.0* 8.7* 8.9* 8.7*  HCT 29.4* 29.4* 28.6* 29.4*  MCV 81.2 83.8 82.2 83.5  PLT 309 288 272 265    Cardiac Enzymes: No results for input(s): "CKTOTAL", "CKMB", "CKMBINDEX", "TROPONINI" in the last 168 hours. BNP (last 3 results) No results for input(s): "PROBNP" in the last 8760 hours. CBG: No results for input(s): "GLUCAP" in the last 168 hours. D-Dimer: No results for input(s): "  DDIMER" in the last 72 hours. Hgb A1c: No results for input(s): "HGBA1C" in the last 72 hours. Lipid Profile: No results for input(s): "CHOL", "HDL", "LDLCALC", "TRIG", "CHOLHDL", "LDLDIRECT" in the last 72 hours. Thyroid function studies: No results for input(s): "TSH", "T4TOTAL", "T3FREE", "THYROIDAB" in the last 72 hours.  Invalid input(s): "FREET3" Anemia work up: No results for input(s): "VITAMINB12", "FOLATE", "FERRITIN", "TIBC", "IRON", "RETICCTPCT" in the last 72 hours. Sepsis Labs: Recent Labs  Lab 11/07/22 0505 11/08/22 0241 11/09/22 0500 11/10/22 1200  WBC 8.8 7.6 7.3 6.7    Microbiology No results found for this or any previous visit (from the past 240 hour(s)).    Medications:    acetaminophen  1,000 mg Oral Q6H   Or   acetaminophen  650 mg Rectal Q6H   amLODipine  5 mg Oral Daily   amoxicillin-clavulanate  1 tablet Oral Q12H   Chlorhexidine Gluconate Cloth  6 each Topical Daily   enoxaparin (LOVENOX) injection  40 mg Subcutaneous Q24H   levETIRAcetam  500 mg Oral BID   multivitamin with minerals  1 tablet Oral Daily   pantoprazole  40 mg Oral BID   sucralfate  1 g Oral TID WC & HS   tiZANidine   4 mg Oral TID   Continuous Infusions:      LOS: 13 days   Charlynne Cousins  [facility]  11/13/2022, 8:49 AM

## 2022-11-14 DIAGNOSIS — K659 Peritonitis, unspecified: Secondary | ICD-10-CM | POA: Diagnosis not present

## 2022-11-14 DIAGNOSIS — K8050 Calculus of bile duct without cholangitis or cholecystitis without obstruction: Secondary | ICD-10-CM | POA: Diagnosis not present

## 2022-11-14 DIAGNOSIS — K251 Acute gastric ulcer with perforation: Secondary | ICD-10-CM | POA: Diagnosis not present

## 2022-11-14 MED ORDER — OXYCODONE HCL 5 MG PO TABS
20.0000 mg | ORAL_TABLET | ORAL | Status: DC | PRN
  Administered 2022-11-14 – 2022-11-20 (×40): 20 mg via ORAL
  Filled 2022-11-14 (×41): qty 4

## 2022-11-14 NOTE — TOC Progression Note (Signed)
Transition of Care [facility]) - Progression Note    Patient Details  Name: Jamie [name] MRN: [id number] Date of Birth: [birth year]  [facility] CM/SW Contact  Delilah Shan, LCSWA Phone Number: 11/14/2022, 10:44 AM  Clinical Narrative:     CSW spoke with Irving Burton at [facility] who confirmed that patients insurance authorization is still pending. CSW informed MD. CSW will continue to follow and assist with patients dc planning needs.  Expected Discharge Plan: Home w Home Health Services Barriers to Discharge: Continued Medical Work up  Expected Discharge Plan and Services Expected Discharge Plan: Home w Home Health Services In-house Referral: Clinical Social Work     Living arrangements for the past 2 months: Single Family Home                                       Social Determinants of Health (SDOH) Interventions    Readmission Risk Interventions    11/01/2022   12:39 PM 10/04/2022    3:42 PM 07/28/2022    1:55 PM  Readmission Risk Prevention Plan  Transportation Screening Complete Complete Complete  Medication Review Oceanographer) Complete Complete Complete  PCP or Specialist appointment within 3-5 days of discharge   Not Complete  HRI or Home Care Consult  Complete Complete  SW Recovery Care/Counseling Consult Complete Complete Complete  Palliative Care Screening Not Applicable Not Applicable Not Applicable  Skilled Nursing Facility  Not Applicable Not Applicable

## 2022-11-14 NOTE — Care Management Important Message (Signed)
Important Message  Patient Details  Name: Jamie [name] MRN: [id number] Date of Birth: 1956/05/19   Medicare Important Message Given:  Yes     Sherilyn Banker 11/14/2022, 3:14 PM

## 2022-11-14 NOTE — Progress Notes (Signed)
Physical Therapy Treatment Patient Details Name: Jamie [name] MRN: [id number] DOB: [birth year] Today's Date: 11/14/2022   History of Present Illness Pt is a 66 y/o female who presents 10/31/2022 from home where she was found by EMS on her couch, covered in urine and feces, unable to get up. Pt called EMS due to SOB and abdominal pain. She was found to have a probable gastric ulcer. PMH significant for anxiety, gastroenteritis, ETOH abuse.    PT Comments    Pt received on BSC. Has back and B knee pain since falling on Saturday. Pt with maximal fwd flexion for perineal care after using BSC, discussed this and the need for assistance with this while her back is healing. Pt needs min A for transfer. After resting from [facility] transfers was able to ambulate within room with RW. Pt continued to c/o back and B knee pain but did not have knee buckling in standing thankfully. Discussed mvmt patterns to decrease back pain and pt left in supine in bed with bed alarm on. Continue to recommend SNF at d/c. PT will continue to follow.    Recommendations for follow up therapy are one component of a multi-disciplinary discharge planning process, led by the attending physician.  Recommendations may be updated based on patient status, additional functional criteria and insurance authorization.  Follow Up Recommendations  Skilled nursing-short term rehab (<3 hours/day) Can patient physically be transported by private vehicle: Yes   Assistance Recommended at Discharge Frequent or constant Supervision/Assistance  Patient can return home with the following A little help with walking and/or transfers;A little help with bathing/dressing/bathroom;Assistance with cooking/housework;Assist for transportation;Help with stairs or ramp for entrance   Equipment Recommendations  Other (comment) (TBD by next venue of care)    Recommendations for Other Services       Precautions / Restrictions Precautions Precautions:  Fall Precaution Comments: Premedicate for pain; frequent stools Restrictions Weight Bearing Restrictions: No     Mobility  Bed Mobility Overal bed mobility: Needs Assistance Bed Mobility: Supine to Sit, Sit to Supine     Supine to sit: Supervision Sit to supine: Supervision   General bed mobility comments: vc's for log rolling for comfort with back pain though pt tends to sit up straight in bed. Supervision for safety and min A for positioning once in bed    Transfers Overall transfer level: Needs assistance Equipment used: Rolling walker (2 wheels) Transfers: Sit to/from Stand, Bed to chair/wheelchair/BSC Sit to Stand: Min assist Stand pivot transfers: Min assist         General transfer comment: min A to steady. Pt maintains flexed posture in standing, worked on trying to get up straighter for mgmt of back pain    Ambulation/Gait Ambulation/Gait assistance: Editor, commissioning (Feet): 25 Feet Assistive device: Rolling walker (2 wheels) Gait Pattern/deviations: Step-through pattern, Decreased stride length, Trunk flexed Gait velocity: decreased Gait velocity interpretation: <1.31 ft/sec, indicative of household ambulator   General Gait Details: able to ambulate around room, could not tolerate more than 25' due to pain   Stairs             Wheelchair Mobility    Modified Rankin (Stroke Patients Only)       Balance Overall balance assessment: Needs assistance Sitting-balance support: Feet supported, No upper extremity supported Sitting balance-Leahy Scale: Fair     Standing balance support: During functional activity, Reliant on assistive device for balance, Bilateral upper extremity supported Standing balance-Leahy Scale: Poor Standing balance comment: needs UE  support                            Cognition Arousal/Alertness: Awake/alert Behavior During Therapy: WFL for tasks assessed/performed, Anxious Overall Cognitive Status:  Within Functional Limits for tasks assessed                                 General Comments: pt appropriate through session        Exercises Other Exercises Other Exercises: voluntary posterior chain contraction and then relaxation in supine Other Exercises: abdominal activation in standing    General Comments General comments (skin integrity, edema, etc.): assisted pt in bowel cleanup from [facility], pt leans fwd with more than 90 deg hip flex to clean self, discussed this in relation to her back healing.      Pertinent Vitals/Pain Pain Assessment Pain Assessment: Faces Faces Pain Scale: Hurts whole lot Pain Location: back and knees Pain Descriptors / Indicators: Grimacing, Guarding, Moaning Pain Intervention(s): Limited activity within patient's tolerance, Monitored during session    Home Living                          Prior Function            PT Goals (current goals can now be found in the care plan section) Acute Rehab PT Goals Patient Stated Goal: Decrease pain PT Goal Formulation: With patient Time For Goal Achievement: 11/22/22 Potential to Achieve Goals: Good Progress towards PT goals: Progressing toward goals    Frequency    Min 2X/week      PT Plan Frequency needs to be updated    Co-evaluation              AM-PAC PT "6 Clicks" Mobility   Outcome Measure  Help needed turning from your back to your side while in a flat bed without using bedrails?: None Help needed moving from lying on your back to sitting on the side of a flat bed without using bedrails?: A Little Help needed moving to and from a bed to a chair (including a wheelchair)?: A Little Help needed standing up from a chair using your arms (e.g., wheelchair or bedside chair)?: A Little Help needed to walk in hospital room?: A Little Help needed climbing 3-5 steps with a railing? : A Lot 6 Click Score: 18    End of Session Equipment Utilized During Treatment:  Gait belt Activity Tolerance: Patient tolerated treatment well Patient left: in bed;with call bell/phone within reach;with bed alarm set Nurse Communication: Mobility status PT Visit Diagnosis: Unsteadiness on feet (R26.81);Pain;Difficulty in walking, not elsewhere classified (R26.2) Pain - Right/Left:  (B) Pain - part of body: Knee (back)     Time: 3762-8315 PT Time Calculation (min) (ACUTE ONLY): 27 min  Charges:  $Gait Training: 8-22 mins $Therapeutic Activity: 8-22 mins                     Lyanne Co, PT  Acute Rehab Services Secure chat preferred Office [phone]    Lawana Chambers Yanci Bachtell 11/14/2022, 2:20 PM

## 2022-11-14 NOTE — Progress Notes (Signed)
Mobility Specialist Progress Note   11/14/22 0930  Mobility  Activity Transferred from bed to chair  Level of Assistance Contact guard assist, steadying assist  Assistive Device Front wheel walker  Distance Ambulated (ft) 3 ft  Range of Motion/Exercises Active;All extremities  Activity Response Tolerated well   Patient received in supine reluctant to participate stating she can't walk. Could not state a reason why she "couldn't" or wouldn't walk, only mentioning pain in back and discomfort in abdomen. Eventually agreed with max encouragement to eat breakfast in recliner chair. Was independent to EOB from supine to sit and stood with minimal assistance. Was able to take steps to recliner chair at min guard. Tolerated without complaint or incident. Was left in recliner with all needs met, call bell in reach.   Jamie [name], BS EXP Mobility Specialist Please contact via SecureChat or Rehab office at [phone]

## 2022-11-14 NOTE — Progress Notes (Signed)
[facility] CONSULT PROGRESS NOTE    Progress Note  ALEIGHYA FENERTY  AXE:[id number] DOB: 09-12-1956 DOA: 10/31/2022 PCP: Barbie Banner, MD     Brief Narrative:   ENZLEY BURKET is an 66 y.o. female past medical history of anxiety alcohol abuse in remission, chronic back pain, history of seizure disorder and discharged from the hospital on October 04, 2022 she was treated for sepsis secondary to pneumonia treated with pressors hospital stay was complicated by seizures comes in again for right upper quadrant abdominal pain that began on 10/29/2022 in the ED CT was done that showed free air in the abdomen, was found to be shock in the perforated viscus and peritonitis, surgery was consulted.  CT also showed intrahepatic and extrahepatic duct dilation with a CBD stone and pancreatic atrophy with pancreatic duct dilation she was started empirically on cefepime and Flagyl.  Now on augmentin.  Clinically stable to be discharged to skilled nursing facility   Assessment/Plan:   Septic shock secondary to peritonitis due to perforated peptic ulcer in the setting of BC powder use: Sepsis now resolved. Off pressors, continue cefepime and Flagyl per surgery. We will not increase IV narcotics. Clinically stable to be transferred to skilled nursing facility.  Awaiting insurance of approval for discharge.  Acute perforated peptic ulcer disease peritonitis (HCC) Conservative management for now, continue oral Augmentin.  Choledocholithiasis biliary extraperitoneal hepatic ductal dilation: MRCP showed ductal dilation to small stone measuring 4 mm with mildly dilated gallbladder and pericholecystic fluid to go to go for acute cholecystitis. GI will follow-up as an outpatient.  Pancreatic ductal dilation: Her CT was done that showed similar appearing pancreatic duct in 2017 unchanged from previous.  Acute on chronic blood loss anemia No signs of overt bleeding. Continue PPI twice a day and  Carafate. Hemoglobin has remained stable .  History of seizure disorder: Unprovoked change Keppra to oral.  Sinus bradycardia: Has resolved.  Opiate dependence: Continue current regimen pain seems to be controlled.  Anxiety: Continue Xanax 3 times a day seems to be stable.  Hypokalemia: Replete improved.  Low back pain after fall: CT scan of lumbar spine and thoracic spine have been ordered.  Sacral cubitus ulcer stage II present on admission RN Pressure Injury Documentation: Pressure Injury 10/31/22 Sacrum Medial Stage 2 -  Partial thickness loss of dermis presenting as a shallow open injury with a red, pink wound bed without slough. (Active)  10/31/22 2322  Location: Sacrum  Location Orientation: Medial  Staging: Stage 2 -  Partial thickness loss of dermis presenting as a shallow open injury with a red, pink wound bed without slough.  Wound Description (Comments):   Present on Admission: Yes  Dressing Type Foam - Lift dressing to assess site every shift 11/13/22 2110    DVT prophylaxis: lovenox Family Communication:non4 Status is: Inpatient Remains inpatient appropriate because: Acute perforated peptic ulcer disease with peritonitis    Code Status:     Code Status Orders  (From admission, onward)           Start     Ordered   10/31/22 1734  Full code  Continuous        10/31/22 1747           Code Status History     Date Active Date Inactive Code Status Order ID Comments User Context   09/29/2022 0431 10/04/2022 2211 Full Code [id number]  Faythe Ghee ED   07/27/2022 2304 07/30/2022 2000 Full Code [id number]  Lilyan Gilford, DO Inpatient   07/18/2022 2114 07/23/2022 0012 Full Code [id number]  Shon Hale, MD ED   09/21/2016 0441 09/21/2016 1627 Full Code [id number]  Devoria Albe, MD ED   05/08/2016 2307 05/10/2016 1521 Full Code [id number]  Opyd, Lavone Neri, MD ED         IV Access:   Peripheral IV   Procedures and diagnostic  studies:   CT LUMBAR SPINE WO CONTRAST  Result Date: 11/13/2022 CLINICAL DATA:  Fall with increased fracture risk. EXAM: CT THORACIC AND LUMBAR SPINE WITHOUT CONTRAST TECHNIQUE: Multidetector CT imaging of the thoracic and lumbar spine was performed without contrast. Multiplanar CT image reconstructions were also generated. RADIATION DOSE REDUCTION: This exam was performed according to the departmental dose-optimization program which includes automated exposure control, adjustment of the mA and/or kV according to patient size and/or use of iterative reconstruction technique. COMPARISON:  None Available. FINDINGS: CT THORACIC SPINE FINDINGS Alignment: No traumatic malalignment. Exaggerated thoracic kyphosis. Vertebrae: Generalized osteopenia. No acute fracture or incidental bone lesion. Paraspinal and other soft tissues: Negative for perispinal mass or inflammation. Bands of atelectasis at the lung bases with small pleural effusions. Biapical emphysema. Disc levels: Generalized thoracic degenerative disc narrowing and endplate irregularity. No bony impingement. CT LUMBAR SPINE FINDINGS Segmentation: 5 lumbar type vertebrae Alignment: Mild L4-5 anterolisthesis, facet mediated Vertebrae: No acute fracture or focal pathologic process. Solid L5-S1 fusion. Paraspinal and other soft tissues: Atheromatous plaque. No perispinal inflammatory or masslike finding Disc levels: Advanced facet degeneration at L4-5 with anterolisthesis. The disc is narrowed and bulging with mild bilateral foraminal narrowing. Notable L3-4 foraminal disc bulging as well. IMPRESSION: 1. No acute fracture or traumatic malalignment 2. Generalized thoracic disc degeneration with thoracic kyphosis and L4-5 anterolisthesis. 3. L5-S1 solid fusion. Electronically Signed   By: Tiburcio Pea M.D.   On: 11/13/2022 12:44   CT THORACIC SPINE WO CONTRAST  Result Date: 11/13/2022 CLINICAL DATA:  Fall with increased fracture risk. EXAM: CT THORACIC AND  LUMBAR SPINE WITHOUT CONTRAST TECHNIQUE: Multidetector CT imaging of the thoracic and lumbar spine was performed without contrast. Multiplanar CT image reconstructions were also generated. RADIATION DOSE REDUCTION: This exam was performed according to the departmental dose-optimization program which includes automated exposure control, adjustment of the mA and/or kV according to patient size and/or use of iterative reconstruction technique. COMPARISON:  None Available. FINDINGS: CT THORACIC SPINE FINDINGS Alignment: No traumatic malalignment. Exaggerated thoracic kyphosis. Vertebrae: Generalized osteopenia. No acute fracture or incidental bone lesion. Paraspinal and other soft tissues: Negative for perispinal mass or inflammation. Bands of atelectasis at the lung bases with small pleural effusions. Biapical emphysema. Disc levels: Generalized thoracic degenerative disc narrowing and endplate irregularity. No bony impingement. CT LUMBAR SPINE FINDINGS Segmentation: 5 lumbar type vertebrae Alignment: Mild L4-5 anterolisthesis, facet mediated Vertebrae: No acute fracture or focal pathologic process. Solid L5-S1 fusion. Paraspinal and other soft tissues: Atheromatous plaque. No perispinal inflammatory or masslike finding Disc levels: Advanced facet degeneration at L4-5 with anterolisthesis. The disc is narrowed and bulging with mild bilateral foraminal narrowing. Notable L3-4 foraminal disc bulging as well. IMPRESSION: 1. No acute fracture or traumatic malalignment 2. Generalized thoracic disc degeneration with thoracic kyphosis and L4-5 anterolisthesis. 3. L5-S1 solid fusion. Electronically Signed   By: Tiburcio Pea M.D.   On: 11/13/2022 12:44     Medical Consultants:   None.   Subjective:    DEMECIA NORTHWAY relates back pain is worse after the fall.  Objective:    Vitals:  11/13/22 1440 11/13/22 1800 11/13/22 2155 11/14/22 0531  BP: (!) 121/50 (!) 119/56 (!) 102/50 137/63  Pulse: 76  71 65  Resp:  18  18   Temp: 98.2 F (36.8 C)  98.6 F (37 C) (!) 97.5 F (36.4 C)  TempSrc: Oral  Oral Oral  SpO2: 97%  94% 99%  Weight:      Height:       SpO2: 99 % O2 Flow Rate (L/min): 2 L/min  No intake or output data in the 24 hours ending 11/14/22 0751  Filed Weights   10/31/22 1738 10/31/22 2200  Weight: 59.9 kg 57.5 kg    Exam: General exam: In no acute distress. Respiratory system: Good air movement and clear to auscultation. Cardiovascular system: S1 & S2 heard, RRR. No JVD. Gastrointestinal system: Abdomen is nondistended, soft and nontender.  Extremities: No pedal edema. Skin: No rashes, lesions or ulcers Psychiatry: Judgement and insight appear normal. Mood & affect appropriate.   Data Reviewed:    Labs: Basic Metabolic Panel: Recent Labs  Lab 11/08/22 0241 11/09/22 0500 11/10/22 1200  NA 137 139 138  K 4.1 3.7 3.9  CL 104 104 105  CO2 30 30 29   GLUCOSE 102* 111* 101*  BUN 7* 8 7*  CREATININE 0.62 0.60 0.50  CALCIUM 8.4* 8.5* 8.5*    GFR Estimated Creatinine Clearance: 61.1 mL/min (by C-G formula based on SCr of 0.5 mg/dL). Liver Function Tests: Recent Labs  Lab 11/08/22 0241 11/09/22 0500 11/10/22 1200  AST 11* 11* 15  ALT 7 7 8   ALKPHOS 106 108 102  BILITOT 0.3 0.3 0.1*  PROT 4.9* 4.8* 5.1*  ALBUMIN 2.3* 2.2* 2.3*    No results for input(s): "LIPASE", "AMYLASE" in the last 168 hours. No results for input(s): "AMMONIA" in the last 168 hours. Coagulation profile No results for input(s): "INR", "PROTIME" in the last 168 hours. COVID-19 Labs  No results for input(s): "DDIMER", "FERRITIN", "LDH", "CRP" in the last 72 hours.  Lab Results  Component Value Date   SARSCOV2NAA NEGATIVE 05/04/2022   SARSCOV2NAA NEGATIVE 05/19/2021    CBC: Recent Labs  Lab 11/08/22 0241 11/09/22 0500 11/10/22 1200  WBC 7.6 7.3 6.7  NEUTROABS 4.2 4.2  --   HGB 8.7* 8.9* 8.7*  HCT 29.4* 28.6* 29.4*  MCV 83.8 82.2 83.5  PLT 288 272 265    Cardiac  Enzymes: No results for input(s): "CKTOTAL", "CKMB", "CKMBINDEX", "TROPONINI" in the last 168 hours. BNP (last 3 results) No results for input(s): "PROBNP" in the last 8760 hours. CBG: No results for input(s): "GLUCAP" in the last 168 hours. D-Dimer: No results for input(s): "DDIMER" in the last 72 hours. Hgb A1c: No results for input(s): "HGBA1C" in the last 72 hours. Lipid Profile: No results for input(s): "CHOL", "HDL", "LDLCALC", "TRIG", "CHOLHDL", "LDLDIRECT" in the last 72 hours. Thyroid function studies: No results for input(s): "TSH", "T4TOTAL", "T3FREE", "THYROIDAB" in the last 72 hours.  Invalid input(s): "FREET3" Anemia work up: No results for input(s): "VITAMINB12", "FOLATE", "FERRITIN", "TIBC", "IRON", "RETICCTPCT" in the last 72 hours. Sepsis Labs: Recent Labs  Lab 11/08/22 0241 11/09/22 0500 11/10/22 1200  WBC 7.6 7.3 6.7    Microbiology No results found for this or any previous visit (from the past 240 hour(s)).    Medications:    acetaminophen  1,000 mg Oral Q6H   Or   acetaminophen  650 mg Rectal Q6H   amLODipine  5 mg Oral Daily   amoxicillin-clavulanate  1 tablet Oral  Q12H   Chlorhexidine Gluconate Cloth  6 each Topical Daily   enoxaparin (LOVENOX) injection  40 mg Subcutaneous Q24H   levETIRAcetam  500 mg Oral BID   multivitamin with minerals  1 tablet Oral Daily   pantoprazole  40 mg Oral BID   sucralfate  1 g Oral TID WC & HS   tiZANidine  4 mg Oral TID   Continuous Infusions:      LOS: 14 days   Marinda Elk  [facility]  11/14/2022, 7:51 AM

## 2022-11-14 NOTE — Progress Notes (Signed)
Nutrition Follow-up  DOCUMENTATION CODES:   Non-severe (moderate) malnutrition in context of chronic illness  INTERVENTION:   Continue Multivitamin w/ minerals daily Encouraged good PO intake  NUTRITION DIAGNOSIS:   Moderate Malnutrition related to chronic illness as evidenced by severe muscle depletion, moderate fat depletion  GOAL:   Patient will meet greater than or equal to 90% of their needs - Ongoing  MONITOR:   PO intake, Skin, Labs, I & O's  REASON FOR ASSESSMENT:   Malnutrition Screening Tool    ASSESSMENT:   patient hospitalized last month due to sepsis/pneumonia. She is a 66 yo female with chronic pain syndrome who presented with abdominal pain. Patient septic secondary to peritonitis (perforated stomach). Surgery consulted and no immediate intervention planned.  11/06 - admitted to [facility] 11/07 - diet advanced to clear liquids 11/09 - transferred to [facility] 11/10 - NPO 11/12 - diet advanced to clear liquids 11/13 - Upper GI; diet advanced to Heart Healthy 11/14 - liberalized diet to regular  Pt laying in bed. Reports that she is in a lot be pain. RD observed pt lunch tray - untouched; she shared that she cannot eat because she is still in pain. States that her appetite has not changed since last week and she thinks she is eating about the same. Does not think that she has lost any weight during the  two weeks she has been admitted.  Meal Intake  11/15-11/18: 20-100% x 4 meals (average 67.5%)  Pt stable for discharge, pending insurance approval for SNF.  Medications reviewed and include: Augmentin, MVI, Protonix, Sucralfate Labs reviewed.   Nutrition Focused Physical Exam  Flowsheet Row Most Recent Value  Orbital Region Moderate depletion  Upper Arm Region Moderate depletion  Thoracic and Lumbar Region Moderate depletion  Buccal Region Moderate depletion  Temple Region Severe depletion  Clavicle Bone Region Severe depletion  Clavicle and Acromion Bone  Region Severe depletion  Scapular Bone Region Severe depletion  Dorsal Hand Mild depletion  Patellar Region Moderate depletion  Anterior Thigh Region Moderate depletion  Posterior Calf Region Moderate depletion  Edema (RD Assessment) None  Hair Reviewed  Eyes Reviewed  Mouth Reviewed  Skin Reviewed  Nails Reviewed   Diet Order:   Diet Order             Diet regular Room service appropriate? Yes; Fluid consistency: Thin  Diet effective now                  EDUCATION NEEDS:   No education needs have been identified at this time  Skin:  Skin Assessment: Skin Integrity Issues: Skin Integrity Issues:: Stage II Stage II: sacrum  Last BM:  11/20  Height:  Ht Readings from Last 1 Encounters:  10/31/22 5\' 6"  (1.676 m)   Weight:  Wt Readings from Last 1 Encounters:  10/31/22 57.5 kg   BMI:  Body mass index is 20.46 kg/m.  Estimated Nutritional Needs:   Kcal:  1700-1900  Protein:  85-100 grams  Fluid:  >/= 1.7 L    13/06/23 RD, LDN Clinical Dietitian See [facility] for contact information.

## 2022-11-14 NOTE — Progress Notes (Signed)
Received call to assist pt back to bed from [facility] and reported to pt room within 5 minutes. Upon entering room, pt was found back in bed. She stated she had gotten up independently.   I stressed importance of waiting for staff assistance before getting up due to pt's high fall risk and previous fall on 11/18. Pt states "I am not going to fall" and refusing non slip socks. Bed alarm on and call light within reach.

## 2022-11-14 NOTE — Plan of Care (Signed)

## 2022-11-15 DIAGNOSIS — K659 Peritonitis, unspecified: Secondary | ICD-10-CM | POA: Diagnosis not present

## 2022-11-15 MED ORDER — SODIUM CHLORIDE 0.9 % IV BOLUS
500.0000 mL | Freq: Once | INTRAVENOUS | Status: AC
  Administered 2022-11-15: 500 mL via INTRAVENOUS

## 2022-11-15 MED ORDER — PSYLLIUM 95 % PO PACK
1.0000 | PACK | Freq: Every day | ORAL | Status: DC
  Administered 2022-11-16 – 2022-11-25 (×3): 1 via ORAL
  Filled 2022-11-15 (×14): qty 1

## 2022-11-15 NOTE — TOC Progression Note (Signed)
Transition of Care [facility]) - Progression Note    Patient Details  Name: LORILYNN LEHR MRN: [id number] Date of Birth: [birth year]  [facility] CM/SW Contact  Delilah Shan, LCSWA Phone Number: 11/15/2022, 11:49 AM  Clinical Narrative:     CSW spoke with Irving Burton with [facility] who confirmed patients insurance authorization still pending. CSW will continue to follow and assist with patients dc planning needs.  Expected Discharge Plan: Home w Home Health Services Barriers to Discharge: Continued Medical Work up  Expected Discharge Plan and Services Expected Discharge Plan: Home w Home Health Services In-house Referral: Clinical Social Work     Living arrangements for the past 2 months: Single Family Home                                       Social Determinants of Health (SDOH) Interventions    Readmission Risk Interventions    11/01/2022   12:39 PM 10/04/2022    3:42 PM 07/28/2022    1:55 PM  Readmission Risk Prevention Plan  Transportation Screening Complete Complete Complete  Medication Review Oceanographer) Complete Complete Complete  PCP or Specialist appointment within 3-5 days of discharge   Not Complete  HRI or Home Care Consult  Complete Complete  SW Recovery Care/Counseling Consult Complete Complete Complete  Palliative Care Screening Not Applicable Not Applicable Not Applicable  Skilled Nursing Facility  Not Applicable Not Applicable

## 2022-11-15 NOTE — Progress Notes (Signed)
[facility] CONSULT PROGRESS NOTE    Progress Note  Jamie [name]  DSK:[id number] DOB: [birth year] DOA: 10/31/2022 PCP: Barbie Banner, MD     Brief Narrative:   Jamie [name] is an 66 y.o. female past medical history of anxiety alcohol abuse in remission, chronic back pain, history of seizure disorder and discharged from the hospital on October 04, 2022 she was treated for sepsis secondary to pneumonia treated with pressors hospital stay was complicated by seizures comes in again for right upper quadrant abdominal pain that began on 10/29/2022 in the ED CT was done that showed free air in the abdomen, was found to be shock in the perforated viscus and peritonitis, surgery was consulted.  CT also showed intrahepatic and extrahepatic duct dilation with a CBD stone and pancreatic atrophy with pancreatic duct dilation she was started empirically on cefepime and Flagyl.  Now on augmentin.  Clinically stable to be discharged to skilled nursing facility   Assessment/Plan:   Septic shock secondary to peritonitis due to perforated peptic ulcer in the setting of BC powder use: Sepsis now resolved. Off pressors, continue cefepime and Flagyl per surgery. We will not increase IV narcotics. Clinically stable to be transferred to skilled nursing facility.  Awaiting insurance of approval for discharge.  Acute perforated peptic ulcer disease peritonitis (HCC) Conservative management for now, continue oral Augmentin.  Choledocholithiasis biliary extraperitoneal hepatic ductal dilation: MRCP showed ductal dilation to small stone measuring 4 mm with mildly dilated gallbladder and pericholecystic fluid to go to go for acute cholecystitis. GI will follow-up as an outpatient.  Pancreatic ductal dilation: Her CT was done that showed similar appearing pancreatic duct in 2017 unchanged from previous.  Acute on chronic blood loss anemia No signs of overt bleeding. Continue PPI twice a day and  Carafate. Hemoglobin has remained stable .  History of seizure disorder: Unprovoked change Keppra to oral.  Sinus bradycardia: Has resolved.  Opiate dependence: Continue current regimen pain seems to be controlled.  Anxiety: Continue Xanax 3 times a day seems to be stable.  Hypokalemia: Replete improved.  Low back pain after fall: CT scan of lumbar spine and thoracic spine have been ordered.  Narcotic seeking behavior: Noted.  Sacral cubitus ulcer stage II present on admission RN Pressure Injury Documentation: Pressure Injury 10/31/22 Sacrum Medial Stage 2 -  Partial thickness loss of dermis presenting as a shallow open injury with a red, pink wound bed without slough. (Active)  10/31/22 2322  Location: Sacrum  Location Orientation: Medial  Staging: Stage 2 -  Partial thickness loss of dermis presenting as a shallow open injury with a red, pink wound bed without slough.  Wound Description (Comments):   Present on Admission: Yes  Dressing Type Foam - Lift dressing to assess site every shift 11/14/22 2046    DVT prophylaxis: lovenox Family Communication:non4 Status is: Inpatient Remains inpatient appropriate because: Acute perforated peptic ulcer disease with peritonitis    Code Status:     Code Status Orders  (From admission, onward)           Start     Ordered   10/31/22 1734  Full code  Continuous        10/31/22 1747           Code Status History     Date Active Date Inactive Code Status Order ID Comments User Context   09/29/2022 0431 10/04/2022 2211 Full Code [id number]  Tim Lair, PA-C ED   07/27/2022 2304  07/30/2022 2000 Full Code [id number]  Lilyan Gilford, DO Inpatient   07/18/2022 2114 07/23/2022 0012 Full Code [id number]  Shon Hale, MD ED   09/21/2016 0441 09/21/2016 1627 Full Code [id number]  Devoria Albe, MD ED   05/08/2016 2307 05/10/2016 1521 Full Code [id number]  Opyd, Lavone Neri, MD ED         IV Access:   Peripheral  IV   Procedures and diagnostic studies:   CT LUMBAR SPINE WO CONTRAST  Result Date: 11/13/2022 CLINICAL DATA:  Fall with increased fracture risk. EXAM: CT THORACIC AND LUMBAR SPINE WITHOUT CONTRAST TECHNIQUE: Multidetector CT imaging of the thoracic and lumbar spine was performed without contrast. Multiplanar CT image reconstructions were also generated. RADIATION DOSE REDUCTION: This exam was performed according to the departmental dose-optimization program which includes automated exposure control, adjustment of the mA and/or kV according to patient size and/or use of iterative reconstruction technique. COMPARISON:  None Available. FINDINGS: CT THORACIC SPINE FINDINGS Alignment: No traumatic malalignment. Exaggerated thoracic kyphosis. Vertebrae: Generalized osteopenia. No acute fracture or incidental bone lesion. Paraspinal and other soft tissues: Negative for perispinal mass or inflammation. Bands of atelectasis at the lung bases with small pleural effusions. Biapical emphysema. Disc levels: Generalized thoracic degenerative disc narrowing and endplate irregularity. No bony impingement. CT LUMBAR SPINE FINDINGS Segmentation: 5 lumbar type vertebrae Alignment: Mild L4-5 anterolisthesis, facet mediated Vertebrae: No acute fracture or focal pathologic process. Solid L5-S1 fusion. Paraspinal and other soft tissues: Atheromatous plaque. No perispinal inflammatory or masslike finding Disc levels: Advanced facet degeneration at L4-5 with anterolisthesis. The disc is narrowed and bulging with mild bilateral foraminal narrowing. Notable L3-4 foraminal disc bulging as well. IMPRESSION: 1. No acute fracture or traumatic malalignment 2. Generalized thoracic disc degeneration with thoracic kyphosis and L4-5 anterolisthesis. 3. L5-S1 solid fusion. Electronically Signed   By: Tiburcio Pea M.D.   On: 11/13/2022 12:44   CT THORACIC SPINE WO CONTRAST  Result Date: 11/13/2022 CLINICAL DATA:  Fall with increased  fracture risk. EXAM: CT THORACIC AND LUMBAR SPINE WITHOUT CONTRAST TECHNIQUE: Multidetector CT imaging of the thoracic and lumbar spine was performed without contrast. Multiplanar CT image reconstructions were also generated. RADIATION DOSE REDUCTION: This exam was performed according to the departmental dose-optimization program which includes automated exposure control, adjustment of the mA and/or kV according to patient size and/or use of iterative reconstruction technique. COMPARISON:  None Available. FINDINGS: CT THORACIC SPINE FINDINGS Alignment: No traumatic malalignment. Exaggerated thoracic kyphosis. Vertebrae: Generalized osteopenia. No acute fracture or incidental bone lesion. Paraspinal and other soft tissues: Negative for perispinal mass or inflammation. Bands of atelectasis at the lung bases with small pleural effusions. Biapical emphysema. Disc levels: Generalized thoracic degenerative disc narrowing and endplate irregularity. No bony impingement. CT LUMBAR SPINE FINDINGS Segmentation: 5 lumbar type vertebrae Alignment: Mild L4-5 anterolisthesis, facet mediated Vertebrae: No acute fracture or focal pathologic process. Solid L5-S1 fusion. Paraspinal and other soft tissues: Atheromatous plaque. No perispinal inflammatory or masslike finding Disc levels: Advanced facet degeneration at L4-5 with anterolisthesis. The disc is narrowed and bulging with mild bilateral foraminal narrowing. Notable L3-4 foraminal disc bulging as well. IMPRESSION: 1. No acute fracture or traumatic malalignment 2. Generalized thoracic disc degeneration with thoracic kyphosis and L4-5 anterolisthesis. 3. L5-S1 solid fusion. Electronically Signed   By: Tiburcio Pea M.D.   On: 11/13/2022 12:44     Medical Consultants:   None.   Subjective:    Jamie [name] continues to complain of pain all over.  Objective:  Vitals:   11/14/22 0800 11/14/22 1603 11/14/22 2025 11/15/22 0347  BP: (!) 123/58 (!) 105/48 (!) 102/50  (!) 102/55  Pulse: 72 78 79 74  Resp: 17 18 18 18   Temp: 97.9 F (36.6 C) 98.4 F (36.9 C) 99.5 F (37.5 C) 98.1 F (36.7 C)  TempSrc: Oral Oral Oral Oral  SpO2: 100% 97% 94% 97%  Weight:      Height:       SpO2: 97 % O2 Flow Rate (L/min): 2 L/min   Intake/Output Summary (Last 24 hours) at 11/15/2022 0828 Last data filed at 11/14/2022 1400 Gross per 24 hour  Intake 220 ml  Output --  Net 220 ml    Filed Weights   10/31/22 1738 10/31/22 2200  Weight: 59.9 kg 57.5 kg    Exam: General exam: In no acute distress. Respiratory system: Good air movement and clear to auscultation. Cardiovascular system: S1 & S2 heard, RRR. No JVD. Gastrointestinal system: Abdomen is nondistended, soft and nontender.  Extremities: No pedal edema. Skin: No rashes, lesions or ulcers Psychiatry: Judgement and insight appear normal. Mood & affect appropriate.   Data Reviewed:    Labs: Basic Metabolic Panel: Recent Labs  Lab 11/09/22 0500 11/10/22 1200  NA 139 138  K 3.7 3.9  CL 104 105  CO2 30 29  GLUCOSE 111* 101*  BUN 8 7*  CREATININE 0.60 0.50  CALCIUM 8.5* 8.5*    GFR Estimated Creatinine Clearance: 61.1 mL/min (by C-G formula based on SCr of 0.5 mg/dL). Liver Function Tests: Recent Labs  Lab 11/09/22 0500 11/10/22 1200  AST 11* 15  ALT 7 8  ALKPHOS 108 102  BILITOT 0.3 0.1*  PROT 4.8* 5.1*  ALBUMIN 2.2* 2.3*    No results for input(s): "LIPASE", "AMYLASE" in the last 168 hours. No results for input(s): "AMMONIA" in the last 168 hours. Coagulation profile No results for input(s): "INR", "PROTIME" in the last 168 hours. COVID-19 Labs  No results for input(s): "DDIMER", "FERRITIN", "LDH", "CRP" in the last 72 hours.  Lab Results  Component Value Date   SARSCOV2NAA NEGATIVE 05/04/2022   SARSCOV2NAA NEGATIVE 05/19/2021    CBC: Recent Labs  Lab 11/09/22 0500 11/10/22 1200  WBC 7.3 6.7  NEUTROABS 4.2  --   HGB 8.9* 8.7*  HCT 28.6* 29.4*  MCV 82.2 83.5   PLT 272 265    Cardiac Enzymes: No results for input(s): "CKTOTAL", "CKMB", "CKMBINDEX", "TROPONINI" in the last 168 hours. BNP (last 3 results) No results for input(s): "PROBNP" in the last 8760 hours. CBG: No results for input(s): "GLUCAP" in the last 168 hours. D-Dimer: No results for input(s): "DDIMER" in the last 72 hours. Hgb A1c: No results for input(s): "HGBA1C" in the last 72 hours. Lipid Profile: No results for input(s): "CHOL", "HDL", "LDLCALC", "TRIG", "CHOLHDL", "LDLDIRECT" in the last 72 hours. Thyroid function studies: No results for input(s): "TSH", "T4TOTAL", "T3FREE", "THYROIDAB" in the last 72 hours.  Invalid input(s): "FREET3" Anemia work up: No results for input(s): "VITAMINB12", "FOLATE", "FERRITIN", "TIBC", "IRON", "RETICCTPCT" in the last 72 hours. Sepsis Labs: Recent Labs  Lab 11/09/22 0500 11/10/22 1200  WBC 7.3 6.7    Microbiology No results found for this or any previous visit (from the past 240 hour(s)).    Medications:    acetaminophen  1,000 mg Oral Q6H   Or   acetaminophen  650 mg Rectal Q6H   amLODipine  5 mg Oral Daily   amoxicillin-clavulanate  1 tablet Oral Q12H  Chlorhexidine Gluconate Cloth  6 each Topical Daily   enoxaparin (LOVENOX) injection  40 mg Subcutaneous Q24H   levETIRAcetam  500 mg Oral BID   multivitamin with minerals  1 tablet Oral Daily   pantoprazole  40 mg Oral BID   sucralfate  1 g Oral TID WC & HS   tiZANidine  4 mg Oral TID   Continuous Infusions:      LOS: 15 days   Marinda Elk  [facility]  11/15/2022, 8:28 AM

## 2022-11-16 DIAGNOSIS — K659 Peritonitis, unspecified: Secondary | ICD-10-CM | POA: Diagnosis not present

## 2022-11-16 DIAGNOSIS — K8050 Calculus of bile duct without cholangitis or cholecystitis without obstruction: Secondary | ICD-10-CM | POA: Diagnosis not present

## 2022-11-16 DIAGNOSIS — K251 Acute gastric ulcer with perforation: Secondary | ICD-10-CM | POA: Diagnosis not present

## 2022-11-16 DIAGNOSIS — R569 Unspecified convulsions: Secondary | ICD-10-CM | POA: Diagnosis not present

## 2022-11-16 NOTE — TOC Progression Note (Signed)
Transition of Care [facility]) - Progression Note    Patient Details  Name: Jamie [name] MRN: [id number] Date of Birth: May 03, 1956  Transition of Care [facility]) CM/SW Contact  Delilah Shan, LCSWA Phone Number: 11/16/2022, 12:13 PM  Clinical Narrative:     Patient has SNF bed at [facility]. Irving Burton with [facility] informed CSW patients insurance authorization still pending. CSW will continue to follow and assist with patients dc planning needs.  Expected Discharge Plan: Home w Home Health Services Barriers to Discharge: Continued Medical Work up  Expected Discharge Plan and Services Expected Discharge Plan: Home w Home Health Services In-house Referral: Clinical Social Work     Living arrangements for the past 2 months: Single Family Home                                       Social Determinants of Health (SDOH) Interventions    Readmission Risk Interventions    11/01/2022   12:39 PM 10/04/2022    3:42 PM 07/28/2022    1:55 PM  Readmission Risk Prevention Plan  Transportation Screening Complete Complete Complete  Medication Review Oceanographer) Complete Complete Complete  PCP or Specialist appointment within 3-5 days of discharge   Not Complete  HRI or Home Care Consult  Complete Complete  SW Recovery Care/Counseling Consult Complete Complete Complete  Palliative Care Screening Not Applicable Not Applicable Not Applicable  Skilled Nursing Facility  Not Applicable Not Applicable

## 2022-11-16 NOTE — Progress Notes (Signed)
[facility]  PROGRESS NOTE  Jamie [name] VXB:[id number] DOB: 1956/10/08 DOA: 10/31/2022 PCP: Barbie Banner, MD   Brief HPI:   66 year old female with a history of chronic pain syndrome, anxiety, alcohol use in remission, chronic back pain, seizure presenting with worsening upper abdominal pain that began on 10/29/2022.  Notably, the patient was recently discharged from the hospital after a stay from 09/28/2029 to 10/04/2022 where she was treated for sepsis secondary to pneumonia.  The patient required vasopressors for short period of time during the hospitalization.  Her hospital stay was complicated by a seizure for which she was started on Keppra.  The patient was also noted to have symptomatic bradycardia which was thought to be due to her metabolic abnormalities.  This resolved with treatment of her acute medical issues.  The patient was discharged home with home health PT. Apparently, the patient had progressive weakness since 10/29/2022 to the point where she was unable to get off the couch.    In the ED, the patient bradycardic and hypotensive.  She was started on vasopressors.  WBC 29.7. CT of the abdomen and pelvis showed foci of free air in the upper abdomen with moderate thickening of the gastric cardia and distal gastric body.  There was also marked intrahepatic and extrahepatic ductal dilatation with a 4 mm round density in the distal CBD.  There was also pancreatic atrophy and pancreatic duct dilatation.  The patient was started on cefepime and metronidazole and IV fluids.       Subjective   Patient seen and examined, still complains of nausea and abdominal pain.   Assessment/Plan:    Septic shock -Secondary to peritonitis and perforated viscus, resolved -Patient has been weaned off vasopressors and has remained stable without them -Lactic acid peaked to 2.1 -Patient was started on  cefepime and metronidazole, switched  to Augmentin -UA was negative for pyuria -Blood  cultures x2 showed no growth  Peritonitis -Secondary to microperforation in her stomach as seen on CT abdomen/pelvis -Patient says that she has been using BC powders on a daily basis for several years due to rheumatoid arthritis -General surgery felt that patient microperforation might have sealed, she was started on clear liquid diet -Repeat CT abdomen pelvis was done which did not show pneumoperitoneum; patient treated conservatively with antibiotics -General surgery recommended to treat total 14 days of antibiotics, patient has been on Augmentin since 11/09/2022 he received cefepime and Flagyl from 10/31/2022 to 11/09/2022 -Continue PPI twice daily and Carafate  Biliary ductal dilatation -Noted on CT abdomen/pelvis -There was concern for possible choledocholithiasis -MRCP showed distal common bile duct measuring 12 mm with 2 small stones in the mid duct measuring up to 4 mm; sludge in the dilated gallbladder with pericholecystic fluid acute focal for acute cholecystitis -Gastroenterology was  consulted for possible ERCP; no plan for ERCP in the hospital -GI to follow-up as outpatient  Seizure disorder -Patient had unprovoked seizure during her last hospital admission -Currently on Keppra   Bradycardia -Seems to have resolved -Continue monitoring on telemetry  Opioid dependence -Patient received oxycodone 10 mg, number 120 tablets on a monthly basis -Started oxycodone in the hospital  Anxiety -Continue Xanax 1 mg 3 times daily as needed  Sacral 2 pressure ulcer -Present on admission -Continue local wound care  Low back pain -CT of the lumbar and thoracic spine are unremarkable  Disposition -Awaiting bed at skilled nursing facility   Medications     acetaminophen  1,000 mg Oral Q6H  Or   acetaminophen  650 mg Rectal Q6H   amoxicillin-clavulanate  1 tablet Oral Q12H   Chlorhexidine Gluconate Cloth  6 each Topical Daily   enoxaparin (LOVENOX) injection  40 mg  Subcutaneous Q24H   levETIRAcetam  500 mg Oral BID   multivitamin with minerals  1 tablet Oral Daily   pantoprazole  40 mg Oral BID   psyllium  1 packet Oral Daily   sucralfate  1 g Oral TID WC & HS   tiZANidine  4 mg Oral TID     Data Reviewed:   CBG:  No results for input(s): "GLUCAP" in the last 168 hours.  SpO2: 93 % O2 Flow Rate (L/min): 2 L/min    Vitals:   11/15/22 1829 11/15/22 2018 11/16/22 0508 11/16/22 0830  BP: (!) 89/45 95/68 (!) 103/54 (!) 86/43  Pulse:  75 70 68  Resp:  18 18 16   Temp:  98.4 F (36.9 C) 98.8 F (37.1 C) 98.6 F (37 C)  TempSrc:  Oral Oral Oral  SpO2:  94% 92% 93%  Weight:      Height:          Data Reviewed:  Basic Metabolic Panel: Recent Labs  Lab 11/10/22 1200  NA 138  K 3.9  CL 105  CO2 29  GLUCOSE 101*  BUN 7*  CREATININE 0.50  CALCIUM 8.5*    CBC: Recent Labs  Lab 11/10/22 1200  WBC 6.7  HGB 8.7*  HCT 29.4*  MCV 83.5  PLT 265    LFT Recent Labs  Lab 11/10/22 1200  AST 15  ALT 8  ALKPHOS 102  BILITOT 0.1*  PROT 5.1*  ALBUMIN 2.3*     Antibiotics: Anti-infectives (From admission, onward)    Start     Dose/Rate Route Frequency Ordered Stop   11/09/22 1000  amoxicillin-clavulanate (AUGMENTIN) 875-125 MG per tablet 1 tablet        1 tablet Oral Every 12 hours 11/09/22 0847     11/07/22 0800  metroNIDAZOLE (FLAGYL) IVPB 500 mg  Status:  Discontinued        500 mg 100 mL/hr over 60 Minutes Intravenous Every 12 hours 11/06/22 2001 11/09/22 0847   11/07/22 0300  ceFEPIme (MAXIPIME) 2 g in sodium chloride 0.9 % 100 mL IVPB  Status:  Discontinued        2 g 200 mL/hr over 30 Minutes Intravenous Every 8 hours 11/06/22 1948 11/09/22 0847   11/02/22 1000  ceFEPIme (MAXIPIME) 2 g in sodium chloride 0.9 % 100 mL IVPB  Status:  Discontinued        2 g 200 mL/hr over 30 Minutes Intravenous Every 8 hours 11/02/22 0754 11/06/22 1908   11/01/22 0600  metroNIDAZOLE (FLAGYL) IVPB 500 mg  Status:  Discontinued         500 mg 100 mL/hr over 60 Minutes Intravenous Every 12 hours 10/31/22 1747 11/06/22 1859   10/31/22 2200  ceFEPIme (MAXIPIME) 2 g in sodium chloride 0.9 % 100 mL IVPB  Status:  Discontinued        2 g 200 mL/hr over 30 Minutes Intravenous Every 12 hours 10/31/22 1757 11/02/22 0754   10/31/22 1600  metroNIDAZOLE (FLAGYL) IVPB 500 mg        500 mg 100 mL/hr over 60 Minutes Intravenous  Once 10/31/22 1546 10/31/22 1759   10/31/22 1445  ceFEPIme (MAXIPIME) 2 g in sodium chloride 0.9 % 100 mL IVPB        2 g  200 mL/hr over 30 Minutes Intravenous  Once 10/31/22 1440 10/31/22 1643        DVT prophylaxis: Lovenox  Code Status: Full code  Family Communication:    CONSULTS    Objective    Physical Examination:   General-appears in no acute distress Heart-S1-S2, regular, no murmur auscultated Lungs-clear to auscultation bilaterally, no wheezing or crackles auscultated Abdomen-soft, mild epigastric tenderness, no rigidity or guarding  Extremities-no edema in the lower extremities Neuro-alert, oriented x3, no focal deficit noted   Status is: Inpatient:      Pressure Injury 10/31/22 Sacrum Medial Stage 2 -  Partial thickness loss of dermis presenting as a shallow open injury with a red, pink wound bed without slough. (Active)  10/31/22 2322  Location: Sacrum  Location Orientation: Medial  Staging: Stage 2 -  Partial thickness loss of dermis presenting as a shallow open injury with a red, pink wound bed without slough.  Wound Description (Comments):   Present on Admission: Yes        Jamie [name] S Avel Ogawa   [facility] If 7PM-7AM, please contact night-coverage at [email or web address], Office  [phone]   11/16/2022, 9:02 AM  LOS: 16 days

## 2022-11-16 NOTE — Progress Notes (Signed)
Mobility Specialist - Progress Note   11/16/22 1217  Mobility  Activity Transferred from chair to bed  Level of Assistance Contact guard assist, steadying assist  Assistive Device Front wheel walker  Distance Ambulated (ft) 3 ft  Activity Response Tolerated well  $Mobility charge 1 Mobility    Pt received in recliner requesting to get back to bed. Left in bed w/ call bell in reach and all needs met.   [place] Specialist Please contact via SecureChat or Rehab office at [phone]

## 2022-11-16 NOTE — Progress Notes (Signed)
Mobility Specialist - Progress Note   11/16/22 1000  Mobility  Activity Transferred from bed to chair  Level of Assistance Contact guard assist, steadying assist  Assistive Device Front wheel walker  Distance Ambulated (ft) 3 ft  Activity Response Tolerated well  $Mobility charge 1 Mobility    Pt received in bed agreeable to mobility after encouragement. Left in recliner w/ call bell in reach and all needs met.   [place] Specialist Please contact via SecureChat or Rehab office at [phone]

## 2022-11-16 NOTE — Progress Notes (Signed)
Physical Therapy Treatment Patient Details Name: Jamie [name] MRN: [id number] DOB: 06/08/1956 Today's Date: 11/16/2022   History of Present Illness Pt is a 66 y/o female who presents 10/31/2022 from home where she was found by EMS on her couch, covered in urine and feces, unable to get up. Pt called EMS due to SOB and abdominal pain. She was found to have a probable gastric ulcer. PMH significant for anxiety, gastroenteritis, ETOH abuse.    PT Comments    Pt seen for PT tx with pt reporting need to use restroom but agreeable to trying to walk to bathroom vs use BSC. Pt is able to complete bed mobility with mod I & STS with supervision<>CGA. Pt ambulates in room with CGA increasing to min assist with RW as pt fatigues. Pt requires prolonged time on toilet & to perform peri hygiene. Pt fatigued at end of session, beginning to demonstrate slight BLE knee buckling upon return gait to bed. BP in LUE at end of session: 124/57 mmHg MAP 76. Continue to recommend STR upon d/c as pt unsafe to d/c home alone at this time.    Recommendations for follow up therapy are one component of a multi-disciplinary discharge planning process, led by the attending physician.  Recommendations may be updated based on patient status, additional functional criteria and insurance authorization.  Follow Up Recommendations  Skilled nursing-short term rehab (<3 hours/day) Can patient physically be transported by private vehicle: Yes   Assistance Recommended at Discharge Frequent or constant Supervision/Assistance  Patient can return home with the following A little help with walking and/or transfers;A little help with bathing/dressing/bathroom;Assistance with cooking/housework;Assist for transportation;Help with stairs or ramp for entrance   Equipment Recommendations  None recommended by PT (TBD in next venue)    Recommendations for Other Services       Precautions / Restrictions Precautions Precautions:  Fall Precaution Comments: Premedicate for pain; frequent stools Restrictions Weight Bearing Restrictions: No     Mobility  Bed Mobility Overal bed mobility: Modified Independent Bed Mobility: Supine to Sit, Sit to Supine     Supine to sit: Modified independent (Device/Increase time), HOB elevated Sit to supine: HOB elevated, Modified independent (Device/Increase time)   General bed mobility comments: use of bed rails PRN    Transfers Overall transfer level: Needs assistance Equipment used: Rolling walker (2 wheels) Transfers: Sit to/from Stand Sit to Stand: Supervision, Min guard           General transfer comment: STS from EOB & toilet with use of grab bar & RW, CGA<>supervision overall    Ambulation/Gait Ambulation/Gait assistance: Min guard, Min assist Gait Distance (Feet): 15 Feet (+ 20 ft) Assistive device: Rolling walker (2 wheels) Gait Pattern/deviations: Decreased stride length, Trunk flexed, Decreased step length - right, Decreased step length - left, Decreased dorsiflexion - left, Decreased dorsiflexion - right Gait velocity: decreased     General Gait Details: Pt ambulates bed>toilet, toilet>sink>bed with RW & CGA increasing to min assist as pt fatigues. Pt also requires cuing for BUE hands on RW vs leaning on it with BUE elbows. Pt with forward flexed posture, slightly pushing RW out in front of her throughout gait. Pt with BLE knee buckling beginning towards end of session.   Stairs             Wheelchair Mobility    Modified Rankin (Stroke Patients Only)       Balance Overall balance assessment: Needs assistance Sitting-balance support: Feet supported Sitting balance-Leahy Scale: Good Sitting  balance - Comments: supervision sitting on toilet   Standing balance support: No upper extremity supported, Single extremity supported, During functional activity Standing balance-Leahy Scale: Fair Standing balance comment: fair<>good balance in  standing as pt peforms peri hygiene without overt LOB                            Cognition Arousal/Alertness: Awake/alert Behavior During Therapy: WFL for tasks assessed/performed, Anxious Overall Cognitive Status: Within Functional Limits for tasks assessed                                          Exercises      General Comments General comments (skin integrity, edema, etc.): Pt with continent void & BM on toilet, performs hand hygiene with CGA for standing 2/2 fatigue      Pertinent Vitals/Pain Pain Assessment Pain Assessment: Faces Faces Pain Scale: Hurts even more Pain Location: "where do I not hurt?" Pain Descriptors / Indicators: Grimacing, Guarding, Moaning Pain Intervention(s): Monitored during session, Premedicated before session    Home Living                          Prior Function            PT Goals (current goals can now be found in the care plan section) Acute Rehab PT Goals Patient Stated Goal: Decrease pain PT Goal Formulation: With patient Time For Goal Achievement: 11/22/22 Potential to Achieve Goals: Good Progress towards PT goals: Progressing toward goals    Frequency    Min 2X/week      PT Plan Current plan remains appropriate    Co-evaluation              AM-PAC PT "6 Clicks" Mobility   Outcome Measure  Help needed turning from your back to your side while in a flat bed without using bedrails?: None Help needed moving from lying on your back to sitting on the side of a flat bed without using bedrails?: None Help needed moving to and from a bed to a chair (including a wheelchair)?: A Little Help needed standing up from a chair using your arms (e.g., wheelchair or bedside chair)?: A Little Help needed to walk in hospital room?: A Little Help needed climbing 3-5 steps with a railing? : A Lot 6 Click Score: 19    End of Session   Activity Tolerance: Patient limited by fatigue;Patient  limited by pain;Patient tolerated treatment well Patient left: in bed;with call bell/phone within reach;with bed alarm set   PT Visit Diagnosis: Unsteadiness on feet (R26.81);Pain;Difficulty in walking, not elsewhere classified (R26.2);Muscle weakness (generalized) (M62.81) Pain - Right/Left:  (bilateral) Pain - part of body: Knee (generalized pain)     Time: 7014-1030 PT Time Calculation (min) (ACUTE ONLY): 21 min  Charges:  $Therapeutic Activity: 8-22 mins                     Aleda Grana, PT, DPT 11/16/22, 1:25 PM  Sandi Mariscal 11/16/2022, 1:24 PM

## 2022-11-16 NOTE — Care Management Important Message (Signed)
Important Message  Patient Details  Name: Jamie [name] MRN: [id number] Date of Birth: [birth year]   Medicare Important Message Given:  Yes     Sherilyn Banker 11/16/2022, 1:48 PM

## 2022-11-17 DIAGNOSIS — K8050 Calculus of bile duct without cholangitis or cholecystitis without obstruction: Secondary | ICD-10-CM | POA: Diagnosis not present

## 2022-11-17 DIAGNOSIS — K659 Peritonitis, unspecified: Secondary | ICD-10-CM | POA: Diagnosis not present

## 2022-11-17 DIAGNOSIS — K251 Acute gastric ulcer with perforation: Secondary | ICD-10-CM | POA: Diagnosis not present

## 2022-11-17 LAB — COMPREHENSIVE METABOLIC PANEL
ALT: 9 U/L (ref 0–44)
AST: 14 U/L — ABNORMAL LOW (ref 15–41)
Albumin: 2.4 g/dL — ABNORMAL LOW (ref 3.5–5.0)
Alkaline Phosphatase: 110 U/L (ref 38–126)
Anion gap: 9 (ref 5–15)
BUN: 15 mg/dL (ref 8–23)
CO2: 25 mmol/L (ref 22–32)
Calcium: 9 mg/dL (ref 8.9–10.3)
Chloride: 101 mmol/L (ref 98–111)
Creatinine, Ser: 0.71 mg/dL (ref 0.44–1.00)
GFR, Estimated: >60 mL/min (ref >60)
Glucose, Bld: 109 mg/dL — ABNORMAL HIGH (ref 70–99)
Potassium: 3.9 mmol/L (ref 3.5–5.1)
Sodium: 135 mmol/L (ref 135–145)
Total Bilirubin: 0.2 mg/dL — ABNORMAL LOW (ref 0.3–1.2)
Total Protein: 5.6 g/dL — ABNORMAL LOW (ref 6.5–8.1)

## 2022-11-17 LAB — CBC
HCT: 25 % — ABNORMAL LOW (ref 36.0–46.0)
Hemoglobin: 7.9 g/dL — ABNORMAL LOW (ref 12.0–15.0)
MCH: 25.4 pg — ABNORMAL LOW (ref 26.0–34.0)
MCHC: 31.6 g/dL (ref 30.0–36.0)
MCV: 80.4 fL (ref 80.0–100.0)
Platelets: 326 10*3/uL (ref 150–400)
RBC: 3.11 MIL/uL — ABNORMAL LOW (ref 3.87–5.11)
RDW: 15.9 % — ABNORMAL HIGH (ref 11.5–15.5)
WBC: 6.6 10*3/uL (ref 4.0–10.5)
nRBC: 0 % (ref 0.0–0.2)

## 2022-11-17 NOTE — Progress Notes (Signed)
Contacted [employer] central intake and is informed that Berkley Harvey is still currently pending.

## 2022-11-17 NOTE — Progress Notes (Signed)
Mobility Specialist Progress Note   11/17/22 1458  Mobility  Activity Refused mobility   Pt deferring mobility today during lunch. After max encouragement pt preferred to eat in bed than in chair. Relayed to RN.   Frederico Hamman Mobility Specialist Acute Rehab Office:  [phone]

## 2022-11-17 NOTE — Plan of Care (Signed)

## 2022-11-17 NOTE — Progress Notes (Signed)
[facility]  PROGRESS NOTE  Jamie [name] OZD:[id number] DOB: Jan 04, 1956 DOA: 10/31/2022 PCP: Barbie Banner, MD   Brief HPI:   66 year old female with a history of chronic pain syndrome, anxiety, alcohol use in remission, chronic back pain, seizure presenting with worsening upper abdominal pain that began on 10/29/2022.  Notably, the patient was recently discharged from the hospital after a stay from 09/28/2029 to 10/04/2022 where she was treated for sepsis secondary to pneumonia.  The patient required vasopressors for short period of time during the hospitalization.  Her hospital stay was complicated by a seizure for which she was started on Keppra.  The patient was also noted to have symptomatic bradycardia which was thought to be due to her metabolic abnormalities.  This resolved with treatment of her acute medical issues.  The patient was discharged home with home health PT. Apparently, the patient had progressive weakness since 10/29/2022 to the point where she was unable to get off the couch.    In the ED, the patient bradycardic and hypotensive.  She was started on vasopressors.  WBC 29.7. CT of the abdomen and pelvis showed foci of free air in the upper abdomen with moderate thickening of the gastric cardia and distal gastric body.  There was also marked intrahepatic and extrahepatic ductal dilatation with a 4 mm round density in the distal CBD.  There was also pancreatic atrophy and pancreatic duct dilatation.  The patient was started on cefepime and metronidazole and IV fluids.     Subjective   Patient seen and examined, eating her food in bed.  Denies any complaints.   Assessment/Plan:    Septic shock -Secondary to peritonitis and perforated viscus, resolved -Patient has been weaned off vasopressors and has remained stable without them -Lactic acid peaked to 2.1 -Patient was started on  cefepime and metronidazole, switched  to Augmentin -UA was negative for pyuria -Blood  cultures x2 showed no growth  Peritonitis -Secondary to microperforation in her stomach as seen on CT abdomen/pelvis -Patient says that she has been using BC powders on a daily basis for several years due to rheumatoid arthritis -General surgery felt that patient microperforation might have sealed, she was started on clear liquid diet -Repeat CT abdomen pelvis was done which did not show pneumoperitoneum; patient treated conservatively with antibiotics -General surgery recommended to treat total 14 days of antibiotics, patient has been on Augmentin since 11/09/2022 he received cefepime and Flagyl from 10/31/2022 to 11/09/2022 -Continue PPI twice daily and Carafate -Continue Augmentin for total 14 days  Biliary ductal dilatation -Noted on CT abdomen/pelvis -There was concern for possible choledocholithiasis -MRCP showed distal common bile duct measuring 12 mm with 2 small stones in the mid duct measuring up to 4 mm; sludge in the dilated gallbladder with pericholecystic fluid acute focal for acute cholecystitis -Gastroenterology was  consulted for possible ERCP; no plan for ERCP in the hospital -GI to follow-up as outpatient  Seizure disorder -Patient had unprovoked seizure during her last hospital admission -Currently on Keppra   Normocytic anemia -Hemoglobin stable at 7.9, likely from dilution and due to chronic illness -We will continue to monitor hemoglobin intermittently  Bradycardia -Seems to have resolved -Continue monitoring on telemetry  Opioid dependence -Patient received oxycodone 10 mg, number 120 tablets on a monthly basis -Started oxycodone in the hospital  Anxiety -Continue Xanax 1 mg 3 times daily as needed  Sacral 2 pressure ulcer -Present on admission -Continue local wound care  Low back pain -CT of  the lumbar and thoracic spine are unremarkable  Disposition -Awaiting bed at skilled nursing facility   Medications     acetaminophen  1,000 mg Oral Q6H    Or   acetaminophen  650 mg Rectal Q6H   amoxicillin-clavulanate  1 tablet Oral Q12H   Chlorhexidine Gluconate Cloth  6 each Topical Daily   enoxaparin (LOVENOX) injection  40 mg Subcutaneous Q24H   levETIRAcetam  500 mg Oral BID   multivitamin with minerals  1 tablet Oral Daily   pantoprazole  40 mg Oral BID   psyllium  1 packet Oral Daily   sucralfate  1 g Oral TID WC & HS   tiZANidine  4 mg Oral TID     Data Reviewed:   CBG:  No results for input(s): "GLUCAP" in the last 168 hours.  SpO2: 98 % O2 Flow Rate (L/min): 2 L/min    Vitals:   11/16/22 1721 11/16/22 2036 11/17/22 0523 11/17/22 0846  BP: (!) 100/47 (!) 104/41 (!) 106/49 126/61  Pulse: 71 73 67 74  Resp: 17 16 17 17   Temp: 98.6 F (37 C) 99 F (37.2 C) 98.9 F (37.2 C) 98.2 F (36.8 C)  TempSrc: Oral Oral Oral Oral  SpO2: 95% 94% 98% 98%  Weight:      Height:          Data Reviewed:  Basic Metabolic Panel: Recent Labs  Lab 11/17/22 0148  NA 135  K 3.9  CL 101  CO2 25  GLUCOSE 109*  BUN 15  CREATININE 0.71  CALCIUM 9.0    CBC: Recent Labs  Lab 11/17/22 0148  WBC 6.6  HGB 7.9*  HCT 25.0*  MCV 80.4  PLT 326    LFT Recent Labs  Lab 11/17/22 0148  AST 14*  ALT 9  ALKPHOS 110  BILITOT 0.2*  PROT 5.6*  ALBUMIN 2.4*     Antibiotics: Anti-infectives (From admission, onward)    Start     Dose/Rate Route Frequency Ordered Stop   11/09/22 1000  amoxicillin-clavulanate (AUGMENTIN) 875-125 MG per tablet 1 tablet        1 tablet Oral Every 12 hours 11/09/22 0847     11/07/22 0800  metroNIDAZOLE (FLAGYL) IVPB 500 mg  Status:  Discontinued        500 mg 100 mL/hr over 60 Minutes Intravenous Every 12 hours 11/06/22 2001 11/09/22 0847   11/07/22 0300  ceFEPIme (MAXIPIME) 2 g in sodium chloride 0.9 % 100 mL IVPB  Status:  Discontinued        2 g 200 mL/hr over 30 Minutes Intravenous Every 8 hours 11/06/22 1948 11/09/22 0847   11/02/22 1000  ceFEPIme (MAXIPIME) 2 g in sodium chloride  0.9 % 100 mL IVPB  Status:  Discontinued        2 g 200 mL/hr over 30 Minutes Intravenous Every 8 hours 11/02/22 0754 11/06/22 1908   11/01/22 0600  metroNIDAZOLE (FLAGYL) IVPB 500 mg  Status:  Discontinued        500 mg 100 mL/hr over 60 Minutes Intravenous Every 12 hours 10/31/22 1747 11/06/22 1859   10/31/22 2200  ceFEPIme (MAXIPIME) 2 g in sodium chloride 0.9 % 100 mL IVPB  Status:  Discontinued        2 g 200 mL/hr over 30 Minutes Intravenous Every 12 hours 10/31/22 1757 11/02/22 0754   10/31/22 1600  metroNIDAZOLE (FLAGYL) IVPB 500 mg        500 mg 100 mL/hr over 60 Minutes  Intravenous  Once 10/31/22 1546 10/31/22 1759   10/31/22 1445  ceFEPIme (MAXIPIME) 2 g in sodium chloride 0.9 % 100 mL IVPB        2 g 200 mL/hr over 30 Minutes Intravenous  Once 10/31/22 1440 10/31/22 1643        DVT prophylaxis: Lovenox  Code Status: Full code  Family Communication:    CONSULTS    Objective    Physical Examination:   General-appears in no acute distress Heart-S1-S2, regular, no murmur auscultated Lungs-clear to auscultation bilaterally, no wheezing or crackles auscultated Abdomen-soft, mild tenderness in the epigastric region, no organomegaly Extremities-no edema in the lower extremities   Status is: Inpatient:      Pressure Injury 10/31/22 Sacrum Medial Stage 2 -  Partial thickness loss of dermis presenting as a shallow open injury with a red, pink wound bed without slough. (Active)  10/31/22 2322  Location: Sacrum  Location Orientation: Medial  Staging: Stage 2 -  Partial thickness loss of dermis presenting as a shallow open injury with a red, pink wound bed without slough.  Wound Description (Comments):   Present on Admission: Yes        [place]   [facility] If 7PM-7AM, please contact night-coverage at [email or web address], Office  [phone]   11/17/2022, 2:03 PM  LOS: 17 days

## 2022-11-18 ENCOUNTER — Inpatient Hospital Stay (HOSPITAL_COMMUNITY): Payer: Medicare HMO

## 2022-11-18 DIAGNOSIS — K659 Peritonitis, unspecified: Secondary | ICD-10-CM | POA: Diagnosis not present

## 2022-11-18 DIAGNOSIS — R0781 Pleurodynia: Secondary | ICD-10-CM | POA: Diagnosis not present

## 2022-11-18 DIAGNOSIS — K8050 Calculus of bile duct without cholangitis or cholecystitis without obstruction: Secondary | ICD-10-CM | POA: Diagnosis not present

## 2022-11-18 DIAGNOSIS — K251 Acute gastric ulcer with perforation: Secondary | ICD-10-CM | POA: Diagnosis not present

## 2022-11-18 MED ORDER — LOPERAMIDE HCL 2 MG PO CAPS
2.0000 mg | ORAL_CAPSULE | Freq: Three times a day (TID) | ORAL | Status: DC | PRN
  Administered 2022-11-18 – 2022-11-28 (×13): 2 mg via ORAL
  Filled 2022-11-18 (×13): qty 1

## 2022-11-18 NOTE — Progress Notes (Signed)
[facility]  PROGRESS NOTE  Jamie [name] [id number] DOB: 03/03/1956 DOA: 10/31/2022 PCP: Christain [place], MD   Brief HPI:   66 year old female with a history of chronic pain syndrome, anxiety, alcohol use in remission, chronic back pain, seizure presenting with worsening upper abdominal pain that began on 10/29/2022.  Notably, the patient was recently discharged from the hospital after a stay from 09/28/2029 to 10/04/2022 where she was treated for sepsis secondary to pneumonia.  The patient required vasopressors for short period of time during the hospitalization.  Her hospital stay was complicated by a seizure for which she was started on Keppra.  The patient was also noted to have symptomatic bradycardia which was thought to be due to her metabolic abnormalities.  This resolved with treatment of her acute medical issues.  The patient was discharged home with home health PT. Apparently, the patient had progressive weakness since 10/29/2022 to the point where she was unable to get off the couch.    In the ED, the patient bradycardic and hypotensive.  She was started on vasopressors.  WBC 29.7. CT of the abdomen and pelvis showed foci of free air in the upper abdomen with moderate thickening of the gastric cardia and distal gastric body.  There was also marked intrahepatic and extrahepatic ductal dilatation with a 4 mm round density in the distal CBD.  There was also pancreatic atrophy and pancreatic duct dilatation.  The patient was started on cefepime and metronidazole and IV fluids.     Subjective   Patient complains of pain in the right chest wall..  Her pain in her ribs, worse on the breathing.   Assessment/Plan:    Septic shock -Secondary to peritonitis and perforated viscus, resolved -Patient has been weaned off vasopressors and has remained stable without them -Lactic acid peaked to 2.1 -Patient was started on  cefepime and metronidazole, switched  to Augmentin -UA was negative  for pyuria -Blood cultures x2 showed no growth  Right chest wall pain -S/p fall last week -We will obtain right rib x-ray rib series  Peritonitis -Secondary to microperforation in her stomach as seen on CT abdomen/pelvis -Patient says that she has been using BC powders on a daily basis for several years due to rheumatoid arthritis -General surgery felt that patient microperforation might have sealed, she was started on clear liquid diet -Repeat CT abdomen pelvis was done which did not show pneumoperitoneum; patient treated conservatively with antibiotics -General surgery recommended to treat total 14 days of antibiotics, patient has been on Augmentin since 11/09/2022 he received cefepime and Flagyl from 10/31/2022 to 11/09/2022 -Continue PPI twice daily and Carafate -Continue Augmentin for total 14 days  Biliary ductal dilatation -Noted on CT abdomen/pelvis -There was concern for possible choledocholithiasis -MRCP showed distal common bile duct measuring 12 mm with 2 small stones in the mid duct measuring up to 4 mm; sludge in the dilated gallbladder with pericholecystic fluid acute focal for acute cholecystitis -Gastroenterology was  consulted for possible ERCP; no plan for ERCP in the hospital -GI to follow-up as outpatient  Seizure disorder -Patient had unprovoked seizure during her last hospital admission -Currently on Keppra   Normocytic anemia -Hemoglobin stable at 7.9, likely from dilution and due to chronic illness -We will continue to monitor hemoglobin intermittently  Bradycardia -Seems to have resolved -Continue monitoring on telemetry  Opioid dependence -Patient received oxycodone 10 mg, number 120 tablets on a monthly basis -Started oxycodone in the hospital  Anxiety -Continue Xanax 1 mg  3 times daily as needed  Sacral 2 pressure ulcer -Present on admission -Continue local wound care  Low back pain -CT of the lumbar and thoracic spine are  unremarkable  Disposition -Awaiting bed at skilled nursing facility   Medications     acetaminophen  1,000 mg Oral Q6H   Or   acetaminophen  650 mg Rectal Q6H   amoxicillin-clavulanate  1 tablet Oral Q12H   Chlorhexidine Gluconate Cloth  6 each Topical Daily   enoxaparin (LOVENOX) injection  40 mg Subcutaneous Q24H   levETIRAcetam  500 mg Oral BID   multivitamin with minerals  1 tablet Oral Daily   pantoprazole  40 mg Oral BID   psyllium  1 packet Oral Daily   sucralfate  1 g Oral TID WC & HS   tiZANidine  4 mg Oral TID     Data Reviewed:   CBG:  No results for input(s): "GLUCAP" in the last 168 hours.  SpO2: 100 % O2 Flow Rate (L/min): 2 L/min    Vitals:   11/17/22 0846 11/17/22 1624 11/17/22 2126 11/18/22 0400  BP: 126/61 (!) 97/59 (!) 111/47 121/60  Pulse: 74 69 77 70  Resp: 17 17 20 20   Temp: 98.2 F (36.8 C) 98.5 F (36.9 C) 98.4 F (36.9 C) 98.5 F (36.9 C)  TempSrc: Oral Oral Oral Oral  SpO2: 98% 96% 93% 100%  Weight:      Height:          Data Reviewed:  Basic Metabolic Panel: Recent Labs  Lab 11/17/22 0148  NA 135  K 3.9  CL 101  CO2 25  GLUCOSE 109*  BUN 15  CREATININE 0.71  CALCIUM 9.0    CBC: Recent Labs  Lab 11/17/22 0148  WBC 6.6  HGB 7.9*  HCT 25.0*  MCV 80.4  PLT 326    LFT Recent Labs  Lab 11/17/22 0148  AST 14*  ALT 9  ALKPHOS 110  BILITOT 0.2*  PROT 5.6*  ALBUMIN 2.4*     Antibiotics: Anti-infectives (From admission, onward)    Start     Dose/Rate Route Frequency Ordered Stop   11/09/22 1000  amoxicillin-clavulanate (AUGMENTIN) 875-125 MG per tablet 1 tablet        1 tablet Oral Every 12 hours 11/09/22 0847     11/07/22 0800  metroNIDAZOLE (FLAGYL) IVPB 500 mg  Status:  Discontinued        500 mg 100 mL/hr over 60 Minutes Intravenous Every 12 hours 11/06/22 2001 11/09/22 0847   11/07/22 0300  ceFEPIme (MAXIPIME) 2 g in sodium chloride 0.9 % 100 mL IVPB  Status:  Discontinued        2 g 200 mL/hr  over 30 Minutes Intravenous Every 8 hours 11/06/22 1948 11/09/22 0847   11/02/22 1000  ceFEPIme (MAXIPIME) 2 g in sodium chloride 0.9 % 100 mL IVPB  Status:  Discontinued        2 g 200 mL/hr over 30 Minutes Intravenous Every 8 hours 11/02/22 0754 11/06/22 1908   11/01/22 0600  metroNIDAZOLE (FLAGYL) IVPB 500 mg  Status:  Discontinued        500 mg 100 mL/hr over 60 Minutes Intravenous Every 12 hours 10/31/22 1747 11/06/22 1859   10/31/22 2200  ceFEPIme (MAXIPIME) 2 g in sodium chloride 0.9 % 100 mL IVPB  Status:  Discontinued        2 g 200 mL/hr over 30 Minutes Intravenous Every 12 hours 10/31/22 1757 11/02/22 0754  10/31/22 1600  metroNIDAZOLE (FLAGYL) IVPB 500 mg        500 mg 100 mL/hr over 60 Minutes Intravenous  Once 10/31/22 1546 10/31/22 1759   10/31/22 1445  ceFEPIme (MAXIPIME) 2 g in sodium chloride 0.9 % 100 mL IVPB        2 g 200 mL/hr over 30 Minutes Intravenous  Once 10/31/22 1440 10/31/22 1643        DVT prophylaxis: Lovenox  Code Status: Full code  Family Communication:    CONSULTS    Objective    Physical Examination:  General-appears in no acute distress Heart-S1-S2, regular, no murmur auscultated Lungs-clear to auscultation bilaterally, no wheezing or crackles auscultated Right chest wall-tender to palpation, worse on breathing Abdomen-soft, nontender, no organomegaly Extremities-no edema in the lower extremities Neuro-alert, oriented x3, no focal deficit noted   Status is: Inpatient:      Pressure Injury 10/31/22 Sacrum Medial Stage 2 -  Partial thickness loss of dermis presenting as a shallow open injury with a red, pink wound bed without slough. (Active)  10/31/22 2322  Location: Sacrum  Location Orientation: Medial  Staging: Stage 2 -  Partial thickness loss of dermis presenting as a shallow open injury with a red, pink wound bed without slough.  Wound Description (Comments):   Present on Admission: Yes        Sacred Roa S  Shayn Madole   [facility] If 7PM-7AM, please contact night-coverage at [email or web address], Office  [phone]   11/18/2022, 8:32 AM  LOS: 18 days

## 2022-11-18 NOTE — TOC Progression Note (Signed)
Transition of Care [facility]) - Progression Note    Patient Details  Name: Jamie [name] MRN: [id number] Date of Birth: [birth year]  Transition of Care [facility]) CM/SW Contact  Carley Hammed, [place] Phone Number: 11/18/2022, 11:53 AM  Clinical Narrative:    CSW followed up with facility and was advised authorization is still pending at this time. TOC will continue to monitor and facilitate discharge with approved authorization.   Expected Discharge Plan: Home w Home Health Services Barriers to Discharge: Continued Medical Work up  Expected Discharge Plan and Services Expected Discharge Plan: Home w Home Health Services In-house Referral: Clinical Social Work     Living arrangements for the past 2 months: Single Family Home                                       Social Determinants of Health (SDOH) Interventions    Readmission Risk Interventions    11/01/2022   12:39 PM 10/04/2022    3:42 PM 07/28/2022    1:55 PM  Readmission Risk Prevention Plan  Transportation Screening Complete Complete Complete  Medication Review [employer]) Complete Complete Complete  PCP or Specialist appointment within 3-5 days of discharge   Not Complete  HRI or Home Care Consult  Complete Complete  SW Recovery Care/Counseling Consult Complete Complete Complete  Palliative Care Screening Not Applicable Not Applicable Not Applicable  Skilled Nursing Facility  Not Applicable Not Applicable

## 2022-11-19 DIAGNOSIS — R0781 Pleurodynia: Secondary | ICD-10-CM | POA: Diagnosis not present

## 2022-11-19 DIAGNOSIS — K251 Acute gastric ulcer with perforation: Secondary | ICD-10-CM | POA: Diagnosis not present

## 2022-11-19 DIAGNOSIS — K659 Peritonitis, unspecified: Secondary | ICD-10-CM | POA: Diagnosis not present

## 2022-11-19 DIAGNOSIS — K8050 Calculus of bile duct without cholangitis or cholecystitis without obstruction: Secondary | ICD-10-CM | POA: Diagnosis not present

## 2022-11-19 NOTE — TOC Progression Note (Signed)
Transition of Care [facility]) - Progression Note    Patient Details  Name: Jamie [name] MRN: [id number] Date of Birth: 12-02-1956  Transition of Care [facility]) CM/SW Contact  Donnalee Curry, LCSWA Phone Number: 11/19/2022, 11:52 AM  Clinical Narrative:     SW spoke with Alphonzo Lemmings [address] [phone]) Berkley Harvey still pending  Expected Discharge Plan: Home w Home Health Services Barriers to Discharge: Continued Medical Work up  Expected Discharge Plan and Services Expected Discharge Plan: Home w Home Health Services In-house Referral: Clinical Social Work     Living arrangements for the past 2 months: Single Family Home                                       Social Determinants of Health (SDOH) Interventions    Readmission Risk Interventions    11/01/2022   12:39 PM 10/04/2022    3:42 PM 07/28/2022    1:55 PM  Readmission Risk Prevention Plan  Transportation Screening Complete Complete Complete  Medication Review (RN Care Manager) Complete Complete Complete  PCP or Specialist appointment within 3-5 days of discharge   Not Complete  HRI or Home Care Consult  Complete Complete  SW Recovery Care/Counseling Consult Complete Complete Complete  Palliative Care Screening Not Applicable Not Applicable Not Applicable  Skilled Nursing Facility  Not Applicable Not Applicable

## 2022-11-19 NOTE — Progress Notes (Signed)
[facility]  PROGRESS NOTE  CARRA BRINDLEY KGM:[id number] DOB: 1956/05/20 DOA: 10/31/2022 PCP: Barbie Banner, MD   Brief HPI:   66 year old female with a history of chronic pain syndrome, anxiety, alcohol use in remission, chronic back pain, seizure presenting with worsening upper abdominal pain that began on 10/29/2022.  Notably, the patient was recently discharged from the hospital after a stay from 09/28/2029 to 10/04/2022 where she was treated for sepsis secondary to pneumonia.  The patient required vasopressors for short period of time during the hospitalization.  Her hospital stay was complicated by a seizure for which she was started on Keppra.  The patient was also noted to have symptomatic bradycardia which was thought to be due to her metabolic abnormalities.  This resolved with treatment of her acute medical issues.  The patient was discharged home with home health PT. Apparently, the patient had progressive weakness since 10/29/2022 to the point where she was unable to get off the couch.    In the ED, the patient bradycardic and hypotensive.  She was started on vasopressors.  WBC 29.7. CT of the abdomen and pelvis showed foci of free air in the upper abdomen with moderate thickening of the gastric cardia and distal gastric body.  There was also marked intrahepatic and extrahepatic ductal dilatation with a 4 mm round density in the distal CBD.  There was also pancreatic atrophy and pancreatic duct dilatation.  The patient was started on cefepime and metronidazole and IV fluids.     Subjective   Patient seen and examined, still complains of abdominal pain.   Assessment/Plan:    Septic shock -Secondary to peritonitis and perforated viscus, resolved -Patient has been weaned off vasopressors and has remained stable without them -Lactic acid peaked to 2.1 -Patient was started on  cefepime and metronidazole, switched  to Augmentin -UA was negative for pyuria -Blood cultures x2 showed  no growth  Right chest wall pain -S/p fall last week -Rib x-rays unremarkable  Peritonitis -Secondary to microperforation in her stomach as seen on CT abdomen/pelvis -Patient says that she has been using BC powders on a daily basis for several years due to rheumatoid arthritis -General surgery felt that patient microperforation might have sealed, she was started on clear liquid diet -Repeat CT abdomen pelvis was done which did not show pneumoperitoneum; patient treated conservatively with antibiotics -General surgery recommended to treat total 14 days of antibiotics, patient has been on Augmentin since 11/09/2022 he received cefepime and Flagyl from 10/31/2022 to 11/09/2022 -Continue PPI twice daily and Carafate -Continue Augmentin for total 14 days  Biliary ductal dilatation -Noted on CT abdomen/pelvis -There was concern for possible choledocholithiasis -MRCP showed distal common bile duct measuring 12 mm with 2 small stones in the mid duct measuring up to 4 mm; sludge in the dilated gallbladder with pericholecystic fluid acute focal for acute cholecystitis -Gastroenterology was  consulted for possible ERCP; no plan for ERCP in the hospital -GI to follow-up as outpatient  Seizure disorder -Patient had unprovoked seizure during her last hospital admission -Currently on Keppra   Normocytic anemia -Hemoglobin stable at 7.9, likely from dilution and due to chronic illness -We will continue to monitor hemoglobin intermittently  Bradycardia -Seems to have resolved -Continue monitoring on telemetry  Opioid dependence -Patient received oxycodone 10 mg, number 120 tablets on a monthly basis -Started oxycodone in the hospital -Continue oxycodone as needed  Anxiety -Continue Xanax 1 mg 3 times daily as needed  Sacral 2 pressure ulcer -Present  on admission -Continue local wound care  Low back pain -CT of the lumbar and thoracic spine are unremarkable  Disposition -Awaiting bed  at skilled nursing facility   Medications     acetaminophen  1,000 mg Oral Q6H   Or   acetaminophen  650 mg Rectal Q6H   amoxicillin-clavulanate  1 tablet Oral Q12H   Chlorhexidine Gluconate Cloth  6 each Topical Daily   enoxaparin (LOVENOX) injection  40 mg Subcutaneous Q24H   levETIRAcetam  500 mg Oral BID   multivitamin with minerals  1 tablet Oral Daily   pantoprazole  40 mg Oral BID   psyllium  1 packet Oral Daily   sucralfate  1 g Oral TID WC & HS   tiZANidine  4 mg Oral TID     Data Reviewed:   CBG:  No results for input(s): "GLUCAP" in the last 168 hours.  SpO2: 91 % O2 Flow Rate (L/min): 2 L/min    Vitals:   11/18/22 1512 11/18/22 2027 11/19/22 0432 11/19/22 0807  BP: 121/71 (!) 108/52 (!) 103/41 123/60  Pulse: 80 70 77 74  Resp: 17  17 16   Temp: 98.2 F (36.8 C) 99 F (37.2 C) 99.4 F (37.4 C) 98.5 F (36.9 C)  TempSrc: Oral Oral Oral Oral  SpO2: 100% 95% 95% 91%  Weight:      Height:          Data Reviewed:  Basic Metabolic Panel: Recent Labs  Lab 11/17/22 0148  NA 135  K 3.9  CL 101  CO2 25  GLUCOSE 109*  BUN 15  CREATININE 0.71  CALCIUM 9.0    CBC: Recent Labs  Lab 11/17/22 0148  WBC 6.6  HGB 7.9*  HCT 25.0*  MCV 80.4  PLT 326    LFT Recent Labs  Lab 11/17/22 0148  AST 14*  ALT 9  ALKPHOS 110  BILITOT 0.2*  PROT 5.6*  ALBUMIN 2.4*     Antibiotics: Anti-infectives (From admission, onward)    Start     Dose/Rate Route Frequency Ordered Stop   11/09/22 1000  amoxicillin-clavulanate (AUGMENTIN) 875-125 MG per tablet 1 tablet        1 tablet Oral Every 12 hours 11/09/22 0847     11/07/22 0800  metroNIDAZOLE (FLAGYL) IVPB 500 mg  Status:  Discontinued        500 mg 100 mL/hr over 60 Minutes Intravenous Every 12 hours 11/06/22 2001 11/09/22 0847   11/07/22 0300  ceFEPIme (MAXIPIME) 2 g in sodium chloride 0.9 % 100 mL IVPB  Status:  Discontinued        2 g 200 mL/hr over 30 Minutes Intravenous Every 8 hours  11/06/22 1948 11/09/22 0847   11/02/22 1000  ceFEPIme (MAXIPIME) 2 g in sodium chloride 0.9 % 100 mL IVPB  Status:  Discontinued        2 g 200 mL/hr over 30 Minutes Intravenous Every 8 hours 11/02/22 0754 11/06/22 1908   11/01/22 0600  metroNIDAZOLE (FLAGYL) IVPB 500 mg  Status:  Discontinued        500 mg 100 mL/hr over 60 Minutes Intravenous Every 12 hours 10/31/22 1747 11/06/22 1859   10/31/22 2200  ceFEPIme (MAXIPIME) 2 g in sodium chloride 0.9 % 100 mL IVPB  Status:  Discontinued        2 g 200 mL/hr over 30 Minutes Intravenous Every 12 hours 10/31/22 1757 11/02/22 0754   10/31/22 1600  metroNIDAZOLE (FLAGYL) IVPB 500 mg  500 mg 100 mL/hr over 60 Minutes Intravenous  Once 10/31/22 1546 10/31/22 1759   10/31/22 1445  ceFEPIme (MAXIPIME) 2 g in sodium chloride 0.9 % 100 mL IVPB        2 g 200 mL/hr over 30 Minutes Intravenous  Once 10/31/22 1440 10/31/22 1643        DVT prophylaxis: Lovenox  Code Status: Full code  Family Communication:    CONSULTS    Objective    Physical Examination:  General-appears in no acute distress Heart-S1-S2, regular, no murmur auscultated Lungs-clear to auscultation bilaterally, no wheezing or crackles auscultated Abdomen-soft, mild epigastric tenderness to palpation,  no organomegaly Extremities-no edema in the lower extremities Neuro-alert, oriented x3, no focal deficit noted   Status is: Inpatient:      Pressure Injury 10/31/22 Sacrum Medial Stage 2 -  Partial thickness loss of dermis presenting as a shallow open injury with a red, pink wound bed without slough. (Active)  10/31/22 2322  Location: Sacrum  Location Orientation: Medial  Staging: Stage 2 -  Partial thickness loss of dermis presenting as a shallow open injury with a red, pink wound bed without slough.  Wound Description (Comments):   Present on Admission: Yes        Carrington Olazabal S Rolene Andrades   [facility] If 7PM-7AM, please contact night-coverage at  [email or web address], Office  [phone]   11/19/2022, 9:46 AM  LOS: 19 days

## 2022-11-19 NOTE — Plan of Care (Signed)
  Problem: Education: Goal: Knowledge of General Education information will improve Description Including pain rating scale, medication(s)/side effects and non-pharmacologic comfort measures Outcome: Progressing   Problem: Health Behavior/Discharge Planning: Goal: Ability to manage health-related needs will improve Outcome: Progressing   

## 2022-11-19 NOTE — Progress Notes (Signed)
Mobility Specialist Progress Note   11/19/22 0900  Mobility  Activity Dangled on edge of bed;Refused mobility  Level of Assistance Minimal assist, patient does 75% or more  Assistive Device None  Activity Response Tolerated fair   Patient received in supine, reluctant to participate but eventually agreed. Required min A for bed mobility and upon dangling EOB pt deferred further mobility for unspecified reasons. Denied dizziness/lightheadedness. Returned to supine without incident. Was left with all needs met, call bell in reach.   [place] Norvil Martensen, BS EXP Mobility Specialist Please contact via SecureChat or Rehab office at [phone]

## 2022-11-20 DIAGNOSIS — K8050 Calculus of bile duct without cholangitis or cholecystitis without obstruction: Secondary | ICD-10-CM | POA: Diagnosis not present

## 2022-11-20 DIAGNOSIS — K659 Peritonitis, unspecified: Secondary | ICD-10-CM | POA: Diagnosis not present

## 2022-11-20 DIAGNOSIS — R0781 Pleurodynia: Secondary | ICD-10-CM | POA: Diagnosis not present

## 2022-11-20 DIAGNOSIS — K251 Acute gastric ulcer with perforation: Secondary | ICD-10-CM | POA: Diagnosis not present

## 2022-11-20 MED ORDER — OXYCODONE HCL 5 MG PO TABS
20.0000 mg | ORAL_TABLET | ORAL | Status: DC | PRN
  Administered 2022-11-20 – 2022-11-27 (×31): 20 mg via ORAL
  Filled 2022-11-20 (×31): qty 4

## 2022-11-20 MED ORDER — SACCHAROMYCES BOULARDII 250 MG PO CAPS
250.0000 mg | ORAL_CAPSULE | Freq: Two times a day (BID) | ORAL | Status: DC
  Administered 2022-11-20 – 2022-11-28 (×17): 250 mg via ORAL
  Filled 2022-11-20 (×17): qty 1

## 2022-11-20 NOTE — Progress Notes (Addendum)
[facility]  PROGRESS NOTE  Jamie [name] FIE:[id number] DOB: 08-10-1956 DOA: 10/31/2022 PCP: Barbie Banner, MD   Brief HPI:   66 year old female with a history of chronic pain syndrome, anxiety, alcohol use in remission, chronic back pain, seizure presenting with worsening upper abdominal pain that began on 10/29/2022.  Notably, the patient was recently discharged from the hospital after a stay from 09/28/2029 to 10/04/2022 where she was treated for sepsis secondary to pneumonia.  The patient required vasopressors for short period of time during the hospitalization.  Her hospital stay was complicated by a seizure for which she was started on Keppra.  The patient was also noted to have symptomatic bradycardia which was thought to be due to her metabolic abnormalities.  This resolved with treatment of her acute medical issues.  The patient was discharged home with home health PT. Apparently, the patient had progressive weakness since 10/29/2022 to the point where she was unable to get off the couch.    In the ED, the patient bradycardic and hypotensive.  She was started on vasopressors.  WBC 29.7. CT of the abdomen and pelvis showed foci of free air in the upper abdomen with moderate thickening of the gastric cardia and distal gastric body.  There was also marked intrahepatic and extrahepatic ductal dilatation with a 4 mm round density in the distal CBD.  There was also pancreatic atrophy and pancreatic duct dilatation.  The patient was started on cefepime and metronidazole and IV fluids.     Subjective   Patient seen and examined, has been drowsy whenever I go to examine her for past 3 days.  She has been getting oxycodone 20 mg every 3 hours as needed, and total usually get 120 to 140 mg of oxycodone daily.   Assessment/Plan:    Septic shock -Secondary to peritonitis and perforated viscus, resolved -Patient has been weaned off vasopressors and has remained stable without them -Lactic acid  peaked to 2.1 -Patient was started on  cefepime and metronidazole, switched  to Augmentin -UA was negative for pyuria -Blood cultures x2 showed no growth  Right chest wall pain -S/p fall last week -Rib x-rays unremarkable  Peritonitis -Secondary to microperforation in her stomach as seen on CT abdomen/pelvis -Patient says that she has been using BC powders on a daily basis for several years due to rheumatoid arthritis -General surgery felt that patient microperforation might have sealed, she was started on clear liquid diet -Repeat CT abdomen pelvis was done which did not show pneumoperitoneum; patient treated conservatively with antibiotics -General surgery recommended to treat total 14 days of antibiotics, patient has been on Augmentin since 11/09/2022 he received cefepime and Flagyl from 10/31/2022 to 11/09/2022 -Continue PPI twice daily and Carafate -Continue Augmentin for total 14 days; stop date 11/29  Biliary ductal dilatation -Noted on CT abdomen/pelvis -There was concern for possible choledocholithiasis -MRCP showed distal common bile duct measuring 12 mm with 2 small stones in the mid duct measuring up to 4 mm; sludge in the dilated gallbladder with pericholecystic fluid acute focal for acute cholecystitis -Gastroenterology was  consulted for possible ERCP; no plan for ERCP in the hospital -GI to follow-up as outpatient  Seizure disorder -Patient had unprovoked seizure during her last hospital admission -Currently on Keppra   Normocytic anemia -Hemoglobin stable at 7.9, likely from dilution and due to chronic illness -We will continue to monitor hemoglobin intermittently  Bradycardia -Seems to have resolved -Continue monitoring on telemetry  Hypersomnolence -Patient has been very  somnolent secondary to opioids -Opiates will be slowly tapered off as below -We will start continuous pulse ox monitoring in the hospital  Opioid dependence/?  Pseudo addiction -Patient  received oxycodone 10 mg, number 120 tablets on a monthly basis -Started oxycodone in the hospital -Currently getting oxycodone 20 mg every 3 hours as needed, and total getting 120 to 140 mg of oxycodone daily.  I explained to patient that we have to start cutting down slowly and we will change oxycodone to 20 mg every 4 hours as needed as patient is very drowsy.  Patient was very resistant to changing oxycodone dosing.    Anxiety -Continue Xanax 1 mg 3 times daily as needed  Sacral 2 pressure ulcer -Present on admission -Continue local wound care  Low back pain -CT of the lumbar and thoracic spine are unremarkable  Disposition -Awaiting bed at skilled nursing facility   Medications     acetaminophen  1,000 mg Oral Q6H   Or   acetaminophen  650 mg Rectal Q6H   amoxicillin-clavulanate  1 tablet Oral Q12H   Chlorhexidine Gluconate Cloth  6 each Topical Daily   enoxaparin (LOVENOX) injection  40 mg Subcutaneous Q24H   levETIRAcetam  500 mg Oral BID   multivitamin with minerals  1 tablet Oral Daily   pantoprazole  40 mg Oral BID   psyllium  1 packet Oral Daily   sucralfate  1 g Oral TID WC & HS   tiZANidine  4 mg Oral TID     Data Reviewed:   CBG:  No results for input(s): "GLUCAP" in the last 168 hours.  SpO2: 93 % O2 Flow Rate (L/min): 2 L/min    Vitals:   11/19/22 2056 11/19/22 2157 11/20/22 0624 11/20/22 0740  BP: (!) 96/53 117/68 115/67 (!) 112/54  Pulse: 91 89 96 85  Resp: 18  17 16   Temp: 98.9 F (37.2 C)  99.2 F (37.3 C) 98.5 F (36.9 C)  TempSrc: Oral  Oral Oral  SpO2: 100% 100% 99% 93%  Weight:      Height:          Data Reviewed:  Basic Metabolic Panel: Recent Labs  Lab 11/17/22 0148  NA 135  K 3.9  CL 101  CO2 25  GLUCOSE 109*  BUN 15  CREATININE 0.71  CALCIUM 9.0    CBC: Recent Labs  Lab 11/17/22 0148  WBC 6.6  HGB 7.9*  HCT 25.0*  MCV 80.4  PLT 326    LFT Recent Labs  Lab 11/17/22 0148  AST 14*  ALT 9  ALKPHOS  110  BILITOT 0.2*  PROT 5.6*  ALBUMIN 2.4*     Antibiotics: Anti-infectives (From admission, onward)    Start     Dose/Rate Route Frequency Ordered Stop   11/09/22 1000  amoxicillin-clavulanate (AUGMENTIN) 875-125 MG per tablet 1 tablet        1 tablet Oral Every 12 hours 11/09/22 0847     11/07/22 0800  metroNIDAZOLE (FLAGYL) IVPB 500 mg  Status:  Discontinued        500 mg 100 mL/hr over 60 Minutes Intravenous Every 12 hours 11/06/22 2001 11/09/22 0847   11/07/22 0300  ceFEPIme (MAXIPIME) 2 g in sodium chloride 0.9 % 100 mL IVPB  Status:  Discontinued        2 g 200 mL/hr over 30 Minutes Intravenous Every 8 hours 11/06/22 1948 11/09/22 0847   11/02/22 1000  ceFEPIme (MAXIPIME) 2 g in sodium chloride 0.9 %  100 mL IVPB  Status:  Discontinued        2 g 200 mL/hr over 30 Minutes Intravenous Every 8 hours 11/02/22 0754 11/06/22 1908   11/01/22 0600  metroNIDAZOLE (FLAGYL) IVPB 500 mg  Status:  Discontinued        500 mg 100 mL/hr over 60 Minutes Intravenous Every 12 hours 10/31/22 1747 11/06/22 1859   10/31/22 2200  ceFEPIme (MAXIPIME) 2 g in sodium chloride 0.9 % 100 mL IVPB  Status:  Discontinued        2 g 200 mL/hr over 30 Minutes Intravenous Every 12 hours 10/31/22 1757 11/02/22 0754   10/31/22 1600  metroNIDAZOLE (FLAGYL) IVPB 500 mg        500 mg 100 mL/hr over 60 Minutes Intravenous  Once 10/31/22 1546 10/31/22 1759   10/31/22 1445  ceFEPIme (MAXIPIME) 2 g in sodium chloride 0.9 % 100 mL IVPB        2 g 200 mL/hr over 30 Minutes Intravenous  Once 10/31/22 1440 10/31/22 1643        DVT prophylaxis: Lovenox  Code Status: Full code  Family Communication:    CONSULTS    Objective    Physical Examination:  General-appears in no acute distress Heart-S1-S2, regular, no murmur auscultated Lungs-clear to auscultation bilaterally, no wheezing or crackles auscultated Abdomen-soft, nontender, no organomegaly Extremities-no edema in the lower  extremities Neuro-somnolent but arousable, no focal deficit noted   Status is: Inpatient:      Pressure Injury 10/31/22 Sacrum Medial Stage 2 -  Partial thickness loss of dermis presenting as a shallow open injury with a red, pink wound bed without slough. (Active)  10/31/22 2322  Location: Sacrum  Location Orientation: Medial  Staging: Stage 2 -  Partial thickness loss of dermis presenting as a shallow open injury with a red, pink wound bed without slough.  Wound Description (Comments):   Present on Admission: Yes        [place]   [facility] If 7PM-7AM, please contact night-coverage at [email or web address], Office  [phone]   11/20/2022, 9:46 AM  LOS: 20 days

## 2022-11-20 NOTE — Progress Notes (Signed)
PT Cancellation Note  Patient Details Name: Jamie [name] MRN: [id number] DOB: Mar 31, 1956   Cancelled Treatment:    Reason Eval/Treat Not Completed: Other (comment)  Eating lunch; requested to return for amb;   Van Clines, PT  Acute Rehabilitation Services Office [phone]    Levi Aland 11/20/2022, 5:19 PM

## 2022-11-20 NOTE — Progress Notes (Signed)
Mobility Specialist Progress Note   11/20/22 1400  Mobility  Activity Transferred from bed to chair  Level of Assistance Minimal assist, patient does 75% or more  Assistive Device None (HHA)  Range of Motion/Exercises Active;All extremities  Activity Response Tolerated well   Patient received in supine and agreeable to participate. Deferred ambulation but requested assistance to recliner. Was independent for bed mobility, stood and transferred to recliner with minimal hand held assist. Tolerated without complaint or incident. Was left in recliner with all needs met, call bell in reach.   Jamie [name], BS EXP Mobility Specialist Please contact via SecureChat or Rehab office at [phone]

## 2022-11-21 DIAGNOSIS — K251 Acute gastric ulcer with perforation: Secondary | ICD-10-CM | POA: Diagnosis not present

## 2022-11-21 DIAGNOSIS — K8050 Calculus of bile duct without cholangitis or cholecystitis without obstruction: Secondary | ICD-10-CM | POA: Diagnosis not present

## 2022-11-21 DIAGNOSIS — K659 Peritonitis, unspecified: Secondary | ICD-10-CM | POA: Diagnosis not present

## 2022-11-21 DIAGNOSIS — R0781 Pleurodynia: Secondary | ICD-10-CM | POA: Diagnosis not present

## 2022-11-21 NOTE — Care Management Important Message (Signed)
Important Message  Patient Details  Name: Jamie [name] MRN: [id number] Date of Birth: 01-May-1956   Medicare Important Message Given:  Yes     Sherilyn Banker 11/21/2022, 1:21 PM

## 2022-11-21 NOTE — Progress Notes (Signed)
[facility]  PROGRESS NOTE  Jamie [name] OVF:[id number] DOB: 27-Aug-1956 DOA: 10/31/2022 PCP: Barbie Banner, MD   Brief HPI:   66 year old female with a history of chronic pain syndrome, anxiety, alcohol use in remission, chronic back pain, seizure presenting with worsening upper abdominal pain that began on 10/29/2022.  Notably, the patient was recently discharged from the hospital after a stay from 09/28/2029 to 10/04/2022 where she was treated for sepsis secondary to pneumonia.  The patient required vasopressors for short period of time during the hospitalization.  Her hospital stay was complicated by a seizure for which she was started on Keppra.  The patient was also noted to have symptomatic bradycardia which was thought to be due to her metabolic abnormalities.  This resolved with treatment of her acute medical issues.  The patient was discharged home with home health PT. Apparently, the patient had progressive weakness since 10/29/2022 to the point where she was unable to get off the couch.    In the ED, the patient bradycardic and hypotensive.  She was started on vasopressors.  WBC 29.7. CT of the abdomen and pelvis showed foci of free air in the upper abdomen with moderate thickening of the gastric cardia and distal gastric body.  There was also marked intrahepatic and extrahepatic ductal dilatation with a 4 mm round density in the distal CBD.  There was also pancreatic atrophy and pancreatic duct dilatation.  The patient was started on cefepime and metronidazole and IV fluids.     Subjective   Patient seen and examined, complains of nausea.  No abdominal pain.  She is less somnolent this morning after changing the dose of oxycodone to every 4 hours as needed.   Assessment/Plan:    Septic shock -Secondary to peritonitis and perforated viscus, resolved -Patient has been weaned off vasopressors and has remained stable without them -Lactic acid peaked to 2.1 -Patient was started on   cefepime and metronidazole, switched  to Augmentin -UA was negative for pyuria -Blood cultures x2 showed no growth  Right chest wall pain -S/p fall last week -Rib x-rays unremarkable  Peritonitis -Secondary to microperforation in her stomach as seen on CT abdomen/pelvis -Patient says that she has been using BC powders on a daily basis for several years due to rheumatoid arthritis -General surgery felt that patient microperforation might have sealed, she was started on clear liquid diet -Repeat CT abdomen pelvis was done which did not show pneumoperitoneum; patient treated conservatively with antibiotics -General surgery recommended to treat total 14 days of antibiotics, patient has been on Augmentin since 11/09/2022 he received cefepime and Flagyl from 10/31/2022 to 11/09/2022 -Continue PPI twice daily and Carafate -Continue Augmentin for total 14 days; stop date 11/29  Biliary ductal dilatation -Noted on CT abdomen/pelvis -There was concern for possible choledocholithiasis -MRCP showed distal common bile duct measuring 12 mm with 2 small stones in the mid duct measuring up to 4 mm; sludge in the dilated gallbladder with pericholecystic fluid acute focal for acute cholecystitis -Gastroenterology was  consulted for possible ERCP; no plan for ERCP in the hospital -GI to follow-up as outpatient  Seizure disorder -Patient had unprovoked seizure during her last hospital admission -Currently on Keppra   Normocytic anemia -Hemoglobin stable at 7.9, likely from dilution and due to chronic illness -We will continue to monitor hemoglobin intermittently  Bradycardia -Seems to have resolved -Continue monitoring on telemetry  Hypersomnolence -Patient has been very somnolent secondary to opioids -Opiates will be slowly tapered off as  below -Hypersomnolence has improved -We will continue continuous pulse ox monitoring in the hospital   Opioid dependence/?  Pseudo addiction -Patient  received oxycodone 10 mg, number 120 tablets on a monthly basis -Started oxycodone in the hospital -Currently getting oxycodone 20 mg every 3 hours as needed, and total getting 120 to 140 mg of oxycodone daily.  I explained to patient that we have to start cutting down slowly and we will change oxycodone to 20 mg every 4 hours as needed as patient is very drowsy.  Patient was very resistant to changing oxycodone dosing.    Anxiety -Continue Xanax 1 mg 3 times daily as needed  Sacral 2 pressure ulcer -Present on admission -Continue local wound care  Low back pain -CT of the lumbar and thoracic spine are unremarkable  Disposition -Awaiting bed at skilled nursing facility   Medications     acetaminophen  1,000 mg Oral Q6H   Or   acetaminophen  650 mg Rectal Q6H   amoxicillin-clavulanate  1 tablet Oral Q12H   enoxaparin (LOVENOX) injection  40 mg Subcutaneous Q24H   levETIRAcetam  500 mg Oral BID   multivitamin with minerals  1 tablet Oral Daily   pantoprazole  40 mg Oral BID   psyllium  1 packet Oral Daily   saccharomyces boulardii  250 mg Oral BID   sucralfate  1 g Oral TID WC & HS   tiZANidine  4 mg Oral TID     Data Reviewed:   CBG:  No results for input(s): "GLUCAP" in the last 168 hours.  SpO2: 92 % O2 Flow Rate (L/min): 2 L/min    Vitals:   11/21/22 0200 11/21/22 0432 11/21/22 0528 11/21/22 0918  BP:  (!) 101/53  (!) 113/59  Pulse:  78  89  Resp:  17  17  Temp:  98.2 F (36.8 C)    TempSrc:  Oral    SpO2: 91% (!) 88% 93% 92%  Weight:      Height:          Data Reviewed:  Basic Metabolic Panel: Recent Labs  Lab 11/17/22 0148  NA 135  K 3.9  CL 101  CO2 25  GLUCOSE 109*  BUN 15  CREATININE 0.71  CALCIUM 9.0    CBC: Recent Labs  Lab 11/17/22 0148  WBC 6.6  HGB 7.9*  HCT 25.0*  MCV 80.4  PLT 326    LFT Recent Labs  Lab 11/17/22 0148  AST 14*  ALT 9  ALKPHOS 110  BILITOT 0.2*  PROT 5.6*  ALBUMIN 2.4*      Antibiotics: Anti-infectives (From admission, onward)    Start     Dose/Rate Route Frequency Ordered Stop   11/09/22 1000  amoxicillin-clavulanate (AUGMENTIN) 875-125 MG per tablet 1 tablet        1 tablet Oral Every 12 hours 11/09/22 0847 11/23/22 2359   11/07/22 0800  metroNIDAZOLE (FLAGYL) IVPB 500 mg  Status:  Discontinued        500 mg 100 mL/hr over 60 Minutes Intravenous Every 12 hours 11/06/22 2001 11/09/22 0847   11/07/22 0300  ceFEPIme (MAXIPIME) 2 g in sodium chloride 0.9 % 100 mL IVPB  Status:  Discontinued        2 g 200 mL/hr over 30 Minutes Intravenous Every 8 hours 11/06/22 1948 11/09/22 0847   11/02/22 1000  ceFEPIme (MAXIPIME) 2 g in sodium chloride 0.9 % 100 mL IVPB  Status:  Discontinued  2 g 200 mL/hr over 30 Minutes Intravenous Every 8 hours 11/02/22 0754 11/06/22 1908   11/01/22 0600  metroNIDAZOLE (FLAGYL) IVPB 500 mg  Status:  Discontinued        500 mg 100 mL/hr over 60 Minutes Intravenous Every 12 hours 10/31/22 1747 11/06/22 1859   10/31/22 2200  ceFEPIme (MAXIPIME) 2 g in sodium chloride 0.9 % 100 mL IVPB  Status:  Discontinued        2 g 200 mL/hr over 30 Minutes Intravenous Every 12 hours 10/31/22 1757 11/02/22 0754   10/31/22 1600  metroNIDAZOLE (FLAGYL) IVPB 500 mg        500 mg 100 mL/hr over 60 Minutes Intravenous  Once 10/31/22 1546 10/31/22 1759   10/31/22 1445  ceFEPIme (MAXIPIME) 2 g in sodium chloride 0.9 % 100 mL IVPB        2 g 200 mL/hr over 30 Minutes Intravenous  Once 10/31/22 1440 10/31/22 1643        DVT prophylaxis: Lovenox  Code Status: Full code  Family Communication:    CONSULTS    Objective    Physical Examination:  General-appears in no acute distress Heart-S1-S2, regular, no murmur auscultated Lungs-clear to auscultation bilaterally Abdomen-soft, nontender, no organomegaly Extremities-no edema in the lower extremities Neuro-alert, oriented x3, no focal deficit noted  Status is: Inpatient:       Pressure Injury 10/31/22 Sacrum Medial Stage 2 -  Partial thickness loss of dermis presenting as a shallow open injury with a red, pink wound bed without slough. (Active)  10/31/22 2322  Location: Sacrum  Location Orientation: Medial  Staging: Stage 2 -  Partial thickness loss of dermis presenting as a shallow open injury with a red, pink wound bed without slough.  Wound Description (Comments):   Present on Admission: Yes        Jamie [name] S Camila Maita   [facility] If 7PM-7AM, please contact night-coverage at [email or web address], Office  [phone]   11/21/2022, 9:52 AM  LOS: 21 days

## 2022-11-21 NOTE — Progress Notes (Signed)
Nutrition Follow-up  DOCUMENTATION CODES:   Non-severe (moderate) malnutrition in context of chronic illness  INTERVENTION:   Continue Multivitamin w/ minerals daily Encouraged good PO intake Recommend obtaining a new weight due to no weight in >7 days.   NUTRITION DIAGNOSIS:   Moderate Malnutrition related to chronic illness as evidenced by severe muscle depletion, moderate fat depletion - Ongoing  GOAL:   Patient will meet greater than or equal to 90% of their needs - Ongoing  MONITOR:   PO intake, Skin, Labs, I & O's  REASON FOR ASSESSMENT:   Malnutrition Screening Tool    ASSESSMENT:   patient hospitalized last month due to sepsis/pneumonia. She is a 66 yo female with chronic pain syndrome who presented with abdominal pain. Patient septic secondary to peritonitis (perforated stomach). Surgery consulted and no immediate intervention planned.  11/06 - admitted to [facility] 11/07 - diet advanced to clear liquids 11/09 - transferred to [facility] 11/10 - NPO 11/12 - diet advanced to clear liquids 11/13 - Upper GI; diet advanced to Heart Healthy 11/14 - liberalized diet to regular  Pt laying in bed. Pt reports that she is in pain and having nausea. States that she has not been eating well due to the pain and nausea. RD offered to order pt snacks between meals, pt declined. Pt denied any additional needs at this time.  No new weight on pt in >7 days, notified RN that new weight is needed.   Meal Intake  11/15-11/18: 20-100% x 4 meals (average 67.5%) 11/22-11/27: 0-100% x 8 meals (average 69%)  Pt stable for discharge, pending insurance approval for SNF.  Medications reviewed and include: Augmentin, MVI, Protonix, Psyllium, Florastor, Sucralfate Labs reviewed.   Diet Order:   Diet Order             Diet regular Room service appropriate? Yes; Fluid consistency: Thin  Diet effective now                  EDUCATION NEEDS:   No education needs have been identified at  this time  Skin:  Skin Assessment: Skin Integrity Issues: Skin Integrity Issues:: Stage II Stage II: sacrum  Last BM:  11/27 - type 6  Height:  Ht Readings from Last 1 Encounters:  10/31/22 5\' 6"  (1.676 m)   Weight:  Wt Readings from Last 1 Encounters:  10/31/22 57.5 kg   BMI:  Body mass index is 20.46 kg/m.  Estimated Nutritional Needs:   Kcal:  1700-1900  Protein:  85-100 grams  Fluid:  >/= 1.7 L    13/06/23 RD, LDN Clinical Dietitian See [facility] for contact information.

## 2022-11-21 NOTE — TOC Progression Note (Addendum)
Transition of Care [facility]) - Progression Note    Patient Details  Name: Jamie [name] MRN: [id number] Date of Birth: 1956-03-15  [facility] CM/SW Contact  Delilah Shan, LCSWA Phone Number: 11/21/2022, 12:39 PM  Clinical Narrative:     Update- CSW spoke with Whitney with [facility] who confirmed they will restart insurance authorization for patient. CSW informed MD.  CSW spoke with Whitney from [facility] who informed CSW patients insurance authorization was denied for SNF placement. CSW informed MD and CM. TOC will continue to follow and assist with patients dc planning needs.   Expected Discharge Plan: Home w Home Health Services Barriers to Discharge: Continued Medical Work up  Expected Discharge Plan and Services Expected Discharge Plan: Home w Home Health Services In-house Referral: Clinical Social Work     Living arrangements for the past 2 months: Single Family Home                                       Social Determinants of Health (SDOH) Interventions    Readmission Risk Interventions    11/01/2022   12:39 PM 10/04/2022    3:42 PM 07/28/2022    1:55 PM  Readmission Risk Prevention Plan  Transportation Screening Complete Complete Complete  Medication Review Oceanographer) Complete Complete Complete  PCP or Specialist appointment within 3-5 days of discharge   Not Complete  HRI or Home Care Consult  Complete Complete  SW Recovery Care/Counseling Consult Complete Complete Complete  Palliative Care Screening Not Applicable Not Applicable Not Applicable  Skilled Nursing Facility  Not Applicable Not Applicable

## 2022-11-21 NOTE — Progress Notes (Signed)
Physical Therapy Treatment Patient Details Name: Jamie [name] MRN: [id number] DOB: 06-09-1956 Today's Date: 11/21/2022   History of Present Illness Pt is a 66 y/o female who presents 10/31/2022 from home where she was found by EMS on her couch, covered in urine and feces, unable to get up. Pt called EMS due to SOB and abdominal pain. She was found to have a probable gastric ulcer. PMH significant for anxiety, gastroenteritis, ETOH abuse.    PT Comments    Patient resting in bed at start of session and agreeable to mobilize with encouragement however pt limited by nausea/discomfort. Pt able to ambulate short bout around room and attempted to complete repeat sit<>stands from EOB however only able to complete 3 reps due to discomfort. Pt stating "I feel like I may have diarrhea" however when offered the bathroom declined and stated "I don't need to". EOS pt resting back in bed. Continue to recommend SNF rehab.     Recommendations for follow up therapy are one component of a multi-disciplinary discharge planning process, led by the attending physician.  Recommendations may be updated based on patient status, additional functional criteria and insurance authorization.  Follow Up Recommendations  Skilled nursing-short term rehab (<3 hours/day) Can patient physically be transported by private vehicle: Yes   Assistance Recommended at Discharge Frequent or constant Supervision/Assistance  Patient can return home with the following A little help with walking and/or transfers;A little help with bathing/dressing/bathroom;Assistance with cooking/housework;Assist for transportation;Help with stairs or ramp for entrance   Equipment Recommendations  None recommended by PT    Recommendations for Other Services       Precautions / Restrictions Precautions Precautions: Fall Precaution Comments: Premedicate for pain; frequent stools Restrictions Weight Bearing Restrictions: No     Mobility  Bed  Mobility Overal bed mobility: Modified Independent Bed Mobility: Supine to Sit, Sit to Supine     Supine to sit: Modified independent (Device/Increase time), HOB elevated Sit to supine: HOB elevated, Modified independent (Device/Increase time)   General bed mobility comments: HOB elevated, extra time, and use of bed rails    Transfers Overall transfer level: Needs assistance Equipment used: Rolling walker (2 wheels) Transfers: Sit to/from Stand Sit to Stand: Min guard           General transfer comment: close guarding for sit<>stand from EOB, pt with flexed posture with rise and unable to fully stand upright.    Ambulation/Gait Ambulation/Gait assistance: Min guard, Min assist Gait Distance (Feet): 25 Feet Assistive device: Rolling walker (2 wheels) Gait Pattern/deviations: Decreased stride length, Trunk flexed, Decreased step length - right, Decreased step length - left, Decreased dorsiflexion - left, Decreased dorsiflexion - right, Shuffle Gait velocity: decreased     General Gait Details: pt with significant flexed posture throughout, cues for more upright standing with pt able to achieve momentarily in static stand but unable to maintain during gait. short bout in room to bench and back to EOB.   Stairs             Wheelchair Mobility    Modified Rankin (Stroke Patients Only)       Balance Overall balance assessment: Needs assistance Sitting-balance support: Feet supported Sitting balance-Leahy Scale: Good     Standing balance support: Bilateral upper extremity supported, During functional activity, Reliant on assistive device for balance Standing balance-Leahy Scale: Poor  Cognition Arousal/Alertness: Awake/alert Behavior During Therapy: WFL for tasks assessed/performed, Anxious Overall Cognitive Status: Within Functional Limits for tasks assessed                                           Exercises Other Exercises Other Exercises: attempted sit<>stand: 3x from EOB with UE use, cues to deter pt from dropping to bed and to control lowering.    General Comments        Pertinent Vitals/Pain Pain Assessment Pain Assessment: Faces Faces Pain Scale: Hurts even more Pain Location: generalized, all over Pain Descriptors / Indicators: Grimacing, Guarding, Moaning Pain Intervention(s): Limited activity within patient's tolerance, Monitored during session, Premedicated before session, Repositioned    Home Living                          Prior Function            PT Goals (current goals can now be found in the care plan section) Acute Rehab PT Goals Patient Stated Goal: Decrease pain PT Goal Formulation: With patient Time For Goal Achievement: 11/22/22 Potential to Achieve Goals: Good Progress towards PT goals: Progressing toward goals (slow self limiting)    Frequency    Min 2X/week      PT Plan Current plan remains appropriate    Co-evaluation              AM-PAC PT "6 Clicks" Mobility   Outcome Measure  Help needed turning from your back to your side while in a flat bed without using bedrails?: None Help needed moving from lying on your back to sitting on the side of a flat bed without using bedrails?: None Help needed moving to and from a bed to a chair (including a wheelchair)?: A Little Help needed standing up from a chair using your arms (e.g., wheelchair or bedside chair)?: A Little Help needed to walk in hospital room?: A Little Help needed climbing 3-5 steps with a railing? : A Lot 6 Click Score: 19    End of Session Equipment Utilized During Treatment: Gait belt Activity Tolerance: Patient limited by fatigue Patient left: in bed;with call bell/phone within reach;with bed alarm set Nurse Communication: Mobility status PT Visit Diagnosis: Unsteadiness on feet (R26.81);Pain;Difficulty in walking, not elsewhere classified  (R26.2);Muscle weakness (generalized) (M62.81)     Time: 1012-1029 PT Time Calculation (min) (ACUTE ONLY): 17 min  Charges:  $Therapeutic Exercise: 8-22 mins                     Verner Mould, DPT Acute Rehabilitation Services Office [phone]  11/21/22 11:24 AM

## 2022-11-22 DIAGNOSIS — Z7189 Other specified counseling: Secondary | ICD-10-CM | POA: Diagnosis not present

## 2022-11-22 DIAGNOSIS — K251 Acute gastric ulcer with perforation: Secondary | ICD-10-CM | POA: Diagnosis not present

## 2022-11-22 DIAGNOSIS — Z515 Encounter for palliative care: Secondary | ICD-10-CM | POA: Diagnosis not present

## 2022-11-22 DIAGNOSIS — R0781 Pleurodynia: Secondary | ICD-10-CM | POA: Diagnosis not present

## 2022-11-22 DIAGNOSIS — K8050 Calculus of bile duct without cholangitis or cholecystitis without obstruction: Secondary | ICD-10-CM | POA: Diagnosis not present

## 2022-11-22 DIAGNOSIS — Z66 Do not resuscitate: Secondary | ICD-10-CM | POA: Diagnosis not present

## 2022-11-22 DIAGNOSIS — K659 Peritonitis, unspecified: Secondary | ICD-10-CM | POA: Diagnosis not present

## 2022-11-22 DIAGNOSIS — R1084 Generalized abdominal pain: Secondary | ICD-10-CM

## 2022-11-22 LAB — COMPREHENSIVE METABOLIC PANEL
ALT: 11 U/L (ref 0–44)
AST: 15 U/L (ref 15–41)
Albumin: 2.5 g/dL — ABNORMAL LOW (ref 3.5–5.0)
Alkaline Phosphatase: 123 U/L (ref 38–126)
Anion gap: 9 (ref 5–15)
BUN: 5 mg/dL — ABNORMAL LOW (ref 8–23)
CO2: 25 mmol/L (ref 22–32)
Calcium: 9.3 mg/dL (ref 8.9–10.3)
Chloride: 104 mmol/L (ref 98–111)
Creatinine, Ser: 0.67 mg/dL (ref 0.44–1.00)
GFR, Estimated: >60 mL/min (ref >60)
Glucose, Bld: 101 mg/dL — ABNORMAL HIGH (ref 70–99)
Potassium: 3.6 mmol/L (ref 3.5–5.1)
Sodium: 138 mmol/L (ref 135–145)
Total Bilirubin: 0.6 mg/dL (ref 0.3–1.2)
Total Protein: 5.9 g/dL — ABNORMAL LOW (ref 6.5–8.1)

## 2022-11-22 LAB — CBC
HCT: 28.9 % — ABNORMAL LOW (ref 36.0–46.0)
Hemoglobin: 8.8 g/dL — ABNORMAL LOW (ref 12.0–15.0)
MCH: 24.2 pg — ABNORMAL LOW (ref 26.0–34.0)
MCHC: 30.4 g/dL (ref 30.0–36.0)
MCV: 79.6 fL — ABNORMAL LOW (ref 80.0–100.0)
Platelets: 447 10*3/uL — ABNORMAL HIGH (ref 150–400)
RBC: 3.63 MIL/uL — ABNORMAL LOW (ref 3.87–5.11)
RDW: 15.3 % (ref 11.5–15.5)
WBC: 9.5 10*3/uL (ref 4.0–10.5)
nRBC: 0 % (ref 0.0–0.2)

## 2022-11-22 MED ORDER — OXYCODONE HCL ER 10 MG PO T12A
20.0000 mg | EXTENDED_RELEASE_TABLET | Freq: Two times a day (BID) | ORAL | Status: DC
  Administered 2022-11-22 – 2022-11-24 (×4): 20 mg via ORAL
  Filled 2022-11-22 (×4): qty 2

## 2022-11-22 NOTE — Progress Notes (Addendum)
[facility]  PROGRESS NOTE  Jamie [name] UMP:[id number] DOB: 03/09/1956 DOA: 10/31/2022 PCP: Barbie Banner, MD   Brief HPI:   66 year old female with a history of chronic pain syndrome, anxiety, alcohol use in remission, chronic back pain, seizure presenting with worsening upper abdominal pain that began on 10/29/2022.  Notably, the patient was recently discharged from the hospital after a stay from 09/28/2029 to 10/04/2022 where she was treated for sepsis secondary to pneumonia.  The patient required vasopressors for short period of time during the hospitalization.  Her hospital stay was complicated by a seizure for which she was started on Keppra.  The patient was also noted to have symptomatic bradycardia which was thought to be due to her metabolic abnormalities.  This resolved with treatment of her acute medical issues.  The patient was discharged home with home health PT. Apparently, the patient had progressive weakness since 10/29/2022 to the point where she was unable to get off the couch.    In the ED, the patient bradycardic and hypotensive.  She was started on vasopressors.  WBC 29.7. CT of the abdomen and pelvis showed foci of free air in the upper abdomen with moderate thickening of the gastric cardia and distal gastric body.  There was also marked intrahepatic and extrahepatic ductal dilatation with a 4 mm round density in the distal CBD.  There was also pancreatic atrophy and pancreatic duct dilatation.  The patient was started on cefepime and metronidazole and IV fluids.     Subjective   Patient seen and examined,  still complains of right-sided abdominal pain.   Assessment/Plan:    Septic shock -Secondary to peritonitis and perforated viscus, resolved -Patient has been weaned off vasopressors and has remained stable without them -Lactic acid peaked to 2.1 -Patient was started on  cefepime and metronidazole, switched  to Augmentin -UA was negative for pyuria -Blood  cultures x2 showed no growth  Right chest wall pain -S/p fall last week -Rib x-rays unremarkable  Right upper quadrant pain -Repeat CBC and CMP today  Peritonitis -Secondary to microperforation in her stomach as seen on CT abdomen/pelvis -Patient says that she has been using BC powders on a daily basis for several years due to rheumatoid arthritis -General surgery felt that patient microperforation might have sealed, she was started on clear liquid diet -Repeat CT abdomen pelvis was done which did not show pneumoperitoneum; patient treated conservatively with antibiotics -General surgery recommended to treat total 14 days of antibiotics, patient has been on Augmentin since 11/09/2022 he received cefepime and Flagyl from 10/31/2022 to 11/09/2022 -Continue PPI twice daily and Carafate -Continue Augmentin for total 14 days; stop date 11/29  Biliary ductal dilatation -Noted on CT abdomen/pelvis -There was concern for possible choledocholithiasis -MRCP showed distal common bile duct measuring 12 mm with 2 small stones in the mid duct measuring up to 4 mm; sludge in the dilated gallbladder with pericholecystic fluid acute focal for acute cholecystitis -Gastroenterology was  consulted for possible ERCP; no plan for ERCP in the hospital -GI to follow-up as outpatient As per GI Continue twice daily PPI for at least 12 weeks Acute inpatient upper endoscopy deferred due to recent perforated GI ulcer Agree with advancing diet and monitoring for worsening of clinical symptoms; upper GI series does not show leak/perforation Checking H. pylori stool antigen Strictly avoid NSAIDs and alcohol ERCP needs to be considered as an outpatient for choledocholithiasis not currently causing obstruction, liver enzymes have normalized completely EGD will be performed  at the same time as possible ERCP to ensure ulcer healing.  Seizure disorder -Patient had unprovoked seizure during her last hospital  admission -Currently on Keppra   Normocytic anemia -Hemoglobin stable at 7.9, likely from dilution and due to chronic illness -We will continue to monitor hemoglobin intermittently  Bradycardia -Seems to have resolved -Continue monitoring on telemetry  Hypersomnolence -Patient has been very somnolent secondary to opioids -Opiates will be slowly tapered off as below -Hypersomnolence has improved -We will continue continuous pulse ox monitoring in the hospital   Opioid dependence/?  Pseudo addiction -Patient received oxycodone 10 mg, number 120 tablets on a monthly basis -Started oxycodone in the hospital -Currently getting oxycodone 20 mg every 3 hours as needed, and total getting 120 to 140 mg of oxycodone daily.  I explained to patient that we have to start cutting down slowly and we will change oxycodone to 20 mg every 4 hours as needed as patient is very drowsy.  Patient was very resistant to changing oxycodone dosing. -We will consult palliative medicine for pain management, might need different opioids as patient will be developing tolerance to oxycodone    Anxiety -Continue Xanax 1 mg 3 times daily as needed  Sacral 2 pressure ulcer -Present on admission -Continue local wound care  Low back pain -CT of the lumbar and thoracic spine are unremarkable  Disposition -Awaiting bed at skilled nursing facility   Medications     acetaminophen  1,000 mg Oral Q6H   Or   acetaminophen  650 mg Rectal Q6H   amoxicillin-clavulanate  1 tablet Oral Q12H   enoxaparin (LOVENOX) injection  40 mg Subcutaneous Q24H   levETIRAcetam  500 mg Oral BID   multivitamin with minerals  1 tablet Oral Daily   pantoprazole  40 mg Oral BID   psyllium  1 packet Oral Daily   saccharomyces boulardii  250 mg Oral BID   sucralfate  1 g Oral TID WC & HS   tiZANidine  4 mg Oral TID     Data Reviewed:   CBG:  No results for input(s): "GLUCAP" in the last 168 hours.  SpO2: 91 % O2 Flow Rate  (L/min): 2 L/min    Vitals:   11/21/22 0918 11/21/22 2008 11/22/22 0421 11/22/22 0812  BP: (!) 113/59 (!) 105/58 129/85 (!) 112/55  Pulse: 89 77 89 85  Resp: 17 18 18 16   Temp:  99 F (37.2 C) 97.9 F (36.6 C) 99.3 F (37.4 C)  TempSrc:  Oral Oral Oral  SpO2: 92% 92% 100% 91%  Weight:      Height:          Data Reviewed:  Basic Metabolic Panel: Recent Labs  Lab 11/17/22 0148  NA 135  K 3.9  CL 101  CO2 25  GLUCOSE 109*  BUN 15  CREATININE 0.71  CALCIUM 9.0    CBC: Recent Labs  Lab 11/17/22 0148  WBC 6.6  HGB 7.9*  HCT 25.0*  MCV 80.4  PLT 326    LFT Recent Labs  Lab 11/17/22 0148  AST 14*  ALT 9  ALKPHOS 110  BILITOT 0.2*  PROT 5.6*  ALBUMIN 2.4*     Antibiotics: Anti-infectives (From admission, onward)    Start     Dose/Rate Route Frequency Ordered Stop   11/09/22 1000  amoxicillin-clavulanate (AUGMENTIN) 875-125 MG per tablet 1 tablet        1 tablet Oral Every 12 hours 11/09/22 0847 11/23/22 2359   11/07/22 0800  metroNIDAZOLE (FLAGYL) IVPB 500 mg  Status:  Discontinued        500 mg 100 mL/hr over 60 Minutes Intravenous Every 12 hours 11/06/22 2001 11/09/22 0847   11/07/22 0300  ceFEPIme (MAXIPIME) 2 g in sodium chloride 0.9 % 100 mL IVPB  Status:  Discontinued        2 g 200 mL/hr over 30 Minutes Intravenous Every 8 hours 11/06/22 1948 11/09/22 0847   11/02/22 1000  ceFEPIme (MAXIPIME) 2 g in sodium chloride 0.9 % 100 mL IVPB  Status:  Discontinued        2 g 200 mL/hr over 30 Minutes Intravenous Every 8 hours 11/02/22 0754 11/06/22 1908   11/01/22 0600  metroNIDAZOLE (FLAGYL) IVPB 500 mg  Status:  Discontinued        500 mg 100 mL/hr over 60 Minutes Intravenous Every 12 hours 10/31/22 1747 11/06/22 1859   10/31/22 2200  ceFEPIme (MAXIPIME) 2 g in sodium chloride 0.9 % 100 mL IVPB  Status:  Discontinued        2 g 200 mL/hr over 30 Minutes Intravenous Every 12 hours 10/31/22 1757 11/02/22 0754   10/31/22 1600  metroNIDAZOLE (FLAGYL)  IVPB 500 mg        500 mg 100 mL/hr over 60 Minutes Intravenous  Once 10/31/22 1546 10/31/22 1759   10/31/22 1445  ceFEPIme (MAXIPIME) 2 g in sodium chloride 0.9 % 100 mL IVPB        2 g 200 mL/hr over 30 Minutes Intravenous  Once 10/31/22 1440 10/31/22 1643        DVT prophylaxis: Lovenox  Code Status: Full code  Family Communication: Discussed with patient's daughter on phone, will recheck CBC and CMP today to follow-up on the liver enzymes.   CONSULTS    Objective    Physical Examination:  General-appears in no acute distress Heart-S1-S2, regular, no murmur auscultated Lungs-clear to auscultation bilaterally, no wheezing or crackles auscultated Abdomen-soft, RUQ tenderness to palpation, no organomegaly Extremities-no edema in the lower extremities Psych-alert , oriented x3, intact insight and judgment  Status is: Inpatient:      Pressure Injury 10/31/22 Sacrum Medial Stage 2 -  Partial thickness loss of dermis presenting as a shallow open injury with a red, pink wound bed without slough. (Active)  10/31/22 2322  Location: Sacrum  Location Orientation: Medial  Staging: Stage 2 -  Partial thickness loss of dermis presenting as a shallow open injury with a red, pink wound bed without slough.  Wound Description (Comments):   Present on Admission: Yes        Reed Eifert S Hadas Jessop   [facility] If 7PM-7AM, please contact night-coverage at [email or web address], Office  [phone]   11/22/2022, 3:02 PM  LOS: 22 days

## 2022-11-22 NOTE — TOC Progression Note (Signed)
Transition of Care (TOC) - Progression Note   Patient discussed in QC meeting with [facility] Director Zack B and supervisor Kandace Blitz.  Patient Details  Name: Jamie [name] MRN: [id number] Date of Birth: March 14, 1956  Transition [facility]) CM/SW Contact  Justice Milliron, Adria Devon, RN Phone Number: 11/22/2022, 3:54 PM  Clinical Narrative:       Expected Discharge Plan: Home w Home Health Services Barriers to Discharge: Continued Medical Work up  Expected Discharge Plan and Services Expected Discharge Plan: Home w Home Health Services In-house Referral: Clinical Social Work     Living arrangements for the past 2 months: Single Family Home                                       Social Determinants of Health (SDOH) Interventions    Readmission Risk Interventions    11/01/2022   12:39 PM 10/04/2022    3:42 PM 07/28/2022    1:55 PM  Readmission Risk Prevention Plan  Transportation Screening Complete Complete Complete  Medication Review (RN Care Manager) Complete Complete Complete  PCP or Specialist appointment within 3-5 days of discharge   Not Complete  HRI or Home Care Consult  Complete Complete  SW Recovery Care/Counseling Consult Complete Complete Complete  Palliative Care Screening Not Applicable Not Applicable Not Applicable  Skilled Nursing Facility  Not Applicable Not Applicable

## 2022-11-22 NOTE — TOC Progression Note (Signed)
Transition of Care [facility]) - Progression Note    Patient Details  Name: Jamie [name] MRN: [id number] Date of Birth: 10-23-1956  Transition of Care [facility]) CM/SW Contact  Delilah Shan, LCSWA Phone Number: 11/22/2022, 12:35 PM  Clinical Narrative:     Insurance authorization currently pending. CSW will continue to follow and assist with patients dc planning needs.   Expected Discharge Plan: Home w Home Health Services Barriers to Discharge: Continued Medical Work up  Expected Discharge Plan and Services Expected Discharge Plan: Home w Home Health Services In-house Referral: Clinical Social Work     Living arrangements for the past 2 months: Single Family Home                                       Social Determinants of Health (SDOH) Interventions    Readmission Risk Interventions    11/01/2022   12:39 PM 10/04/2022    3:42 PM 07/28/2022    1:55 PM  Readmission Risk Prevention Plan  Transportation Screening Complete Complete Complete  Medication Review [employer]) Complete Complete Complete  PCP or Specialist appointment within 3-5 days of discharge   Not Complete  HRI or Home Care Consult  Complete Complete  SW Recovery Care/Counseling Consult Complete Complete Complete  Palliative Care Screening Not Applicable Not Applicable Not Applicable  Skilled Nursing Facility  Not Applicable Not Applicable

## 2022-11-22 NOTE — Consult Note (Cosign Needed Addendum)
Palliative Medicine Inpatient Consult Note  Consulting Provider: Oswald Hillock, MD   Reason for consult:   [name] Palliative Medicine Consult  Reason for Consult? Pain management, patient on high-dose of opioids.  Might need to patient off opioids due to side effects   11/22/2022  HPI:  Per intake H&P --> 66 year old female with a history of chronic pain syndrome, anxiety, alcohol use in remission, chronic back pain, seizure presenting with worsening upper abdominal pain that began on 10/29/2022 -identified to have septic shock due to peritonitis which has resolved.  Patient still having right upper abdominal pain.  The palliative medicine team has been consulted for ongoing symptom management and goals of care conversations.  Clinical Assessment/Goals of Care:  *Please note that this is a verbal dictation therefore any spelling or grammatical errors are due to the "[employer] One" system interpretation.  I have reviewed medical records including EPIC notes, labs and imaging, received report from bedside RN, assessed the patient.    I met with Jamie [name] to further discuss diagnosis prognosis, GOC, EOL wishes, disposition and options.   I introduced Palliative Medicine as specialized medical care for people living with serious illness. It focuses on providing relief from the symptoms and stress of a serious illness. The goal is to improve quality of life for both the patient and the family.  Medical History Review and Understanding:  Jamie [name] and I reviewed her past medical history of chronic pain syndrome which she has been using opioids since she was in her 51s.  Her pain management is managed through [facility] pain clinic.  In addition she has a history of seizures, arthritis, and alcohol abuse.  Social History:  Jamie [name] shares that she is from [place] however mostly grew up in the [place] area.  She expresses that she went to [place] for  college and later worked at [employer].  She had been married and had 1 daughter as well as 2 stepsons.  She expresses that she is a woman of faith.  Functional and Nutritional State:  Prior to hospitalization Jamie [name] was living independently.  She was able to perform all B ADLs and IADLs independently.  She would at times utilize a walker for support though this was not consistent.  Jamie [name] did have a fairly decent appetite at home though while hospitalized has little to no appetite.  Palliative Symptoms:  Acute on chronic pain most notable in the abdomen constant 10 out of 10 at its worst 8 out of 10 at its best.  The pain is sharp and constantly present it is bandlike across her abdomen moving from the left to the right side.  Patient ihas chronically used 10 mg of oxycodone in her home every 4 hours to manage pain in the past.  Given acuity the pain in her abdomen is worse than normal pain threshold allows for.  Patient endorses that she has experienced constipation though as of now does seem to be moving her bowels fairly well.  Jamie [name] shares that she does have nausea which is intermittent and relieved with antiemetics.  This also occurs in her home environment.  Jamie [name] has been sleeping fairly well overall when her pain is well addressed.  As far as appetite goes Jamie [name]'s appetite has been quite poor since hospitalization.  Jamie [name] struggles with generalized anxiety for which Xanax does help with.  Advance Directives:  A detailed discussion was had today regarding advanced directives.  Patient shares if unable to make  decisions for herself she rely upon her daughter Jamie [name] to be her primary Media planner.  Code Status:  Concepts specific to code status, artifical feeding and hydration, continued IV antibiotics and rehospitalization was had.  The difference between a aggressive medical intervention path  and a palliative comfort care path for this patient at this time was had.   Encouraged  patient/family to consider DNR/DNI status understanding evidenced based poor outcomes in similar hospitalized patient, as the cause of arrest is likely associated with advanced chronic/terminal illness rather than an easily reversible acute cardio-pulmonary event. I explained that DNR/DNI does not change the medical plan and it only comes into effect after a person has arrested (died).  It is a protective measure to keep [place] from harming the patient in their last moments of life.  Jamie [name] was agreeable to DNR/DNI with understanding that patient would not receive CPR, defibrillation, ACLS medications, or intubation.   Discussion:  Jamie [name] and I discussed in detail her home environment the acute on chronic disease processes she is encountered since her 22-day hospitalization.  We reviewed that she is quite weak from this hospital stay and will require likely rehabilitation.  Jamie [name] is agreeable to this and shares although she would like to go home she recognizes she does not have enough support in that environment.  Discussed that the case management team is sending increased to other skilled nursing facilities as the one she was most interested and denied her.  We discussed the patient's biliary duct dilation due to possible choledocholithiasis and peritonitis which was due to a microperforation.  Discussed the short-term and long-term expectations in the setting of these.  Reviewed patient's pain regiment she was quite resistant to rotating from oxycodone as she feels it works and shares that we need to do a deeper more formal assessment with imaging of her right upper quadrant pain.  I shared that I would point this out to the medical team though does appear that there had been imaging studies completed during her hospitalization as well as the GI team's involvement for possible ERCP.  Discussed if patient did not want to switch opioids at least adding a long-acting OxyContin to help with the peaks and valleys  of patient's pain.  She is agreeable to this at this time.  Reviewed the importance of her involvement to better help and support her symptoms as well as discussed the big picture.    Patient's goals are for recovery and return to independence.  Discussed the importance of continued conversation with family and their  medical providers regarding overall plan of care and treatment options, ensuring decisions are within the context of the patients values and GOCs.  Decision Maker: Jamie [name],Jamie [name] Daughter [phone]   SUMMARY OF RECOMMENDATIONS   DNAR/DNI  Symptom management as below  Patient's goals are for recovery and return to a level of independence  Patient is open to short-term stay at skilled nursing if need be --> it appears her prior insurance authorization has been denied  Palliative care will continue to support patient and aid in ongoing symptom management  Code Status/Advance Care Planning: DNAR/DNI   Symptom Management:  Acute on chronic pain: -Start OxyContin 20 mg twice daily -Continue Oxycodone 20 mg every 4 hours as needed -Continue Zanaflex 4 mg 3 times daily -At this time to there does not appear to be a neuropathic component of pain therefore we will hold off on starting gabapentin however this would be a reasonable consideration in the future if  needed based upon the character of pain -Upon discharge patient plans to see her pain specialist - [name] (per PMP aware has prescribed oxycodone regularly)  Anxiety: -Continue Xanax 1 mg 3 times daily as needed  Adult failure to thrive: -Appreciate dietary involvement  Constipation: -Continue daily packet of psyllium  Nausea: - Continue zofran as needed, will hold off on prescribing ATC given prior QTC prolongation  Palliative Prophylaxis:  Aspiration, Bowel Regimen, Delirium Protocol, Frequent Pain Assessment, Oral Care, Palliative Wound Care, and Turn Reposition  Additional Recommendations (Limitations,  Scope, Preferences): Continue care current care  Psycho-social/Spiritual:  Desire for further Chaplaincy support: Not presently Additional Recommendations: Pain management education   Prognosis: Multiple chronic comorbid conditions, recurrent inpatient hospitalizations, overall frailty-increased 45-monthmortality risk  Discharge Planning: Discharge to skilled nursing setting versus home with home health once medically optimized  Vitals:   11/22/22 0421 11/22/22 0812  BP: 129/85 (!) 112/55  Pulse: 89 85  Resp: 18 16  Temp: 97.9 F (36.6 C) 99.3 F (37.4 C)  SpO2: 100% 91%    Intake/Output Summary (Last 24 hours) at 11/22/2022 1628 Last data filed at 11/22/2022 0600 Gross per 24 hour  Intake 240 ml  Output --  Net 240 ml   Last Weight  Most recent update: 10/31/2022 10:04 PM    Weight  57.5 kg (126 lb 12.2 oz)            Gen: Elderly Caucasian female in moderate distress HEENT: moist mucous membranes CV: Regular rate and rhythm  PULM: On room air breathing is even and nonlabored ABD: Tender right upper quadrant EXT: No edema Neuro: Alert and oriented x3  PPS:  60%   This conversation/these recommendations were discussed with patient primary care team, Dr. LDarrick Meigs Billing based on MDM: High  Problems Addressed: One acute or chronic illness or injury that poses a threat to life or bodily function  Amount and/or Complexity of Data: Category 3:Discussion of management or test interpretation with external physician/other qualified health care professional/appropriate source (not separately reported)  Risks: Decision regarding hospitalization or escalation of hospital care and Decision not to resuscitate or to de-escalate care because of poor prognosis ______________________________________________________ [place] Cell Phone: [phone] utilize secure chat with additional questions, if there is no response  within 30 minutes please call the above phone number  Palliative Medicine Team providers are available by phone from 7am to 7pm daily and can be reached through the team cell phone.  Should this patient require assistance outside of these hours, please call the patient's attending physician.

## 2022-11-22 NOTE — Progress Notes (Addendum)
Mobility Specialist Progress Note   11/22/22 1808  Mobility  Activity Refused mobility   Pt deferring mobility stating to be having extreme sharp RUQ pain despite getting medication <78mins ago and letting it be known that they will not move until they receive more. Relayed message to RN.  Frederico Hamman Mobility Specialist Please contact via SecureChat or  Rehab office at [phone]

## 2022-11-23 ENCOUNTER — Inpatient Hospital Stay (HOSPITAL_COMMUNITY): Payer: Medicare HMO

## 2022-11-23 DIAGNOSIS — K659 Peritonitis, unspecified: Secondary | ICD-10-CM | POA: Diagnosis not present

## 2022-11-23 DIAGNOSIS — Z66 Do not resuscitate: Secondary | ICD-10-CM | POA: Diagnosis not present

## 2022-11-23 DIAGNOSIS — Z515 Encounter for palliative care: Secondary | ICD-10-CM | POA: Diagnosis not present

## 2022-11-23 DIAGNOSIS — R1011 Right upper quadrant pain: Secondary | ICD-10-CM | POA: Diagnosis not present

## 2022-11-23 DIAGNOSIS — R52 Pain, unspecified: Secondary | ICD-10-CM

## 2022-11-23 LAB — CBC WITH DIFFERENTIAL/PLATELET
Abs Immature Granulocytes: 0.04 10*3/uL (ref 0.00–0.07)
Basophils Absolute: 0.1 10*3/uL (ref 0.0–0.1)
Basophils Relative: 1 %
Eosinophils Absolute: 0.3 10*3/uL (ref 0.0–0.5)
Eosinophils Relative: 3 %
HCT: 27.1 % — ABNORMAL LOW (ref 36.0–46.0)
Hemoglobin: 7.9 g/dL — ABNORMAL LOW (ref 12.0–15.0)
Immature Granulocytes: 0 %
Lymphocytes Relative: 16 %
Lymphs Abs: 1.5 10*3/uL (ref 0.7–4.0)
MCH: 23.8 pg — ABNORMAL LOW (ref 26.0–34.0)
MCHC: 29.2 g/dL — ABNORMAL LOW (ref 30.0–36.0)
MCV: 81.6 fL (ref 80.0–100.0)
Monocytes Absolute: 1.4 10*3/uL — ABNORMAL HIGH (ref 0.1–1.0)
Monocytes Relative: 15 %
Neutro Abs: 6 10*3/uL (ref 1.7–7.7)
Neutrophils Relative %: 65 %
Platelets: 421 10*3/uL — ABNORMAL HIGH (ref 150–400)
RBC: 3.32 MIL/uL — ABNORMAL LOW (ref 3.87–5.11)
RDW: 15.5 % (ref 11.5–15.5)
WBC: 9.3 10*3/uL (ref 4.0–10.5)
nRBC: 0 % (ref 0.0–0.2)

## 2022-11-23 LAB — HEPATIC FUNCTION PANEL
ALT: 9 U/L (ref 0–44)
AST: 12 U/L — ABNORMAL LOW (ref 15–41)
Albumin: 2.3 g/dL — ABNORMAL LOW (ref 3.5–5.0)
Alkaline Phosphatase: 102 U/L (ref 38–126)
Bilirubin, Direct: <0.1 mg/dL (ref 0.0–0.2)
Total Bilirubin: 0.2 mg/dL — ABNORMAL LOW (ref 0.3–1.2)
Total Protein: 5.5 g/dL — ABNORMAL LOW (ref 6.5–8.1)

## 2022-11-23 MED ORDER — SENNA 8.6 MG PO TABS
1.0000 | ORAL_TABLET | Freq: Every day | ORAL | Status: DC
  Administered 2022-11-23: 8.6 mg via ORAL
  Filled 2022-11-23: qty 1

## 2022-11-23 MED ORDER — IOHEXOL 350 MG/ML SOLN
75.0000 mL | Freq: Once | INTRAVENOUS | Status: AC | PRN
  Administered 2022-11-23: 75 mL via INTRAVENOUS

## 2022-11-23 NOTE — Progress Notes (Signed)
Nutrition Brief Note   RD received a consult for assessment of nutrition requirements/status. RD has been following pt since admission. Please see most recent follow-up note from, November 27th. Pt continues to decline RD supplements and snacks. RD will continue to follow.   Kirby Crigler RD, LDN Clinical Dietitian See Loretha Stapler for contact information.

## 2022-11-23 NOTE — TOC Progression Note (Addendum)
Transition of Care [facility]) - Progression Note    Patient Details  Name: DERRIANA OSER MRN: [id number] Date of Birth: 1956-06-22  Transition of Care [facility]) CM/SW Contact  Delilah Shan, LCSWA Phone Number: 11/23/2022, 10:28 AM  Clinical Narrative:     Update-10:33am- Whitney with [facility] informed CSW she checked with her case manager who checked to make sure patients insurance received clinicals and that her CM confirmed that patients insurance received clinicals for review for patient. Whitney provided patients pending insurance authorization # [id number]. CSW will continue to follow. CSW spoke with Alphonzo Lemmings with [facility] who informed CSW insurance authorization for patient is pending. Whitney confirmed she will contact patients insurance to make sure they received clinicals for patient. Whitney informed CSW will call CSW back on status. CSW will continue to follow and assist with patients dc planning needs.  Expected Discharge Plan: Home w Home Health Services Barriers to Discharge: Continued Medical Work up  Expected Discharge Plan and Services Expected Discharge Plan: Home w Home Health Services In-house Referral: Clinical Social Work     Living arrangements for the past 2 months: Single Family Home                                       Social Determinants of Health (SDOH) Interventions    Readmission Risk Interventions    11/01/2022   12:39 PM 10/04/2022    3:42 PM 07/28/2022    1:55 PM  Readmission Risk Prevention Plan  Transportation Screening Complete Complete Complete  Medication Review Oceanographer) Complete Complete Complete  PCP or Specialist appointment within 3-5 days of discharge   Not Complete  HRI or Home Care Consult  Complete Complete  SW Recovery Care/Counseling Consult Complete Complete Complete  Palliative Care Screening Not Applicable Not Applicable Not Applicable  Skilled Nursing Facility  Not Applicable Not Applicable

## 2022-11-23 NOTE — Progress Notes (Signed)
[facility] CONSULT PROGRESS NOTE    Progress Note  Jamie [name]  GGY:[id number] DOB: Feb 28, 1956 DOA: 10/31/2022 PCP: Barbie Banner, MD     Brief Narrative:   Jamie [name] is an 66 y.o. female past medical history of anxiety alcohol abuse in remission, chronic back pain, history of seizure disorder and discharged from the hospital on October 04, 2022 she was treated for sepsis secondary to pneumonia treated with pressors hospital stay was complicated by seizures comes in again for right upper quadrant abdominal pain that began on 10/29/2022 in the ED CT was done that showed free air in the abdomen, was found to be shock in the perforated viscus and peritonitis, surgery was consulted.  CT also showed intrahepatic and extrahepatic duct dilation with a CBD stone and pancreatic atrophy with pancreatic duct dilation she was started empirically on cefepime and Flagyl.  Now on augmentin.  Clinically stable to be discharged to skilled nursing facility   Assessment/Plan:   Septic shock secondary to peritonitis due to perforated peptic ulcer in the setting of BC powder use: Sepsis now resolved. Off pressors, completed 14-day course of antibiotics. We will not increase IV narcotics. Awaiting insurance of approval for discharge.  Acute perforated peptic ulcer disease peritonitis (HCC) Conservative management for now, has completed 14-day course of antibiotics.  Mild fever, complaining of New right upper quadrant pain: She has been getting Tylenol regularly. Her pain is reproducible by palpation along the right sternal rib. Check a CBC with differential check hepatic function panel.  Choledocholithiasis biliary extraperitoneal hepatic ductal dilation: MRCP showed ductal dilation to small stone measuring 4 mm with mildly dilated gallbladder and pericholecystic fluid to go to go for acute cholecystitis. GI will follow-up as an outpatient.  Pancreatic ductal dilation: Her CT was done that  showed similar appearing pancreatic duct in 2017 unchanged from previous.  Acute on chronic blood loss anemia No signs of overt bleeding. Continue PPI twice a day and Carafate. Hemoglobin has remained stable .  History of seizure disorder: Unprovoked change Keppra to oral.  Sinus bradycardia: Has resolved.  Opiate dependence: Continue current regimen.  Anxiety: Continue Xanax 3 times a day seems to be stable.  Hypokalemia: Replete improved.  Low back pain after fall: CT scan of lumbar spine and thoracic spine have been ordered.  Narcotic seeking behavior: Noted.  Sacral cubitus ulcer stage II present on admission RN Pressure Injury Documentation: Pressure Injury 10/31/22 Sacrum Medial Stage 2 -  Partial thickness loss of dermis presenting as a shallow open injury with a red, pink wound bed without slough. (Active)  10/31/22 2322  Location: Sacrum  Location Orientation: Medial  Staging: Stage 2 -  Partial thickness loss of dermis presenting as a shallow open injury with a red, pink wound bed without slough.  Wound Description (Comments):   Present on Admission: Yes  Dressing Type Foam - Lift dressing to assess site every shift 11/21/22 2150    DVT prophylaxis: lovenox Family Communication:non4 Status is: Inpatient Remains inpatient appropriate because: Acute perforated peptic ulcer disease with peritonitis    Code Status:     Code Status Orders  (From admission, onward)           Start     Ordered   10/31/22 1734  Full code  Continuous        10/31/22 1747           Code Status History     Date Active Date Inactive Code Status Order  ID Comments User Context   09/29/2022 0431 10/04/2022 2211 Full Code [id number]  Faythe Ghee ED   07/27/2022 2304 07/30/2022 2000 Full Code [id number]  Lilyan Gilford, DO Inpatient   07/18/2022 2114 07/23/2022 0012 Full Code [id number]  Shon Hale, MD ED   09/21/2016 0441 09/21/2016 1627 Full Code  [id number]  Devoria Albe, MD ED   05/08/2016 2307 05/10/2016 1521 Full Code [id number]  Opyd, Lavone Neri, MD ED         IV Access:   Peripheral IV   Procedures and diagnostic studies:   No results found.   Medical Consultants:   None.   Subjective:    Jamie [name] now hurting in the right upper quadrant  Objective:    Vitals:   11/22/22 1717 11/22/22 2214 11/22/22 2330 11/23/22 0333  BP: (!) 102/55 133/72  98/70  Pulse: 84 84  67  Resp: 16   17  Temp: 98.5 F (36.9 C) (!) 101 F (38.3 C) 99.8 F (37.7 C) 98.6 F (37 C)  TempSrc: Oral Oral Oral Oral  SpO2: (!) 86% 94%  95%  Weight:      Height:       SpO2: 95 % O2 Flow Rate (L/min): 2 L/min   Intake/Output Summary (Last 24 hours) at 11/23/2022 0749 Last data filed at 11/22/2022 2200 Gross per 24 hour  Intake 240 ml  Output --  Net 240 ml    Filed Weights   10/31/22 1738 10/31/22 2200  Weight: 59.9 kg 57.5 kg    Exam: General exam: In no acute distress. Respiratory system: Good air movement and clear to auscultation. Cardiovascular system: S1 & S2 heard, RRR. No JVD. Gastrointestinal system: Positive bowel sounds, soft, tenderness around the right upper quadrant, no rebound or guarding Extremities: No pedal edema. Skin: No rashes, lesions or ulcers   Data Reviewed:    Labs: Basic Metabolic Panel: Recent Labs  Lab 11/17/22 0148 11/22/22 1544  NA 135 138  K 3.9 3.6  CL 101 104  CO2 25 25  GLUCOSE 109* 101*  BUN 15 5*  CREATININE 0.71 0.67  CALCIUM 9.0 9.3    GFR Estimated Creatinine Clearance: 61.1 mL/min (by C-G formula based on SCr of 0.67 mg/dL). Liver Function Tests: Recent Labs  Lab 11/17/22 0148 11/22/22 1544  AST 14* 15  ALT 9 11  ALKPHOS 110 123  BILITOT 0.2* 0.6  PROT 5.6* 5.9*  ALBUMIN 2.4* 2.5*    No results for input(s): "LIPASE", "AMYLASE" in the last 168 hours. No results for input(s): "AMMONIA" in the last 168 hours. Coagulation profile No results for  input(s): "INR", "PROTIME" in the last 168 hours. COVID-19 Labs  No results for input(s): "DDIMER", "FERRITIN", "LDH", "CRP" in the last 72 hours.  Lab Results  Component Value Date   SARSCOV2NAA NEGATIVE 05/04/2022   SARSCOV2NAA NEGATIVE 05/19/2021    CBC: Recent Labs  Lab 11/17/22 0148 11/22/22 1544  WBC 6.6 9.5  HGB 7.9* 8.8*  HCT 25.0* 28.9*  MCV 80.4 79.6*  PLT 326 447*    Cardiac Enzymes: No results for input(s): "CKTOTAL", "CKMB", "CKMBINDEX", "TROPONINI" in the last 168 hours. BNP (last 3 results) No results for input(s): "PROBNP" in the last 8760 hours. CBG: No results for input(s): "GLUCAP" in the last 168 hours. D-Dimer: No results for input(s): "DDIMER" in the last 72 hours. Hgb A1c: No results for input(s): "HGBA1C" in the last 72 hours. Lipid Profile: No results for input(s): "CHOL", "HDL", "LDLCALC", "  TRIG", "CHOLHDL", "LDLDIRECT" in the last 72 hours. Thyroid function studies: No results for input(s): "TSH", "T4TOTAL", "T3FREE", "THYROIDAB" in the last 72 hours.  Invalid input(s): "FREET3" Anemia work up: No results for input(s): "VITAMINB12", "FOLATE", "FERRITIN", "TIBC", "IRON", "RETICCTPCT" in the last 72 hours. Sepsis Labs: Recent Labs  Lab 11/17/22 0148 11/22/22 1544  WBC 6.6 9.5    Microbiology No results found for this or any previous visit (from the past 240 hour(s)).    Medications:    acetaminophen  1,000 mg Oral Q6H   Or   acetaminophen  650 mg Rectal Q6H   enoxaparin (LOVENOX) injection  40 mg Subcutaneous Q24H   levETIRAcetam  500 mg Oral BID   multivitamin with minerals  1 tablet Oral Daily   oxyCODONE  20 mg Oral Q12H   pantoprazole  40 mg Oral BID   psyllium  1 packet Oral Daily   saccharomyces boulardii  250 mg Oral BID   sucralfate  1 g Oral TID WC & HS   tiZANidine  4 mg Oral TID   Continuous Infusions:      LOS: 23 days   Marinda Elk  [facility]  11/23/2022, 7:49 AM

## 2022-11-23 NOTE — Progress Notes (Signed)
Daily Progress Note   Patient Name: Jamie [name]       Date: 11/23/2022 DOB: 03/06/1956  Age: 66 y.o. MRN#: [id number] Attending Physician: Marinda Elk, MD Primary Care Physician: Barbie Banner, MD Admit Date: 10/31/2022  Reason for Consultation/Follow-up: Establishing goals of care  Subjective: Reports ongoing abdominal pain across her upper abdomen.  Length of Stay: 23  Current Medications: Scheduled Meds:   acetaminophen  650 mg Rectal Q6H   enoxaparin (LOVENOX) injection  40 mg Subcutaneous Q24H   levETIRAcetam  500 mg Oral BID   multivitamin with minerals  1 tablet Oral Daily   oxyCODONE  20 mg Oral Q12H   pantoprazole  40 mg Oral BID   psyllium  1 packet Oral Daily   saccharomyces boulardii  250 mg Oral BID   sucralfate  1 g Oral TID WC & HS   tiZANidine  4 mg Oral TID    Continuous Infusions:   PRN Meds: ALPRAZolam, hydrALAZINE, loperamide, ondansetron **OR** ondansetron (ZOFRAN) IV, mouth rinse, oxyCODONE  Physical Exam Constitutional:      General: She is not in acute distress.    Appearance: She is ill-appearing.     Comments: Sitting up in bed taking medication from nurse  Pulmonary:     Effort: Pulmonary effort is normal.  Skin:    General: Skin is warm and dry.  Neurological:     Mental Status: She is alert and oriented to person, place, and time.             Vital Signs: BP (!) 91/42   Pulse 74   Temp 97.6 F (36.4 C) (Oral)   Resp 16   Ht 5\' 6"  (1.676 m)   Wt 57.5 kg   SpO2 100%   BMI 20.46 kg/m  SpO2: SpO2: 100 % O2 Device: O2 Device: Nasal Cannula O2 Flow Rate: O2 Flow Rate (L/min): 2 L/min  Intake/output summary:  Intake/Output Summary (Last 24 hours) at 11/23/2022 1320 Last data filed at 11/22/2022 2200 Gross per 24 hour  Intake 240 ml   Output --  Net 240 ml   LBM: Last BM Date : 11/21/22 Baseline Weight: Weight: 59.9 kg Most recent weight: Weight: 57.5 kg       Palliative Assessment/Data: PPS 60%      Patient Active Problem List   Diagnosis Date Noted   Acute gastric ulcer with perforation (HCC) 11/07/2022   Choledocholithiasis 11/07/2022   Pressure injury of skin 11/03/2022   Peritonitis (HCC) 10/31/2022   Symptomatic bradycardia 09/29/2022   Malnutrition of moderate degree 09/29/2022   Hypotension    Physical deconditioning 07/29/2022   Shock circulatory (HCC) 07/29/2022   Chronic pain 07/28/2022   UTI (urinary tract infection) 07/28/2022   Chronic diarrhea 07/28/2022   Lactic acidosis 07/28/2022   Sinus bradycardia 07/27/2022   Seizure (HCC) 07/18/2022   Generalized weakness 05/04/2022   Hypoglycemia 05/04/2022   Hypokalemia 04/06/2022   Falls frequently 04/06/2022   Alcohol abuse 04/06/2022   Opiate dependence --Chronic pain 04/06/2022   Benzodiazepine dependence /Chronic anxiety 04/06/2022   Abdominal pain    Nausea vomiting and diarrhea 05/08/2016   Sepsis, unspecified organism (HCC) 05/08/2016   Migraine 05/08/2016  AKI (acute kidney injury) (HCC) 05/08/2016   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  Patient still having right upper abdominal pain.  The palliative medicine team has been consulted for ongoing symptom management and goals of care conversations.   Assessment: We discussed her pain - discussed adjustment to pain regimen made yesterday. Education provided. Patient would like to  continue on current regimen for now. Had BM 11/27. She is hopeful for discharge soon.   Recommendations/Plan: Patient would like to continue current regimen: oxy IR 20 mg q4, oxycontin 20 mg q12, zanaflex TID Patient not taking psyllium Will add senna qhs  Code Status: DNR  Care plan was discussed with patient and RN  Thank you for allowing the Palliative Medicine Team to assist in the care of this patient.  *Please note that this is a verbal dictation therefore any spelling or grammatical errors are due to the "Dragon Medical One" system interpretation.  Gerlean Ren, DNP, [facility] Palliative Medicine Team Team Phone # [phone]  Pager [phone]

## 2022-11-24 DIAGNOSIS — Z66 Do not resuscitate: Secondary | ICD-10-CM | POA: Diagnosis not present

## 2022-11-24 DIAGNOSIS — Z7189 Other specified counseling: Secondary | ICD-10-CM | POA: Diagnosis not present

## 2022-11-24 DIAGNOSIS — Z515 Encounter for palliative care: Secondary | ICD-10-CM | POA: Diagnosis not present

## 2022-11-24 DIAGNOSIS — K659 Peritonitis, unspecified: Secondary | ICD-10-CM | POA: Diagnosis not present

## 2022-11-24 LAB — CBC WITH DIFFERENTIAL/PLATELET
Abs Immature Granulocytes: 0.07 10*3/uL (ref 0.00–0.07)
Basophils Absolute: 0.1 10*3/uL (ref 0.0–0.1)
Basophils Relative: 1 %
Eosinophils Absolute: 0.2 10*3/uL (ref 0.0–0.5)
Eosinophils Relative: 1 %
HCT: 30.7 % — ABNORMAL LOW (ref 36.0–46.0)
Hemoglobin: 9 g/dL — ABNORMAL LOW (ref 12.0–15.0)
Immature Granulocytes: 1 %
Lymphocytes Relative: 17 %
Lymphs Abs: 1.8 10*3/uL (ref 0.7–4.0)
MCH: 23.5 pg — ABNORMAL LOW (ref 26.0–34.0)
MCHC: 29.3 g/dL — ABNORMAL LOW (ref 30.0–36.0)
MCV: 80.2 fL (ref 80.0–100.0)
Monocytes Absolute: 1.3 10*3/uL — ABNORMAL HIGH (ref 0.1–1.0)
Monocytes Relative: 12 %
Neutro Abs: 7.4 10*3/uL (ref 1.7–7.7)
Neutrophils Relative %: 68 %
Platelets: 497 10*3/uL — ABNORMAL HIGH (ref 150–400)
RBC: 3.83 MIL/uL — ABNORMAL LOW (ref 3.87–5.11)
RDW: 15.5 % (ref 11.5–15.5)
WBC: 10.8 10*3/uL — ABNORMAL HIGH (ref 4.0–10.5)
nRBC: 0 % (ref 0.0–0.2)

## 2022-11-24 LAB — BASIC METABOLIC PANEL
Anion gap: 11 (ref 5–15)
BUN: 7 mg/dL — ABNORMAL LOW (ref 8–23)
CO2: 21 mmol/L — ABNORMAL LOW (ref 22–32)
Calcium: 9.4 mg/dL (ref 8.9–10.3)
Chloride: 103 mmol/L (ref 98–111)
Creatinine, Ser: 0.76 mg/dL (ref 0.44–1.00)
GFR, Estimated: >60 mL/min (ref >60)
Glucose, Bld: 99 mg/dL (ref 70–99)
Potassium: 3.3 mmol/L — ABNORMAL LOW (ref 3.5–5.1)
Sodium: 135 mmol/L (ref 135–145)

## 2022-11-24 MED ORDER — POLYETHYLENE GLYCOL 3350 17 G PO PACK
17.0000 g | PACK | Freq: Two times a day (BID) | ORAL | Status: AC
  Administered 2022-11-24 – 2022-11-25 (×4): 17 g via ORAL
  Filled 2022-11-24 (×4): qty 1

## 2022-11-24 MED ORDER — SODIUM CHLORIDE 0.9 % IV SOLN: INTRAVENOUS | Status: DC

## 2022-11-24 MED ORDER — SORBITOL 70 % SOLN
30.0000 mL | Freq: Once | Status: AC
  Administered 2022-11-24: 30 mL via ORAL
  Filled 2022-11-24: qty 30

## 2022-11-24 MED ORDER — ACETAMINOPHEN 325 MG PO TABS
650.0000 mg | ORAL_TABLET | Freq: Four times a day (QID) | ORAL | Status: DC | PRN
  Administered 2022-11-24 – 2022-11-28 (×7): 650 mg via ORAL
  Filled 2022-11-24 (×7): qty 2

## 2022-11-24 MED ORDER — SENNA 8.6 MG PO TABS
2.0000 | ORAL_TABLET | Freq: Every day | ORAL | Status: DC
  Administered 2022-11-24 – 2022-11-27 (×3): 17.2 mg via ORAL
  Filled 2022-11-24 (×4): qty 2

## 2022-11-24 NOTE — Progress Notes (Signed)
Drucie Opitz received for [facility] and they are prepared to admit pt today pending medical clearance. MD updated.   Dellie Burns, MSW, LCSW [phone] (coverage)

## 2022-11-24 NOTE — Progress Notes (Signed)
Daily Progress Note   Patient Name: Jamie [name]       Date: 11/24/2022 DOB: 1956/12/06  Age: 66 y.o. MRN#: [id number] Attending Physician: David Stall, Darin Engels, MD Primary Care Physician: Barbie Banner, MD Admit Date: 10/31/2022  Reason for Consultation/Follow-up: Establishing goals of care  Subjective: Tells me current pain regimen in helpful - would like to continue Abdominal pain continues  Length of Stay: 24  Current Medications: Scheduled Meds:   acetaminophen  650 mg Rectal Q6H   enoxaparin (LOVENOX) injection  40 mg Subcutaneous Q24H   levETIRAcetam  500 mg Oral BID   multivitamin with minerals  1 tablet Oral Daily   oxyCODONE  20 mg Oral Q12H   pantoprazole  40 mg Oral BID   polyethylene glycol  17 g Oral BID   psyllium  1 packet Oral Daily   saccharomyces boulardii  250 mg Oral BID   senna  2 tablet Oral QHS   sucralfate  1 g Oral TID WC & HS   tiZANidine  4 mg Oral TID    Continuous Infusions:   PRN Meds: ALPRAZolam, hydrALAZINE, loperamide, ondansetron **OR** ondansetron (ZOFRAN) IV, mouth rinse, oxyCODONE  Physical Exam Constitutional:      General: She is not in acute distress.    Appearance: She is ill-appearing.     Comments: Sitting up in bed taking medication from nurse Sleeping but wakes easily to voice  Pulmonary:     Effort: Pulmonary effort is normal.  Skin:    General: Skin is warm and dry.  Neurological:     Mental Status: She is alert and oriented to person, place, and time.             Vital Signs: BP (!) 99/55 (BP Location: Left Arm)   Pulse 77   Temp 99.2 F (37.3 C) (Oral)   Resp 16   Ht 5\' 6"  (1.676 m)   Wt 57.5 kg   SpO2 90%   BMI 20.46 kg/m  SpO2: SpO2: 90 % O2 Device: O2 Device: Room Air O2 Flow Rate: O2 Flow Rate (L/min): 2  L/min  Intake/output summary: No intake or output data in the 24 hours ending 11/24/22 1246  LBM: Last BM Date : 11/21/22 Baseline Weight: Weight: 59.9 kg Most recent weight: Weight: 57.5 kg       Palliative Assessment/Data: PPS 60%      Patient Active Problem List   Diagnosis Date Noted   Acute gastric ulcer with perforation (HCC) 11/07/2022   Choledocholithiasis 11/07/2022   Pressure injury of skin 11/03/2022   Peritonitis (HCC) 10/31/2022   Symptomatic bradycardia 09/29/2022   Malnutrition of moderate degree 09/29/2022   Hypotension    Physical deconditioning 07/29/2022   Shock circulatory (HCC) 07/29/2022   Chronic pain 07/28/2022   UTI (urinary tract infection) 07/28/2022   Chronic diarrhea 07/28/2022   Lactic acidosis 07/28/2022   Sinus bradycardia 07/27/2022   Seizure (HCC) 07/18/2022   Generalized weakness 05/04/2022   Hypoglycemia 05/04/2022   Hypokalemia 04/06/2022   Falls frequently 04/06/2022   Alcohol abuse 04/06/2022   Opiate dependence --Chronic pain 04/06/2022   Benzodiazepine dependence /Chronic anxiety 04/06/2022   Abdominal pain    Nausea  vomiting and diarrhea 05/08/2016   Sepsis, unspecified organism (HCC) 05/08/2016   Migraine 05/08/2016   AKI (acute kidney injury) (HCC) 05/08/2016   Relative polycythemia 05/08/2016   Anxiety 05/08/2016   Sepsis due to undetermined organism (HCC) 05/08/2016    Palliative Care Assessment & Plan   HPI: Per intake H&P --> 66 year old female with a history of chronic pain syndrome, anxiety, alcohol use in remission, chronic back pain, seizure presenting with worsening upper abdominal pain that began on 10/29/2022 -identified to have septic shock due to peritonitis which has resolved.  Patient still having right upper abdominal pain.  The palliative medicine team has been consulted for ongoing symptom management and goals of care conversations.   Assessment: We discussed her pain - discussed adjustment to pain  regimen made 11/28. Education provided. Patient would like to continue on current regimen for now. Had BM 11/27. She is hopeful for discharge to rehab soon. She tells me it is normal for her to go for a while without BM. We discussed importance of regular BMs esp on high doses of opioids. Will increase senna.   Recommendations/Plan: Patient would like to continue current regimen: oxy IR 20 mg q4, oxycontin 20 mg q12, zanaflex TID Patient not taking psyllium Will increase senna qhs  Code Status: DNR  Care plan was discussed with patient and RN  Thank you for allowing the Palliative Medicine Team to assist in the care of this patient.  *Please note that this is a verbal dictation therefore any spelling or grammatical errors are due to the "Dragon Medical One" system interpretation.  Gerlean Ren, DNP, [facility] Palliative Medicine Team Team Phone # [phone]  Pager [phone]

## 2022-11-24 NOTE — Progress Notes (Signed)
Mobility Specialist - Progress Note   11/24/22 1000  Mobility  Activity Dangled on edge of bed  Level of Assistance Standby assist, set-up cues, supervision of patient - no hands on  Assistive Device None  Distance Ambulated (ft) 0 ft  Activity Response Tolerated fair  $Mobility charge 1 Mobility    Pt received in bed agreeable to mobility after encouragement. Extra time required to scoot to EOB d/t c/o pain. Deferred OOB mobility once seated EOB. Left in bed w/ breakfast tray within reach and all needs met.    [place] Specialist Please contact via SecureChat or Rehab office at [phone]

## 2022-11-24 NOTE — Progress Notes (Signed)
Physical Therapy Treatment Patient Details Name: Jamie [name] MRN: [id number] DOB: [birth year] Today's Date: 11/24/2022   History of Present Illness Pt is a 66 y/o female who presents 10/31/2022 from home where she was found by EMS on her couch, covered in urine and feces, unable to get up. Pt called EMS due to SOB and abdominal pain. She was found to have a probable gastric ulcer. PMH significant for anxiety, gastroenteritis, ETOH abuse.    PT Comments    Pt received in supine, agreeable to therapy session with max encouragement, pt requesting pain meds although appearing lethargic and noted to be in soiled bed. Pt needing increased assist for bed mobility and transfers this date and able to tolerate standing ~5 minutes total during peri-care and pivotal transfer to chair. Pt too lethargic and tearful with pain while pivoting so unable to progress gait or stair instruction this date. Pt hypoxic in chair post-exertion, RN/MD notified and RN placed pt on 3L O2 [place], sats improved to 94%. Pt remains appropriate for short term low intensity post-acute rehab. Pt continues to benefit from PT services to progress toward functional mobility goals.    Recommendations for follow up therapy are one component of a multi-disciplinary discharge planning process, led by the attending physician.  Recommendations may be updated based on patient status, additional functional criteria and insurance authorization.  Follow Up Recommendations  Skilled nursing-short term rehab (<3 hours/day) Can patient physically be transported by private vehicle: Yes (pending progress; would need diapers)   Assistance Recommended at Discharge Frequent or constant Supervision/Assistance  Patient can return home with the following A little help with walking and/or transfers;A little help with bathing/dressing/bathroom;Assistance with cooking/housework;Assist for transportation;Help with stairs or ramp for entrance   Equipment  Recommendations  None recommended by PT    Recommendations for Other Services       Precautions / Restrictions Precautions Precautions: Fall Precaution Comments: Premedicate for pain; frequent stools Restrictions Weight Bearing Restrictions: No     Mobility  Bed Mobility Overal bed mobility: Needs Assistance Bed Mobility: Rolling, Sidelying to Sit Rolling: Supervision Sidelying to sit: Min assist       General bed mobility comments: HOB elevated, extra time, and use of bed rails, pt needing minA to advance hips to foot flat and for trunk stability as she had difficulty sitting fully upright due to lethargy.    Transfers Overall transfer level: Needs assistance Equipment used: Rolling walker (2 wheels) Transfers: Sit to/from Stand Sit to Stand: Min assist, Mod assist   Step pivot transfers: Mod assist       General transfer comment: modA to stand from EOB>RW, pt and bed noted to be soiled upon standing and pt with flexed posture with rise and unable to fully stand upright, pt stood ~3 minutes for peri-care at RW and then was able to stand a couple more minutes while pivoting to chair from EOB with max encouragement. Pt tearful while pivoting due to increased pain and wheezing, SpO2 assessed and reading 87% on RA at rest. RN/MD notified.    Ambulation/Gait               General Gait Details: pt unable due to pain/lethargy   Stairs             Wheelchair Mobility    Modified Rankin (Stroke Patients Only)       Balance Overall balance assessment: Needs assistance Sitting-balance support: Feet supported Sitting balance-Leahy Scale: Poor Sitting balance - Comments: LOB to  her R side multiple times due to fatigue/lethargy and pt guarding RLQ due to pain; pt needing min guard to minA seated balance intermittently   Standing balance support: Bilateral upper extremity supported, During functional activity, Reliant on assistive device for balance Standing  balance-Leahy Scale: Poor                              Cognition Arousal/Alertness: Lethargic, Suspect due to medications Behavior During Therapy: Anxious Overall Cognitive Status: No family/caregiver present to determine baseline cognitive functioning                                 General Comments: pt lethargic, keeping eyes closed, calling out for pain meds and tearful with pivotal steps from bed>chair, RN called to room during session and arrived at end of session along with MD who came to assess her. Pt seeming to have difficulty accurately assessing her own pain as she reports pain is "10" once up in recliner but had stated that pain was better in chair than while standing/ambulating, therefore used PAINAD scale.        Exercises Other Exercises Other Exercises: pt unable to follow cues due to lethargy and high pain score    General Comments General comments (skin integrity, edema, etc.): SpO2 86-87% on RA after pivot transfer from bed>chair and pt audibly wheezing. RN notified. SpO2 improved to 94% after RN placed pt on 3L O2 [place] and HR 80 bpm.      Pertinent Vitals/Pain Pain Assessment Pain Assessment: PAINAD Breathing: occasional labored breathing, short period of hyperventilation Negative Vocalization: repeated troubled calling out, loud moaning/groaning, crying Facial Expression: facial grimacing Body Language: relaxed Consolability: distracted or reassured by voice/touch PAINAD Score: 6 Pain Location: generalized, RLQ Pain Descriptors / Indicators: Grimacing, Guarding, Moaning, Crying Pain Intervention(s): Limited activity within patient's tolerance, Monitored during session, Repositioned, Patient requesting pain meds-RN notified, Utilized relaxation techniques    Home Living                          Prior Function            PT Goals (current goals can now be found in the care plan section) Acute Rehab PT Goals Patient Stated  Goal: Decrease pain PT Goal Formulation: With patient Time For Goal Achievement:  (due for update, PT notified) Progress towards PT goals: Progressing toward goals (slow progress due to pain)    Frequency    Min 2X/week      PT Plan Current plan remains appropriate    Co-evaluation              AM-PAC PT "6 Clicks" Mobility   Outcome Measure  Help needed turning from your back to your side while in a flat bed without using bedrails?: A Little Help needed moving from lying on your back to sitting on the side of a flat bed without using bedrails?: A Little Help needed moving to and from a bed to a chair (including a wheelchair)?: A Little Help needed standing up from a chair using your arms (e.g., wheelchair or bedside chair)?: A Lot Help needed to walk in hospital room?: Total Help needed climbing 3-5 steps with a railing? : Total 6 Click Score: 13    End of Session Equipment Utilized During Treatment: Gait belt;Other (comment);Oxygen (placed on O2 end  of session) Activity Tolerance: Patient limited by lethargy;Patient limited by pain Patient left: with call bell/phone within reach;in chair;with chair alarm set;with nursing/sitter in room;Other (comment) (MD in room to evaluate pt) Nurse Communication: Mobility status;Patient requests pain meds;Other (comment) (hypoxia) PT Visit Diagnosis: Unsteadiness on feet (R26.81);Pain;Difficulty in walking, not elsewhere classified (R26.2);Muscle weakness (generalized) (M62.81)     Time: 5638-7564 PT Time Calculation (min) (ACUTE ONLY): 29 min  Charges:  $Therapeutic Activity: 23-37 mins                     Shavonda Wiedman P., PTA Acute Rehabilitation Services Secure Chat Preferred 9a-5:30pm Office: [phone]    Dorathy Kinsman [facility] 11/24/2022, 5:32 PM

## 2022-11-24 NOTE — Plan of Care (Signed)

## 2022-11-24 NOTE — Progress Notes (Signed)
[facility] CONSULT PROGRESS NOTE    Progress Note  Jamie [name]  QMV:[id number] DOB: September 27, 1956 DOA: 10/31/2022 PCP: Barbie Banner, MD     Brief Narrative:   Jamie [name] is an 66 y.o. female past medical history of anxiety alcohol abuse in remission, chronic back pain, history of seizure disorder and discharged from the hospital on October 04, 2022 she was treated for sepsis secondary to pneumonia treated with pressors hospital stay was complicated by seizures comes in again for right upper quadrant abdominal pain that began on 10/29/2022 in the ED CT was done that showed free air in the abdomen, was found to be shock in the perforated viscus and peritonitis, surgery was consulted.  CT also showed intrahepatic and extrahepatic duct dilation with a CBD stone and pancreatic atrophy with pancreatic duct dilation she was started empirically on cefepime and Flagyl.  Now on augmentin.  Clinically stable to be discharged to skilled nursing facility   Assessment/Plan:   Septic shock secondary to peritonitis due to perforated peptic ulcer in the setting of BC powder use: Sepsis now resolved. Off pressors, completed 14-day course of antibiotics. We will not increase IV narcotics. Physical therapy evaluated the patient, she will need to go to skilled nursing facility Awaiting insurance of approval for discharge.  Acute perforated peptic ulcer disease peritonitis (HCC) Conservative management for now, has completed 14-day course of antibiotics.  Mild fever, complaining of New right upper quadrant pain: She has been getting Tylenol regularly. Tmax 99.6, white count continues to improve. CT of the abdomen and pelvis showed slight reduction in dilation of common bile duct in comparison to previous exam right-sided pleural effusion and significant stool burden. LFTs are down. Question if pain is musculoskeletal.  Choledocholithiasis biliary extraperitoneal hepatic ductal dilation: MRCP  showed ductal dilation to small stone measuring 4 mm with mildly dilated gallbladder and pericholecystic fluid to go to go for acute cholecystitis. GI will follow-up as an outpatient.  Pancreatic ductal dilation: Her CT was done that showed similar appearing pancreatic duct in 2017 unchanged from previous. Repeated CT scan Show decreased dilation of common bile duct persistent density in the disc and common bile duct.  Acute on chronic blood loss anemia No signs of overt bleeding. Continue PPI twice a day and Carafate. Hemoglobin has remained stable .  History of seizure disorder: Unprovoked change Keppra to oral.  Sinus bradycardia: Has resolved.  Opiate dependence: Continue current regimen.  Anxiety: Continue Xanax 3 times a day seems to be stable.  Hypokalemia: Replete improved.  Low back pain after fall: CT scan of lumbar spine and thoracic spine have been ordered.  Narcotic seeking behavior: Noted.  Sacral cubitus ulcer stage II present on admission RN Pressure Injury Documentation: Pressure Injury 10/31/22 Sacrum Medial Stage 2 -  Partial thickness loss of dermis presenting as a shallow open injury with a red, pink wound bed without slough. (Active)  10/31/22 2322  Location: Sacrum  Location Orientation: Medial  Staging: Stage 2 -  Partial thickness loss of dermis presenting as a shallow open injury with a red, pink wound bed without slough.  Wound Description (Comments):   Present on Admission: Yes  Dressing Type Foam - Lift dressing to assess site every shift 11/23/22 2300    DVT prophylaxis: lovenox Family Communication:non4 Status is: Inpatient Remains inpatient appropriate because: Acute perforated peptic ulcer disease with peritonitis    Code Status:     Code Status Orders  (From admission, onward)  Start     Ordered   10/31/22 1734  Full code  Continuous        10/31/22 1747           Code Status History     Date Active  Date Inactive Code Status Order ID Comments User Context   09/29/2022 0431 10/04/2022 2211 Full Code [id number]  Tim Lair, PA-C ED   07/27/2022 2304 07/30/2022 2000 Full Code [id number]  Lilyan Gilford, DO Inpatient   07/18/2022 2114 07/23/2022 0012 Full Code [id number]  Shon Hale, MD ED   09/21/2016 0441 09/21/2016 1627 Full Code [id number]  Devoria Albe, MD ED   05/08/2016 2307 05/10/2016 1521 Full Code [id number]  Briscoe Deutscher, MD ED         IV Access:   Peripheral IV   Procedures and diagnostic studies:   CT ABDOMEN PELVIS W WO CONTRAST  Result Date: 11/24/2022 CLINICAL DATA:  Biliary obstruction EXAM: CT ABDOMEN AND PELVIS WITHOUT AND WITH CONTRAST TECHNIQUE: Multidetector CT imaging of the abdomen and pelvis was performed following the standard protocol before and following the bolus administration of intravenous contrast. RADIATION DOSE REDUCTION: This exam was performed according to the departmental dose-optimization program which includes automated exposure control, adjustment of the mA and/or kV according to patient size and/or use of iterative reconstruction technique. CONTRAST:  75mL OMNIPAQUE IOHEXOL 350 MG/ML SOLN COMPARISON:  CT 11/09/2022 FINDINGS: Lower chest: Large RIGHT pleural effusion increased from comparison exam. [name] atelectasis the RIGHT lower lobe Hepatobiliary: Common bile duct measures 6 mm decreased from 9 mm on prior. There is persistent soft tissue density in the distal common bile duct (image 43/8). No intrahepatic biliary duct dilatation. Gallbladder normal. Pancreas: Pancreas is normal. No ductal dilatation. No pancreatic inflammation. Spleen: Normal spleen Adrenals/urinary tract: Adrenal glands and kidneys are normal. The ureters and bladder normal. Stomach/Bowel: The stomach, duodenum, and small bowel normal. Moderate volume stool throughout the colon. Obstructing lesion. Vascular/Lymphatic: Abdominal aorta is normal caliber with atherosclerotic  calcification. There is no retroperitoneal or periportal lymphadenopathy. No pelvic lymphadenopathy. Reproductive: Post hysterectomy.  Adnexa unremarkable Other: No free fluid. Gas in the ventral subcutaneous abdominal wall related to subcu injections. Musculoskeletal: Posterior lumbar fusion.  No complication. IMPRESSION: 1. Dilatation of the common bile duct is slightly reduced from comparison exam. Persistent density in the distal common bile duct. No intrahepatic biliary duct dilatation. 2. Increasing RIGHT pleural effusion with [name] RIGHT lobe atelectasis. 3. No bowel obstruction. 4. Moderate volume stool throughout colon. Electronically Signed   By: Genevive Bi M.D.   On: 11/24/2022 08:16     Medical Consultants:   None.   Subjective:    Jamie [name] no complaints this morning just woke up when I went into the room.  Objective:    Vitals:   11/23/22 0926 11/23/22 1641 11/23/22 2042 11/24/22 0516  BP: (!) 91/42 (!) 111/54 (!) 96/44 (!) 94/57  Pulse: 74 (!) 108 74 66  Resp:  16 19   Temp: 97.6 F (36.4 C) 98.2 F (36.8 C) 99.1 F (37.3 C) 97.7 F (36.5 C)  TempSrc: Oral Oral Oral Oral  SpO2: 100% 99% 97% 97%  Weight:      Height:       SpO2: 97 % O2 Flow Rate (L/min): 2 L/min  No intake or output data in the 24 hours ending 11/24/22 0841  Filed Weights   10/31/22 1738 10/31/22 2200  Weight: 59.9 kg 57.5 kg    Exam: General  exam: In no acute distress. Respiratory system: Good air movement and clear to auscultation. Cardiovascular system: S1 & S2 heard, RRR. No JVD. Gastrointestinal system: Positive bowel sounds, soft, tenderness around the right upper quadrant, no rebound or guarding Extremities: No pedal edema. Skin: No rashes, lesions or ulcers   Data Reviewed:    Labs: Basic Metabolic Panel: Recent Labs  Lab 11/22/22 1544  NA 138  K 3.6  CL 104  CO2 25  GLUCOSE 101*  BUN 5*  CREATININE 0.67  CALCIUM 9.3    GFR Estimated Creatinine  Clearance: 61.1 mL/min (by C-G formula based on SCr of 0.67 mg/dL). Liver Function Tests: Recent Labs  Lab 11/22/22 1544 11/23/22 0903  AST 15 12*  ALT 11 9  ALKPHOS 123 102  BILITOT 0.6 0.2*  PROT 5.9* 5.5*  ALBUMIN 2.5* 2.3*    No results for input(s): "LIPASE", "AMYLASE" in the last 168 hours. No results for input(s): "AMMONIA" in the last 168 hours. Coagulation profile No results for input(s): "INR", "PROTIME" in the last 168 hours. COVID-19 Labs  No results for input(s): "DDIMER", "FERRITIN", "LDH", "CRP" in the last 72 hours.  Lab Results  Component Value Date   SARSCOV2NAA NEGATIVE 05/04/2022   SARSCOV2NAA NEGATIVE 05/19/2021    CBC: Recent Labs  Lab 11/22/22 1544 11/23/22 0903  WBC 9.5 9.3  NEUTROABS  --  6.0  HGB 8.8* 7.9*  HCT 28.9* 27.1*  MCV 79.6* 81.6  PLT 447* 421*    Cardiac Enzymes: No results for input(s): "CKTOTAL", "CKMB", "CKMBINDEX", "TROPONINI" in the last 168 hours. BNP (last 3 results) No results for input(s): "PROBNP" in the last 8760 hours. CBG: No results for input(s): "GLUCAP" in the last 168 hours. D-Dimer: No results for input(s): "DDIMER" in the last 72 hours. Hgb A1c: No results for input(s): "HGBA1C" in the last 72 hours. Lipid Profile: No results for input(s): "CHOL", "HDL", "LDLCALC", "TRIG", "CHOLHDL", "LDLDIRECT" in the last 72 hours. Thyroid function studies: No results for input(s): "TSH", "T4TOTAL", "T3FREE", "THYROIDAB" in the last 72 hours.  Invalid input(s): "FREET3" Anemia work up: No results for input(s): "VITAMINB12", "FOLATE", "FERRITIN", "TIBC", "IRON", "RETICCTPCT" in the last 72 hours. Sepsis Labs: Recent Labs  Lab 11/22/22 1544 11/23/22 0903  WBC 9.5 9.3    Microbiology No results found for this or any previous visit (from the past 240 hour(s)).    Medications:    acetaminophen  650 mg Rectal Q6H   enoxaparin (LOVENOX) injection  40 mg Subcutaneous Q24H   levETIRAcetam  500 mg Oral BID    multivitamin with minerals  1 tablet Oral Daily   oxyCODONE  20 mg Oral Q12H   pantoprazole  40 mg Oral BID   psyllium  1 packet Oral Daily   saccharomyces boulardii  250 mg Oral BID   senna  1 tablet Oral QHS   sucralfate  1 g Oral TID WC & HS   tiZANidine  4 mg Oral TID   Continuous Infusions:      LOS: 24 days   Marinda Elk  [facility]  11/24/2022, 8:41 AM

## 2022-11-25 ENCOUNTER — Inpatient Hospital Stay (HOSPITAL_COMMUNITY): Payer: Medicare HMO

## 2022-11-25 DIAGNOSIS — K659 Peritonitis, unspecified: Secondary | ICD-10-CM | POA: Diagnosis not present

## 2022-11-25 DIAGNOSIS — Z7189 Other specified counseling: Secondary | ICD-10-CM | POA: Diagnosis not present

## 2022-11-25 DIAGNOSIS — Z66 Do not resuscitate: Secondary | ICD-10-CM | POA: Diagnosis not present

## 2022-11-25 DIAGNOSIS — Z515 Encounter for palliative care: Secondary | ICD-10-CM | POA: Diagnosis not present

## 2022-11-25 LAB — URINALYSIS, ROUTINE W REFLEX MICROSCOPIC
Bilirubin Urine: NEGATIVE
Glucose, UA: NEGATIVE mg/dL
Ketones, ur: 5 mg/dL — AB
Nitrite: NEGATIVE
Protein, ur: 30 mg/dL — AB
Specific Gravity, Urine: 1.02 (ref 1.005–1.030)
pH: 5 (ref 5.0–8.0)

## 2022-11-25 LAB — COMPREHENSIVE METABOLIC PANEL
ALT: 7 U/L (ref 0–44)
AST: 13 U/L — ABNORMAL LOW (ref 15–41)
Albumin: 2.6 g/dL — ABNORMAL LOW (ref 3.5–5.0)
Alkaline Phosphatase: 104 U/L (ref 38–126)
Anion gap: 10 (ref 5–15)
BUN: 8 mg/dL (ref 8–23)
CO2: 22 mmol/L (ref 22–32)
Calcium: 9.4 mg/dL (ref 8.9–10.3)
Chloride: 103 mmol/L (ref 98–111)
Creatinine, Ser: 0.75 mg/dL (ref 0.44–1.00)
GFR, Estimated: >60 mL/min (ref >60)
Glucose, Bld: 93 mg/dL (ref 70–99)
Potassium: 3.3 mmol/L — ABNORMAL LOW (ref 3.5–5.1)
Sodium: 135 mmol/L (ref 135–145)
Total Bilirubin: 0.2 mg/dL — ABNORMAL LOW (ref 0.3–1.2)
Total Protein: 6.5 g/dL (ref 6.5–8.1)

## 2022-11-25 LAB — CBC WITH DIFFERENTIAL/PLATELET
Abs Immature Granulocytes: 0.04 10*3/uL (ref 0.00–0.07)
Basophils Absolute: 0 10*3/uL (ref 0.0–0.1)
Basophils Relative: 0 %
Eosinophils Absolute: 0.2 10*3/uL (ref 0.0–0.5)
Eosinophils Relative: 2 %
HCT: 25.2 % — ABNORMAL LOW (ref 36.0–46.0)
Hemoglobin: 7.7 g/dL — ABNORMAL LOW (ref 12.0–15.0)
Immature Granulocytes: 1 %
Lymphocytes Relative: 11 %
Lymphs Abs: 1 10*3/uL (ref 0.7–4.0)
MCH: 23.9 pg — ABNORMAL LOW (ref 26.0–34.0)
MCHC: 30.6 g/dL (ref 30.0–36.0)
MCV: 78.3 fL — ABNORMAL LOW (ref 80.0–100.0)
Monocytes Absolute: 1.1 10*3/uL — ABNORMAL HIGH (ref 0.1–1.0)
Monocytes Relative: 13 %
Neutro Abs: 6.2 10*3/uL (ref 1.7–7.7)
Neutrophils Relative %: 73 %
Platelets: 392 10*3/uL (ref 150–400)
RBC: 3.22 MIL/uL — ABNORMAL LOW (ref 3.87–5.11)
RDW: 15.5 % (ref 11.5–15.5)
WBC: 8.5 10*3/uL (ref 4.0–10.5)
nRBC: 0 % (ref 0.0–0.2)

## 2022-11-25 LAB — LACTIC ACID, PLASMA: Lactic Acid, Venous: 0.9 mmol/L (ref 0.5–1.9)

## 2022-11-25 MED ORDER — TRAZODONE HCL 100 MG PO TABS
100.0000 mg | ORAL_TABLET | Freq: Every evening | ORAL | Status: DC | PRN
  Administered 2022-11-27: 100 mg via ORAL
  Filled 2022-11-25: qty 1

## 2022-11-25 MED ORDER — SODIUM CHLORIDE 0.9 % IV SOLN: 1.0000 g | INTRAVENOUS | Status: DC

## 2022-11-25 MED ORDER — LEVETIRACETAM 500 MG PO TABS
500.0000 mg | ORAL_TABLET | Freq: Two times a day (BID) | ORAL | Status: DC
  Administered 2022-11-25 – 2022-11-28 (×7): 500 mg via ORAL
  Filled 2022-11-25 (×7): qty 1

## 2022-11-25 MED ORDER — LACTATED RINGERS IV BOLUS
500.0000 mL | Freq: Once | INTRAVENOUS | Status: AC
  Administered 2022-11-25: 500 mL via INTRAVENOUS

## 2022-11-25 MED ORDER — PANTOPRAZOLE SODIUM 40 MG PO TBEC
40.0000 mg | DELAYED_RELEASE_TABLET | Freq: Every day | ORAL | Status: DC
  Administered 2022-11-25 – 2022-11-26 (×2): 40 mg via ORAL
  Filled 2022-11-25 (×2): qty 1

## 2022-11-25 MED ORDER — VANCOMYCIN HCL 1250 MG/250ML IV SOLN
1250.0000 mg | INTRAVENOUS | Status: DC
  Administered 2022-11-25 – 2022-11-26 (×2): 1250 mg via INTRAVENOUS
  Filled 2022-11-25 (×4): qty 250

## 2022-11-25 MED ORDER — POTASSIUM CHLORIDE CRYS ER 20 MEQ PO TBCR
40.0000 meq | EXTENDED_RELEASE_TABLET | Freq: Two times a day (BID) | ORAL | Status: AC
  Administered 2022-11-25 (×2): 40 meq via ORAL
  Filled 2022-11-25 (×2): qty 2

## 2022-11-25 MED ORDER — SODIUM CHLORIDE 0.9 % IV SOLN
2.0000 g | INTRAVENOUS | Status: DC
  Administered 2022-11-25 – 2022-11-26 (×2): 2 g via INTRAVENOUS
  Filled 2022-11-25 (×2): qty 20

## 2022-11-25 MED ORDER — TIZANIDINE HCL 2 MG PO TABS
4.0000 mg | ORAL_TABLET | Freq: Three times a day (TID) | ORAL | Status: DC
  Administered 2022-11-25 – 2022-11-28 (×10): 4 mg via ORAL
  Filled 2022-11-25 (×10): qty 2

## 2022-11-25 NOTE — Progress Notes (Signed)
   11/24/22 2031  Vitals  Temp (!) 102.7 F (39.3 C)  Temp Source Oral  BP 109/69  MAP (mmHg) 80  BP Location Right Arm  BP Method Automatic  Patient Position (if appropriate) Sitting  Pulse Rate 88  Pulse Rate Source Dinamap  Resp 18  MEWS COLOR  MEWS Score Color Yellow  Oxygen Therapy  SpO2 94 %  O2 Device Nasal Cannula  O2 Flow Rate (L/min) 2 L/min  Pain Assessment  Pain Scale 0-10  Pain Score Asleep  MEWS Score  MEWS Temp 2  MEWS Systolic 0  MEWS Pulse 0  MEWS RR 0  MEWS LOC 0  MEWS Score 2  Provider Notification  Provider Name/Title Anthoney Harada, NP  Date Provider Notified 11/24/22  Time Provider Notified 2047  Method of Notification Page (secure chat)  Notification Reason Other (Comment) (elevated temperature)  Provider response Other (Comment) (Provider aware)

## 2022-11-25 NOTE — Progress Notes (Signed)
Per MD, pt not medically stable for dc today. Monia Pouch auth received and is valid through 12/6 per [place] with [employer] central intake. MD updated. SW will follow.   Dellie Burns, MSW, LCSW [phone] (coverage)

## 2022-11-25 NOTE — Progress Notes (Signed)
Palliative Medicine Inpatient Follow Up Note HPI: 66 year old female with a history of chronic pain syndrome, anxiety, alcohol use in remission, chronic back pain, seizure presenting with worsening upper abdominal pain that began on 10/29/2022 -identified to have septic shock due to peritonitis which has resolved.  Patient still having right upper abdominal pain.  The palliative medicine team has been consulted for ongoing symptom management and goals of care conversations.   Today's Discussion 11/25/2022  *Please note that this is a verbal dictation therefore any spelling or grammatical errors are due to the "[place]" system interpretation.  Chart reviewed inclusive of vital signs, progress notes, laboratory results, and diagnostic images.   I met with Jamie [name] at bedside this morning. She was resting in her bed and pointed to her RUQ. She shares that she continues to have acute band like pain there. We reviewed the events from overnight inclusive of her fever. She and I discussed her new right sided pleural effusion and how this can contribute to her discomfort. In addition discussed her identified prior biliary ductal dilation.   At this time patients oxycontin has been held due to hypotension. She endorses not desiring to restart this at the present time. She shares that she would like to continue her short acting oxycodone and zanaflex.   [name] has little appetite which she shares happens from time to time in the setting of her IBS. She states that she right now has very little appetite. She has had soft stools.   Created space and opportunity for patient to explore thoughts feelings and fears regarding current medical situation.She is hopeful that having a thoracentesis today will help her pain overall. We reviewed that if not the medical team would likely explore other reasons for ongoing pain.   Questions and concerns addressed/Palliative Support Provided.   Objective  Assessment: Vital Signs Vitals:   11/25/22 0529 11/25/22 0803  BP: (!) 107/54 (!) 115/59  Pulse: 86 83  Resp: 19 16  Temp: 100.3 F (37.9 C) 98.2 F (36.8 C)  SpO2: 95% 96%    Intake/Output Summary (Last 24 hours) at 11/25/2022 3545 Last data filed at 11/25/2022 0400 Gross per 24 hour  Intake 578.29 ml  Output --  Net 578.29 ml   Last Weight  Most recent update: 10/31/2022 10:04 PM    Weight  57.5 kg (126 lb 12.2 oz)            Gen: Elderly Caucasian female in moderate distress HEENT: moist mucous membranes CV: Regular rate and rhythm  PULM: On 2LPM [place],  breathing is even and nonlabored ABD: Tender right upper quadrant EXT: No edema Neuro: Alert and oriented x3  SUMMARY OF RECOMMENDATIONS   DNAR/DNI   Symptom management as below  Sepsis management per primary team   Patient's goals are for recovery and return to a level of independence   Patient is open to short-term stay at skilled nursing - Has now been approved for [place] care will continue to support patient and aid in ongoing symptom management  Symptom Management:  Acute on chronic pain: -OxyContin discontinued d/t sepsis and soft pressures -Continue Oxycodone 20 mg every 4 hours as needed -Continue Zanaflex 4 mg 3 times daily -Upon discharge patient plans to see her pain specialist - [name] (per PMP aware has prescribed oxycodone regularly)   Anxiety: -Continue Xanax 1 mg 3 times daily as needed   Adult failure to thrive: -Appreciate dietary involvement as is eating  0-25% of meals   Constipation: -Continue senna Two Tabs QHS   Nausea: - Continue zofran as needed  Billing based on MDM: High ______________________________________________________________________________________ [facility] Team Team Cell Phone: [phone] Please utilize secure chat with additional questions, if there is no response within 30 minutes please call the  above phone number  Palliative Medicine Team providers are available by phone from 7am to 7pm daily and can be reached through the team cell phone.  Should this patient require assistance outside of these hours, please call the patient's attending physician.

## 2022-11-25 NOTE — Progress Notes (Addendum)
       CROSS COVER NOTE  NAME: MAELANI YARBRO MRN: [id number] DOB : Oct 30, 1956    Date of Service   11/25/2022   HPI/Events of Note   Notified by bedside RN of soft BP 86/51 despite being on fluids. RN also reports WBC 10.8 earlier this evening with recent temp 102.7 F.  patient has been lethargic but arousable during daytime.  she is now requiring 2 L nasal cannula as she desat in the 80s. Per RN, lung sounds are clear clear in bilateral upper lobes and diminished in bilateral lower lobes.  No abdominal changes noted.  Patient recently completed Augmentin on 11/28 for acute perforated PUD peritonitis.  Most recently mild fevers and new right upper quadrant pain have been noted. 11/29 CT abdomen  and pelvis showed slight reduction in dilation of common bile duct.   500 cc IV bolus ordered. CBC, CMP, lactic acid UA, EKG, chest x-ray  0200-   BP stable, patient remains on 2 L O2 while sleeping.  Chest x-ray showing large right side pleural effusion.  IV fluids stopped.  Pending labs.  Interventions/ Plan          Anthoney Harada, DNP, [facility] [place]

## 2022-11-25 NOTE — Progress Notes (Signed)
[facility] CONSULT PROGRESS NOTE    Progress Note  Jamie [name]  [id number] DOB: 05-Nov-1956 DOA: 10/31/2022 PCP: Christain Sacramento, MD     Brief Narrative:   Jamie [name] is an 66 y.o. female past medical history of anxiety alcohol abuse in remission, chronic back pain, history of seizure disorder and discharged from the hospital on October 04, 2022 she was treated for sepsis secondary to pneumonia treated with pressors hospital stay was complicated by seizures comes in again for right upper quadrant abdominal pain that began on 10/29/2022 in the ED CT was done that showed free air in the abdomen, was found to be shock in the perforated viscus and peritonitis, surgery was consulted.  CT also showed intrahepatic and extrahepatic duct dilation with a CBD stone and pancreatic atrophy with pancreatic duct dilation she was started empirically on cefepime and Flagyl.  She has completed her course of antibiotics in house.    New right-sided pleural effusion with fever and leukocytosis started empirically on antibiotics    Assessment/Plan:   Septic shock secondary to peritonitis due to perforated peptic ulcer in the setting of BC powder use: Sepsis now resolved. Off pressors, completed 14-day course of antibiotics. Physical therapy evaluated the patient, she will need to go to skilled nursing facility Awaiting insurance of approval for discharge.  Acute perforated peptic ulcer disease peritonitis ([place]) Conservative management for now, has completed 14-day course of antibiotics.  Mild fever, complaining of New right upper quadrant pain/with new right-sided pleural effusion: Tmax of 102.7.  Chest x-ray showed right-sided pleural effusion. CT of the abdomen and pelvis showed slight reduction in dilation of common bile duct in comparison to previous exam right-sided pleural effusion and significant stool burden. Will consult IR to perform thoracocentesis. Started on vancomycin and  Rocephin.  Lactic acid 0.9. CBC with differential is pending. Check blood cultures.  Choledocholithiasis biliary extraperitoneal hepatic ductal dilation: MRCP showed ductal dilation to small stone measuring 4 mm with mildly dilated gallbladder and pericholecystic fluid to go to go for acute cholecystitis. GI will follow-up as an outpatient.  Pancreatic ductal dilation: Her CT was done that showed similar appearing pancreatic duct in 2017 unchanged from previous. Repeated CT scan showed decreased dilation of common bile duct persistent density in the disc and common bile duct.  Acute on chronic blood loss anemia No signs of overt bleeding. Continue PPI and Carafate. Hemoglobin has remained stable .  History of seizure disorder: Unprovoked change Keppra to oral.  Sinus bradycardia: Has resolved.  Opiate dependence: Continue current regimen.  Anxiety: Continue Xanax 3 times a day seems to be stable.  Hypokalemia: Replete improved.  Low back pain after fall: CT scan of lumbar spine and thoracic spine have been ordered.  Narcotic seeking behavior: Noted.  Sacral cubitus ulcer stage II present on admission RN Pressure Injury Documentation: Pressure Injury 10/31/22 Sacrum Medial Stage 2 -  Partial thickness loss of dermis presenting as a shallow open injury with a red, pink wound bed without slough. (Active)  10/31/22 2322  Location: Sacrum  Location Orientation: Medial  Staging: Stage 2 -  Partial thickness loss of dermis presenting as a shallow open injury with a red, pink wound bed without slough.  Wound Description (Comments):   Present on Admission: Yes  Dressing Type Foam - Lift dressing to assess site every shift 11/24/22 2049    DVT prophylaxis: lovenox Family Communication:non4 Status is: Inpatient Remains inpatient appropriate because: Acute perforated peptic ulcer disease with peritonitis  Code Status:     Code Status Orders  (From admission, onward)            Start     Ordered   10/31/22 1734  Full code  Continuous        10/31/22 1747           Code Status History     Date Active Date Inactive Code Status Order ID Comments User Context   09/29/2022 0431 10/04/2022 2211 Full Code [id number]  Lestine Mount, PA-C ED   07/27/2022 2304 07/30/2022 2000 Full Code [id number]  Rolla Plate, DO Inpatient   07/18/2022 2114 07/23/2022 0012 Full Code [id number]  Roxan Hockey, MD ED   09/21/2016 0441 09/21/2016 1627 Full Code [id number]  Rolland Porter, MD ED   05/08/2016 2307 05/10/2016 1521 Full Code [id number]  Opyd, Ilene Qua, MD ED         IV Access:   Peripheral IV   Procedures and diagnostic studies:   DG Chest Port 1 View  Result Date: 11/25/2022 CLINICAL DATA:  Sepsis EXAM: PORTABLE CHEST 1 VIEW COMPARISON:  11/18/2022 FINDINGS: Cardiac shadow is stable. Aortic calcifications are again seen. Left lung is clear. New large right-sided pleural effusion is noted. No pneumothorax is noted. IMPRESSION: New large right-sided pleural effusion. Electronically Signed   By: Inez Catalina M.D.   On: 11/25/2022 01:01   CT ABDOMEN PELVIS W WO CONTRAST  Result Date: 11/24/2022 CLINICAL DATA:  Biliary obstruction EXAM: CT ABDOMEN AND PELVIS WITHOUT AND WITH CONTRAST TECHNIQUE: Multidetector CT imaging of the abdomen and pelvis was performed following the standard protocol before and following the bolus administration of intravenous contrast. RADIATION DOSE REDUCTION: This exam was performed according to the departmental dose-optimization program which includes automated exposure control, adjustment of the mA and/or kV according to patient size and/or use of iterative reconstruction technique. CONTRAST:  23mL OMNIPAQUE IOHEXOL 350 MG/ML SOLN COMPARISON:  CT 11/09/2022 FINDINGS: Lower chest: Large RIGHT pleural effusion increased from comparison exam. Dense atelectasis the RIGHT lower lobe Hepatobiliary: Common bile duct measures 6 mm decreased  from 9 mm on prior. There is persistent soft tissue density in the distal common bile duct (image 43/8). No intrahepatic biliary duct dilatation. Gallbladder normal. Pancreas: Pancreas is normal. No ductal dilatation. No pancreatic inflammation. Spleen: Normal spleen Adrenals/urinary tract: Adrenal glands and kidneys are normal. The ureters and bladder normal. Stomach/Bowel: The stomach, duodenum, and small bowel normal. Moderate volume stool throughout the colon. Obstructing lesion. Vascular/Lymphatic: Abdominal aorta is normal caliber with atherosclerotic calcification. There is no retroperitoneal or periportal lymphadenopathy. No pelvic lymphadenopathy. Reproductive: Post hysterectomy.  Adnexa unremarkable Other: No free fluid. Gas in the ventral subcutaneous abdominal wall related to subcu injections. Musculoskeletal: Posterior lumbar fusion.  No complication. IMPRESSION: 1. Dilatation of the common bile duct is slightly reduced from comparison exam. Persistent density in the distal common bile duct. No intrahepatic biliary duct dilatation. 2. Increasing RIGHT pleural effusion with dense RIGHT lobe atelectasis. 3. No bowel obstruction. 4. Moderate volume stool throughout colon. Electronically Signed   By: Suzy Bouchard M.D.   On: 11/24/2022 08:16     Medical Consultants:   None.   Subjective:    Valene Bors patient had an episode of hypotension overnight and fever, this morning she is anorexic complaining of right-sided pain  Objective:    Vitals:   11/25/22 0001 11/25/22 0148 11/25/22 0529 11/25/22 0803  BP: (!) 86/50 (!) 114/57 (!) 107/54 (!) 115/59  Pulse: 74  73 86 83  Resp: 18 20 19 16   Temp: 98.9 F (37.2 C) 98.1 F (36.7 C) 100.3 F (37.9 C) 98.2 F (36.8 C)  TempSrc: Oral Oral Oral Oral  SpO2: 94% 100% 95% 96%  Weight:      Height:       SpO2: 96 % O2 Flow Rate (L/min): 2 L/min   Intake/Output Summary (Last 24 hours) at 11/25/2022 0828 Last data filed at 11/25/2022  0400 Gross per 24 hour  Intake 578.29 ml  Output --  Net 578.29 ml    Filed Weights   10/31/22 1738 10/31/22 2200  Weight: 59.9 kg 57.5 kg    Exam: General exam: In no acute distress. Respiratory system: Good air movement and clear to auscultation. Cardiovascular system: S1 & S2 heard, RRR. No JVD. Gastrointestinal system: Abdomen is nondistended, soft and nontender.  Extremities: No pedal edema. Skin: No rashes, lesions or ulcers  Data Reviewed:    Labs: Basic Metabolic Panel: Recent Labs  Lab 11/22/22 1544 11/24/22 1751 11/25/22 0346  NA 138 135 135  K 3.6 3.3* 3.3*  CL 104 103 103  CO2 25 21* 22  GLUCOSE 101* 99 93  BUN 5* 7* 8  CREATININE 0.67 0.76 0.75  CALCIUM 9.3 9.4 9.4    GFR Estimated Creatinine Clearance: 61.1 mL/min (by C-G formula based on SCr of 0.75 mg/dL). Liver Function Tests: Recent Labs  Lab 11/22/22 1544 11/23/22 0903 11/25/22 0346  AST 15 12* 13*  ALT 11 9 7   ALKPHOS 123 102 104  BILITOT 0.6 0.2* 0.2*  PROT 5.9* 5.5* 6.5  ALBUMIN 2.5* 2.3* 2.6*    No results for input(s): "LIPASE", "AMYLASE" in the last 168 hours. No results for input(s): "AMMONIA" in the last 168 hours. Coagulation profile No results for input(s): "INR", "PROTIME" in the last 168 hours. COVID-19 Labs  No results for input(s): "DDIMER", "FERRITIN", "LDH", "CRP" in the last 72 hours.  Lab Results  Component Value Date   SARSCOV2NAA NEGATIVE 05/04/2022   [place] NEGATIVE 05/19/2021    CBC: Recent Labs  Lab 11/22/22 1544 11/23/22 0903 11/24/22 1751  WBC 9.5 9.3 10.8*  NEUTROABS  --  6.0 7.4  HGB 8.8* 7.9* 9.0*  HCT 28.9* 27.1* 30.7*  MCV 79.6* 81.6 80.2  PLT 447* 421* 497*    Cardiac Enzymes: No results for input(s): "CKTOTAL", "CKMB", "CKMBINDEX", "TROPONINI" in the last 168 hours. BNP (last 3 results) No results for input(s): "PROBNP" in the last 8760 hours. CBG: No results for input(s): "GLUCAP" in the last 168 hours. D-Dimer: No  results for input(s): "DDIMER" in the last 72 hours. Hgb A1c: No results for input(s): "HGBA1C" in the last 72 hours. Lipid Profile: No results for input(s): "CHOL", "HDL", "LDLCALC", "TRIG", "CHOLHDL", "LDLDIRECT" in the last 72 hours. Thyroid function studies: No results for input(s): "TSH", "T4TOTAL", "T3FREE", "THYROIDAB" in the last 72 hours.  Invalid input(s): "FREET3" Anemia work up: No results for input(s): "VITAMINB12", "FOLATE", "FERRITIN", "TIBC", "IRON", "RETICCTPCT" in the last 72 hours. Sepsis Labs: Recent Labs  Lab 11/22/22 1544 11/23/22 0903 11/24/22 1751 11/25/22 0634  WBC 9.5 9.3 10.8*  --   LATICACIDVEN  --   --   --  0.9    Microbiology No results found for this or any previous visit (from the past 240 hour(s)).    Medications:    enoxaparin (LOVENOX) injection  40 mg Subcutaneous Q24H   levETIRAcetam  500 mg Oral BID   multivitamin with minerals  1 tablet  Oral Daily   pantoprazole  40 mg Oral BID   polyethylene glycol  17 g Oral BID   potassium chloride  40 mEq Oral BID   psyllium  1 packet Oral Daily   saccharomyces boulardii  250 mg Oral BID   senna  2 tablet Oral QHS   sucralfate  1 g Oral TID WC & HS   Continuous Infusions:      LOS: 25 days   Marinda Elk  [facility]  11/25/2022, 8:28 AM

## 2022-11-25 NOTE — Progress Notes (Signed)
Patient daughter Jamie [name] in for a visit. Patient states she is ok with the attending MD to speak about her condition and ongoing treatment.

## 2022-11-25 NOTE — Progress Notes (Signed)
   11/25/22 0001  Vitals  Temp 98.9 F (37.2 C)  Temp Source Oral  BP (!) 86/50  MAP (mmHg) (!) 62  BP Location Left Arm  BP Method Automatic  Patient Position (if appropriate) Lying  Pulse Rate 74  Pulse Rate Source Dinamap  Resp 18  MEWS COLOR  MEWS Score Color Green  Oxygen Therapy  SpO2 94 %  O2 Device Nasal Cannula  O2 Flow Rate (L/min) 2 L/min  Pain Assessment  Pain Scale 0-10  Pain Score Asleep  MEWS Score  MEWS Temp 0  MEWS Systolic 1  MEWS Pulse 0  MEWS RR 0  MEWS LOC 0  MEWS Score 1  Provider Notification  Provider Name/Title Anthoney Harada NP  Date Provider Notified 11/25/22  Time Provider Notified 0007  Method of Notification Page (Secure chat)  Notification Reason Other (Comment) (hypotension)  Provider response See new orders  Date of Provider Response 11/25/22  Time of Provider Response [phone]

## 2022-11-25 NOTE — Progress Notes (Signed)
Mobility Specialist - Progress Note   11/25/22 1400  Mobility  Activity Moved into chair position in bed;Refused mobility  Assistive Device None  Distance Ambulated (ft) 0 ft  Activity Response RN notified (Refused OOB mobility d/t c/o pain)    Pt received in bed initially agreeable to mobility. Repositioned pt and raised HOB to allow her to safely drink her beverage. Once seated up, pt requested pain medication. RN informed us that pain medication was not due for a few hours. Pt then declined OOB mobility until she received her next dose. Will follow up as time allows. Left in bed w/ call bell in reach.   Lewie Loron Mobility Specialist Please contact via SecureChat or Rehab office at [phone]

## 2022-11-25 NOTE — Progress Notes (Signed)
Pharmacy Antibiotic Note  Jamie [name] is a 66 y.o. female admitted on 10/31/2022 with pneumonia.  Patient was on antibiotics 11/6 - 11/28 for peritonitis and perforated peptic ulcer. Now with new right-sided pleural effusion with fever and leukocytosis.  Pharmacy has been consulted for vancomycin dosing. Patient is also on ceftriaxone.  12/1 Vancomycin 1250mg  Q 24 hr Scr used: 0.8 mg/dL (rounded up) Weight: kg Vd coeff: 0.72 L/kg Est AUC: 533  Plan: Vancomycin 1250mg  q24hr Continue ceftriaxone per team Monitor MRSA/cultures, clinical status, renal function, vancomycin level Narrow abx as able and f/u duration    Height: 5\' 6"  (167.6 cm) Weight: 57.5 kg (126 lb 12.2 oz) IBW/kg (Calculated) : 59.3  Temp (24hrs), Avg:99.4 F (37.4 C), Min:97.7 F (36.5 C), Max:102.7 F (39.3 C)  Recent Labs  Lab 11/22/22 1544 11/23/22 0903 11/24/22 1751 11/25/22 0346 11/25/22 0634  WBC 9.5 9.3 10.8*  --  8.5  CREATININE 0.67  --  0.76 0.75  --   LATICACIDVEN  --   --   --   --  0.9    Estimated Creatinine Clearance: 61.1 mL/min (by C-G formula based on SCr of 0.75 mg/dL).    Allergies  Allergen Reactions   Almond Oil Itching   Amoxicillin-Pot Clavulanate Nausea And Vomiting and Other (See Comments)    Can tolerate plain Amoxicillin (??)   Azithromycin Nausea And Vomiting   Bupropion Nausea And Vomiting   Nsaids Other (See Comments)    Stomach problems   Prednisone Diarrhea   Vilazodone Diarrhea and Other (See Comments)    Viibryd    Antimicrobials this admission: Cefepime 11/6 >> 11/15 Flagyl 11/6 >> 11/15 Augmentin 11/15 >>11/28 Vanc 12/1 >> CTX 12/1 >>   Dose adjustments this admission: N/a  Microbiology results: 12/1 BCx: pend 11/6 bcx: ngtd 11/6 MRSA PCR: neg  Thank you for allowing pharmacy to be a part of this patient's care.  14/1, PharmD, BCPS, BCCP Clinical Pharmacist  Please check AMION for all [facility] phone numbers After 10:00 PM, call  Main Pharmacy [phone]

## 2022-11-26 ENCOUNTER — Inpatient Hospital Stay (HOSPITAL_COMMUNITY): Payer: Medicare HMO

## 2022-11-26 DIAGNOSIS — J90 Pleural effusion, not elsewhere classified: Secondary | ICD-10-CM

## 2022-11-26 DIAGNOSIS — K668 Other specified disorders of peritoneum: Secondary | ICD-10-CM

## 2022-11-26 DIAGNOSIS — A419 Sepsis, unspecified organism: Secondary | ICD-10-CM

## 2022-11-26 DIAGNOSIS — Z515 Encounter for palliative care: Secondary | ICD-10-CM | POA: Diagnosis not present

## 2022-11-26 LAB — ALBUMIN, PLEURAL OR PERITONEAL FLUID: Albumin, Fluid: 2 g/dL

## 2022-11-26 LAB — BODY FLUID CELL COUNT WITH DIFFERENTIAL
Eos, Fluid: 1 %
Lymphs, Fluid: 12 %
Monocyte-Macrophage-Serous Fluid: 17 % — ABNORMAL LOW (ref 50–90)
Neutrophil Count, Fluid: 70 % — ABNORMAL HIGH (ref 0–25)
Total Nucleated Cell Count, Fluid: 2206 cu mm — ABNORMAL HIGH (ref 0–1000)

## 2022-11-26 LAB — GRAM STAIN

## 2022-11-26 LAB — GLUCOSE, PLEURAL OR PERITONEAL FLUID: Glucose, Fluid: 93 mg/dL

## 2022-11-26 LAB — PROTEIN, PLEURAL OR PERITONEAL FLUID: Total protein, fluid: 4 g/dL

## 2022-11-26 LAB — LACTATE DEHYDROGENASE, PLEURAL OR PERITONEAL FLUID: LD, Fluid: 270 U/L — ABNORMAL HIGH (ref 3–23)

## 2022-11-26 MED ORDER — LIDOCAINE HCL (PF) 1 % IJ SOLN
INTRAMUSCULAR | Status: AC
  Filled 2022-11-26: qty 30

## 2022-11-26 MED ORDER — ALPRAZOLAM 0.25 MG PO TABS
1.0000 mg | ORAL_TABLET | Freq: Three times a day (TID) | ORAL | Status: DC | PRN
  Administered 2022-11-26: 1 mg via ORAL
  Filled 2022-11-26: qty 4

## 2022-11-26 MED ORDER — LIDOCAINE HCL (PF) 1 % IJ SOLN: 5.0000 mL | Freq: Once | INTRAMUSCULAR | Status: DC

## 2022-11-26 MED ORDER — PANTOPRAZOLE SODIUM 40 MG PO TBEC
40.0000 mg | DELAYED_RELEASE_TABLET | Freq: Two times a day (BID) | ORAL | Status: DC
  Administered 2022-11-26 – 2022-11-28 (×4): 40 mg via ORAL
  Filled 2022-11-26 (×4): qty 1

## 2022-11-26 NOTE — Progress Notes (Signed)
[facility] CONSULT PROGRESS NOTE    Progress Note  Jamie [name]  ZDG:[id number] DOB: August 26, 1956 DOA: 10/31/2022 PCP: Barbie Banner, MD     Brief Narrative:   Jamie [name] is an 66 y.o. female past medical history of anxiety alcohol abuse in remission, chronic back pain, history of seizure disorder and discharged from the hospital on October 04, 2022 she was treated for sepsis secondary to pneumonia treated with pressors hospital stay was complicated by seizures comes in again for right upper quadrant abdominal pain that began on 10/29/2022 in the ED CT was done that showed free air in the abdomen, was found to be shock in the perforated viscus and peritonitis, surgery was consulted.  CT also showed intrahepatic and extrahepatic duct dilation with a CBD stone and pancreatic atrophy with pancreatic duct dilation she was started empirically on cefepime and Flagyl.  She has completed her course of antibiotics in house.    New right-sided pleural effusion with fever and leukocytosis started empirically on antibiotics    Assessment/Plan:   Septic shock secondary to peritonitis due to perforated peptic ulcer in the setting of BC powder use: Sepsis now resolved. Off pressors, completed 14-day course of antibiotics for intra-abdominal infection. Physical therapy evaluated the patient, she will need to go to skilled nursing facility Awaiting insurance of approval for discharge.  Acute perforated peptic ulcer disease peritonitis (HCC) Conservative management for now, has completed 14-day course of antibiotics.  Mild fever, complaining of New right upper quadrant pain/with new right-sided pleural effusion: Tmax of 98.7. IR to perform ultrasound-guided thoracocentesis.  Sent for culture and cell count. Continue IV vancomycin and Rocephin. Cultures are negative till date.  Choledocholithiasis biliary extraperitoneal hepatic ductal dilation: MRCP showed ductal dilation to small stone  measuring 4 mm with mildly dilated gallbladder and pericholecystic fluid to go to go for acute cholecystitis. GI will follow-up as an outpatient.  Pancreatic ductal dilation: Her CT was done that showed similar appearing pancreatic duct in 2017 unchanged from previous. Repeated CT scan showed decreased dilation of common bile duct persistent density in the disc and common bile duct.  Acute on chronic blood loss anemia No signs of overt bleeding. Continue PPI and Carafate. Hemoglobin has remained stable .  History of seizure disorder: Unprovoked change Keppra to oral.  Sinus bradycardia: Has resolved.  Opiate dependence: Continue current regimen.  Anxiety: Continue Xanax 3 times a day seems to be stable.  Hypokalemia: Replete improved.  Low back pain after fall: CT scan of lumbar spine and thoracic spine have been ordered.  Narcotic seeking behavior: Noted.  Sacral cubitus ulcer stage II present on admission RN Pressure Injury Documentation: Pressure Injury 10/31/22 Sacrum Medial Stage 2 -  Partial thickness loss of dermis presenting as a shallow open injury with a red, pink wound bed without slough. (Active)  10/31/22 2322  Location: Sacrum  Location Orientation: Medial  Staging: Stage 2 -  Partial thickness loss of dermis presenting as a shallow open injury with a red, pink wound bed without slough.  Wound Description (Comments):   Present on Admission: Yes  Dressing Type Foam - Lift dressing to assess site every shift 11/25/22 1000    DVT prophylaxis: lovenox Family Communication:non4 Status is: Inpatient Remains inpatient appropriate because: Acute perforated peptic ulcer disease with peritonitis    Code Status:     Code Status Orders  (From admission, onward)           Start     Ordered  10/31/22 1734  Full code  Continuous        10/31/22 1747           Code Status History     Date Active Date Inactive Code Status Order ID Comments User  Context   09/29/2022 0431 10/04/2022 2211 Full Code [id number]  Tim Lair, PA-C ED   07/27/2022 2304 07/30/2022 2000 Full Code [id number]  Lilyan Gilford, DO Inpatient   07/18/2022 2114 07/23/2022 0012 Full Code [id number]  Shon Hale, MD ED   09/21/2016 0441 09/21/2016 1627 Full Code [id number]  Devoria Albe, MD ED   05/08/2016 2307 05/10/2016 1521 Full Code [id number]  Opyd, Lavone Neri, MD ED         IV Access:   Peripheral IV   Procedures and diagnostic studies:   DG Chest Port 1 View  Result Date: 11/25/2022 CLINICAL DATA:  Sepsis EXAM: PORTABLE CHEST 1 VIEW COMPARISON:  11/18/2022 FINDINGS: Cardiac shadow is stable. Aortic calcifications are again seen. Left lung is clear. New large right-sided pleural effusion is noted. No pneumothorax is noted. IMPRESSION: New large right-sided pleural effusion. Electronically Signed   By: Alcide Clever M.D.   On: 11/25/2022 01:01     Medical Consultants:   None.   Subjective:    Jamie [name] continues to complain of right-sided pleuritic chest pain.  Objective:    Vitals:   11/25/22 0529 11/25/22 0803 11/25/22 1652 11/26/22 0630  BP: (!) 107/54 (!) 115/59 128/62 119/65  Pulse: 86 83 76 84  Resp: 19 16 16 17   Temp: 100.3 F (37.9 C) 98.2 F (36.8 C) 98.6 F (37 C) 98.7 F (37.1 C)  TempSrc: Oral Oral Oral Oral  SpO2: 95% 96% 100% 100%  Weight:      Height:       SpO2: 100 % O2 Flow Rate (L/min): 2 L/min   Intake/Output Summary (Last 24 hours) at 11/26/2022 0756 Last data filed at 11/25/2022 1700 Gross per 24 hour  Intake 350 ml  Output --  Net 350 ml    Filed Weights   10/31/22 1738 10/31/22 2200  Weight: 59.9 kg 57.5 kg    Exam: General exam: In no acute distress. Respiratory system: Good air movement and clear to auscultation. Cardiovascular system: S1 & S2 heard, RRR. No JVD. Gastrointestinal system: Abdomen is nondistended, soft and nontender.  Extremities: No pedal edema. Skin: No rashes, lesions  or ulcers Psychiatry: Judgement and insight appear normal. Mood & affect appropriate. Data Reviewed:    Labs: Basic Metabolic Panel: Recent Labs  Lab 11/22/22 1544 11/24/22 1751 11/25/22 0346  NA 138 135 135  K 3.6 3.3* 3.3*  CL 104 103 103  CO2 25 21* 22  GLUCOSE 101* 99 93  BUN 5* 7* 8  CREATININE 0.67 0.76 0.75  CALCIUM 9.3 9.4 9.4    GFR Estimated Creatinine Clearance: 61.1 mL/min (by C-G formula based on SCr of 0.75 mg/dL). Liver Function Tests: Recent Labs  Lab 11/22/22 1544 11/23/22 0903 11/25/22 0346  AST 15 12* 13*  ALT 11 9 7   ALKPHOS 123 102 104  BILITOT 0.6 0.2* 0.2*  PROT 5.9* 5.5* 6.5  ALBUMIN 2.5* 2.3* 2.6*    No results for input(s): "LIPASE", "AMYLASE" in the last 168 hours. No results for input(s): "AMMONIA" in the last 168 hours. Coagulation profile No results for input(s): "INR", "PROTIME" in the last 168 hours. COVID-19 Labs  No results for input(s): "DDIMER", "FERRITIN", "LDH", "CRP" in the last 72 hours.  Lab Results  Component Value Date   SARSCOV2NAA NEGATIVE 05/04/2022   SARSCOV2NAA NEGATIVE 05/19/2021    CBC: Recent Labs  Lab 11/22/22 1544 11/23/22 0903 11/24/22 1751 11/25/22 0634  WBC 9.5 9.3 10.8* 8.5  NEUTROABS  --  6.0 7.4 6.2  HGB 8.8* 7.9* 9.0* 7.7*  HCT 28.9* 27.1* 30.7* 25.2*  MCV 79.6* 81.6 80.2 78.3*  PLT 447* 421* 497* 392    Cardiac Enzymes: No results for input(s): "CKTOTAL", "CKMB", "CKMBINDEX", "TROPONINI" in the last 168 hours. BNP (last 3 results) No results for input(s): "PROBNP" in the last 8760 hours. CBG: No results for input(s): "GLUCAP" in the last 168 hours. D-Dimer: No results for input(s): "DDIMER" in the last 72 hours. Hgb A1c: No results for input(s): "HGBA1C" in the last 72 hours. Lipid Profile: No results for input(s): "CHOL", "HDL", "LDLCALC", "TRIG", "CHOLHDL", "LDLDIRECT" in the last 72 hours. Thyroid function studies: No results for input(s): "TSH", "T4TOTAL", "T3FREE",  "THYROIDAB" in the last 72 hours.  Invalid input(s): "FREET3" Anemia work up: No results for input(s): "VITAMINB12", "FOLATE", "FERRITIN", "TIBC", "IRON", "RETICCTPCT" in the last 72 hours. Sepsis Labs: Recent Labs  Lab 11/22/22 1544 11/23/22 0903 11/24/22 1751 11/25/22 0634  WBC 9.5 9.3 10.8* 8.5  LATICACIDVEN  --   --   --  0.9    Microbiology Recent Results (from the past 240 hour(s))  Culture, blood (Routine X 2) w Reflex to ID Panel     Status: None (Preliminary result)   Collection Time: 11/25/22 10:55 AM   Specimen: BLOOD  Result Value Ref Range Status   Specimen Description BLOOD LEFT ANTECUBITAL  Final   Special Requests   Final    BOTTLES DRAWN AEROBIC AND ANAEROBIC Blood Culture adequate volume   Culture   Final    NO GROWTH < 24 HOURS Performed at [facility], [address] [place], [place] [address]    Report Status PENDING  Incomplete  Culture, blood (Routine X 2) w Reflex to ID Panel     Status: None (Preliminary result)   Collection Time: 11/25/22 10:55 AM   Specimen: BLOOD LEFT HAND  Result Value Ref Range Status   Specimen Description BLOOD LEFT HAND  Final   Special Requests   Final    BOTTLES DRAWN AEROBIC AND ANAEROBIC Blood Culture adequate volume   Culture   Final    NO GROWTH < 24 HOURS Performed at [facility], [address], [place], [place] [address]    Report Status PENDING  Incomplete      Medications:    enoxaparin (LOVENOX) injection  40 mg Subcutaneous Q24H   levETIRAcetam  500 mg Oral BID   multivitamin with minerals  1 tablet Oral Daily   pantoprazole  40 mg Oral Daily   psyllium  1 packet Oral Daily   saccharomyces boulardii  250 mg Oral BID   senna  2 tablet Oral QHS   sucralfate  1 g Oral TID WC & HS   tiZANidine  4 mg Oral TID   Continuous Infusions:  cefTRIAXone (ROCEPHIN)  IV 2 g (11/25/22 1055)   vancomycin 1,250 mg (11/25/22 1516)       LOS: 26 days   Marinda Elk  [facility]  11/26/2022, 7:56 AM

## 2022-11-26 NOTE — Procedures (Signed)
PROCEDURE SUMMARY:  Successful US guided right thoracentesis. Yielded 1.7 L of clear yellow fluid. Patient tolerated procedure well. No immediate complications. EBL = trace  Specimen was sent for labs.  Post procedure chest X-ray reveals no pneumothorax  Donte Kary S Yovanny Coats PA-C 11/26/2022 12:05 PM

## 2022-11-26 NOTE — Progress Notes (Signed)
Palliative Medicine Inpatient Follow Up Note HPI: 66 year old female with a history of chronic pain syndrome, anxiety, alcohol use in remission, chronic back pain, seizure presenting with worsening upper abdominal pain that began on 10/29/2022 -identified to have septic shock due to peritonitis which has resolved.  Patient still having right upper abdominal pain.  The palliative medicine team has been consulted for ongoing symptom management and goals of care conversations.   Today's Discussion 11/26/2022  *Please note that this is a verbal dictation therefore any spelling or grammatical errors are due to the "[place]" system interpretation.  Chart reviewed inclusive of vital signs, progress notes, laboratory results, and diagnostic images.   I met with Jamie [name] at bedside this morning. She shares that she remains to have pain in her abdomen on the R side. She is awaiting thoracentesis and is hopeful that this will relieve some of the pain. She at this time would like to remain on the oxycodone as is prescribed and expresses apprehension at the idea of restarting OxyContin as she was told it contributed to altered mental status. We reviewed that she was also quite ill during that time from pleural effusion and it's unclear what caused this state though certainly narcotics can contribute. Jamie [name] wants to keep her regiment as it is presently.   Jamie [name] does continue to have bowel movements. He fever curve has normalized. She is sleeping well as per conversation with her night RN.   Jamie [name] is very much aligned with transitioning the rehabilitation once her acute medical issues resolve.   Questions and concerns addressed/Palliative Support Provided.   Objective Assessment: Vital Signs Vitals:   11/25/22 1652 11/26/22 0630  BP: 128/62 119/65  Pulse: 76 84  Resp: 16 17  Temp: 98.6 F (37 C) 98.7 F (37.1 C)  SpO2: 100% 100%    Intake/Output Summary (Last 24 hours) at 11/26/2022  6237 Last data filed at 11/25/2022 1700 Gross per 24 hour  Intake 350 ml  Output --  Net 350 ml    Last Weight  Most recent update: 10/31/2022 10:04 PM    Weight  57.5 kg (126 lb 12.2 oz)            Gen: Elderly Caucasian female in moderate distress HEENT: moist mucous membranes CV: Regular rate and rhythm  PULM: On 2LPM [place],  breathing is even and nonlabored ABD: Tender right upper quadrant EXT: No edema Neuro: Alert and oriented x3  SUMMARY OF RECOMMENDATIONS   DNAR/DNI   Symptom management as below  Plan for thorcentesis today   Patient's goals are for recovery and return to a level of independence   Patient is open to short-term stay at skilled nursing - Has now been approved for [place] care will continue to support patient and aid in ongoing symptom management  Symptom Management:  Acute on chronic pain: -OxyContin discontinued d/t sepsis and soft pressures --> I spoke to patient about restarting at a lower dose though she is apprehensive -Continue Oxycodone 20 mg every 4 hours as needed -Continue Zanaflex 4 mg 3 times daily -Upon discharge patient plans to see her pain specialist - [name] (per PMP aware has prescribed oxycodone regularly)   Anxiety: -Continue Xanax 1 mg 3 times daily as needed   Adult failure to thrive: -Appreciate dietary involvement as is eating 0-25% of meals   Constipation: -Continue senna Two Tabs QHS   Nausea: - Continue zofran as needed  Billing based on MDM: High  ______________________________________________________________________________________ [place] Team Team Cell Phone: [phone] Please utilize secure chat with additional questions, if there is no response within 30 minutes please call the above phone number  Palliative Medicine Team providers are available by phone from 7am to 7pm daily and can be reached through the team cell phone.  Should this patient  require assistance outside of these hours, please call the patient's attending physician.

## 2022-11-27 DIAGNOSIS — J189 Pneumonia, unspecified organism: Secondary | ICD-10-CM

## 2022-11-27 DIAGNOSIS — Z515 Encounter for palliative care: Secondary | ICD-10-CM | POA: Diagnosis not present

## 2022-11-27 DIAGNOSIS — K668 Other specified disorders of peritoneum: Secondary | ICD-10-CM | POA: Diagnosis not present

## 2022-11-27 DIAGNOSIS — A419 Sepsis, unspecified organism: Secondary | ICD-10-CM | POA: Diagnosis not present

## 2022-11-27 DIAGNOSIS — K659 Peritonitis, unspecified: Secondary | ICD-10-CM | POA: Diagnosis not present

## 2022-11-27 MED ORDER — CEFDINIR 300 MG PO CAPS
300.0000 mg | ORAL_CAPSULE | Freq: Two times a day (BID) | ORAL | Status: DC
  Administered 2022-11-27 – 2022-11-28 (×3): 300 mg via ORAL
  Filled 2022-11-27 (×4): qty 1

## 2022-11-27 MED ORDER — OXYCODONE HCL ER 10 MG PO T12A
10.0000 mg | EXTENDED_RELEASE_TABLET | Freq: Two times a day (BID) | ORAL | Status: DC
  Administered 2022-11-27 – 2022-11-28 (×3): 10 mg via ORAL
  Filled 2022-11-27 (×3): qty 1

## 2022-11-27 MED ORDER — ALPRAZOLAM 0.25 MG PO TABS
0.5000 mg | ORAL_TABLET | Freq: Three times a day (TID) | ORAL | Status: DC | PRN
  Administered 2022-11-28 (×2): 0.5 mg via ORAL
  Filled 2022-11-27 (×2): qty 2

## 2022-11-27 MED ORDER — AZITHROMYCIN 250 MG PO TABS
500.0000 mg | ORAL_TABLET | Freq: Every day | ORAL | Status: DC
  Administered 2022-11-27 – 2022-11-28 (×2): 500 mg via ORAL
  Filled 2022-11-27 (×2): qty 2

## 2022-11-27 MED ORDER — OXYCODONE HCL 5 MG PO TABS
10.0000 mg | ORAL_TABLET | ORAL | Status: DC | PRN
  Administered 2022-11-27 – 2022-11-28 (×4): 10 mg via ORAL
  Filled 2022-11-27 (×5): qty 2

## 2022-11-27 NOTE — Progress Notes (Signed)
[facility] CONSULT PROGRESS NOTE    Progress Note  Jamie [name]  CBJ:[id number] DOB: 11-Nov-1956 DOA: 10/31/2022 PCP: Barbie Banner, MD     Brief Narrative:   Jamie [name] is an 66 y.o. female past medical history of anxiety alcohol abuse in remission, chronic back pain, history of seizure disorder and discharged from the hospital on October 04, 2022 she was treated for sepsis secondary to pneumonia treated with pressors hospital stay was complicated by seizures comes in again for right upper quadrant abdominal pain that began on 10/29/2022 in the ED CT was done that showed free air in the abdomen, was found to be shock in the perforated viscus and peritonitis, surgery was consulted.  CT also showed intrahepatic and extrahepatic duct dilation with a CBD stone and pancreatic atrophy with pancreatic duct dilation she was started empirically on cefepime and Flagyl.  She has completed her course of antibiotics in house.    New right-sided pleural effusion with fever and leukocytosis started empirically on antibiotics    Assessment/Plan:   Septic shock secondary to peritonitis due to perforated peptic ulcer in the setting of BC powder use: Sepsis now resolved. Off pressors, completed 14-day course of antibiotics for intra-abdominal infection. Physical therapy evaluated the patient, she will need to go to skilled nursing facility Awaiting insurance of approval for discharge.  Acute perforated peptic ulcer disease peritonitis (HCC) Conservative management for now, has completed 14-day course of antibiotics.  Right lower lobe pneumonia with new right-sided pleural effusion: Tmax 99.1. IR was consulted perform ultrasound-guided thoracocentesis, due to infectious etiology currently on IV Rocephin and IV vancomycin continues to remain afebrile. Cultures remain negative. Will transition to [facility] and azithromycin.  Choledocholithiasis biliary extraperitoneal hepatic ductal  dilation: MRCP showed ductal dilation to small stone measuring 4 mm with mildly dilated gallbladder and pericholecystic fluid to go to go for acute cholecystitis. GI will follow-up as an outpatient.  Pancreatic ductal dilation: Her CT was done that showed similar appearing pancreatic duct in 2017 unchanged from previous. Repeated CT scan showed decreased dilation of common bile duct persistent density in the disc and common bile duct.  Acute on chronic blood loss anemia No signs of overt bleeding. Continue PPI and Carafate. Hemoglobin has remained stable .  History of seizure disorder: Unprovoked change Keppra to oral.  Sinus bradycardia: Has resolved.  Opiate dependence: Continue current regimen.  Anxiety: Continue Xanax 3 times a day seems to be stable.  Hypokalemia: Replete improved.  Low back pain after fall: CT scan of lumbar spine and thoracic spine have been ordered.  Narcotic seeking behavior: Noted.  Sacral cubitus ulcer stage II present on admission RN Pressure Injury Documentation: Pressure Injury 10/31/22 Sacrum Medial Stage 2 -  Partial thickness loss of dermis presenting as a shallow open injury with a red, pink wound bed without slough. (Active)  10/31/22 2322  Location: Sacrum  Location Orientation: Medial  Staging: Stage 2 -  Partial thickness loss of dermis presenting as a shallow open injury with a red, pink wound bed without slough.  Wound Description (Comments):   Present on Admission: Yes  Dressing Type Moisture barrier 11/26/22 2008    DVT prophylaxis: lovenox Family Communication:non4 Status is: Inpatient Remains inpatient appropriate because: Acute perforated peptic ulcer disease with peritonitis    Code Status:     Code Status Orders  (From admission, onward)           Start     Ordered   10/31/22 1734  Full code  Continuous        10/31/22 1747           Code Status History     Date Active Date Inactive Code Status  Order ID Comments User Context   09/29/2022 0431 10/04/2022 2211 Full Code [id number]  [facility] ED   07/27/2022 2304 07/30/2022 2000 Full Code [id number]  Lilyan Gilford, DO Inpatient   07/18/2022 2114 07/23/2022 0012 Full Code [id number]  Shon Hale, MD ED   09/21/2016 0441 09/21/2016 1627 Full Code [id number]  Devoria Albe, MD ED   05/08/2016 2307 05/10/2016 1521 Full Code [id number]  Opyd, Lavone Neri, MD ED         IV Access:   Peripheral IV   Procedures and diagnostic studies:   US THORACENTESIS ASP PLEURAL SPACE W/IMG GUIDE  Result Date: 11/26/2022 INDICATION: Sepsis secondary to pneumonia with right pleural effusion. Request for diagnostic and therapeutic thoracentesis. EXAM: ULTRASOUND GUIDED RIGHT THORACENTESIS MEDICATIONS: None. COMPLICATIONS: None immediate. PROCEDURE: An ultrasound guided thoracentesis was thoroughly discussed with the patient and questions answered. The benefits, risks, alternatives and complications were also discussed. The patient understands and wishes to proceed with the procedure. Written consent was obtained. Ultrasound was performed to localize and mark an adequate pock 1% lidocaine 10 mL et of fluid in the right chest. The area was then prepped and draped in the normal sterile fashion. 1% Lidocaine was used for local anesthesia. Under ultrasound guidance a 6 Fr Safe-T-Centesis catheter was introduced. Thoracentesis was performed. The catheter was removed and a dressing applied. FINDINGS: A total of approximately 1.7 L of clear yellow fluid was removed. Samples were sent to the laboratory as requested by the clinical team. IMPRESSION: Successful ultrasound guided right thoracentesis yielding 1.7 L of pleural fluid. No pneumothorax on post-procedure chest x-ray. Procedure performed by: Corrin Parker, PA-C Electronically Signed   By: Olive Bass M.D.   On: 11/26/2022 12:09   DG Chest 1 View  Result Date: 11/26/2022 CLINICAL DATA:  66 year old  female status post right thoracentesis. EXAM: CHEST  1 VIEW COMPARISON:  Chest x-ray 11/25/2022. FINDINGS: Previously noted right-sided pleural effusion has decreased in size, currently small. No appreciable postprocedural pneumothorax identified. Persistent opacity in the base of the right lung which may reflect atelectasis and/or consolidation in the right lower lobe. Minimal subsegmental atelectasis or scarring in the periphery of the left lung base. No evidence of pulmonary edema. Heart size is normal. Upper mediastinal contours are within normal limits. IMPRESSION: 1. Decreased size of what is now a small right pleural effusion following thoracentesis. Persistent atelectasis and/or consolidation in the right lower lobe. Electronically Signed   By: Trudie Reed M.D.   On: 11/26/2022 10:30     Medical Consultants:   None.   Subjective:    [name] relates right-sided pleuritic pain still there. Objective:    Vitals:   11/26/22 0824 11/26/22 0940 11/26/22 1600 11/26/22 2008  BP: (!) 150/81 113/62 139/62 134/60  Pulse: 100  90 88  Resp: 18  17 18   Temp: 98.9 F (37.2 C)  99.1 F (37.3 C) 98.9 F (37.2 C)  TempSrc: Oral  Oral Oral  SpO2: 93%  94% 92%  Weight:      Height:       SpO2: 92 % O2 Flow Rate (L/min): 2 L/min   Intake/Output Summary (Last 24 hours) at 11/27/2022 0744 Last data filed at 11/26/2022 2036 Gross per 24 hour  Intake 590 ml  Output --  Net 590 ml    Filed Weights   10/31/22 1738 10/31/22 2200  Weight: 59.9 kg 57.5 kg    Exam: General exam: In no acute distress. Respiratory system: Good air movement and clear to auscultation. Cardiovascular system: S1 & S2 heard, RRR. No JVD. Gastrointestinal system: Abdomen is nondistended, soft and nontender.  Extremities: No pedal edema. Skin: No rashes, lesions or ulcers Psychiatry: Judgement and insight appear normal. Mood & affect appropriate. Data Reviewed:    Labs: Basic Metabolic  Panel: Recent Labs  Lab 11/22/22 1544 11/24/22 1751 11/25/22 0346  NA 138 135 135  K 3.6 3.3* 3.3*  CL 104 103 103  CO2 25 21* 22  GLUCOSE 101* 99 93  BUN 5* 7* 8  CREATININE 0.67 0.76 0.75  CALCIUM 9.3 9.4 9.4    GFR Estimated Creatinine Clearance: 61.1 mL/min (by C-G formula based on SCr of 0.75 mg/dL). Liver Function Tests: Recent Labs  Lab 11/22/22 1544 11/23/22 0903 11/25/22 0346  AST 15 12* 13*  ALT 11 9 7   ALKPHOS 123 102 104  BILITOT 0.6 0.2* 0.2*  PROT 5.9* 5.5* 6.5  ALBUMIN 2.5* 2.3* 2.6*    No results for input(s): "LIPASE", "AMYLASE" in the last 168 hours. No results for input(s): "AMMONIA" in the last 168 hours. Coagulation profile No results for input(s): "INR", "PROTIME" in the last 168 hours. COVID-19 Labs  No results for input(s): "DDIMER", "FERRITIN", "LDH", "CRP" in the last 72 hours.  Lab Results  Component Value Date   SARSCOV2NAA NEGATIVE 05/04/2022   SARSCOV2NAA NEGATIVE 05/19/2021    CBC: Recent Labs  Lab 11/22/22 1544 11/23/22 0903 11/24/22 1751 11/25/22 0634  WBC 9.5 9.3 10.8* 8.5  NEUTROABS  --  6.0 7.4 6.2  HGB 8.8* 7.9* 9.0* 7.7*  HCT 28.9* 27.1* 30.7* 25.2*  MCV 79.6* 81.6 80.2 78.3*  PLT 447* 421* 497* 392    Cardiac Enzymes: No results for input(s): "CKTOTAL", "CKMB", "CKMBINDEX", "TROPONINI" in the last 168 hours. BNP (last 3 results) No results for input(s): "PROBNP" in the last 8760 hours. CBG: No results for input(s): "GLUCAP" in the last 168 hours. D-Dimer: No results for input(s): "DDIMER" in the last 72 hours. Hgb A1c: No results for input(s): "HGBA1C" in the last 72 hours. Lipid Profile: No results for input(s): "CHOL", "HDL", "LDLCALC", "TRIG", "CHOLHDL", "LDLDIRECT" in the last 72 hours. Thyroid function studies: No results for input(s): "TSH", "T4TOTAL", "T3FREE", "THYROIDAB" in the last 72 hours.  Invalid input(s): "FREET3" Anemia work up: No results for input(s): "VITAMINB12", "FOLATE",  "FERRITIN", "TIBC", "IRON", "RETICCTPCT" in the last 72 hours. Sepsis Labs: Recent Labs  Lab 11/22/22 1544 11/23/22 0903 11/24/22 1751 11/25/22 0634  WBC 9.5 9.3 10.8* 8.5  LATICACIDVEN  --   --   --  0.9    Microbiology Recent Results (from the past 240 hour(s))  Culture, blood (Routine X 2) w Reflex to ID Panel     Status: None (Preliminary result)   Collection Time: 11/25/22 10:55 AM   Specimen: BLOOD  Result Value Ref Range Status   Specimen Description BLOOD LEFT ANTECUBITAL  Final   Special Requests   Final    BOTTLES DRAWN AEROBIC AND ANAEROBIC Blood Culture adequate volume   Culture   Final    NO GROWTH < 24 HOURS Performed at [facility], [address] [place], [place]    Report Status PENDING  Incomplete  Culture, blood (Routine X 2) w Reflex to ID Panel  Status: None (Preliminary result)   Collection Time: 11/25/22 10:55 AM   Specimen: BLOOD LEFT HAND  Result Value Ref Range Status   Specimen Description BLOOD LEFT HAND  Final   Special Requests   Final    BOTTLES DRAWN AEROBIC AND ANAEROBIC Blood Culture adequate volume   Culture   Final    NO GROWTH < 24 HOURS Performed at [facility], [address] [place], [place] [address]    Report Status PENDING  Incomplete  Gram stain     Status: None   Collection Time: 11/26/22 10:04 AM   Specimen: Pleura  Result Value Ref Range Status   Specimen Description PLEURAL  Final   Special Requests NONE  Final   Gram Stain   Final    MODERATE WBC PRESENT, PREDOMINANTLY PMN NO ORGANISMS SEEN Performed at [facility], [address], [place], [place] [address]    Report Status 11/26/2022 FINAL  Final      Medications:    enoxaparin (LOVENOX) injection  40 mg Subcutaneous Q24H   levETIRAcetam  500 mg Oral BID   lidocaine (PF)  5 mL Intradermal Once   multivitamin with minerals  1 tablet Oral Daily   pantoprazole  40 mg Oral BID   psyllium  1 packet Oral Daily   saccharomyces  boulardii  250 mg Oral BID   senna  2 tablet Oral QHS   sucralfate  1 g Oral TID WC & HS   tiZANidine  4 mg Oral TID   Continuous Infusions:  cefTRIAXone (ROCEPHIN)  IV Stopped (11/26/22 1049)   vancomycin Stopped (11/26/22 1003)       LOS: 27 days   Marinda Elk  [facility]  11/27/2022, 7:44 AM

## 2022-11-27 NOTE — Progress Notes (Signed)
Mobility Specialist: Progress Note   11/27/22 1620  Mobility  Activity Refused mobility   Pt refused mobility secondary to abdominal pain. Despite encouragement pt still declining. Will f/u as able.   Jaelah Hauth Mobility Specialist Please contact via SecureChat or Rehab office at [phone]

## 2022-11-27 NOTE — Progress Notes (Signed)
   Palliative Medicine Inpatient Follow Up Note HPI: 66 year old female with a history of chronic pain syndrome, anxiety, alcohol use in remission, chronic back pain, seizure presenting with worsening upper abdominal pain that began on 10/29/2022 -identified to have septic shock due to peritonitis which has resolved.  Patient still having right upper abdominal pain.  The palliative medicine team has been consulted for ongoing symptom management and goals of care conversations.   Today's Discussion 11/27/2022  *Please note that this is a verbal dictation therefore any spelling or grammatical errors are due to the "[place]" system interpretation.  Chart reviewed inclusive of vital signs, progress notes, laboratory results, and diagnostic images.   I spoke with Tonica's night RN, she shares that patient was asking for medications fairly consistently.   I met with Vaughan Basta at bedside this morning, remains to have RUQ pain. Did have a thoracentesis completed. Plan to modify the pain medications slightly to achieve better control or peaks and valleys of pain.   Discussed the idea of transition to rehab in the oncoming days which patient is in agreement with.   Questions and concerns addressed/Palliative Support Provided.   Objective Assessment: Vital Signs Vitals:   11/26/22 2008 11/27/22 0826  BP: 134/60 (!) 102/58  Pulse: 88 83  Resp: 18 17  Temp: 98.9 F (37.2 C) 97.8 F (36.6 C)  SpO2: 92% 92%    Intake/Output Summary (Last 24 hours) at 11/27/2022 0630 Last data filed at 11/26/2022 2036 Gross per 24 hour  Intake 590 ml  Output --  Net 590 ml    Last Weight  Most recent update: 10/31/2022 10:04 PM    Weight  57.5 kg (126 lb 12.2 oz)            Gen: Elderly Caucasian female in moderate distress HEENT: moist mucous membranes CV: Regular rate and rhythm  PULM: On 2LPM [place],  breathing is even and nonlabored ABD: Tender right upper quadrant EXT: No edema Neuro: Alert  and oriented x3  SUMMARY OF RECOMMENDATIONS   DNAR/DNI   Symptom management as below  Patient's goals are for recovery and return to a level of independence   Patient is open to short-term stay at skilled nursing - Has now been approved for [facility] on Monday   Palliative care will continue to support patient   Symptom Management:  Acute on chronic pain: -OxyContin 59m BID -Continue Oxycodone 10 mg every 4 hours as needed -Continue Zanaflex 4 mg 3 times daily -Upon discharge patient plans to see her pain specialist - YShanda Howells(per PMP aware has prescribed oxycodone regularly)   Anxiety: -Continue Xanax 1 mg 3 times daily as needed   Adult failure to thrive: -Appreciate dietary involvement as is eating 0-25% of meals   Constipation: -Continue senna Two Tabs QHS   Nausea: - Continue zofran as needed  Billing based on MDM: High ______________________________________________________________________________________ [facility] Team Cell Phone: [phone] utilize secure chat with additional questions, if there is no response within 30 minutes please call the above phone number  Palliative Medicine Team providers are available by phone from 7am to 7pm daily and can be reached through the team cell phone.  Should this patient require assistance outside of these hours, please call the patient's attending physician.

## 2022-11-27 NOTE — Plan of Care (Signed)
  Problem: Clinical Measurements: Goal: Will remain free from infection Outcome: Progressing   Problem: Clinical Measurements: Goal: Diagnostic test results will improve Outcome: Progressing   Problem: Clinical Measurements: Goal: Respiratory complications will improve Outcome: Progressing   Problem: Activity: Goal: Risk for activity intolerance will decrease Outcome: Progressing   Problem: Nutrition: Goal: Adequate nutrition will be maintained Outcome: Progressing   Problem: Pain Managment: Goal: General experience of comfort will improve Outcome: Progressing

## 2022-11-28 DIAGNOSIS — A419 Sepsis, unspecified organism: Secondary | ICD-10-CM | POA: Diagnosis not present

## 2022-11-28 DIAGNOSIS — K659 Peritonitis, unspecified: Secondary | ICD-10-CM | POA: Diagnosis not present

## 2022-11-28 DIAGNOSIS — R569 Unspecified convulsions: Secondary | ICD-10-CM | POA: Diagnosis not present

## 2022-11-28 DIAGNOSIS — K668 Other specified disorders of peritoneum: Secondary | ICD-10-CM | POA: Diagnosis not present

## 2022-11-28 MED ORDER — PANTOPRAZOLE SODIUM 40 MG PO TBEC: 40.0000 mg | DELAYED_RELEASE_TABLET | Freq: Every day | ORAL | 1 refills | Status: AC

## 2022-11-28 MED ORDER — AZITHROMYCIN 250 MG PO TABS: ORAL_TABLET | ORAL | 0 refills | Status: AC

## 2022-11-28 MED ORDER — CEFDINIR 300 MG PO CAPS: 300.0000 mg | ORAL_CAPSULE | Freq: Two times a day (BID) | ORAL | 0 refills | Status: AC

## 2022-11-28 MED ORDER — OXYCODONE HCL 5 MG PO TABS
20.0000 mg | ORAL_TABLET | ORAL | Status: DC | PRN
  Administered 2022-11-28: 20 mg via ORAL
  Filled 2022-11-28: qty 4

## 2022-11-28 MED ORDER — OXYCODONE HCL 20 MG PO TABS: 20.0000 mg | ORAL_TABLET | ORAL | 0 refills | Status: AC | PRN

## 2022-11-28 MED ORDER — PANTOPRAZOLE SODIUM 40 MG PO TBEC: 40.0000 mg | DELAYED_RELEASE_TABLET | Freq: Two times a day (BID) | ORAL | Status: AC

## 2022-11-28 MED ORDER — SUCRALFATE 1 G PO TABS: 1.0000 g | ORAL_TABLET | Freq: Three times a day (TID) | ORAL | Status: AC

## 2022-11-28 NOTE — TOC Transition Note (Signed)
Transition of Care [facility]) - CM/SW Discharge Note   Patient Details  Name: Jamie [name] MRN: [id number] Date of Birth: Feb 14, 1956  [facility]) CM/SW Contact:  Delilah Shan, LCSWA Phone Number: 11/28/2022, 11:59 AM   Clinical Narrative:     Patient will [facility] to: [facility]   Anticipated DC date: 11/28/2022  Family notified: Chelsea  Transport by: Sharin Mons   ?  Per MD patient ready for DC to [facility] . RN, patient, patient's family, LVM with Brandi DSS APS worker, and facility notified of DC. Discharge Summary sent to facility. RN given number for report [phone] [id number]. DC packet on chart. DNR signed by MD attached to patients DC packet. Ambulance transport requested for patient.  CSW signing off.   Final next level of care: Skilled Nursing Facility Barriers to Discharge: No Barriers Identified   Patient Goals and CMS Choice Patient states their goals for this hospitalization and ongoing recovery are:: SNF CMS Medicare.gov Compare Post Acute Care list provided to:: Patient Choice offered to / list presented to : Patient  Discharge Placement              Patient chooses bed at:  [facility]) Patient to be transferred to facility by: [facility] Name of family member notified: Chelsea Patient and family notified of of transfer: 11/28/22  Discharge Plan and Services In-house Referral: Clinical Social Work                                   Social Determinants of Health (SDOH) Interventions     Readmission Risk Interventions    11/01/2022   12:39 PM 10/04/2022    3:42 PM 07/28/2022    1:55 PM  Readmission Risk Prevention Plan  Transportation Screening Complete Complete Complete  Medication Review Oceanographer) Complete Complete Complete  PCP or Specialist appointment within 3-5 days of discharge   Not Complete  HRI or Home Care Consult  Complete Complete  SW Recovery Care/Counseling Consult Complete Complete Complete  Palliative Care  Screening Not Applicable Not Applicable Not Applicable  Skilled Nursing Facility  Not Applicable Not Applicable

## 2022-11-28 NOTE — Progress Notes (Signed)
Physical Therapy Treatment Patient Details Name: Jamie [name] MRN: [id number] DOB: 1956-08-09 Today's Date: 11/28/2022   History of Present Illness Pt is a 66 y/o female who presents 10/31/2022 from home where she was found by EMS on her couch, covered in urine and feces, unable to get up. Pt called EMS due to SOB and abdominal pain. She was found to have a probable gastric ulcer. PMH significant for anxiety, gastroenteritis, ETOH abuse.    PT Comments    Pt continues to be limited and is self limited due to pain. Pt agreeable to mobilize initially and assist provided for pericare at start of session due to soiling self in bed. Pt able to roll Rt/Lt with use of bed rail. After completing bed mobility pt stating she is too fatigued and in too much pain to do more. Pt reporting she is due more pain meds but had been already been medicated this AM. She will benefit from SNF level rehab to improved mobility prior to return home. Will progress as able.   Recommendations for follow up therapy are one component of a multi-disciplinary discharge planning process, led by the attending physician.  Recommendations may be updated based on patient status, additional functional criteria and insurance authorization.  Follow Up Recommendations  Skilled nursing-short term rehab (<3 hours/day) Can patient physically be transported by private vehicle: Yes   Assistance Recommended at Discharge Frequent or constant Supervision/Assistance  Patient can return home with the following A little help with walking and/or transfers;A little help with bathing/dressing/bathroom;Assistance with cooking/housework;Assist for transportation;Help with stairs or ramp for entrance   Equipment Recommendations  None recommended by PT    Recommendations for Other Services       Precautions / Restrictions Precautions Precautions: Fall Precaution Comments: Premedicate for pain; frequent stools Restrictions Weight Bearing  Restrictions: No     Mobility  Bed Mobility Overal bed mobility: Needs Assistance Bed Mobility: Rolling Rolling: Supervision         General bed mobility comments: cues needed to initaite rolling Rt/Lt. pt has poor tolerance to Lt sidelying. pt declining any further activity after assist provided for pericare.    Transfers                        Ambulation/Gait                   Stairs             Wheelchair Mobility    Modified Rankin (Stroke Patients Only)       Balance                                            Cognition Arousal/Alertness: Awake/alert Behavior During Therapy: WFL for tasks assessed/performed Overall Cognitive Status: No family/caregiver present to determine baseline cognitive functioning                                          Exercises      General Comments        Pertinent Vitals/Pain Pain Assessment Pain Assessment: Faces Faces Pain Scale: Hurts little more Pain Intervention(s): Monitored during session, Repositioned    Home Living  Prior Function            PT Goals (current goals can now be found in the care plan section) Acute Rehab PT Goals Patient Stated Goal: Decrease pain PT Goal Formulation: With patient Time For Goal Achievement: 12/06/22 Potential to Achieve Goals: Fair Progress towards PT goals:  (self limiting)    Frequency    Min 2X/week      PT Plan Current plan remains appropriate    Co-evaluation              AM-PAC PT "6 Clicks" Mobility   Outcome Measure  Help needed turning from your back to your side while in a flat bed without using bedrails?: A Little Help needed moving from lying on your back to sitting on the side of a flat bed without using bedrails?: A Little Help needed moving to and from a bed to a chair (including a wheelchair)?: A Little Help needed standing up from a chair using  your arms (e.g., wheelchair or bedside chair)?: A Lot Help needed to walk in hospital room?: Total Help needed climbing 3-5 steps with a railing? : Total 6 Click Score: 13    End of Session   Activity Tolerance: Patient limited by pain Patient left: in bed;with bed alarm set;with call bell/phone within reach Nurse Communication: Mobility status;Patient requests pain meds PT Visit Diagnosis: Unsteadiness on feet (R26.81);Pain;Difficulty in walking, not elsewhere classified (R26.2);Muscle weakness (generalized) (M62.81)     Time: 1884-1660 PT Time Calculation (min) (ACUTE ONLY): 11 min  Charges:  $Therapeutic Activity: 8-22 mins                     Wynn Maudlin, DPT Acute Rehabilitation Services Office [phone]  11/28/22 12:40 PM

## 2022-11-28 NOTE — Discharge Summary (Addendum)
Physician Discharge Summary  Jamie [name] ZOX:[id number] DOB: Jul 22, 1956 DOA: 10/31/2022  PCP: Barbie Banner, MD  Admit date: 10/31/2022 Discharge date: 11/28/2022  Admitted From: Home Disposition:  SNF  Recommendations for Outpatient Follow-up:  Follow up with PCP in 1-2 weeks Please obtain BMP/CBC in one week Follow up with GI as an outpatient. Follow up with Surgery as an outpatient.   Home Health:No Equipment/Devices:None  Discharge Condition:Stable CODE STATUS:Full Diet recommendation: Heart Healthy  Brief/Interim Summary:  66 y.o. female past medical history of anxiety alcohol abuse in remission, chronic back pain, history of seizure disorder and discharged from the hospital on October 04, 2022 she was treated for sepsis secondary to pneumonia treated with pressors hospital stay was complicated by seizures comes in again for right upper quadrant abdominal pain that began on 10/29/2022 in the ED CT was done that showed free air in the abdomen, was found to be shock in the perforated viscus and peritonitis, surgery was consulted.  CT also showed intrahepatic and extrahepatic duct dilation with a CBD stone and pancreatic atrophy with pancreatic duct dilation she was started empirically on cefepime and Flagyl.  She has completed her course of antibiotics in house.   Discharge Diagnoses:  Principal Problem:   Peritonitis (HCC) Active Problems:   Seizure (HCC)   Sepsis due to undetermined organism (HCC)   Opiate dependence --Chronic pain   Sinus bradycardia   Pressure injury of skin   Acute gastric ulcer with perforation (HCC)   Choledocholithiasis  Septic shock secondary to peritonitis due to perforated peptic ulcers in the setting of BC powder use: Initially admitted under PCCM as she needed pressors due to her septic shock. General surgery and GI was consulted she was admitted under PCCM started on pressors.  Was eventually weaned off pressors started empiric antibiotics on  admission. GI and surgery as she was improving recommended a Gastrografin that showed no contrast extravasation. Multiple repeated CT scan of the abdomen pelvis shows slow improvement with no free air. She completed her course of antibiotics and how she was sent home on Protonix and sucralfate.  Acute perforated peptic ulcer disease: She was treated conservatively and she completed 14-day course of antibiotic she will follow-up with surgery as an outpatient. Cont PPI BID.  Right lower lobe pneumonia with new right-sided pleural effusion: After she completed 14-day treat for her initial sepsis episode she started developing right sided pleuritic pain and effusion was seen on chest x-ray IR was consulted thoracocentesis was performed. She was started empirically on antibiotic which she will continue as an outpatient she we were able to wean her to room air. Physical therapy evaluated the patient recommended skilled nursing facility.  Choledocholithiasis with biliary intra and extrahepatic ductal dilation: MRCP showed a stone measuring 4 mm with dilated gallbladder and pericystic fluid there was a concern for cholecystitis surgery and GI recommended conservative management as due to her recent perforated peptic ulcer disease will put her at high risk of decompensating and complications. She was able to tolerate her diet managed conservatively with antibiotics and she will follow-up with GI as an outpatient for possible ERCP. She will also follow-up with surgery as an outpatient as she will need a lap chole as an outpatient.  Pancreatic ductal dilation: Seen on CT similarly appearing on 2007 prior to these episodes. Repeated CT scan showed improvement of her pancreatic ductal dilation.  Acute on chronic blood loss anemia: No signs of overt bleeding. She was continued on PPI and Carafate  her hemoglobin remained stable.  History of seizures: Unprovoked continue Keppra orally.  Sinus  bradycardia: Resolved.  Opiate dependence: Continue current regimen no changes made.  Anxiety: Continue Xanax no changes made.  Hypokalemia: Repleted orally now resolved.  Low back pain after fall: CT scan of the lumbar spine and thoracic ordered showed no acute findings.  There was some thoracic kyphosis and L4-L5 anterolisthesis  Narcotic seeking behavior: Noted.  Moderate Protein caloric malnutrition: Ensure TID, Noted.  Sacral decubitus ulcer stage II present on admission: Noted.  Discharge Instructions  Discharge Instructions     Diet - low sodium heart healthy   Complete by: As directed    Increase activity slowly   Complete by: As directed    No wound care   Complete by: As directed       Allergies as of 11/28/2022       Reactions   Almond Oil Itching   Amoxicillin-pot Clavulanate Nausea And Vomiting, Other (See Comments)   Can tolerate plain Amoxicillin (??)   Azithromycin Nausea And Vomiting   Bupropion Nausea And Vomiting   Nsaids Other (See Comments)   Stomach problems   Prednisone Diarrhea   Vilazodone Diarrhea, Other (See Comments)   Viibryd        Medication List     STOP taking these medications    Gerhardt's butt cream Crea       TAKE these medications    acetaminophen 500 MG tablet Commonly known as: TYLENOL Take 2 tablets (1,000 mg total) by mouth every 8 (eight) hours as needed for mild pain or headache (or Fever >/= 101).   azithromycin 250 MG tablet Commonly known as: ZITHROMAX Take 1 tablet daily   cefdinir 300 MG capsule Commonly known as: OMNICEF Take 1 capsule (300 mg total) by mouth every 12 (twelve) hours for 4 days.   levETIRAcetam 500 MG tablet Commonly known as: KEPPRA Take 1 tablet (500 mg total) by mouth 2 (two) times daily.   loratadine 10 MG tablet Commonly known as: CLARITIN Take 1 tablet (10 mg total) by mouth daily. What changed:  when to take this reasons to take this   Oxycodone HCl 20 MG  Tabs Take 1 tablet (20 mg total) by mouth every 4 (four) hours as needed for up to 7 days for breakthrough pain. What changed:  medication strength how much to take when to take this reasons to take this   pantoprazole 40 MG tablet Commonly known as: PROTONIX Take 1 tablet (40 mg total) by mouth daily. What changed:  when to take this reasons to take this   pantoprazole 40 MG tablet Commonly known as: Protonix Take 1 tablet (40 mg total) by mouth daily. What changed: You were already taking a medication with the same name, and this prescription was added. Make sure you understand how and when to take each.   pantoprazole 40 MG tablet Commonly known as: PROTONIX Take 1 tablet (40 mg total) by mouth 2 (two) times daily. What changed: You were already taking a medication with the same name, and this prescription was added. Make sure you understand how and when to take each.   sucralfate 1 g tablet Commonly known as: CARAFATE Take 1 tablet (1 g total) by mouth 4 (four) times daily -  with meals and at bedtime.   tiZANidine 4 MG tablet Commonly known as: ZANAFLEX Take 4 mg by mouth 3 (three) times daily.   traZODone 100 MG tablet Commonly known as: DESYREL Take  1 tablet (100 mg total) by mouth at bedtime as needed for sleep.        Contact information for follow-up providers     Franky Macho, MD. Call.   Specialty: General Surgery Why: For follow up Contact information: [address] [facility] [address] [phone]              Contact information for after-discharge care     Destination     [facility] Preferred SNF .   Service: Skilled Nursing Contact information: [address] [place] [address] [phone]                    Allergies  Allergen Reactions   Almond Oil Itching   Amoxicillin-Pot Clavulanate Nausea And Vomiting and Other (See Comments)    Can tolerate plain Amoxicillin  (??)   Azithromycin Nausea And Vomiting   Bupropion Nausea And Vomiting   Nsaids Other (See Comments)    Stomach problems   Prednisone Diarrhea   Vilazodone Diarrhea and Other (See Comments)    Viibryd    Consultations: Gastroenterology Pulmonary and critical care General surgery Palliative care   Procedures/Studies: US THORACENTESIS ASP PLEURAL SPACE W/IMG GUIDE  Result Date: 11/26/2022 INDICATION: Sepsis secondary to pneumonia with right pleural effusion. Request for diagnostic and therapeutic thoracentesis. EXAM: ULTRASOUND GUIDED RIGHT THORACENTESIS MEDICATIONS: None. COMPLICATIONS: None immediate. PROCEDURE: An ultrasound guided thoracentesis was thoroughly discussed with the patient and questions answered. The benefits, risks, alternatives and complications were also discussed. The patient understands and wishes to proceed with the procedure. Written consent was obtained. Ultrasound was performed to localize and mark an adequate pock 1% lidocaine 10 mL et of fluid in the right chest. The area was then prepped and draped in the normal sterile fashion. 1% Lidocaine was used for local anesthesia. Under ultrasound guidance a 6 Fr Safe-T-Centesis catheter was introduced. Thoracentesis was performed. The catheter was removed and a dressing applied. FINDINGS: A total of approximately 1.7 L of clear yellow fluid was removed. Samples were sent to the laboratory as requested by the clinical team. IMPRESSION: Successful ultrasound guided right thoracentesis yielding 1.7 L of pleural fluid. No pneumothorax on post-procedure chest x-ray. Procedure performed by: Corrin Parker, PA-C Electronically Signed   By: Olive Bass M.D.   On: 11/26/2022 12:09   DG Chest 1 View  Result Date: 11/26/2022 CLINICAL DATA:  66 year old female status post right thoracentesis. EXAM: CHEST  1 VIEW COMPARISON:  Chest x-ray 11/25/2022. FINDINGS: Previously noted right-sided pleural effusion has decreased in size, currently  small. No appreciable postprocedural pneumothorax identified. Persistent opacity in the base of the right lung which may reflect atelectasis and/or consolidation in the right lower lobe. Minimal subsegmental atelectasis or scarring in the periphery of the left lung base. No evidence of pulmonary edema. Heart size is normal. Upper mediastinal contours are within normal limits. IMPRESSION: 1. Decreased size of what is now a small right pleural effusion following thoracentesis. Persistent atelectasis and/or consolidation in the right lower lobe. Electronically Signed   By: Trudie Reed M.D.   On: 11/26/2022 10:30   DG Chest Port 1 View  Result Date: 11/25/2022 CLINICAL DATA:  Sepsis EXAM: PORTABLE CHEST 1 VIEW COMPARISON:  11/18/2022 FINDINGS: Cardiac shadow is stable. Aortic calcifications are again seen. Left lung is clear. New large right-sided pleural effusion is noted. No pneumothorax is noted. IMPRESSION: New large right-sided pleural effusion. Electronically Signed   By: Alcide Clever M.D.   On:  11/25/2022 01:01   CT ABDOMEN PELVIS W WO CONTRAST  Result Date: 11/24/2022 CLINICAL DATA:  Biliary obstruction EXAM: CT ABDOMEN AND PELVIS WITHOUT AND WITH CONTRAST TECHNIQUE: Multidetector CT imaging of the abdomen and pelvis was performed following the standard protocol before and following the bolus administration of intravenous contrast. RADIATION DOSE REDUCTION: This exam was performed according to the departmental dose-optimization program which includes automated exposure control, adjustment of the mA and/or kV according to patient size and/or use of iterative reconstruction technique. CONTRAST:  75mL OMNIPAQUE IOHEXOL 350 MG/ML SOLN COMPARISON:  CT 11/09/2022 FINDINGS: Lower chest: Large RIGHT pleural effusion increased from comparison exam. Dense atelectasis the RIGHT lower lobe Hepatobiliary: Common bile duct measures 6 mm decreased from 9 mm on prior. There is persistent soft tissue density in the  distal common bile duct (image 43/8). No intrahepatic biliary duct dilatation. Gallbladder normal. Pancreas: Pancreas is normal. No ductal dilatation. No pancreatic inflammation. Spleen: Normal spleen Adrenals/urinary tract: Adrenal glands and kidneys are normal. The ureters and bladder normal. Stomach/Bowel: The stomach, duodenum, and small bowel normal. Moderate volume stool throughout the colon. Obstructing lesion. Vascular/Lymphatic: Abdominal aorta is normal caliber with atherosclerotic calcification. There is no retroperitoneal or periportal lymphadenopathy. No pelvic lymphadenopathy. Reproductive: Post hysterectomy.  Adnexa unremarkable Other: No free fluid. Gas in the ventral subcutaneous abdominal wall related to subcu injections. Musculoskeletal: Posterior lumbar fusion.  No complication. IMPRESSION: 1. Dilatation of the common bile duct is slightly reduced from comparison exam. Persistent density in the distal common bile duct. No intrahepatic biliary duct dilatation. 2. Increasing RIGHT pleural effusion with dense RIGHT lobe atelectasis. 3. No bowel obstruction. 4. Moderate volume stool throughout colon. Electronically Signed   By: Genevive Bi M.D.   On: 11/24/2022 08:16   DG Ribs Unilateral W/Chest Right  Result Date: 11/18/2022 CLINICAL DATA:  Right rib pain after fall. EXAM: RIGHT RIBS AND CHEST - 3+ VIEW COMPARISON:  October 31, 2022. FINDINGS: No acute fracture or other bone lesions are seen involving the ribs. Mild bibasilar subsegmental atelectasis is noted. Small right pleural effusion is noted. No definite pneumothorax is noted. Heart size and mediastinal contours are within normal limits. IMPRESSION: No acute abnormality seen involving the right ribs. Mild bibasilar subsegmental atelectasis is noted with small right pleural effusion. Electronically Signed   By: Lupita Raider M.D.   On: 11/18/2022 10:21   CT LUMBAR SPINE WO CONTRAST  Result Date: 11/13/2022 CLINICAL DATA:  Fall  with increased fracture risk. EXAM: CT THORACIC AND LUMBAR SPINE WITHOUT CONTRAST TECHNIQUE: Multidetector CT imaging of the thoracic and lumbar spine was performed without contrast. Multiplanar CT image reconstructions were also generated. RADIATION DOSE REDUCTION: This exam was performed according to the departmental dose-optimization program which includes automated exposure control, adjustment of the mA and/or kV according to patient size and/or use of iterative reconstruction technique. COMPARISON:  None Available. FINDINGS: CT THORACIC SPINE FINDINGS Alignment: No traumatic malalignment. Exaggerated thoracic kyphosis. Vertebrae: Generalized osteopenia. No acute fracture or incidental bone lesion. Paraspinal and other soft tissues: Negative for perispinal mass or inflammation. Bands of atelectasis at the lung bases with small pleural effusions. Biapical emphysema. Disc levels: Generalized thoracic degenerative disc narrowing and endplate irregularity. No bony impingement. CT LUMBAR SPINE FINDINGS Segmentation: 5 lumbar type vertebrae Alignment: Mild L4-5 anterolisthesis, facet mediated Vertebrae: No acute fracture or focal pathologic process. Solid L5-S1 fusion. Paraspinal and other soft tissues: Atheromatous plaque. No perispinal inflammatory or masslike finding Disc levels: Advanced facet degeneration at L4-5 with anterolisthesis.  The disc is narrowed and bulging with mild bilateral foraminal narrowing. Notable L3-4 foraminal disc bulging as well. IMPRESSION: 1. No acute fracture or traumatic malalignment 2. Generalized thoracic disc degeneration with thoracic kyphosis and L4-5 anterolisthesis. 3. L5-S1 solid fusion. Electronically Signed   By: Tiburcio Pea M.D.   On: 11/13/2022 12:44   CT THORACIC SPINE WO CONTRAST  Result Date: 11/13/2022 CLINICAL DATA:  Fall with increased fracture risk. EXAM: CT THORACIC AND LUMBAR SPINE WITHOUT CONTRAST TECHNIQUE: Multidetector CT imaging of the thoracic and  lumbar spine was performed without contrast. Multiplanar CT image reconstructions were also generated. RADIATION DOSE REDUCTION: This exam was performed according to the departmental dose-optimization program which includes automated exposure control, adjustment of the mA and/or kV according to patient size and/or use of iterative reconstruction technique. COMPARISON:  None Available. FINDINGS: CT THORACIC SPINE FINDINGS Alignment: No traumatic malalignment. Exaggerated thoracic kyphosis. Vertebrae: Generalized osteopenia. No acute fracture or incidental bone lesion. Paraspinal and other soft tissues: Negative for perispinal mass or inflammation. Bands of atelectasis at the lung bases with small pleural effusions. Biapical emphysema. Disc levels: Generalized thoracic degenerative disc narrowing and endplate irregularity. No bony impingement. CT LUMBAR SPINE FINDINGS Segmentation: 5 lumbar type vertebrae Alignment: Mild L4-5 anterolisthesis, facet mediated Vertebrae: No acute fracture or focal pathologic process. Solid L5-S1 fusion. Paraspinal and other soft tissues: Atheromatous plaque. No perispinal inflammatory or masslike finding Disc levels: Advanced facet degeneration at L4-5 with anterolisthesis. The disc is narrowed and bulging with mild bilateral foraminal narrowing. Notable L3-4 foraminal disc bulging as well. IMPRESSION: 1. No acute fracture or traumatic malalignment 2. Generalized thoracic disc degeneration with thoracic kyphosis and L4-5 anterolisthesis. 3. L5-S1 solid fusion. Electronically Signed   By: Tiburcio Pea M.D.   On: 11/13/2022 12:44   CT ABDOMEN PELVIS W CONTRAST  Result Date: 11/09/2022 CLINICAL DATA:  Epigastric pain EXAM: CT ABDOMEN AND PELVIS WITH CONTRAST TECHNIQUE: Multidetector CT imaging of the abdomen and pelvis was performed using the standard protocol following bolus administration of intravenous contrast. RADIATION DOSE REDUCTION: This exam was performed according to the  departmental dose-optimization program which includes automated exposure control, adjustment of the mA and/or kV according to patient size and/or use of iterative reconstruction technique. CONTRAST:  75mL OMNIPAQUE IOHEXOL 350 MG/ML SOLN COMPARISON:  Previous study done on 11/05/2022 FINDINGS: Lower chest: Small bilateral pleural effusions are seen, more so on the right side. There are infiltrates in both lower lung fields suggesting atelectasis/pneumonia. Hepatobiliary: There is slight prominence of intrahepatic bile ducts. Common bile duct measures 9 mm in diameter. In image 28 of series 4, there is 5 mm radiopacity in the lumen of distal common bile duct close to the ampulla. Gallbladder is not distended. There is no wall thickening in gallbladder. Pancreas: There is prominence of pancreatic duct. No new focal abnormalities are seen. Spleen: Spleen measures 13 cm in maximum diameter. Adrenals/Urinary Tract: Adrenals are unremarkable. There is no hydronephrosis. There are no renal or ureteral stones. There is 11 mm smooth marginated low-density structure in the upper pole of left kidney suggesting possible renal cyst. No follow-up imaging is recommended. Urinary bladder is unremarkable. Stomach/Bowel: Stomach is not distended. Wall thickening seen in the antrum of the stomach in the previous examination is not evident in the current study. There is no significant small bowel dilation. Oral contrast has reached the rectum. Appendix is not distinctly seen. There is no pericecal inflammation. There is no significant wall thickening in colon. Vascular/Lymphatic: Scattered arterial calcifications are  seen. Reproductive: Uterus appears smaller than usual. There are no adnexal masses. Other: There is almost complete interval clearing of ascites. There is trace amount of fluid in perihepatic region. There is minimal stranding in the fat planes posterior to the rectum with interval decrease. This may be residual part of  anasarca. There is interval increase in amount of subcutaneous fluid in abdominal wall suggesting anasarca. Musculoskeletal: There is laminectomy and fusion at L5-S1 level. Schmorl's node is seen in the upper endplate of body of T12 vertebra. There is first-degree spondylolisthesis at L4-L5 level. IMPRESSION: There is dilation of bile ducts. There is 5 mm radiopacity in the lumen of distal common bile duct close to the ampulla. Findings suggest possible choledocholithiasis. There are no signs of acute cholecystitis. Follow-up MRCP as clinically warranted may be considered. Asymmetric wall thickening in the antrum of the stomach seen in the previous study is not evident in the current study. There is almost complete interval clearing of ascites. There is no evidence of intestinal obstruction or pneumoperitoneum. There is no hydronephrosis. Small bilateral pleural effusions. There are linear infiltrates in the lower lung fields suggesting atelectasis. There is interval increase in amount of subcutaneous edema suggesting anasarca. Enlarged spleen.  Small left renal cyst.  Lumbar spondylosis. Other findings as described in the body of the report. Electronically Signed   By: Ernie Avena M.D.   On: 11/09/2022 16:52   DG UGI W SINGLE CM (SOL OR THIN BA)  Result Date: 11/07/2022 CLINICAL DATA:  Evaluation for perforated gastric ulcer. Exam is limited due to patient's immobility. EXAM: DG UGI W SINGLE CM TECHNIQUE: Scout radiograph was obtained. Single contrast examination was performed using thin liquid barium. This exam was performed by Corrin Parker, PA-C, and was supervised and interpreted by Caprice Renshaw, MD. FLUOROSCOPY: Radiation Exposure Index (as provided by the fluoroscopic device): 16.3 mGy Kerma COMPARISON:  CT abdomen and pelvis done November 05, 2022 FINDINGS: Limited upper GI exam due to patient condition. Scout radiograph demonstrates no evidence of bowel obstruction and prior lumbosacral fusion.  Normal appearance of the distal esophagus. No gastroesophageal reflux occurred during the exam. No hiatal hernia. There is no evidence of extraluminal contrast leak. Normal appearance of the duodenum. IMPRESSION: Limited upper GI exam due to patient condition. No evidence of extraluminal contrast leak on limited views. Procedure performed by: Corrin Parker, PA-C Electronically Signed   By: Caprice Renshaw M.D.   On: 11/07/2022 09:57   CT ABDOMEN PELVIS W CONTRAST  Result Date: 11/05/2022 CLINICAL DATA:  Acute abdominal pain, free intraperitoneal gas. Gastric perforation EXAM: CT ABDOMEN AND PELVIS WITH CONTRAST TECHNIQUE: Multidetector CT imaging of the abdomen and pelvis was performed using the standard protocol following bolus administration of intravenous contrast. RADIATION DOSE REDUCTION: This exam was performed according to the departmental dose-optimization program which includes automated exposure control, adjustment of the mA and/or kV according to patient size and/or use of iterative reconstruction technique. CONTRAST:  59mL OMNIPAQUE IOHEXOL 350 MG/ML SOLN COMPARISON:  10/31/2022 FINDINGS: Lower chest: Small bilateral pleural effusions have developed with associated bibasilar compressive atelectasis. Cardiac size within normal limits. No pericardial effusion. Hepatobiliary: The liver is unremarkable. Previously noted intra and extrahepatic biliary ductal dilation has resolved. The gallbladder is unremarkable. Previously noted gallbladder distension has resolved. Pancreas: Borderline enlargement of the main pancreatic duct is again identified to the level of the ampulla where this measures 5 mm in diameter. No superimposed peripancreatic inflammatory changes are identified. No discrete pancreatic mass is seen. Spleen:  Unremarkable Adrenals/Urinary Tract: Adrenal glands are unremarkable. Kidneys are normal, without renal calculi, focal lesion, or hydronephrosis. Bladder is unremarkable. Stomach/Bowel: Mild  ascites has developed. Asymmetric wall thickening involving the gastric antrum anteriorly, best appreciated at axial image # 27/3 and sagittal image # [id number] is again identified raising the question of a focal inflammatory process as can be seen peptic ulcer disease. Previously noted punctate perigastric free intraperitoneal gas has resolved. No evidence of obstruction. The small and large bowel are unremarkable save for mild sigmoid diverticulosis. The appendix is unremarkable. Vascular/Lymphatic: Mild aortoiliac atherosclerotic calcification. No aortic aneurysm. No pathologic adenopathy within the abdomen and pelvis. Reproductive: Uterus and bilateral adnexa are unremarkable. Other: Moderate diffuse subcutaneous body wall edema and mild infiltration within the retroperitoneum extending into the pelvis in keeping with retroperitoneal edema is present in keeping with changes of developing anasarca. Tiny fat containing umbilical hernia again noted. Musculoskeletal: L5-S1 posterior lumbar fusion and decompression has been performed. Osseous structures are otherwise age-appropriate. IMPRESSION: 1. Interval development of small bilateral pleural effusions, mild ascites, and retroperitoneal edema in keeping with developing anasarca. 2. Asymmetric wall thickening involving the gastric antrum anteriorly raising the question of a focal inflammatory process as can be seen with peptic ulcer disease. Previously noted punctate perigastric free intraperitoneal gas has resolved. Mild ascites has developed, however, though this may simply represent a component of developing anasarca. Correlation with endoscopy may be helpful for further evaluation. 3. Interval resolution of intra and extrahepatic biliary ductal dilation and gallbladder distension. 4. Stable borderline enlargement of the main pancreatic duct is again identified to the level of the ampulla, better assessed on prior MRI examination of 11/01/2022. No superimposed  peripancreatic inflammatory changes are identified. 5. Mild sigmoid diverticulosis. 6.  Aortic Atherosclerosis (ICD10-I70.0). Electronically Signed   By: Helyn Numbers M.D.   On: 11/05/2022 19:57   MR ABDOMEN MRCP W WO CONTAST  Result Date: 11/01/2022 CLINICAL DATA:  Cholelithiasis, choledocholithiasis EXAM: MRI ABDOMEN WITHOUT AND WITH CONTRAST (INCLUDING MRCP) TECHNIQUE: Multiplanar multisequence MR imaging of the abdomen was performed both before and after the administration of intravenous contrast. Heavily T2-weighted images of the biliary and pancreatic ducts were obtained, and three-dimensional MRCP images were rendered by post processing. CONTRAST:  7mL GADAVIST GADOBUTROL 1 MMOL/ML IV SOLN COMPARISON:  Multiple priors including CT October 31, 2022 and May 08, 2016 FINDINGS: Despite efforts by the technologist and patient, motion artifact is present on today's exam and could not be eliminated. This reduces exam sensitivity and specificity. Lower chest: Small bilateral pleural effusions with consolidation in the right lower lobe atelectasis versus infiltrate. Hepatobiliary: No significant hepatic steatosis. No suspicious hepatic lesion. Sludge in a distended gallbladder with pericholecystic fluid. Dilated common duct measures 12 mm with 2 small stones in the mid duct measuring up to 4 mm on image 19/6 and 15/4. Pancreas: Prominence of the pancreatic duct measuring up to 2.5 mm in the pancreatic body on image 14/6, difficult to evaluate given the motion degrading on this examination but appearing similar dating back to 2017. Pancreatic atrophy is similar prior. Spleen:  No splenomegaly. Adrenals/Urinary Tract: Bilateral adrenal glands appear normal. Hydronephrosis. No solid enhancing hepatic lesion. Stomach/Bowel: No pathologic dilation of bowel. Vascular/Lymphatic: Normal caliber abdominal aorta. No pathologically enlarged abdominal lymph nodes. Other: Trace abdominopelvic free fluid with mesenteric  stranding. Body wall edema. Musculoskeletal: Posterior lumbar fixation hardware. IMPRESSION: Motion degraded examination.  Within this context: 1. Dilated common duct measuring 12 mm with 2 small stones in the mid duct  measuring up to 4 mm. 2. Sludge in a dilated gallbladder with pericholecystic fluid, equivocal for acute cholecystitis. Consider further evaluation with nuclear medicine HIDA scan. 3. Prominence of the pancreatic duct measuring up to 2.5 mm in the pancreatic body, difficult to evaluate given the motion degrading on this examination but appearing similar dating back to 2017, relative stability is reassuring and depth may be within normal limits for this patient. However, given given that this is incompletely evaluated/characterized on this examination would suggest follow-up nonemergent MRCP with and without intravenous contrast preferably as an outpatient upon resolution of patient's current symptomatology when they are better able to follow commands including breath hold. 4. Small bilateral pleural effusions with consolidation in the right lower lobe atelectasis versus infiltrate. 5. Trace abdominopelvic free fluid with mesenteric stranding and subcutaneous edema. Electronically Signed   By: Maudry Mayhew M.D.   On: 11/01/2022 14:38   MR 3D Recon At Scanner  Result Date: 11/01/2022 CLINICAL DATA:  Cholelithiasis, choledocholithiasis EXAM: MRI ABDOMEN WITHOUT AND WITH CONTRAST (INCLUDING MRCP) TECHNIQUE: Multiplanar multisequence MR imaging of the abdomen was performed both before and after the administration of intravenous contrast. Heavily T2-weighted images of the biliary and pancreatic ducts were obtained, and three-dimensional MRCP images were rendered by post processing. CONTRAST:  7mL GADAVIST GADOBUTROL 1 MMOL/ML IV SOLN COMPARISON:  Multiple priors including CT October 31, 2022 and May 08, 2016 FINDINGS: Despite efforts by the technologist and patient, motion artifact is present on  today's exam and could not be eliminated. This reduces exam sensitivity and specificity. Lower chest: Small bilateral pleural effusions with consolidation in the right lower lobe atelectasis versus infiltrate. Hepatobiliary: No significant hepatic steatosis. No suspicious hepatic lesion. Sludge in a distended gallbladder with pericholecystic fluid. Dilated common duct measures 12 mm with 2 small stones in the mid duct measuring up to 4 mm on image 19/6 and 15/4. Pancreas: Prominence of the pancreatic duct measuring up to 2.5 mm in the pancreatic body on image 14/6, difficult to evaluate given the motion degrading on this examination but appearing similar dating back to 2017. Pancreatic atrophy is similar prior. Spleen:  No splenomegaly. Adrenals/Urinary Tract: Bilateral adrenal glands appear normal. Hydronephrosis. No solid enhancing hepatic lesion. Stomach/Bowel: No pathologic dilation of bowel. Vascular/Lymphatic: Normal caliber abdominal aorta. No pathologically enlarged abdominal lymph nodes. Other: Trace abdominopelvic free fluid with mesenteric stranding. Body wall edema. Musculoskeletal: Posterior lumbar fixation hardware. IMPRESSION: Motion degraded examination.  Within this context: 1. Dilated common duct measuring 12 mm with 2 small stones in the mid duct measuring up to 4 mm. 2. Sludge in a dilated gallbladder with pericholecystic fluid, equivocal for acute cholecystitis. Consider further evaluation with nuclear medicine HIDA scan. 3. Prominence of the pancreatic duct measuring up to 2.5 mm in the pancreatic body, difficult to evaluate given the motion degrading on this examination but appearing similar dating back to 2017, relative stability is reassuring and depth may be within normal limits for this patient. However, given given that this is incompletely evaluated/characterized on this examination would suggest follow-up nonemergent MRCP with and without intravenous contrast preferably as an outpatient  upon resolution of patient's current symptomatology when they are better able to follow commands including breath hold. 4. Small bilateral pleural effusions with consolidation in the right lower lobe atelectasis versus infiltrate. 5. Trace abdominopelvic free fluid with mesenteric stranding and subcutaneous edema. Electronically Signed   By: Maudry Mayhew M.D.   On: 11/01/2022 14:38   DG Chest Portable 1 View  Result Date: 10/31/2022 CLINICAL DATA:  Central line placement EXAM: PORTABLE CHEST 1 VIEW COMPARISON:  Radiographs 10/03/2022 FINDINGS: New left IJ CVC tip in the low SVC. Remainder unchanged from 10/03/2022. Scarring/atelectasis left lung base. Elevated left hemidiaphragm. Stable cardiomediastinal silhouette. Aortic atherosclerotic calcification. No acute osseous abnormality. No pleural effusion or pneumothorax. IMPRESSION: Left IJ CVC tip in the low SVC. Electronically Signed   By: Minerva Fester M.D.   On: 10/31/2022 21:54   [place] EKG SITE RITE  Result Date: 10/31/2022 If Site Rite image not attached, placement could not be confirmed due to current cardiac rhythm.  CT ABDOMEN PELVIS W CONTRAST  Result Date: 10/31/2022 CLINICAL DATA:  Sepsis. EXAM: CT ABDOMEN AND PELVIS WITH CONTRAST TECHNIQUE: Multidetector CT imaging of the abdomen and pelvis was performed using the standard protocol following bolus administration of intravenous contrast. RADIATION DOSE REDUCTION: This exam was performed according to the departmental dose-optimization program which includes automated exposure control, adjustment of the mA and/or kV according to patient size and/or use of iterative reconstruction technique. CONTRAST:  14mL OMNIPAQUE IOHEXOL 300 MG/ML  SOLN COMPARISON:  Noncontrast CT 09/29/2021 FINDINGS: Lower chest: Dependent atelectasis with trace pleural thickening in the lung bases. Hepatobiliary: Focal fatty infiltration adjacent to the falciform ligament. No evidence of discrete intrahepatic lesion. There  is marked intra and extrahepatic biliary ductal dilatation. The common bile duct measures 17 mm. The gallbladder is moderately distended. No calcified gallstone. There is a 4 mm rounded noncalcified density in the distal common bile duct series 2, image 35 suspicious for choledocholithiasis. Pancreas: Pancreatic atrophy. Pancreatic ductal dilatation of 5 mm. There is no obvious obstructing pancreatic mass. Question small pancreatic cyst or side branch lesion series 2, image 30. Spleen: The spleen is enlarged spanning 15 cm cranial caudal. No focal abnormalities. Adrenals/Urinary Tract: Normal adrenal glands. No hydronephrosis or perinephric edema. Homogeneous renal enhancement with symmetric excretion on delayed phase imaging. Small cyst in the upper left kidney. No further follow-up imaging is needed. No visualized renal calculi or solid renal lesion. Urinary bladder is physiologically distended without wall thickening. Stomach/Bowel: There are foci of free air in the upper abdomen most consistent with perforated viscus, for example series 2, image 22. There is moderate wall thickening about the gastric cardia which is ill-defined. Moderate wall thickening involves the distal gastric body is well. Occasional fluid-filled loops of small bowel in the right abdomen, but no evidence of small-bowel obstruction. There is no small bowel pneumatosis or discrete small bowel inflammation to suggest small bowel as site of perforation. The appendix is tentatively visualized and normal series 6, image 33. Formed stool in the ascending and splenic flexure of the colon. The distal descending and sigmoid colon are nondistended. Equivocal wall thickening versus nondistention. Few sigmoid colonic diverticula, but no focal diverticulitis as cause for perforation. Vascular/Lymphatic: Aortic atherosclerosis without aneurysm. Patent portal, splenic and superior mesenteric veins. No portal venous or mesenteric gas. Limited assessment for  adenopathy on the current exam, no obvious enlarged lymph nodes. Reproductive: Retroverted uterus.  No adnexal mass. Other: Small foci of free air in the anterior upper abdomen, series 2, image 21. Small fat containing supraumbilical ventral abdominal wall hernia. There is no evidence of discrete abdominopelvic collection. Musculoskeletal: Posterior L5-S1 fusion hardware. Grade 1 anterolisthesis of L4 on L5 with prominent facet hypertrophy. There are no acute or suspicious osseous abnormalities. IMPRESSION: 1. Foci of free air in the upper abdomen most consistent with perforated viscus. Exact source is difficult to delineate, however there  is wall thickening about both the gastric cardia as well as distal stomach suggesting gastric or upper GI source of perforation. 2. Marked intra and extrahepatic biliary ductal dilatation. There is a 4 mm rounded noncalcified density in the distal common bile duct suspicious for choledocholithiasis. However there is also pancreatic ductal dilatation and atrophy. Possibility of a CT occult obstructing pancreatic lesion is considered. Recommend further assessment with MRI after resolution of acute event. 3. Equivocal wall thickening versus nondistention of the distal descending and sigmoid colon. Few sigmoid colonic diverticula, but no focal diverticulitis as cause for perforation. 4. Splenomegaly. 5. Small fat containing supraumbilical ventral abdominal wall hernia. Aortic Atherosclerosis (ICD10-I70.0). Critical Value/emergent results were called by telephone at the time of interpretation on 10/31/2022 at 4:07 pm to provider Edilia Bo , who verbally acknowledged these results. Electronically Signed   By: Narda Rutherford M.D.   On: 10/31/2022 16:08   (Echo, Carotid, EGD, Colonoscopy, ERCP)    Subjective:   Discharge Exam: Vitals:   11/28/22 0647 11/28/22 0752  BP: (!) 146/72 (!) 160/85  Pulse: 81 86  Resp: 18 16  Temp: 98.5 F (36.9 C) 98.2 F (36.8 C)  SpO2: 98% 96%    Vitals:   11/27/22 1530 11/27/22 2046 11/28/22 0647 11/28/22 0752  BP: (!) 141/71 (!) 165/64 (!) 146/72 (!) 160/85  Pulse: 91 87 81 86  Resp: Temp: 99.3 F (37.4 C) 99 F (37.2 C) 98.5 F (36.9 C) 98.2 F (36.8 C)  TempSrc: Oral Oral Oral Oral  SpO2: 94% 95% 98% 96%  Weight:      Height:        General: Pt is alert, awake, not in acute distress Cardiovascular: RRR, S1/S2 +, no rubs, no gallops Respiratory: CTA bilaterally, no wheezing, no rhonchi Abdominal: Soft, NT, ND, bowel sounds + Extremities: no edema, no cyanosis    The results of significant diagnostics from this hospitalization (including imaging, microbiology, ancillary and laboratory) are listed below for reference.     Microbiology: Recent Results (from the past 240 hour(s))  Culture, blood (Routine X 2) w Reflex to ID Panel     Status: None (Preliminary result)   Collection Time: 11/25/22 10:55 AM   Specimen: BLOOD  Result Value Ref Range Status   Specimen Description BLOOD LEFT ANTECUBITAL  Final   Special Requests   Final    BOTTLES DRAWN AEROBIC AND ANAEROBIC Blood Culture adequate volume   Culture   Final    NO GROWTH 2 DAYS Performed at [facility], [address], [place], [place] [address]    Report Status PENDING  Incomplete  Culture, blood (Routine X 2) w Reflex to ID Panel     Status: None (Preliminary result)   Collection Time: 11/25/22 10:55 AM   Specimen: BLOOD LEFT HAND  Result Value Ref Range Status   Specimen Description BLOOD LEFT HAND  Final   Special Requests   Final    BOTTLES DRAWN AEROBIC AND ANAEROBIC Blood Culture adequate volume   Culture   Final    NO GROWTH 2 DAYS Performed at [facility], [address] [place], [place] [address]    Report Status PENDING  Incomplete  Gram stain     Status: None   Collection Time: 11/26/22 10:04 AM   Specimen: Pleura  Result Value Ref Range Status   Specimen Description PLEURAL  Final   Special Requests  NONE  Final   Gram Stain  Final    MODERATE WBC PRESENT, PREDOMINANTLY PMN NO ORGANISMS SEEN Performed at [facility], [address], [place], [place] [address]    Report Status 11/26/2022 FINAL  Final  Culture, body fluid w Gram Stain-bottle     Status: None (Preliminary result)   Collection Time: 11/26/22 10:04 AM   Specimen: Pleura  Result Value Ref Range Status   Specimen Description PLEURAL  Final   Special Requests NONE  Final   Culture   Final    NO GROWTH < 24 HOURS Performed at [facility], [address] [place], [place] [address]    Report Status PENDING  Incomplete     Labs: BNP (last 3 results) Recent Labs    10/03/22 0209  BNP 1,280.2*   Basic Metabolic Panel: Recent Labs  Lab 11/22/22 1544 11/24/22 1751 11/25/22 0346  NA 138 135 135  K 3.6 3.3* 3.3*  CL 104 103 103  CO2 25 21* 22  GLUCOSE 101* 99 93  BUN 5* 7* 8  CREATININE 0.67 0.76 0.75  CALCIUM 9.3 9.4 9.4   Liver Function Tests: Recent Labs  Lab 11/22/22 1544 11/23/22 0903 11/25/22 0346  AST 15 12* 13*  ALT 11 9 7   ALKPHOS 123 102 104  BILITOT 0.6 0.2* 0.2*  PROT 5.9* 5.5* 6.5  ALBUMIN 2.5* 2.3* 2.6*   No results for input(s): "LIPASE", "AMYLASE" in the last 168 hours. No results for input(s): "AMMONIA" in the last 168 hours. CBC: Recent Labs  Lab 11/22/22 1544 11/23/22 0903 11/24/22 1751 11/25/22 0634  WBC 9.5 9.3 10.8* 8.5  NEUTROABS  --  6.0 7.4 6.2  HGB 8.8* 7.9* 9.0* 7.7*  HCT 28.9* 27.1* 30.7* 25.2*  MCV 79.6* 81.6 80.2 78.3*  PLT 447* 421* 497* 392   Cardiac Enzymes: No results for input(s): "CKTOTAL", "CKMB", "CKMBINDEX", "TROPONINI" in the last 168 hours. BNP: Invalid input(s): "POCBNP" CBG: No results for input(s): "GLUCAP" in the last 168 hours. D-Dimer No results for input(s): "DDIMER" in the last 72 hours. Hgb A1c No results for input(s): "HGBA1C" in the last 72 hours. Lipid Profile No results for input(s): "CHOL", "HDL", "LDLCALC",  "TRIG", "CHOLHDL", "LDLDIRECT" in the last 72 hours. Thyroid function studies No results for input(s): "TSH", "T4TOTAL", "T3FREE", "THYROIDAB" in the last 72 hours.  Invalid input(s): "FREET3" Anemia work up No results for input(s): "VITAMINB12", "FOLATE", "FERRITIN", "TIBC", "IRON", "RETICCTPCT" in the last 72 hours. Urinalysis    Component Value Date/Time   COLORURINE YELLOW 11/25/2022 0558   APPEARANCEUR CLOUDY (A) 11/25/2022 0558   LABSPEC 1.020 11/25/2022 0558   PHURINE 5.0 11/25/2022 0558   GLUCOSEU NEGATIVE 11/25/2022 0558   HGBUR SMALL (A) 11/25/2022 0558   BILIRUBINUR NEGATIVE 11/25/2022 0558   KETONESUR 5 (A) 11/25/2022 0558   PROTEINUR 30 (A) 11/25/2022 0558   NITRITE NEGATIVE 11/25/2022 0558   LEUKOCYTESUR SMALL (A) 11/25/2022 0558   Sepsis Labs Recent Labs  Lab 11/22/22 1544 11/23/22 0903 11/24/22 1751 11/25/22 0634  WBC 9.5 9.3 10.8* 8.5   Microbiology Recent Results (from the past 240 hour(s))  Culture, blood (Routine X 2) w Reflex to ID Panel     Status: None (Preliminary result)   Collection Time: 11/25/22 10:55 AM   Specimen: BLOOD  Result Value Ref Range Status   Specimen Description BLOOD LEFT ANTECUBITAL  Final   Special Requests   Final    BOTTLES DRAWN AEROBIC AND ANAEROBIC Blood Culture adequate volume   Culture   Final  NO GROWTH 2 DAYS Performed at [facility], [address] [place], [place] [address]    Report Status PENDING  Incomplete  Culture, blood (Routine X 2) w Reflex to ID Panel     Status: None (Preliminary result)   Collection Time: 11/25/22 10:55 AM   Specimen: BLOOD LEFT HAND  Result Value Ref Range Status   Specimen Description BLOOD LEFT HAND  Final   Special Requests   Final    BOTTLES DRAWN AEROBIC AND ANAEROBIC Blood Culture adequate volume   Culture   Final    NO GROWTH 2 DAYS Performed at [facility], [address], [place], [place] [address]    Report Status PENDING  Incomplete  Gram stain      Status: None   Collection Time: 11/26/22 10:04 AM   Specimen: Pleura  Result Value Ref Range Status   Specimen Description PLEURAL  Final   Special Requests NONE  Final   Gram Stain   Final    MODERATE WBC PRESENT, PREDOMINANTLY PMN NO ORGANISMS SEEN Performed at [facility], [address] [place], [place] [address]    Report Status 11/26/2022 FINAL  Final  Culture, body fluid w Gram Stain-bottle     Status: None (Preliminary result)   Collection Time: 11/26/22 10:04 AM   Specimen: Pleura  Result Value Ref Range Status   Specimen Description PLEURAL  Final   Special Requests NONE  Final   Culture   Final    NO GROWTH < 24 HOURS Performed at [facility], [address], [place], [place] [address]    Report Status PENDING  Incomplete     Time coordinating discharge: Over 30 minutes  SIGNED:   Marinda Elk, MD  [facility] 11/28/2022, 8:42 AM Pager   If 7PM-7AM, please contact night-coverage [email or web address] Password [email or web address]

## 2022-11-28 NOTE — TOC Progression Note (Addendum)
Transition of Care [facility]) - Progression Note    Patient Details  Name: Jamie [name] MRN: [id number] Date of Birth: [birth year]  [facility] CM/SW Contact  Delilah Shan, LCSWA Phone Number: 11/28/2022, 11:53 AM  Clinical Narrative:     Whitney with [facility] confirmed they can accept patient today if medically ready. Whitney with [facility] confirmed they received insurance authorization for patient. Patient gave permission for CSW to contact her daughter Leeroy Bock to let her know she is going to dc today to [facility]. CSW will continue to follow and assist with patients dc planning needs.   Expected Discharge Plan: Home w Home Health Services Barriers to Discharge: No Barriers Identified  Expected Discharge Plan and Services Expected Discharge Plan: Home w Home Health Services In-house Referral: Clinical Social Work     Living arrangements for the past 2 months: Single Family Home Expected Discharge Date: 11/28/22                                     Social Determinants of Health (SDOH) Interventions    Readmission Risk Interventions    11/01/2022   12:39 PM 10/04/2022    3:42 PM 07/28/2022    1:55 PM  Readmission Risk Prevention Plan  Transportation Screening Complete Complete Complete  Medication Review Oceanographer) Complete Complete Complete  PCP or Specialist appointment within 3-5 days of discharge   Not Complete  HRI or Home Care Consult  Complete Complete  SW Recovery Care/Counseling Consult Complete Complete Complete  Palliative Care Screening Not Applicable Not Applicable Not Applicable  Skilled Nursing Facility  Not Applicable Not Applicable

## 2022-11-28 NOTE — Progress Notes (Signed)
This nurse again attempt to call f to give report no answer. Informed EMT that they did not answer, place my name and number on the AVS if they have any question.  IV remove and intact. EMS to transport patient to [facility].  Gave pt alprazolam at 1420.    AVS printed and given to the EMT to give to the nurse at [facility], attempt to call again at 1455, no answer.

## 2022-11-28 NOTE — Progress Notes (Signed)
This nurse has attempt to call [facility] twice to give report.

## 2022-11-28 NOTE — Care Management Important Message (Signed)
Important Message  Patient Details  Name: Jamie [name] MRN: [id number] Date of Birth: 11-02-1956   Medicare Important Message Given:  Yes     Sherilyn Banker 11/28/2022, 1:08 PM

## 2022-11-29 ENCOUNTER — Ambulatory Visit: Payer: Medicare HMO | Admitting: Internal Medicine

## 2022-11-29 LAB — CYTOLOGY - NON PAP

## 2022-11-30 LAB — CULTURE, BLOOD (ROUTINE X 2)
Culture: NO GROWTH
Culture: NO GROWTH
Special Requests: ADEQUATE
Special Requests: ADEQUATE

## 2022-12-01 LAB — CULTURE, BODY FLUID W GRAM STAIN -BOTTLE: Culture: NO GROWTH

## 2022-12-08 ENCOUNTER — Ambulatory Visit: Payer: Medicare HMO | Admitting: General Surgery

## 2022-12-08 LAB — MISC LABCORP TEST (SEND OUT): Labcorp test code: 9985

## 2023-01-18 ENCOUNTER — Ambulatory Visit: Payer: Medicare HMO | Admitting: Internal Medicine

## 2023-02-20 ENCOUNTER — Telehealth: Payer: Self-pay | Admitting: Internal Medicine

## 2023-02-20 ENCOUNTER — Ambulatory Visit: Payer: Medicare HMO | Admitting: Internal Medicine

## 2023-02-20 NOTE — Telephone Encounter (Signed)
Left message for pt to call back.  Pt never returned the phone call. Appt rescheduled for 03/28/23 at 9am with Dr. Henrene Pastor. Appt letter mailed to pt.

## 2023-02-20 NOTE — Telephone Encounter (Signed)
Inbound call from patient stating she needed to reschedule her appointment with Dr. Henrene Pastor for today at 3:20 because she is sick and has a fever. Patients appointment was canceled and patient was offered next available with Dr. Henrene Pastor and APP. Patient stated she cannot wait that long and requested to speak with nurse. Please advise.

## 2023-03-28 ENCOUNTER — Ambulatory Visit: Payer: Medicare HMO | Admitting: Internal Medicine

## 2023-05-31 ENCOUNTER — Ambulatory Visit: Payer: Self-pay | Admitting: Internal Medicine

## 2023-11-14 IMAGING — DX DG FOREARM 2V*R*
2 series · 2 of 2 positions shown · non-contrast
Comparison: 05/02/2021

CLINICAL DATA: Pain after a fall [REDACTED].

EXAM:
RIGHT FOREARM - 2 VIEW

[forearm ap]
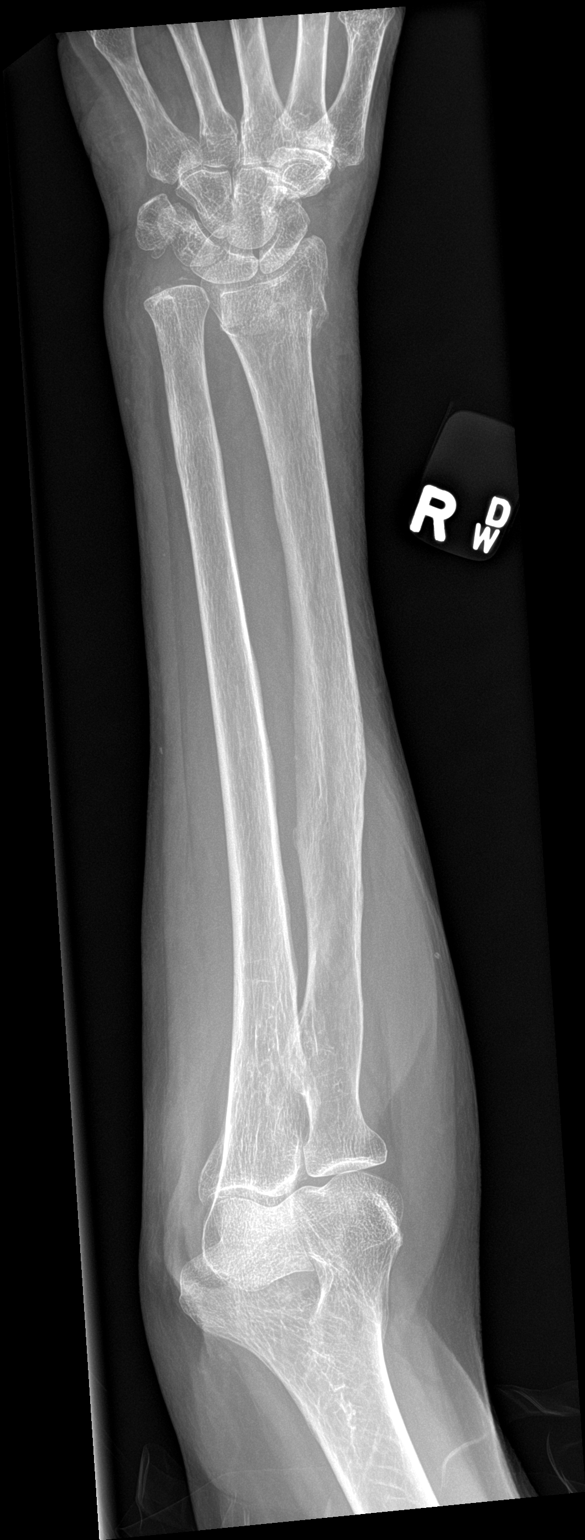

[forearm lat]
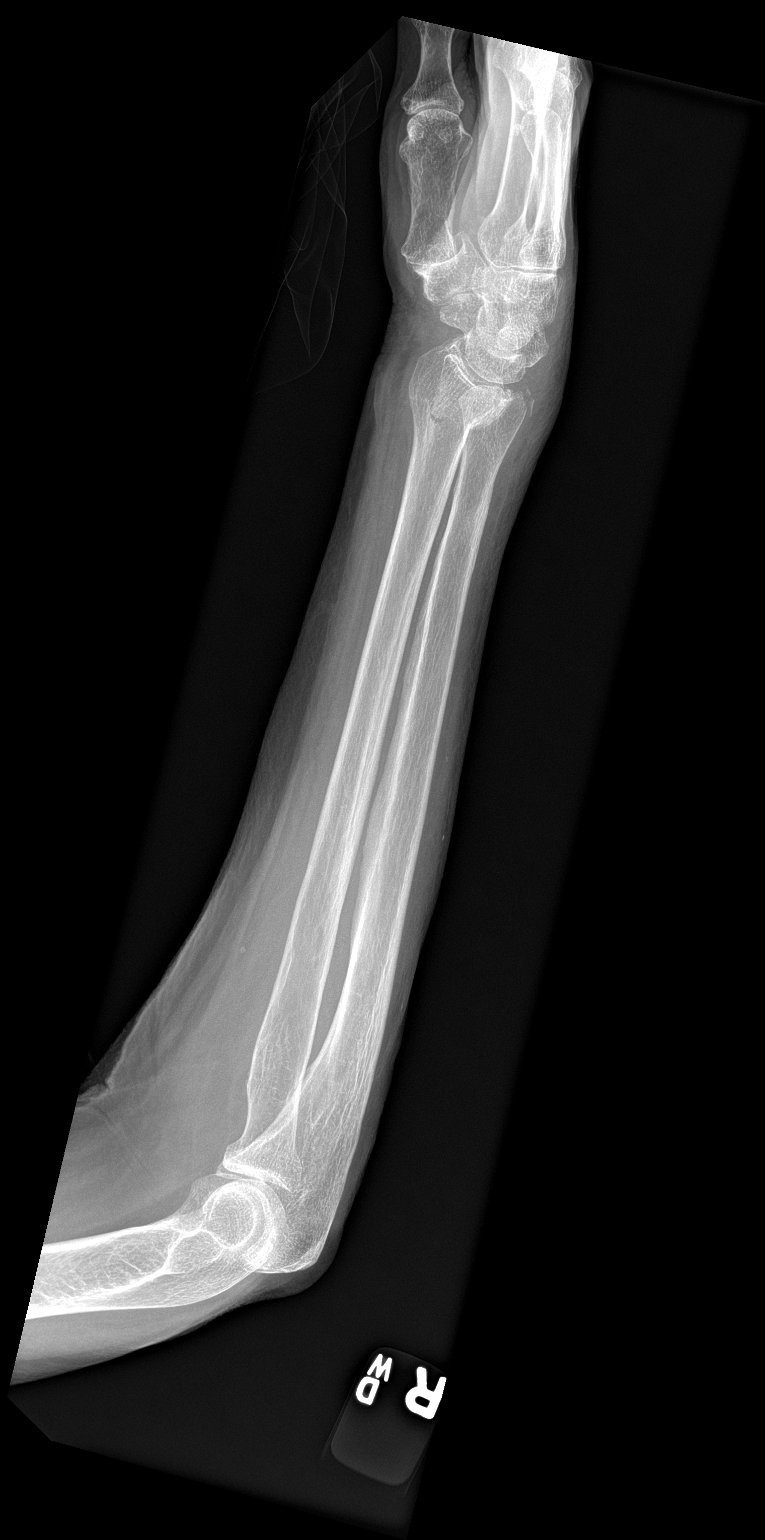

[2 of 2 positions shown; findings below may reference images not displayed]

FINDINGS: Acute comminuted fractures of the distal right radius with extension
to the articular surface. Fractures involve the radiocarpal and
radioulnar joints. Dorsal angulation and impaction of the distal
fracture fragments. Mildly displaced fracture of the ulnar styloid
process. Calcification in the triangular fibrocartilage.
Degenerative changes in the carpus. Diffuse soft tissue swelling
about the wrist.
IMPRESSION: Acute fractures of the distal right radius and ulna with dorsal
angulation of the distal fracture fragments. Soft tissue swelling.

## 2023-11-14 IMAGING — DX DG WRIST 2V*R*
2 series · 2 of 2 positions shown · non-contrast
Comparison: Right forearm x-ray 03/26/2022.

CLINICAL DATA: Post reduction.

EXAM:
RIGHT WRIST - 2 VIEW

[wrist ap]
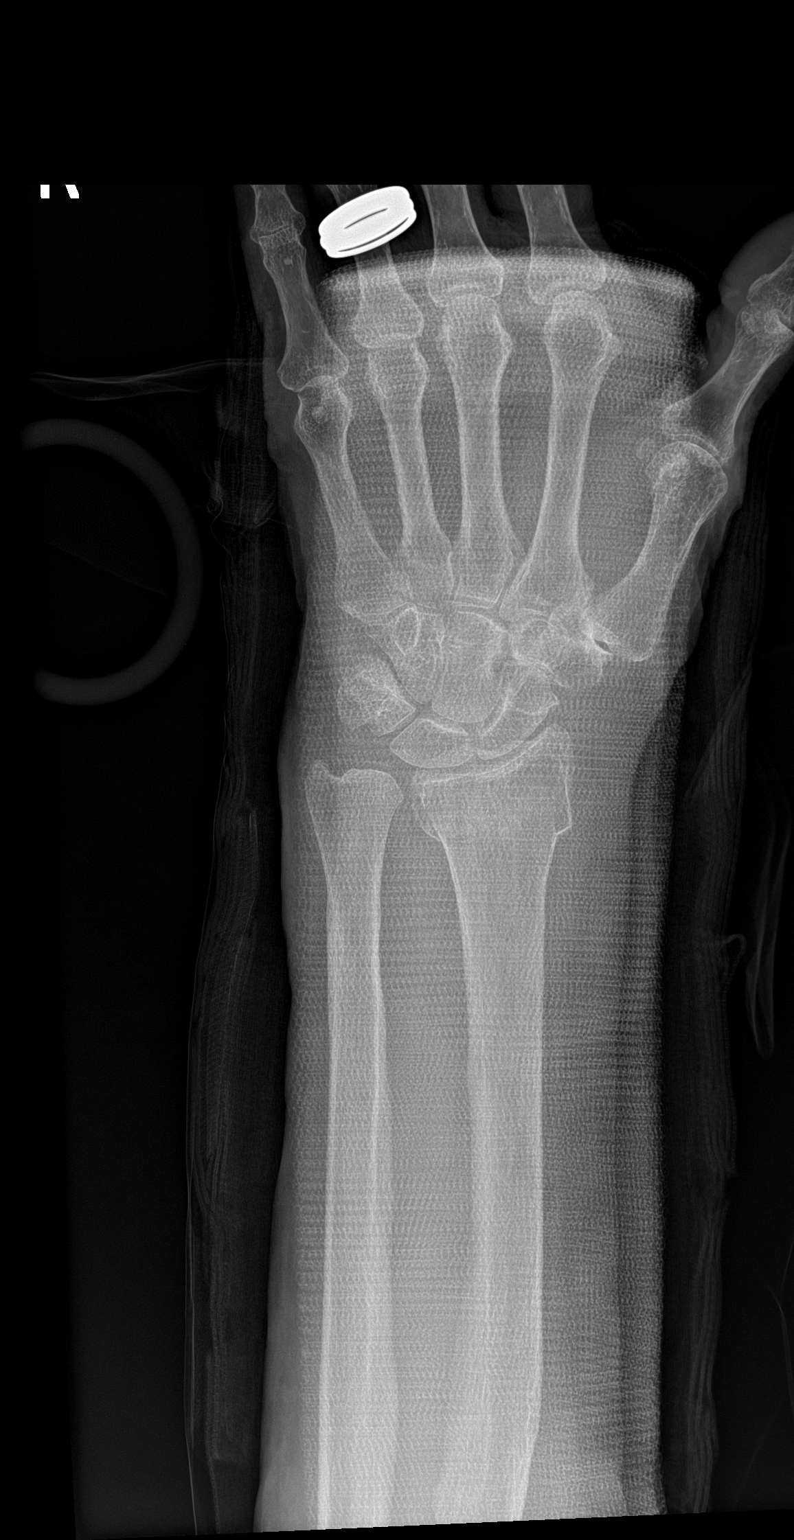

[wrist lat]
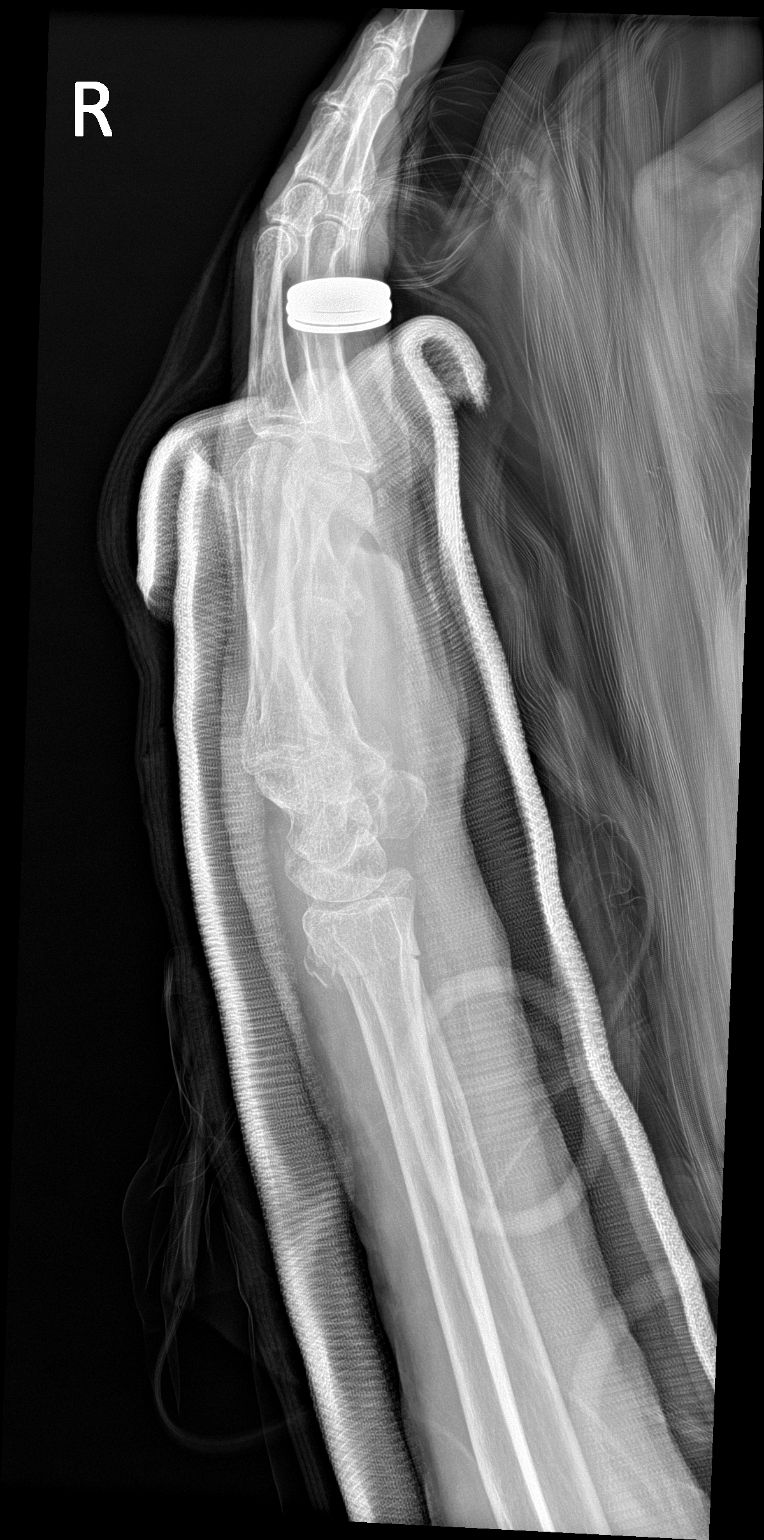

[2 of 2 positions shown; findings below may reference images not displayed]

FINDINGS: New overlying cast obscures fine bony detail. Distal radius fracture
is in anatomic alignment. No dislocation. There is soft tissue
swelling surrounding the wrist.
IMPRESSION: 1. Distal radius fracture is in anatomic alignment.

## 2023-11-14 IMAGING — DX DG KNEE COMPLETE 4+V*R*
4 series · 4 of 4 positions shown · non-contrast
Comparison: 05/02/2021

CLINICAL DATA: Pain after a fall [REDACTED].

EXAM:
RIGHT KNEE - COMPLETE 4+ VIEW

[knee ap]
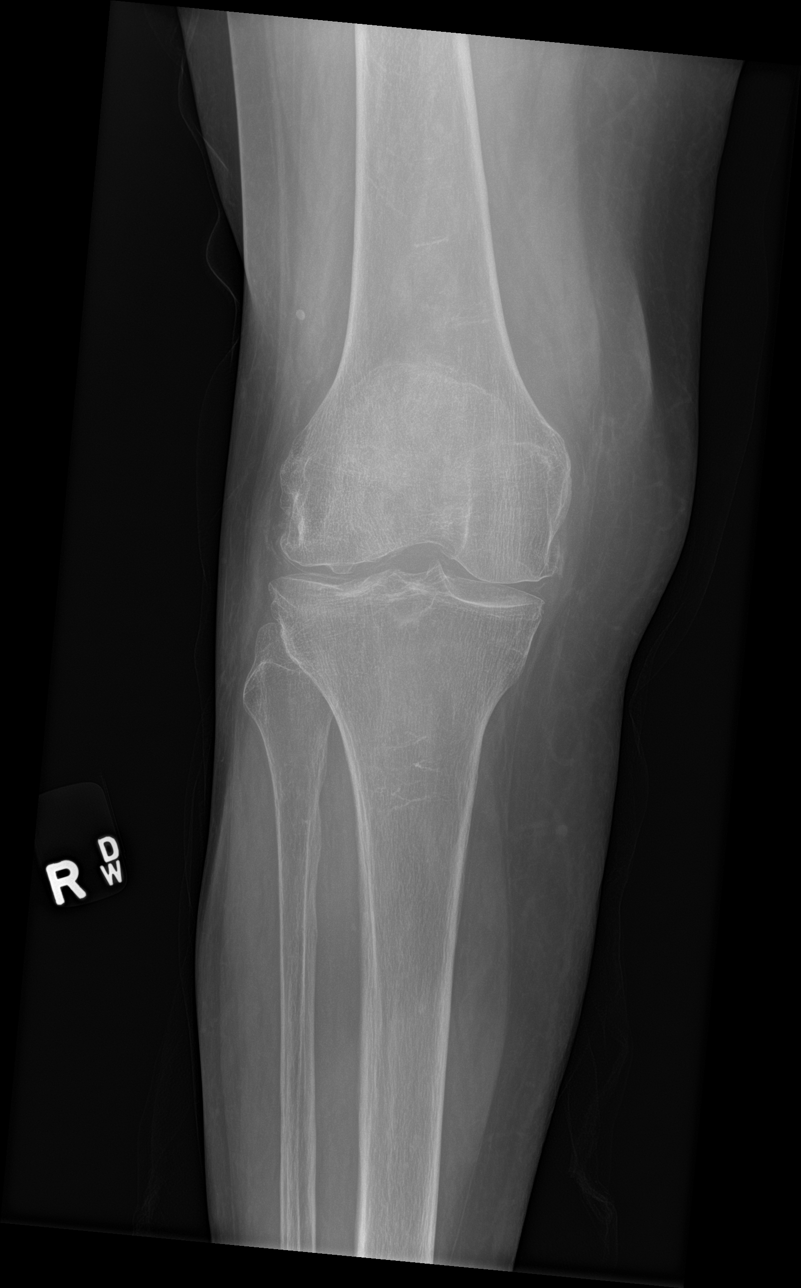

[knee lat]
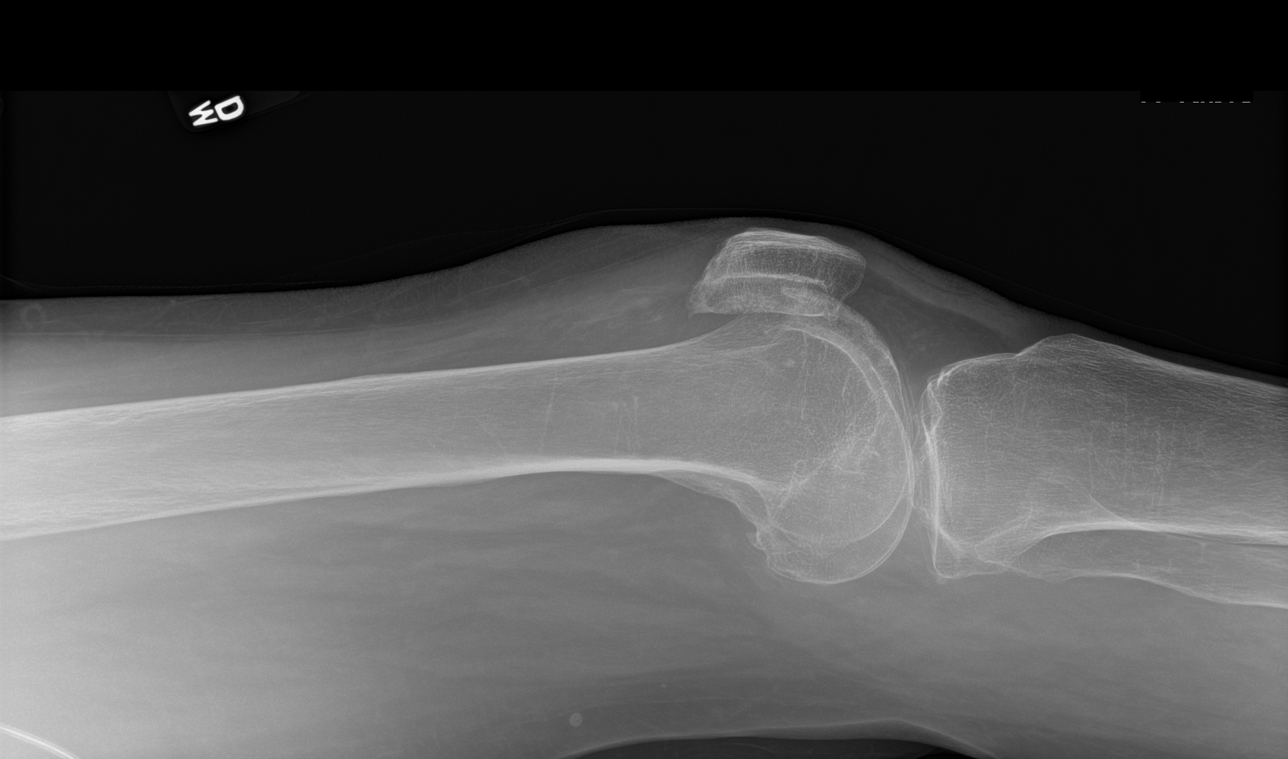

[knee obl (1 of 2)]
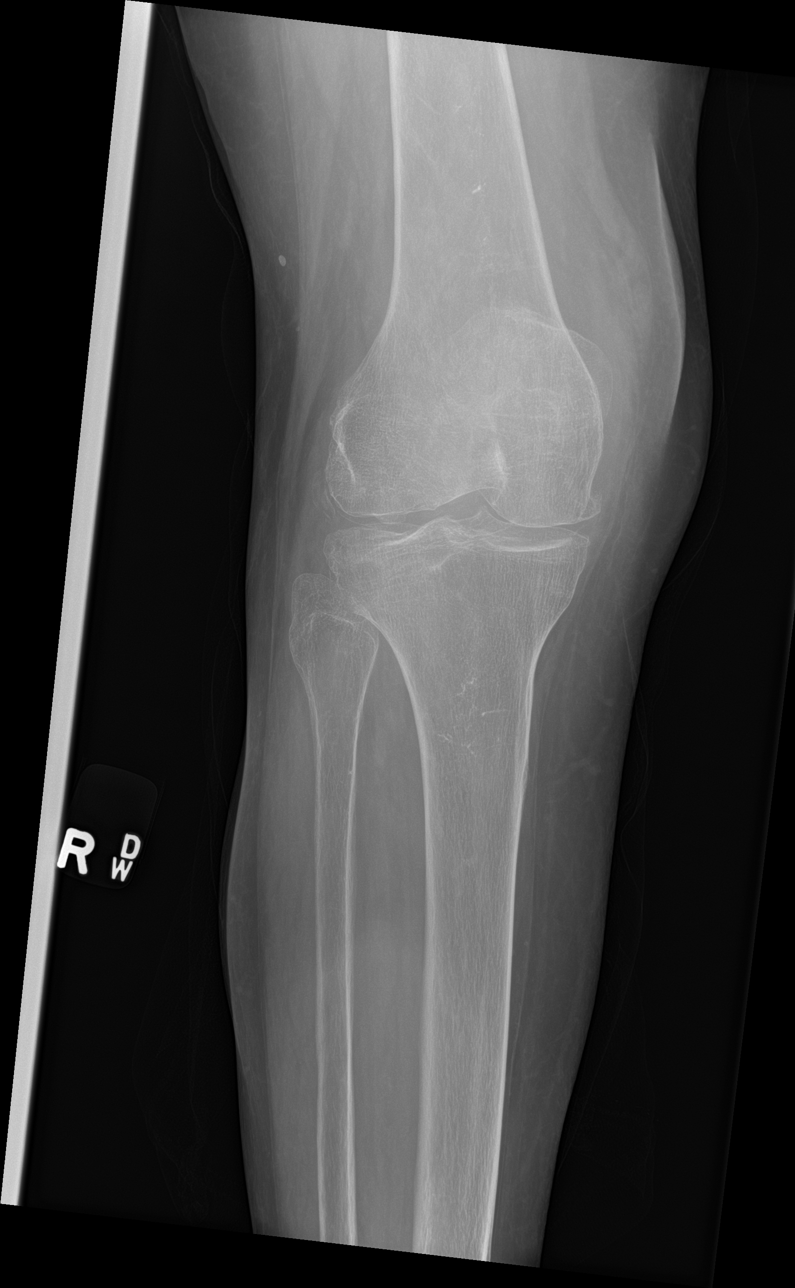

[knee obl (2 of 2)]
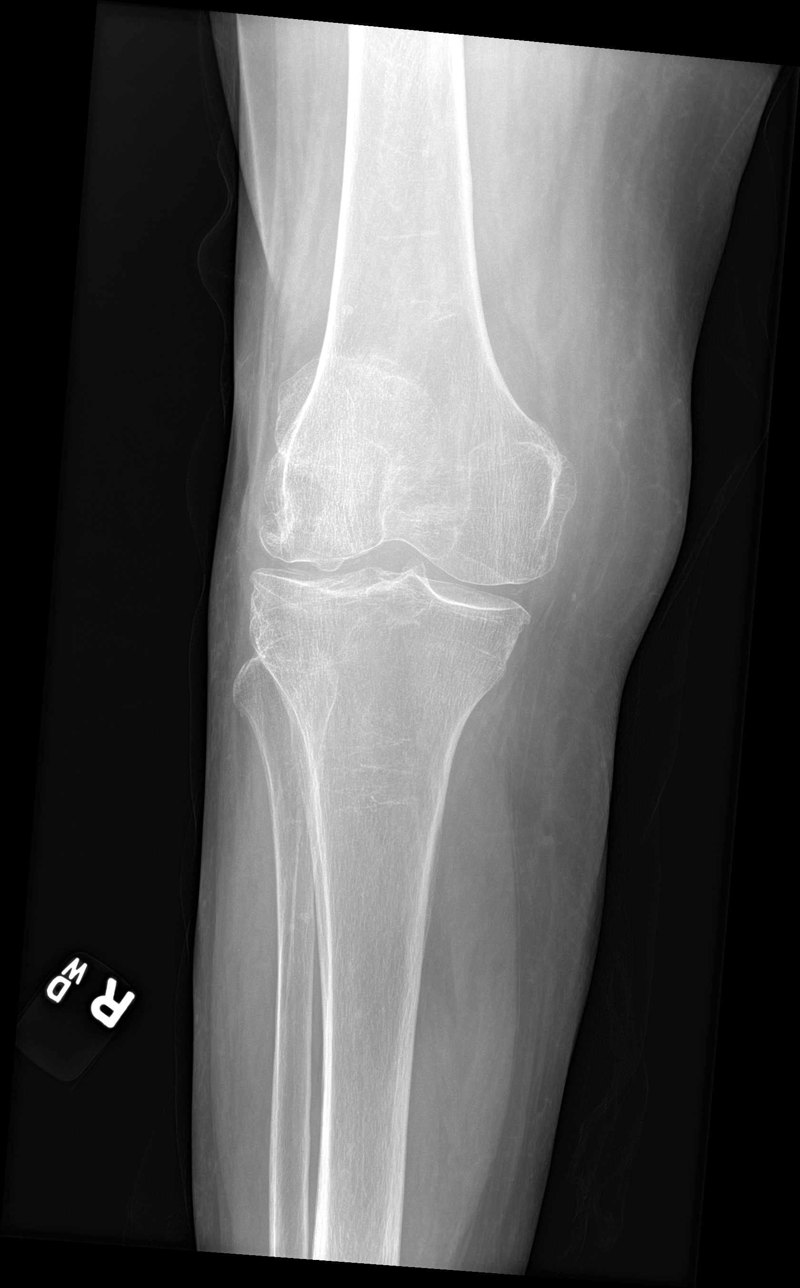

[4 of 4 positions shown; findings below may reference images not displayed]

FINDINGS: Degenerative changes in the right knee involving all 3 compartments.
Lateral greater than medial compartment narrowing with
tricompartment osteophyte formation. Cartilaginous calcification. No
evidence of acute fracture or dislocation. No focal bone lesion or
bone destruction. No significant effusions. Mild vascular
calcifications.
IMPRESSION: Tricompartment degenerative changes in the right knee. No acute
displaced fractures identified.
